# Patient Record
Sex: Male | Born: 1985 | Race: Black or African American | Hispanic: No | Marital: Married | Smoking: Current every day smoker
Health system: Southern US, Community
[De-identification: ages and names within clinical notes are randomized; demographics above are authoritative.]

## PROBLEM LIST (undated history)

## (undated) DIAGNOSIS — C8190 Hodgkin lymphoma, unspecified, unspecified site: Secondary | ICD-10-CM

## (undated) DIAGNOSIS — Z720 Tobacco use: Secondary | ICD-10-CM

## (undated) DIAGNOSIS — G8929 Other chronic pain: Secondary | ICD-10-CM

## (undated) DIAGNOSIS — Z86711 Personal history of pulmonary embolism: Secondary | ICD-10-CM

## (undated) DIAGNOSIS — F1010 Alcohol abuse, uncomplicated: Secondary | ICD-10-CM

## (undated) DIAGNOSIS — M4802 Spinal stenosis, cervical region: Secondary | ICD-10-CM

## (undated) DIAGNOSIS — M545 Low back pain, unspecified: Secondary | ICD-10-CM

## (undated) HISTORY — DX: Hodgkin lymphoma, unspecified, unspecified site: C81.90

## (undated) HISTORY — DX: Tobacco use: Z72.0

## (undated) MED FILL — Iron Sucrose Inj 20 MG/ML (Fe Equiv): INTRAVENOUS | Qty: 15 | Status: AC

## (undated) NOTE — *Deleted (*Deleted)
Doxorubicin injection °What is this medicine? °DOXORUBICIN (dox oh ROO bi sin) is a chemotherapy drug. It is used to treat many kinds of cancer like leukemia, lymphoma, neuroblastoma, sarcoma, and Wilms' tumor. It is also used to treat bladder cancer, breast cancer, lung cancer, ovarian cancer, stomach cancer, and thyroid cancer. °This medicine may be used for other purposes; ask your health care provider or pharmacist if you have questions. °COMMON BRAND NAME(S): Adriamycin, Adriamycin PFS, Adriamycin RDF, Rubex °What should I tell my health care provider before I take this medicine? °They need to know if you have any of these conditions: °· heart disease °· history of low blood counts caused by a medicine °· liver disease °· recent or ongoing radiation therapy °· an unusual or allergic reaction to doxorubicin, other chemotherapy agents, other medicines, foods, dyes, or preservatives °· pregnant or trying to get pregnant °· breast-feeding °How should I use this medicine? °This drug is given as an infusion into a vein. It is administered in a hospital or clinic by a specially trained health care professional. If you have pain, swelling, burning or any unusual feeling around the site of your injection, tell your health care professional right away. °Talk to your pediatrician regarding the use of this medicine in children. Special care may be needed. °Overdosage: If you think you have taken too much of this medicine contact a poison control center or emergency room at once. °NOTE: This medicine is only for you. Do not share this medicine with others. °What if I miss a dose? °It is important not to miss your dose. Call your doctor or health care professional if you are unable to keep an appointment. °What may interact with this medicine? °This medicine may interact with the following medications: °· 6-mercaptopurine °· paclitaxel °· phenytoin °· St. John's Wort °· trastuzumab °· verapamil °This list may not describe  all possible interactions. Give your health care provider a list of all the medicines, herbs, non-prescription drugs, or dietary supplements you use. Also tell them if you smoke, drink alcohol, or use illegal drugs. Some items may interact with your medicine. °What should I watch for while using this medicine? °This drug may make you feel generally unwell. This is not uncommon, as chemotherapy can affect healthy cells as well as cancer cells. Report any side effects. Continue your course of treatment even though you feel ill unless your doctor tells you to stop. °There is a maximum amount of this medicine you should receive throughout your life. The amount depends on the medical condition being treated and your overall health. Your doctor will watch how much of this medicine you receive in your lifetime. Tell your doctor if you have taken this medicine before. °You may need blood work done while you are taking this medicine. °Your urine may turn red for a few days after your dose. This is not blood. If your urine is dark or brown, call your doctor. °In some cases, you may be given additional medicines to help with side effects. Follow all directions for their use. °Call your doctor or health care professional for advice if you get a fever, chills or sore throat, or other symptoms of a cold or flu. Do not treat yourself. This drug decreases your body's ability to fight infections. Try to avoid being around people who are sick. °This medicine may increase your risk to bruise or bleed. Call your doctor or health care professional if you notice any unusual bleeding. °Talk to your doctor   about your risk of cancer. You may be more at risk for certain types of cancers if you take this medicine. °Do not become pregnant while taking this medicine or for 6 months after stopping it. Women should inform their doctor if they wish to become pregnant or think they might be pregnant. Men should not father a child while taking this  medicine and for 6 months after stopping it. There is a potential for serious side effects to an unborn child. Talk to your health care professional or pharmacist for more information. Do not breast-feed an infant while taking this medicine. °This medicine has caused ovarian failure in some women and reduced sperm counts in some men This medicine may interfere with the ability to have a child. Talk with your doctor or health care professional if you are concerned about your fertility. °This medicine may cause a decrease in Co-Enzyme Q-10. You should make sure that you get enough Co-Enzyme Q-10 while you are taking this medicine. Discuss the foods you eat and the vitamins you take with your health care professional. °What side effects may I notice from receiving this medicine? °Side effects that you should report to your doctor or health care professional as soon as possible: °· allergic reactions like skin rash, itching or hives, swelling of the face, lips, or tongue °· breathing problems °· chest pain °· fast or irregular heartbeat °· low blood counts - this medicine may decrease the number of white blood cells, red blood cells and platelets. You may be at increased risk for infections and bleeding. °· pain, redness, or irritation at site where injected °· signs of infection - fever or chills, cough, sore throat, pain or difficulty passing urine °· signs of decreased platelets or bleeding - bruising, pinpoint red spots on the skin, black, tarry stools, blood in the urine °· swelling of the ankles, feet, hands °· tiredness °· weakness °Side effects that usually do not require medical attention (report to your doctor or health care professional if they continue or are bothersome): °· diarrhea °· hair loss °· mouth sores °· nail discoloration or damage °· nausea °· red colored urine °· vomiting °This list may not describe all possible side effects. Call your doctor for medical advice about side effects. You may report  side effects to FDA at 1-800-FDA-1088. °Where should I keep my medicine? °This drug is given in a hospital or clinic and will not be stored at home. °NOTE: This sheet is a summary. It may not cover all possible information. If you have questions about this medicine, talk to your doctor, pharmacist, or health care provider. °© 2020 [employer] (2017-03-12 11:01:26) °Bleomycin injection °What is this medicine? °BLEOMYCIN (blee oh MYE sin) is a chemotherapy drug. It is used to treat many kinds of cancer like lymphoma, cervical cancer, head and neck cancer, and testicular cancer. It is also used to prevent and to treat fluid build-up around the lungs caused by some cancers. °This medicine may be used for other purposes; ask your health care provider or pharmacist if you have questions. °COMMON BRAND NAME(S): Blenoxane °What should I tell my health care provider before I take this medicine? °They need to know if you have any of these conditions: °· cigarette smoker °· kidney disease °· lung disease °· recent or ongoing radiation therapy °· an unusual or allergic reaction to bleomycin, other chemotherapy agents, other medicines, foods, dyes, or preservatives °· pregnant or trying to get pregnant °· breast-feeding °How should I   use this medicine? °This drug is given as an infusion into a vein or a body cavity. It can also be given as an injection into a muscle or under the skin. It is administered in a hospital or clinic by a specially trained health care professional. °Talk to your pediatrician regarding the use of this medicine in children. Special care may be needed. °Overdosage: If you think you have taken too much of this medicine contact a poison control center or emergency room at once. °NOTE: This medicine is only for you. Do not share this medicine with others. °What if I miss a dose? °It is important not to miss your dose. Call your doctor or health care professional if you are unable to keep an  appointment. °What may interact with this medicine? °· certain antibiotics given by injection °· cisplatin °· cyclosporine °· diuretics °· foscarnet °· medicines to increase blood counts like filgrastim, pegfilgrastim, sargramostim °· vaccines °This list may not describe all possible interactions. Give your health care provider a list of all the medicines, herbs, non-prescription drugs, or dietary supplements you use. Also tell them if you smoke, drink alcohol, or use illegal drugs. Some items may interact with your medicine. °What should I watch for while using this medicine? °Visit your doctor for checks on your progress. This drug may make you feel generally unwell. This is not uncommon, as chemotherapy can affect healthy cells as well as cancer cells. Report any side effects. Continue your course of treatment even though you feel ill unless your doctor tells you to stop. °Call your doctor or health care professional for advice if you get a fever, chills or sore throat, or other symptoms of a cold or flu. Do not treat yourself. This drug decreases your body's ability to fight infections. Try to avoid being around people who are sick. °Avoid taking products that contain aspirin, acetaminophen, ibuprofen, naproxen, or ketoprofen unless instructed by your doctor. These medicines may hide a fever. °Do not become pregnant while taking this medicine. Women should inform their doctor if they wish to become pregnant or think they might be pregnant. There is a potential for serious side effects to an unborn child. Talk to your health care professional or pharmacist for more information. Do not breast-feed an infant while taking this medicine. °There is a maximum amount of this medicine you should receive throughout your life. The amount depends on the medical condition being treated and your overall health. Your doctor will watch how much of this medicine you receive in your lifetime. Tell your doctor if you have taken  this medicine before. °What side effects may I notice from receiving this medicine? °Side effects that you should report to your doctor or health care professional as soon as possible: °· allergic reactions like skin rash, itching or hives, swelling of the face, lips, or tongue °· breathing problems °· chest pain °· confusion °· cough °· fast, irregular heartbeat °· feeling faint or lightheaded, falls °· fever or chills °· mouth sores °· pain, tingling, numbness in the hands or feet °· trouble passing urine or change in the amount of urine °· yellowing of the eyes or skin °Side effects that usually do not require medical attention (report to your doctor or health care professional if they continue or are bothersome): °· darker skin color °· hair loss °· irritation at site where injected °· loss of appetite °· nail changes °· nausea and vomiting °· weight loss °This list may not describe   all possible side effects. Call your doctor for medical advice about side effects. You may report side effects to FDA at 1-800-FDA-1088. °Where should I keep my medicine? °This drug is given in a hospital or clinic and will not be stored at home. °NOTE: This sheet is a summary. It may not cover all possible information. If you have questions about this medicine, talk to your doctor, pharmacist, or health care provider. °© 2020 [employer] (2012-11-24 09:36:48) °Vinblastine injection °What is this medicine? °VINBLASTINE (vin BLAS teen) is a chemotherapy drug. It slows the growth of cancer cells. This medicine is used to treat many types of cancer like breast cancer, testicular cancer, Hodgkin's disease, non-Hodgkin's lymphoma, and sarcoma. °This medicine may be used for other purposes; ask your health care provider or pharmacist if you have questions. °COMMON BRAND NAME(S): Velban °What should I tell my health care provider before I take this medicine? °They need to know if you have any of these conditions: °· blood  disorders °· dental disease °· gout °· infection (especially a virus infection such as chickenpox, cold sores, or herpes) °· liver disease °· lung disease °· nervous system disease °· recent or ongoing radiation therapy °· an unusual or allergic reaction to vinblastine, other chemotherapy agents, other medicines, foods, dyes, or preservatives °· pregnant or trying to get pregnant °· breast-feeding °How should I use this medicine? °This drug is given as an infusion into a vein. It is administered in a hospital or clinic by a specially trained health care professional. If you have pain, swelling, burning or any unusual feeling around the site of your injection, tell your health care professional right away. °Talk to your pediatrician regarding the use of this medicine in children. While this drug may be prescribed for selected conditions, precautions do apply. °Overdosage: If you think you have taken too much of this medicine contact a poison control center or emergency room at once. °NOTE: This medicine is only for you. Do not share this medicine with others. °What if I miss a dose? °It is important not to miss your dose. Call your doctor or health care professional if you are unable to keep an appointment. °What may interact with this medicine? °Do not take this medicine with any of the following medications: °· erythromycin °· itraconazole °· mibefradil °· voriconazole °This medicine may also interact with the following medications: °· cyclosporine °· fluconazole °· ketoconazole °· medicines for seizures like phenytoin °· medicines to increase blood counts like filgrastim, pegfilgrastim, sargramostim °· vaccines °· verapamil °Talk to your doctor or health care professional before taking any of these medicines: °· acetaminophen °· aspirin °· ibuprofen °· ketoprofen °· naproxen °This list may not describe all possible interactions. Give your health care provider a list of all the medicines, herbs, non-prescription  drugs, or dietary supplements you use. Also tell them if you smoke, drink alcohol, or use illegal drugs. Some items may interact with your medicine. °What should I watch for while using this medicine? °Your condition will be monitored carefully while you are receiving this medicine. You will need important blood work done while you are taking this medicine. °This drug may make you feel generally unwell. This is not uncommon, as chemotherapy can affect healthy cells as well as cancer cells. Report any side effects. Continue your course of treatment even though you feel ill unless your doctor tells you to stop. °In some cases, you may be given additional medicines to help with side effects. Follow all   directions for their use. °Call your doctor or health care professional for advice if you get a fever, chills or sore throat, or other symptoms of a cold or flu. Do not treat yourself. This drug decreases your body's ability to fight infections. Try to avoid being around people who are sick. °This medicine may increase your risk to bruise or bleed. Call your doctor or health care professional if you notice any unusual bleeding. °Be careful brushing and flossing your teeth or using a toothpick because you may get an infection or bleed more easily. If you have any dental work done, tell your dentist you are receiving this medicine. °Avoid taking products that contain aspirin, acetaminophen, ibuprofen, naproxen, or ketoprofen unless instructed by your doctor. These medicines may hide a fever. °Do not become pregnant while taking this medicine. Women should inform their doctor if they wish to become pregnant or think they might be pregnant. There is a potential for serious side effects to an unborn child. Talk to your health care professional or pharmacist for more information. Do not breast-feed an infant while taking this medicine. °Men may have a lower sperm count while taking this medicine. Talk to your doctor if you  plan to father a child. °What side effects may I notice from receiving this medicine? °Side effects that you should report to your doctor or health care professional as soon as possible: °· allergic reactions like skin rash, itching or hives, swelling of the face, lips, or tongue °· low blood counts - This drug may decrease the number of white blood cells, red blood cells and platelets. You may be at increased risk for infections and bleeding. °· signs of infection - fever or chills, cough, sore throat, pain or difficulty passing urine °· signs of decreased platelets or bleeding - bruising, pinpoint red spots on the skin, black, tarry stools, nosebleeds °· signs of decreased red blood cells - unusually weak or tired, fainting spells, lightheadedness °· breathing problems °· changes in hearing °· change in the amount of urine °· chest pain °· high blood pressure °· mouth sores °· nausea and vomiting °· pain, swelling, redness or irritation at the injection site °· pain, tingling, numbness in the hands or feet °· problems with balance, dizziness °· seizures °Side effects that usually do not require medical attention (report to your doctor or health care professional if they continue or are bothersome): °· constipation °· hair loss °· jaw pain °· loss of appetite °· sensitivity to light °· stomach pain °· tumor pain °This list may not describe all possible side effects. Call your doctor for medical advice about side effects. You may report side effects to FDA at 1-800-FDA-1088. °Where should I keep my medicine? °This drug is given in a hospital or clinic and will not be stored at home. °NOTE: This sheet is a summary. It may not cover all possible information. If you have questions about this medicine, talk to your doctor, pharmacist, or health care provider. °© 2020 [employer] (2008-04-25 17:15:59) °Dacarbazine, DTIC injection °What is this medicine? °DACARBAZINE (da KAR ba zeen) is a chemotherapy drug.  This medicine is used to treat skin cancer. It is also used with other medicines to treat Hodgkin's disease. °This medicine may be used for other purposes; ask your health care provider or pharmacist if you have questions. °COMMON BRAND NAME(S): DTIC-Dome °What should I tell my health care provider before I take this medicine? °They need to know if you have any of   these conditions: °· infection (especially virus infection such as chickenpox, cold sores, or herpes) °· kidney disease °· liver disease °· low blood counts like low platelets, red blood cells, white blood cells °· recent radiation therapy °· an unusual or allergic reaction to dacarbazine, other chemotherapy agents, other medicines, foods, dyes, or preservatives °· pregnant or trying to get pregnant °· breast-feeding °How should I use this medicine? °This drug is given as an injection or infusion into a vein. It is administered in a hospital or clinic by a specially trained health care professional. °Talk to your pediatrician regarding the use of this medicine in children. While this drug may be prescribed for selected conditions, precautions do apply. °Overdosage: If you think you have taken too much of this medicine contact a poison control center or emergency room at once. °NOTE: This medicine is only for you. Do not share this medicine with others. °What if I miss a dose? °It is important not to miss your dose. Call your doctor or health care professional if you are unable to keep an appointment. °What may interact with this medicine? °· medicines to increase blood counts like filgrastim, pegfilgrastim, sargramostim °· vaccines °This list may not describe all possible interactions. Give your health care provider a list of all the medicines, herbs, non-prescription drugs, or dietary supplements you use. Also tell them if you smoke, drink alcohol, or use illegal drugs. Some items may interact with your medicine. °What should I watch for while using this  medicine? °Your condition will be monitored carefully while you are receiving this medicine. You will need important blood work done while you are taking this medicine. °This drug may make you feel generally unwell. This is not uncommon, as chemotherapy can affect healthy cells as well as cancer cells. Report any side effects. Continue your course of treatment even though you feel ill unless your doctor tells you to stop. °Call your doctor or health care professional for advice if you get a fever, chills or sore throat, or other symptoms of a cold or flu. Do not treat yourself. This drug decreases your body's ability to fight infections. Try to avoid being around people who are sick. °This medicine may increase your risk to bruise or bleed. Call your doctor or health care professional if you notice any unusual bleeding. °Talk to your doctor about your risk of cancer. You may be more at risk for certain types of cancers if you take this medicine. °Do not become pregnant while taking this medicine. Women should inform their doctor if they wish to become pregnant or think they might be pregnant. There is a potential for serious side effects to an unborn child. Talk to your health care professional or pharmacist for more information. Do not breast-feed an infant while taking this medicine. °What side effects may I notice from receiving this medicine? °Side effects that you should report to your doctor or health care professional as soon as possible: °· allergic reactions like skin rash, itching or hives, swelling of the face, lips, or tongue °· low blood counts - this medicine may decrease the number of white blood cells, red blood cells and platelets. You may be at increased risk for infections and bleeding. °· signs of infection - fever or chills, cough, sore throat, pain or difficulty passing urine °· signs of decreased platelets or bleeding - bruising, pinpoint red spots on the skin, black, tarry stools, blood in  the urine °· signs of decreased red blood cells -   unusually weak or tired, fainting spells, lightheadedness °· breathing problems °· muscle pains °· pain at site where injected °· trouble passing urine or change in the amount of urine °· vomiting °· yellowing of the eyes or skin °Side effects that usually do not require medical attention (report to your doctor or health care professional if they continue or are bothersome): °· diarrhea °· hair loss °· loss of appetite °· nausea °· skin more sensitive to sun or ultraviolet light °· stomach upset °This list may not describe all possible side effects. Call your doctor for medical advice about side effects. You may report side effects to FDA at 1-800-FDA-1088. °Where should I keep my medicine? °This drug is given in a hospital or clinic and will not be stored at home. °NOTE: This sheet is a summary. It may not cover all possible information. If you have questions about this medicine, talk to your doctor, pharmacist, or health care provider. °© 2020 [employer] (2015-09-29 15:17:39) ° °

---

## 2019-12-30 DIAGNOSIS — C8190 Hodgkin lymphoma, unspecified, unspecified site: Secondary | ICD-10-CM | POA: Insufficient documentation

## 2020-05-17 ENCOUNTER — Other Ambulatory Visit: Payer: Self-pay

## 2020-05-17 ENCOUNTER — Ambulatory Visit
Admission: RE | Admit: 2020-05-17 | Discharge: 2020-05-17 | Disposition: A | Discharge status: Home / Self Care | Payer: Self-pay | Source: Ambulatory Visit | Attending: Oncology | Admitting: Oncology

## 2020-05-17 ENCOUNTER — Inpatient Hospital Stay

## 2020-05-17 ENCOUNTER — Inpatient Hospital Stay: Payer: Self-pay | Attending: Oncology | Admitting: Oncology

## 2020-05-17 ENCOUNTER — Encounter: Payer: Self-pay | Admitting: Oncology

## 2020-05-17 VITALS — BP 129/85 | HR 92 | Temp 98.3°F | Resp 18 | Wt 166.5 lb

## 2020-05-17 DIAGNOSIS — C8172 Other classical Hodgkin lymphoma, intrathoracic lymph nodes: Secondary | ICD-10-CM

## 2020-05-17 DIAGNOSIS — C8192 Hodgkin lymphoma, unspecified, intrathoracic lymph nodes: Secondary | ICD-10-CM

## 2020-05-17 DIAGNOSIS — Z7189 Other specified counseling: Secondary | ICD-10-CM

## 2020-05-17 DIAGNOSIS — Z720 Tobacco use: Secondary | ICD-10-CM | POA: Insufficient documentation

## 2020-05-17 LAB — COMPREHENSIVE METABOLIC PANEL
ALT: 23 U/L (ref 0–44)
AST: 30 U/L (ref 15–41)
Albumin: 4.2 g/dL (ref 3.5–5.0)
Alkaline Phosphatase: 47 U/L (ref 38–126)
Anion gap: 12 (ref 5–15)
BUN: 15 mg/dL (ref 6–20)
CO2: 26 mmol/L (ref 22–32)
Calcium: 9.1 mg/dL (ref 8.9–10.3)
Chloride: 102 mmol/L (ref 98–111)
Creatinine, Ser: 0.85 mg/dL (ref 0.61–1.24)
GFR calc non Af Amer: >60 mL/min (ref >60)
Glucose, Bld: 91 mg/dL (ref 70–99)
Potassium: 3.9 mmol/L (ref 3.5–5.1)
Sodium: 140 mmol/L (ref 135–145)
Total Bilirubin: 0.8 mg/dL (ref 0.3–1.2)
Total Protein: 7.8 g/dL (ref 6.5–8.1)

## 2020-05-17 LAB — CBC WITH DIFFERENTIAL/PLATELET
Abs Immature Granulocytes: 0.04 10*3/uL (ref 0.00–0.07)
Basophils Absolute: 0 10*3/uL (ref 0.0–0.1)
Basophils Relative: 0 %
Eosinophils Absolute: 0 10*3/uL (ref 0.0–0.5)
Eosinophils Relative: 0 %
HCT: 34.9 % — ABNORMAL LOW (ref 39.0–52.0)
Hemoglobin: 13.1 g/dL (ref 13.0–17.0)
Immature Granulocytes: 1 %
Lymphocytes Relative: 17 %
Lymphs Abs: 1.4 10*3/uL (ref 0.7–4.0)
MCH: 31.7 pg (ref 26.0–34.0)
MCHC: 37.5 g/dL — ABNORMAL HIGH (ref 30.0–36.0)
MCV: 84.5 fL (ref 80.0–100.0)
Monocytes Absolute: 1.1 10*3/uL — ABNORMAL HIGH (ref 0.1–1.0)
Monocytes Relative: 13 %
Neutro Abs: 5.9 10*3/uL (ref 1.7–7.7)
Neutrophils Relative %: 69 %
Platelets: 298 10*3/uL (ref 150–400)
RBC: 4.13 MIL/uL — ABNORMAL LOW (ref 4.22–5.81)
RDW: 15.2 % (ref 11.5–15.5)
WBC: 8.5 10*3/uL (ref 4.0–10.5)
nRBC: 0 % (ref 0.0–0.2)

## 2020-05-17 LAB — HEPATITIS PANEL, ACUTE
HCV Ab: NONREACTIVE
Hep A IgM: NONREACTIVE
Hep B C IgM: NONREACTIVE
Hepatitis B Surface Ag: NONREACTIVE

## 2020-05-17 LAB — HIV ANTIBODY (ROUTINE TESTING W REFLEX): HIV Screen 4th Generation wRfx: NONREACTIVE

## 2020-05-17 NOTE — Progress Notes (Signed)
Patient here to establish care for Hodgkin Lymphoma. Previously had treatment at [facility]. Pt reports having last treatment around 9/2. Was suppose to have final tx around 10/15, but did not receive it because he had to go out of town for family emergency.

## 2020-05-17 NOTE — Progress Notes (Signed)
START ON PATHWAY REGIMEN - Lymphoma and CLL     A cycle is every 28 days:     Bleomycin      Dacarbazine      Doxorubicin      Vinblastine   **Always confirm dose/schedule in your pharmacy ordering system**  Patient Characteristics: Classical Hodgkin Lymphoma, First Line, Stage III / IV, Age < 60 Disease Type: Not Applicable Disease Type: Not Applicable Disease Type: Classical Hodgkin Lymphoma Line of therapy: First Line Age: < 60 Intent of Therapy: Curative Intent, Discussed with Patient 

## 2020-05-17 NOTE — Progress Notes (Signed)
Hematology/Oncology Consult note [facility] [phone] [phone]   Patient Care Team: Patient, No Pcp Per as PCP - General (General Practice)  REFERRING PROVIDER: No ref. provider found  CHIEF COMPLAINTS/REASON FOR VISIT:  Evaluation of hodgkin's lymphoma.   HISTORY OF PRESENTING ILLNESS:   Preston [name] is a  34 y.o.  male with PMH listed below was seen in consultation at the request of  No ref. provider found  for evaluation of Hodgkin's lymphoma.  Patient has a known diagnosis of Hodgkin's lymphoma and wants to transfer his oncology care to our cancer center.  Extensive medical records reviewed was performed  Previous oncology care was [facility] Per note  10/28/2019 biopsy of right chest wall mass, fibromuscular soft tissue with atypical infiltrate consistent with Hodgkin lymphoma, classical type. There were scattered single atypical cells with morphology suggestive of Jaclynn Guarneri variant cells. These cells were reactive with CD 45, CD 15, CD 30 and nonreactive for pan keratin, cam 5.7, CD3, CD5, CD10, CD 20, melan-A, Sox 10. Ki-67 stained majority of Reed-Sternberg like cells.  Noticed right upper chest wall lump in January 2021. He was incarcerated for 27 months, released in May 2021.  Unintentional weight loss about 30 pounds, drenching night sweats.  01/17/2020 PET scan showed bulky anterior mediastinal mass 12.7cm. with extensive adenopathy in neck, right axilla, mediastinum, bilateral hilar regions, upper abdominal ligament adenopathy and retroperitoneal adenopathy. Abnormal hypermetabolic activity within a splenic focus. Mild patchy ground glass opacity in the right lower lobe may reflect postobstructive inflammatory change without hypermetabolic activity.   01/17/2020 Echo 1. Normal biventricular systolic function and size. LVEF 60%. Averageglobal longitudinal strain is normal, -20%  2. No significant  valvular dysfunction.  3. Normal right-and left-sided filling pressures. 4. No prior study for comparison.  12/30/2019 LDH 588 01/24/2020 CRP 67.2, ESR 60  Stage IIIB Hodgkin's lymphoma, he was recommended ABVD x 2 cycles, followed by PET with plan of AVD x 4 or escalate to BEACOPP if not favorable results.   01/26/2020 ABVD Day 1,15 x 2 cycles.   03/24/2020 PET scan showed significant interval response to therapy. All PET positive lesions previously  03/30/2020 AVD D1 04/13/2020 AVD D15  He was not able to have chest medi port placed due to chest mass. He has central line placed on left upper extremity.   Today he was accompanied by his girlfriend.  Denies weight loss, fever, chills, fatigue, night sweats.   Review of Systems  Constitutional: Negative for appetite change, chills, fatigue, fever and unexpected weight change.  HENT:   Negative for hearing loss and voice change.   Eyes: Negative for eye problems and icterus.  Respiratory: Negative for chest tightness, cough and shortness of breath.   Cardiovascular: Negative for chest pain and leg swelling.  Gastrointestinal: Negative for abdominal distention and abdominal pain.  Endocrine: Negative for hot flashes.  Genitourinary: Negative for difficulty urinating, dysuria and frequency.   Musculoskeletal: Negative for arthralgias.  Skin: Negative for itching and rash.  Neurological: Negative for light-headedness and numbness.  Hematological: Negative for adenopathy. Does not bruise/bleed easily.  Psychiatric/Behavioral: Negative for confusion.    MEDICAL HISTORY:  Past Medical History:  Diagnosis Date  . Hodgkin's lymphoma ([place])   . Tobacco use     SURGICAL HISTORY: History reviewed. No pertinent surgical history.  SOCIAL HISTORY: Social History   Socioeconomic History  . Marital status: Unknown    Spouse name: Not on file  . Number of  children: Not on file  . Years of education: Not on file  . Highest education level:  Not on file  Occupational History  . Not on file  Tobacco Use  . Smoking status: Current Every Day Smoker    Packs/day: 0.50    Years: 20.00    Pack years: 10.00  . Smokeless tobacco: Never Used  Vaping Use  . Vaping Use: Never used  Substance and Sexual Activity  . Alcohol use: Yes  . Drug use: Not Currently  . Sexual activity: Not on file  Other Topics Concern  . Not on file  Social History Narrative  . Not on file   Social Determinants of Health   Financial Resource Strain:   . Difficulty of Paying Living Expenses: Not on file  Food Insecurity:   . Worried About Charity fundraiser in the Last Year: Not on file  . Ran Out of Food in the Last Year: Not on file  Transportation Needs:   . Lack of Transportation (Medical): Not on file  . Lack of Transportation (Non-Medical): Not on file  Physical Activity:   . Days of Exercise per Week: Not on file  . Minutes of Exercise per Session: Not on file  Stress:   . Feeling of Stress : Not on file  Social Connections:   . Frequency of Communication with Friends and Family: Not on file  . Frequency of Social Gatherings with Friends and Family: Not on file  . Attends Religious Services: Not on file  . Active Member of Clubs or Organizations: Not on file  . Attends Archivist Meetings: Not on file  . Marital Status: Not on file  Intimate Partner Violence:   . Fear of Current or Ex-Partner: Not on file  . Emotionally Abused: Not on file  . Physically Abused: Not on file  . Sexually Abused: Not on file    FAMILY HISTORY: Family History  Problem Relation Age of Onset  . Heart failure Mother   . Pneumonia Father     ALLERGIES:  has no allergies on file.  MEDICATIONS:  Current Outpatient Medications  Medication Sig Dispense Refill  . dexamethasone (DECADRON) 4 MG tablet Take by mouth.    . lidocaine-prilocaine (EMLA) cream Apply topically.    . ondansetron (ZOFRAN) 8 MG tablet Take by mouth.    .  prochlorperazine (COMPAZINE) 10 MG tablet Take by mouth.    . traMADol (ULTRAM) 50 MG tablet Take 100 mg by mouth 2 (two) times daily as needed.     No current facility-administered medications for this visit.     PHYSICAL EXAMINATION: ECOG PERFORMANCE STATUS: 0 - Asymptomatic Vitals:   05/17/20 1521  BP: 129/85  Pulse: 92  Resp: 18  Temp: 98.3 F (36.8 C)   Filed Weights   05/17/20 1521  Weight: 166 lb 8 oz (75.5 kg)    Physical Exam Constitutional:      General: He is not in acute distress. HENT:     Head: Normocephalic and atraumatic.  Eyes:     General: No scleral icterus. Cardiovascular:     Rate and Rhythm: Normal rate and regular rhythm.     Heart sounds: Normal heart sounds.  Pulmonary:     Effort: Pulmonary effort is normal. No respiratory distress.     Breath sounds: No wheezing.  Abdominal:     General: Bowel sounds are normal. There is no distension.     Palpations: Abdomen is soft.  Musculoskeletal:  General: No deformity. Normal range of motion.     Cervical back: Normal range of motion and neck supple.  Skin:    General: Skin is warm and dry.     Findings: No erythema or rash.  Neurological:     Mental Status: He is alert and oriented to person, place, and time. Mental status is at baseline.     Cranial Nerves: No cranial nerve deficit.     Coordination: Coordination normal.  Psychiatric:        Mood and Affect: Mood normal.   left arm central line access     LABORATORY DATA:  I have reviewed the data as listed Lab Results  Component Value Date   WBC 8.5 05/17/2020   HGB 13.1 05/17/2020   HCT 34.9 (L) 05/17/2020   MCV 84.5 05/17/2020   PLT 298 05/17/2020   Recent Labs    05/17/20 1601  NA 140  K 3.9  CL 102  CO2 26  GLUCOSE 91  BUN 15  CREATININE 0.85  CALCIUM 9.1  GFRNONAA >60  PROT 7.8  ALBUMIN 4.2  AST 30  ALT 23  ALKPHOS 47  BILITOT 0.8   Iron/TIBC/Ferritin/ %Sat No results found for: IRON, TIBC, FERRITIN,  IRONPCTSAT    RADIOGRAPHIC STUDIES: I have personally reviewed the radiological images as listed and agreed with the findings in the report. No results found.    ASSESSMENT & PLAN:  1. 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 Tobacco use   3. Goals of care, counseling/discussion    # Hodgkin's lymphoma, classic type Per previous Oncologist note, S/p 2 cycles of ABVD with favorable PET outcome ([place] 3), and then 1 cycle of AVD.  Will obtain PET images to be uploaded to our system for future comparison.  Obtain pathology slides to be sent to our pathology department for review.  Echo was done prior to his chemotherapy at previous facility. No clinical signs of heart failure.  Plan additional AVD to finish total of 4 cycles of AVD.   He has central line access on his left upper arm. Will refer to Dr.Dew to evaluate if this line is suitable for chemotherapy use/ place medi port.   The goal of treatment which is with curative intent.  Chemotherapy education was provided.  We had discussed the composition of chemotherapy regimen, length of chemo cycle, duration of treatment and the time to assess response to treatment.  I explained to the patient the risks and benefits of chemotherapy including all but not limited to hair loss, alopecia, GI toxicity such as nausea, vomiting, diarrhea, constipation, fatigue, weakness, peripheral neuropathy and impairment of mobility and balance, risk for myelosuppression including risk of infection, need for blood and blood product transfusion. Risk of cardiac toxicity, neurotoxicity, pulmonary toxicity, risk of sterility, secondary malignancy. Risk of secondary leukemia and risk of death from effects of chemotherapy. Patient voices understanding and willing to proceed chemotherapy. Patient was advised to use condom. Patient affirms that he does not plan to have any more children.   # Chemotherapy education; Antiemetics-Zofran  and Compazine; EMLA cream will be sent to pharmacy Check cbc cmp HIV, hepatitis panel.  Supportive care measures are necessary for patient well-being and will be provided as necessary. We spent sufficient time to discuss many aspect of care, questions were answered to patient's satisfaction.   Orders Placed This Encounter  Procedures  . NM OUTSIDE FILMS BODY/ABD/PELVIS    Standing Status:   Future  Number of Occurrences:   1    Standing Expiration Date:   05/17/2021    Order Specific Question:   Where was this CD imported?    Answer:   [place] BODY/ABD/PELVIS    Standing Status:   Future    Number of Occurrences:   1    Standing Expiration Date:   05/17/2021    Order Specific Question:   Where was this CD imported?    Answer:    [facility]  . CBC with Differential/Platelet    Standing Status:   Future    Number of Occurrences:   1    Standing Expiration Date:   05/17/2021  . Comprehensive metabolic panel    Standing Status:   Future    Number of Occurrences:   1    Standing Expiration Date:   05/17/2021  . Hepatitis panel, acute    Standing Status:   Future    Number of Occurrences:   1    Standing Expiration Date:   05/17/2021  . HIV Antibody (routine testing w rflx)    Standing Status:   Future    Number of Occurrences:   1    Standing Expiration Date:   05/17/2021    All questions were answered. The patient knows to call the clinic with any problems questions or concerns.  Return of visit: to be determined.  Thank you for this kind referral and the opportunity to participate in the care of this patient. A copy of today's note is routed to referring provider    Earlie Server, MD, PhD Hematology Oncology [facility] at [facility] Pager- [phone] 05/17/2020

## 2020-05-18 ENCOUNTER — Other Ambulatory Visit: Payer: Self-pay

## 2020-05-18 ENCOUNTER — Telehealth: Payer: Self-pay

## 2020-05-18 DIAGNOSIS — C8172 Other classical Hodgkin lymphoma, intrathoracic lymph nodes: Secondary | ICD-10-CM

## 2020-05-18 NOTE — Telephone Encounter (Signed)
Per [place] messages from Dr. Tasia Catchings & Dr. Lucky Cowboy:  Dr. Tasia Catchings: Dr.Dew, this is a patient who has hodgkin's lymphoma who is transferring care to [facility] [place] [place] .  previously due to chest wall mass, a chest medi port was not able to be placed. he had a left upper arm central line. he calls it a port, but maybe a picc line? i took a picture and attached to my note. can I send him to you for evaluation of that central line? i want to make sure that it is appropriate to [place]. I have never seen this kind of access.  Dr. Lucky Cowboy:  That does actually appear to be a port.  Those are usually only placed in the extremity if there is a central venous occlusion so I am not sure where the termination of the catheter is.  Would be helpful to get a chest x-ray for evaluation of this.  I would be happy to see him in the office if you would like.  Referral for vascular has been placed. Order for Chest xray has been entered. Attempted to call pt to notify him,but he did not answer, unable to leave voicemail. Will try call again later.

## 2020-05-19 ENCOUNTER — Telehealth: Payer: Self-pay

## 2020-05-19 DIAGNOSIS — C8172 Other classical Hodgkin lymphoma, intrathoracic lymph nodes: Secondary | ICD-10-CM

## 2020-05-19 NOTE — Telephone Encounter (Signed)
Attempted to call pt this morning again with no luck. Contacted patient's significant other , Erica, and notified her of referral to vascular and the need for a chest xray. She voiced understanding.

## 2020-05-19 NOTE — Telephone Encounter (Signed)
Request for path block and slides on specimen [id number], was faxed to [facility] center. Received fax from their pathology lab that no slides were available, only Flow cytometry.   Spoke to [place] at [facility] path lab and she states that there were no slides. I told her that path result reports that Chest wall mass tissue was biopsied. She verified with tech, who told her that there was no tissue available and that only fluid was drawn from chest wall mass. MD notified of this.    [facility] path lab  [place]: [phone] Fax: [phone]

## 2020-05-23 ENCOUNTER — Inpatient Hospital Stay: Admitting: Nurse Practitioner

## 2020-05-23 ENCOUNTER — Inpatient Hospital Stay

## 2020-05-31 ENCOUNTER — Other Ambulatory Visit: Payer: Self-pay

## 2020-05-31 ENCOUNTER — Inpatient Hospital Stay (HOSPITAL_BASED_OUTPATIENT_CLINIC_OR_DEPARTMENT_OTHER): Payer: Self-pay | Admitting: Hospice and Palliative Medicine

## 2020-05-31 DIAGNOSIS — C8172 Other classical Hodgkin lymphoma, intrathoracic lymph nodes: Secondary | ICD-10-CM

## 2020-05-31 NOTE — Progress Notes (Signed)
Multidisciplinary Oncology Council Documentation  Preston [name] was presented by our [facility] on 05/31/2020, which included representatives from:  . Palliative Care . Dietitian . Physical/Occupational Therapist . Speech Therapist . Survivorship Nurse . Nurse Navigator . Research RN . Genetics    Preston [name] currently presents with history of Hodgkin's lymphoma  We reviewed previous medical and familial history, history of present illness, and recent lab results along with all available histopathologic and imaging studies. The Preston [name] considered available treatment options and made the following recommendations/referrals: Orders Placed This Encounter  Procedures  . Amb Referral to Nutrition and Diabetic Education    The MOC is a meeting of clinicians from various specialty areas who evaluate and discuss patients for whom a multidisciplinary approach is being considered. Final determinations in the plan of care are those of the provider(s).   Today's extended care, comprehensive team conference, Preston [name] was not present for the discussion and was not examined.

## 2020-05-31 NOTE — H&P (View-Only) (Signed)
Multidisciplinary Oncology Council Documentation  Preston [name] was presented by our [facility] on 05/31/2020, which included representatives from:   Palliative Care  Dietitian  Physical/Occupational Therapist  Speech Therapist  Survivorship Nurse  Nurse Navigator  Research RN  Genetics    Preston [name] currently presents with history of Hodgkin's lymphoma  We reviewed previous medical and familial history, history of present illness, and recent lab results along with all available histopathologic and imaging studies. The [place] considered available treatment options and made the following recommendations/referrals: Orders Placed This Encounter  Procedures   Amb Referral to Nutrition and Diabetic Education    The MOC is a meeting of clinicians from various specialty areas who evaluate and discuss patients for whom a multidisciplinary approach is being considered. Final determinations in the plan of care are those of the provider(s).   Todays extended care, comprehensive team conference, Preston [name] was not present for the discussion and was not examined.

## 2020-06-01 ENCOUNTER — Other Ambulatory Visit: Payer: Self-pay

## 2020-06-01 ENCOUNTER — Inpatient Hospital Stay: Payer: Self-pay | Admitting: Nurse Practitioner

## 2020-06-01 ENCOUNTER — Inpatient Hospital Stay: Payer: Self-pay

## 2020-06-02 NOTE — Telephone Encounter (Signed)
Called patient and SO, Danae Chen, answered and said patient was not with him. I reminded her about patient needing chest xray and she said that she will take him next week. I also provided her with Dr. Bunnie Domino office number so they can call and schedule appt for port evaluation. Referral was sent to Dr. Bunnie Domino office, but looks like office has not been able to reach patient.

## 2020-06-06 NOTE — Addendum Note (Signed)
Addended by: Evelina Dun on: 06/06/2020 04:41 PM   Modules accepted: Orders

## 2020-06-06 NOTE — Telephone Encounter (Addendum)
Dr. Tasia Catchings contacted [facility] pathologist via [facility] regarding this concern of there being no slides and per Betsy Pries, MD: if the biopsy shows fibromuscular soft tissue and the morphology looks like RS cells (and they obviously performed immunohistochemistry), there must be slides. Hodgkin lymphoma is not a diagnosis that can be made on only flow cytometry  secure chat from Bryan Lemma, MD:  [place] is the group that provides pathology services for [facility]. I will try calling them directly with the possible accession number and see if they will send the slides to [place].  [employer] is supposed to fax the report to me now and I will send it along if it shows up. To get the slides sent to you at the [facility], please FAX the slide request paperwork to [phone]. You can specify that [id number] is the case number for the slides you are requesting. They mentioned including a FedEx label.  The person I spoke with at [facility] is Levada Dy, [phone] if you need to get in touch directly

## 2020-06-06 NOTE — Telephone Encounter (Signed)
Slide request paperwork faxed to [facility] associates for specimen ID [id number].  Slide consult order has been entered in Epic.   contact person at [employer]: Levada Dy phone: [phone] fax: [phone]

## 2020-06-08 NOTE — Telephone Encounter (Signed)
14 Slides received from [employer] [facility].  FEDEX envelope with slides taken to histology lab and given to [facility]. Path report and letter with expected return date included in envelope.

## 2020-06-13 ENCOUNTER — Telehealth (INDEPENDENT_AMBULATORY_CARE_PROVIDER_SITE_OTHER): Payer: Self-pay

## 2020-06-13 NOTE — Telephone Encounter (Signed)
Spoke with the patient's emergency contact and the patient is scheduled for a port placement with Dr. Lucky Cowboy om 06/19/20 with a 8:45 am arrival time to the [facility]. Covid testing on 06/15/20 between 8-1 pm at the [place]. Pre-procedure instructions were discussed and will be mailed.

## 2020-06-15 ENCOUNTER — Other Ambulatory Visit
Admission: RE | Admit: 2020-06-15 | Discharge: 2020-06-15 | Disposition: A | Discharge status: Home / Self Care | Payer: HRSA Program | Source: Ambulatory Visit | Attending: Vascular Surgery | Admitting: Vascular Surgery

## 2020-06-15 ENCOUNTER — Other Ambulatory Visit: Payer: Self-pay

## 2020-06-15 DIAGNOSIS — Z01812 Encounter for preprocedural laboratory examination: Secondary | ICD-10-CM | POA: Diagnosis present

## 2020-06-15 DIAGNOSIS — Z20822 Contact with and (suspected) exposure to covid-19: Secondary | ICD-10-CM | POA: Diagnosis not present

## 2020-06-15 LAB — SLIDE CONSULT, PATHOLOGY ARMC

## 2020-06-16 ENCOUNTER — Telehealth: Payer: Self-pay

## 2020-06-16 LAB — SARS CORONAVIRUS 2 (TAT 6-24 HRS): SARS Coronavirus 2: NEGATIVE

## 2020-06-16 NOTE — Telephone Encounter (Signed)
Done.. Appt for lab/MD/ AVD *new  Has been scheduled as requested. Called and made pts ([place]) Erika aware of his scheduled appt date and time.

## 2020-06-16 NOTE — Telephone Encounter (Signed)
-----   Message from Earlie Server, MD sent at 06/15/2020  4:12 PM EDT ----- Path consult is done.  Please schedule lab md AVD after his medi port is placed.

## 2020-06-16 NOTE — Telephone Encounter (Signed)
Patient scheduled to get medi port on 11/8. Please schedule patient for lab/MD/ AVD *new here, previously received at other facility*. Please call patient with appointments.

## 2020-06-19 ENCOUNTER — Ambulatory Visit
Admission: RE | Admit: 2020-06-19 | Discharge: 2020-06-19 | Disposition: A | Discharge status: Home / Self Care | Payer: Self-pay | Attending: Vascular Surgery | Admitting: Vascular Surgery

## 2020-06-19 ENCOUNTER — Ambulatory Visit: Payer: Self-pay

## 2020-06-19 ENCOUNTER — Other Ambulatory Visit (INDEPENDENT_AMBULATORY_CARE_PROVIDER_SITE_OTHER): Payer: Self-pay | Admitting: Nurse Practitioner

## 2020-06-19 ENCOUNTER — Encounter
Admission: RE | Disposition: A | Discharge status: Home / Self Care | Payer: Self-pay | Source: Home / Self Care | Attending: Vascular Surgery

## 2020-06-19 DIAGNOSIS — Z95828 Presence of other vascular implants and grafts: Secondary | ICD-10-CM

## 2020-06-19 DIAGNOSIS — Z452 Encounter for adjustment and management of vascular access device: Secondary | ICD-10-CM

## 2020-06-19 DIAGNOSIS — C8190 Hodgkin lymphoma, unspecified, unspecified site: Secondary | ICD-10-CM | POA: Insufficient documentation

## 2020-06-19 IMAGING — DX DG CHEST 1V PORT
1 series · 1 of 1 positions shown · non-contrast
Comparison: None.

CLINICAL DATA: Hodgkin's lymphoma. Preop for Port-A-Cath placement.

EXAM:
PORTABLE CHEST 1 VIEW

[chest ap]
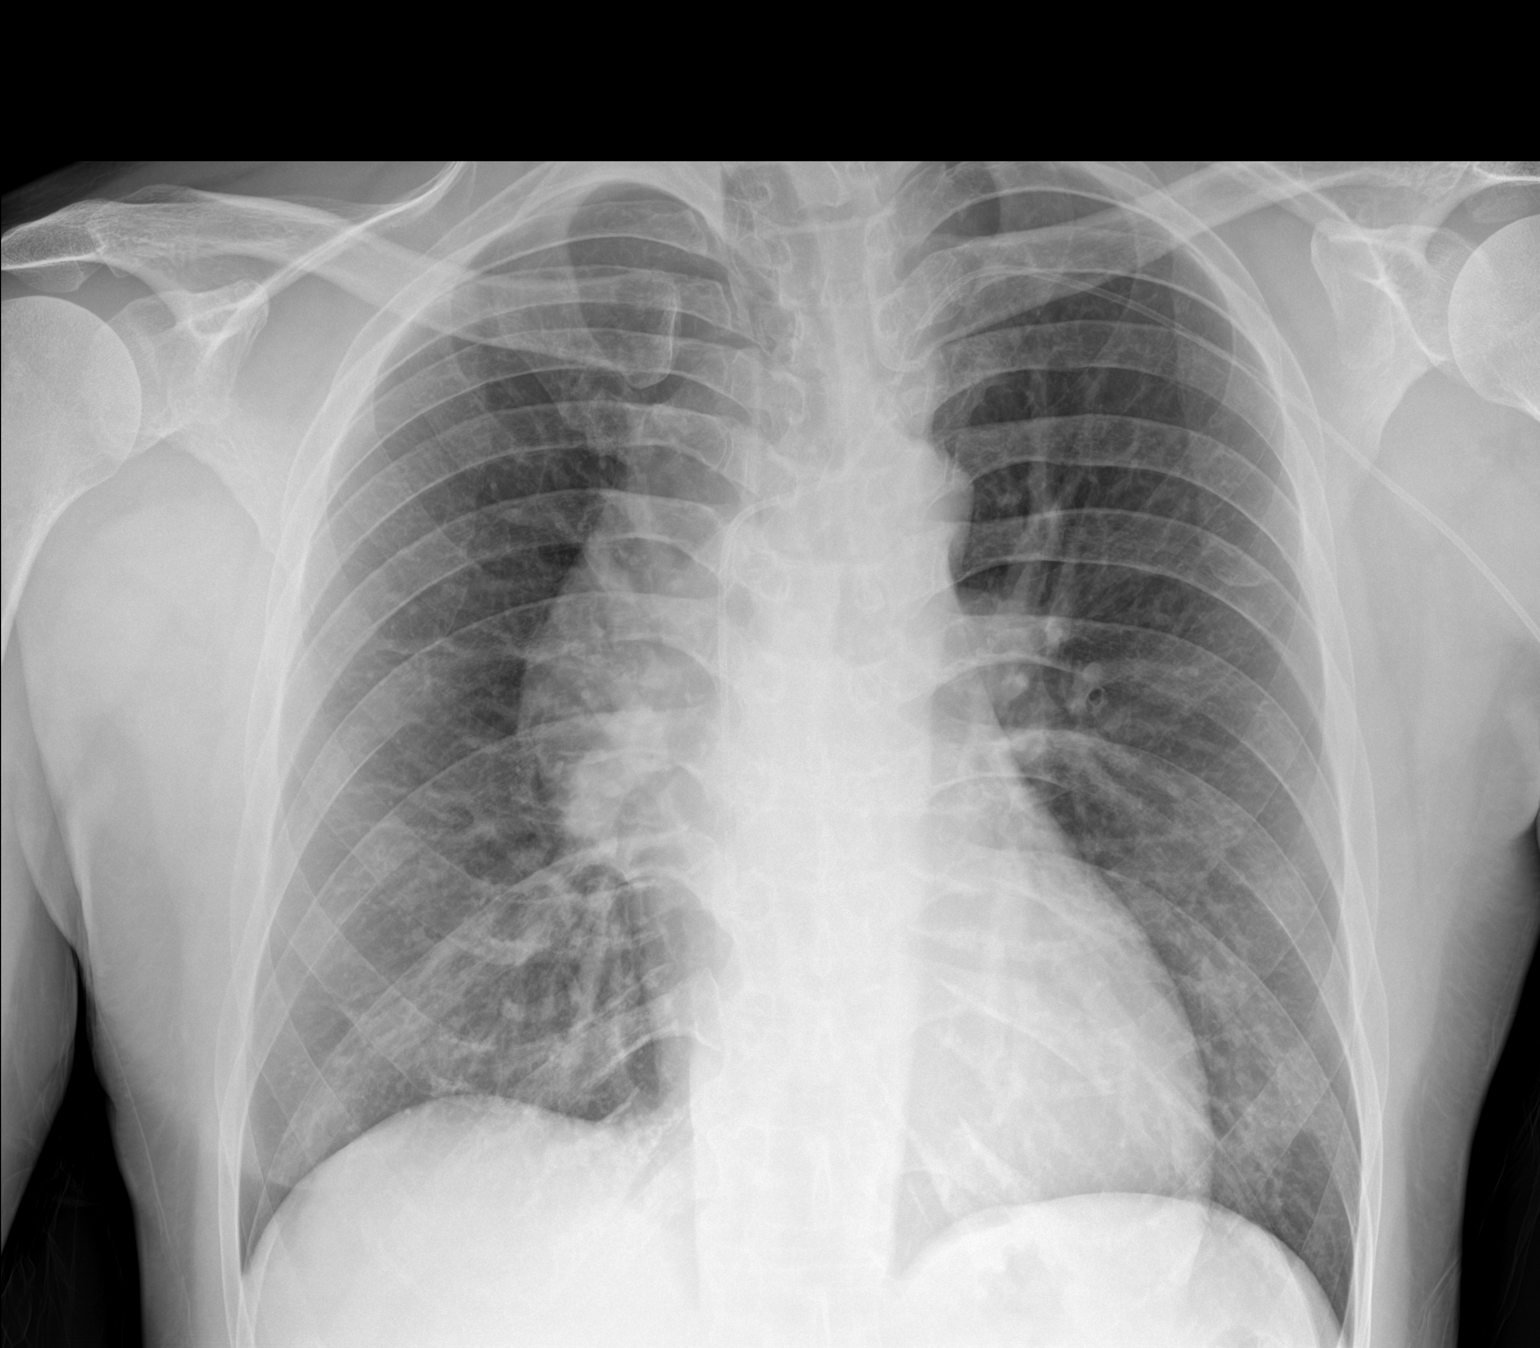

[1 of 1 positions shown; findings below may reference images not displayed]

FINDINGS: The heart size is normal. Right paratracheal mass noted, presumably
associated with diagnosis of lymphoma. Lungs are otherwise clear.
Left-sided PICC line is in place. Tip is at the cavoatrial junction.
Lungs are clear.
IMPRESSION: 1. Right paratracheal mass, presumably associated with lymphoma.
2. Left-sided PICC line in satisfactory position.
3. No acute cardiopulmonary disease.

## 2020-06-19 SURGERY — PORTA CATH INSERTION
Anesthesia: Moderate Sedation

## 2020-06-19 MED ORDER — FAMOTIDINE 20 MG PO TABS: 40.0000 mg | ORAL_TABLET | Freq: Once | ORAL | Status: DC | PRN

## 2020-06-19 MED ORDER — SODIUM CHLORIDE 0.9 % IV SOLN
Freq: Once | INTRAVENOUS | Status: DC
  Filled 2020-06-19: qty 2

## 2020-06-19 MED ORDER — CHLORHEXIDINE GLUCONATE CLOTH 2 % EX PADS: 6.0000 | MEDICATED_PAD | Freq: Every day | CUTANEOUS | Status: DC

## 2020-06-19 MED ORDER — SODIUM CHLORIDE 0.9 % IV SOLN: INTRAVENOUS | Status: DC

## 2020-06-19 MED ORDER — CEFAZOLIN SODIUM-DEXTROSE 2-4 GM/100ML-% IV SOLN
INTRAVENOUS | Status: AC
  Filled 2020-06-19: qty 100

## 2020-06-19 MED ORDER — ONDANSETRON HCL 4 MG/2ML IJ SOLN: 4.0000 mg | Freq: Four times a day (QID) | INTRAMUSCULAR | Status: DC | PRN

## 2020-06-19 MED ORDER — DIPHENHYDRAMINE HCL 50 MG/ML IJ SOLN: 50.0000 mg | Freq: Once | INTRAMUSCULAR | Status: DC | PRN

## 2020-06-19 MED ORDER — MIDAZOLAM HCL 2 MG/ML PO SYRP: 8.0000 mg | ORAL_SOLUTION | Freq: Once | ORAL | Status: DC | PRN

## 2020-06-19 MED ORDER — METHYLPREDNISOLONE SODIUM SUCC 125 MG IJ SOLR: 125.0000 mg | Freq: Once | INTRAMUSCULAR | Status: DC | PRN

## 2020-06-19 MED ORDER — CEFAZOLIN SODIUM-DEXTROSE 2-4 GM/100ML-% IV SOLN: 2.0000 g | Freq: Once | INTRAVENOUS | Status: DC

## 2020-06-19 MED ORDER — HYDROMORPHONE HCL 1 MG/ML IJ SOLN: 1.0000 mg | Freq: Once | INTRAMUSCULAR | Status: DC | PRN

## 2020-06-19 NOTE — Interval H&P Note (Signed)
History and Physical Interval Note:  06/19/2020 9:06 AM  Preston [name]  has presented today for surgery, with the diagnosis of Porta Cath Placement   Hodgkin Lymphoma Covid  Nov 4.  The various methods of treatment have been discussed with the patient and family. After consideration of risks, benefits and other options for treatment, the patient has consented to  Procedure(s): PORTA CATH INSERTION (N/A) as a surgical intervention.  The patient's history has been reviewed, patient examined, no change in status, stable for surgery.  I have reviewed the patient's chart and labs.  Questions were answered to the patient's satisfaction.     Leotis Pain

## 2020-06-19 NOTE — Progress Notes (Signed)
Patient having his port evaluated to see if this is usable for his chemotherapy access.  This was placed in the left antecubital fossa.  We obtained a chest x-ray today which demonstrates the port catheter to be in excellent location with the tip all the way down to the SVC so this is usable for his chemotherapy and I would continue to use this rather than revise it to place the reservoir in a different location as long as he is doing well with it.

## 2020-06-26 ENCOUNTER — Encounter: Payer: Self-pay | Admitting: Oncology

## 2020-06-26 ENCOUNTER — Inpatient Hospital Stay (HOSPITAL_BASED_OUTPATIENT_CLINIC_OR_DEPARTMENT_OTHER): Payer: Self-pay | Admitting: Oncology

## 2020-06-26 ENCOUNTER — Inpatient Hospital Stay: Payer: Self-pay | Attending: Oncology

## 2020-06-26 ENCOUNTER — Inpatient Hospital Stay: Payer: Self-pay

## 2020-06-26 VITALS — BP 121/82 | HR 76 | Temp 96.0°F | Resp 16 | Wt 169.3 lb

## 2020-06-26 DIAGNOSIS — C8172 Other classical Hodgkin lymphoma, intrathoracic lymph nodes: Secondary | ICD-10-CM

## 2020-06-26 DIAGNOSIS — M542 Cervicalgia: Secondary | ICD-10-CM

## 2020-06-26 DIAGNOSIS — Z5111 Encounter for antineoplastic chemotherapy: Secondary | ICD-10-CM | POA: Insufficient documentation

## 2020-06-26 DIAGNOSIS — Z720 Tobacco use: Secondary | ICD-10-CM

## 2020-06-26 DIAGNOSIS — Z79899 Other long term (current) drug therapy: Secondary | ICD-10-CM | POA: Insufficient documentation

## 2020-06-26 DIAGNOSIS — C8192 Hodgkin lymphoma, unspecified, intrathoracic lymph nodes: Secondary | ICD-10-CM | POA: Insufficient documentation

## 2020-06-26 DIAGNOSIS — Z7189 Other specified counseling: Secondary | ICD-10-CM

## 2020-06-26 LAB — CBC WITH DIFFERENTIAL/PLATELET
Abs Immature Granulocytes: 0.03 10*3/uL (ref 0.00–0.07)
Basophils Absolute: 0 10*3/uL (ref 0.0–0.1)
Basophils Relative: 1 %
Eosinophils Absolute: 0.1 10*3/uL (ref 0.0–0.5)
Eosinophils Relative: 1 %
HCT: 43.3 % (ref 39.0–52.0)
Hemoglobin: 15.9 g/dL (ref 13.0–17.0)
Immature Granulocytes: 0 %
Lymphocytes Relative: 29 %
Lymphs Abs: 2.5 10*3/uL (ref 0.7–4.0)
MCH: 31.2 pg (ref 26.0–34.0)
MCHC: 36.7 g/dL — ABNORMAL HIGH (ref 30.0–36.0)
MCV: 84.9 fL (ref 80.0–100.0)
Monocytes Absolute: 0.8 10*3/uL (ref 0.1–1.0)
Monocytes Relative: 10 %
Neutro Abs: 5.1 10*3/uL (ref 1.7–7.7)
Neutrophils Relative %: 59 %
Platelets: 295 10*3/uL (ref 150–400)
RBC: 5.1 MIL/uL (ref 4.22–5.81)
RDW: 12.9 % (ref 11.5–15.5)
WBC: 8.6 10*3/uL (ref 4.0–10.5)
nRBC: 0 % (ref 0.0–0.2)

## 2020-06-26 LAB — COMPREHENSIVE METABOLIC PANEL
ALT: 16 U/L (ref 0–44)
AST: 29 U/L (ref 15–41)
Albumin: 4.5 g/dL (ref 3.5–5.0)
Alkaline Phosphatase: 52 U/L (ref 38–126)
Anion gap: 11 (ref 5–15)
BUN: 6 mg/dL (ref 6–20)
CO2: 23 mmol/L (ref 22–32)
Calcium: 8.9 mg/dL (ref 8.9–10.3)
Chloride: 104 mmol/L (ref 98–111)
Creatinine, Ser: 0.81 mg/dL (ref 0.61–1.24)
GFR, Estimated: >60 mL/min (ref >60)
Glucose, Bld: 94 mg/dL (ref 70–99)
Potassium: 3.8 mmol/L (ref 3.5–5.1)
Sodium: 138 mmol/L (ref 135–145)
Total Bilirubin: 0.7 mg/dL (ref 0.3–1.2)
Total Protein: 8.1 g/dL (ref 6.5–8.1)

## 2020-06-26 LAB — C-REACTIVE PROTEIN: CRP: 0.6 mg/dL (ref <1.0)

## 2020-06-26 MED ORDER — DOXORUBICIN HCL CHEMO IV INJECTION 2 MG/ML
25.0000 mg/m2 | Freq: Once | INTRAVENOUS | Status: AC
  Administered 2020-06-26: 46 mg via INTRAVENOUS
  Filled 2020-06-26: qty 23

## 2020-06-26 MED ORDER — SODIUM CHLORIDE 0.9% FLUSH
10.0000 mL | Freq: Once | INTRAVENOUS | Status: AC
  Administered 2020-06-26: 10 mL via INTRAVENOUS
  Filled 2020-06-26: qty 10

## 2020-06-26 MED ORDER — PALONOSETRON HCL INJECTION 0.25 MG/5ML
0.2500 mg | Freq: Once | INTRAVENOUS | Status: AC
  Administered 2020-06-26: 0.25 mg via INTRAVENOUS
  Filled 2020-06-26: qty 5

## 2020-06-26 MED ORDER — SODIUM CHLORIDE 0.9 % IV SOLN
150.0000 mg | Freq: Once | INTRAVENOUS | Status: AC
  Administered 2020-06-26: 150 mg via INTRAVENOUS
  Filled 2020-06-26: qty 150

## 2020-06-26 MED ORDER — VINBLASTINE SULFATE CHEMO INJECTION 1 MG/ML
10.0000 mg | Freq: Once | INTRAVENOUS | Status: AC
  Administered 2020-06-26: 10 mg via INTRAVENOUS
  Filled 2020-06-26: qty 10

## 2020-06-26 MED ORDER — SODIUM CHLORIDE 0.9 % IV SOLN
375.0000 mg/m2 | Freq: Once | INTRAVENOUS | Status: AC
  Administered 2020-06-26: 700 mg via INTRAVENOUS
  Filled 2020-06-26: qty 70

## 2020-06-26 MED ORDER — PROCHLORPERAZINE MALEATE 10 MG PO TABS: 10.0000 mg | ORAL_TABLET | Freq: Four times a day (QID) | ORAL | 1 refills | Status: DC | PRN

## 2020-06-26 MED ORDER — HEPARIN SOD (PORK) LOCK FLUSH 100 UNIT/ML IV SOLN
500.0000 [IU] | Freq: Once | INTRAVENOUS | Status: AC
  Administered 2020-06-26: 500 [IU] via INTRAVENOUS
  Filled 2020-06-26: qty 5

## 2020-06-26 MED ORDER — LIDOCAINE-PRILOCAINE 2.5-2.5 % EX CREA: 1.0000 "application " | TOPICAL_CREAM | CUTANEOUS | 1 refills | Status: DC | PRN

## 2020-06-26 MED ORDER — SODIUM CHLORIDE 0.9 % IV SOLN
10.0000 mg | Freq: Once | INTRAVENOUS | Status: AC
  Administered 2020-06-26: 10 mg via INTRAVENOUS
  Filled 2020-06-26: qty 10

## 2020-06-26 MED ORDER — ONDANSETRON HCL 8 MG PO TABS
8.0000 mg | ORAL_TABLET | Freq: Two times a day (BID) | ORAL | 1 refills | Status: DC
Start: 2020-06-26 — End: 2022-12-29

## 2020-06-26 MED ORDER — HEPARIN SOD (PORK) LOCK FLUSH 100 UNIT/ML IV SOLN
INTRAVENOUS | Status: AC
  Filled 2020-06-26: qty 5

## 2020-06-26 MED ORDER — SODIUM CHLORIDE 0.9 % IV SOLN
Freq: Once | INTRAVENOUS | Status: AC
  Filled 2020-06-26: qty 250

## 2020-06-26 NOTE — Progress Notes (Signed)
Pt here for treatment of AVD, he previously has had this infusion at another facility.

## 2020-06-26 NOTE — Progress Notes (Signed)
Hematology/Oncology Consult note [facility] Telephone:([phone] [phone]   Patient Care Team: Patient, No Pcp Per as PCP - General (General Practice)  REFERRING PROVIDER: No ref. provider found  CHIEF COMPLAINTS/REASON FOR VISIT:  Evaluation of hodgkin's lymphoma.   HISTORY OF PRESENTING ILLNESS:   Preston [name] is a  34 y.o.  male with PMH listed below was seen in consultation at the request of  No ref. provider found  for evaluation of Hodgkin's lymphoma.  Patient has a known diagnosis of Hodgkin's lymphoma and wants to transfer his oncology care to our cancer center.  Extensive medical records reviewed was performed  Previous oncology care was [place] Per note  10/28/2019 biopsy of right chest wall mass, fibromuscular soft tissue with atypical infiltrate consistent with Hodgkin lymphoma, classical type. There were scattered single atypical cells with morphology suggestive of Jaclynn Guarneri variant cells. These cells were reactive with CD 45, CD 15, CD 30 and nonreactive for pan keratin, cam 5.7, CD3, CD5, CD10, CD 20, melan-A, Sox 10. Ki-67 stained majority of Reed-Sternberg like cells.  Noticed right upper chest wall lump in January 2021. He was incarcerated for 27 months, released in May 2021.  Unintentional weight loss about 30 pounds, drenching night sweats.  01/17/2020 PET scan showed bulky anterior mediastinal mass 12.7cm. with extensive adenopathy in neck, right axilla, mediastinum, bilateral hilar regions, upper abdominal ligament adenopathy and retroperitoneal adenopathy. Abnormal hypermetabolic activity within a splenic focus. Mild patchy ground glass opacity in the right lower lobe may reflect postobstructive inflammatory change without hypermetabolic activity.   01/17/2020 Echo 1. Normal biventricular systolic function and size. LVEF 60%. Averageglobal longitudinal strain is normal, -20%  2. No significant  valvular dysfunction.  3. Normal right-and left-sided filling pressures. 4. No prior study for comparison.  12/30/2019 LDH 588 01/24/2020 CRP 67.2, ESR 60  Stage IIIB Hodgkin's lymphoma, he was recommended ABVD x 2 cycles, followed by PET with plan of AVD x 4 or escalate to BEACOPP if not favorable results.   01/26/2020 ABVD Day 1,15 x 2 cycles.   03/24/2020 PET scan showed significant interval response to therapy. All PET positive lesions previously resolved 03/30/2020 AVD D1 04/13/2020 AVD D15  He was not able to have chest medi port placed due to chest mass. He has central line placed on left upper extremity.     INTERVAL HISTORY Preston [name] is a 34 y.o. male who has above history reviewed by me today presents for follow up visit for management of Hodgkin's  lymphoma Problems and complaints are listed below: Patient has seen Dr. Lucky Cowboy for evaluation of the left upper extremity central line.  I discussed with Dr. Lucky Cowboy and tip is in good position in the SVC.  Okay to use from vascular surgeon's aspect. Today patient was accompanied by his girlfriend.  He has no new complaints. He asks for renewal of pain medication which he has been off for a while.  He reports having chronic back and neck pain.  He does not have any primary care provider to follow. Denies any fever, weight loss, chills, fatigue, night sweats    Review of Systems  Constitutional: Negative for appetite change, chills, fatigue, fever and unexpected weight change.  HENT:   Negative for hearing loss and voice change.   Eyes: Negative for eye problems and icterus.  Respiratory: Negative for chest tightness, cough and shortness of breath.   Cardiovascular: Negative for chest pain and leg swelling.  Gastrointestinal: Negative  for abdominal distention and abdominal pain.  Endocrine: Negative for hot flashes.  Genitourinary: Negative for difficulty urinating, dysuria and frequency.   Musculoskeletal: Positive for neck  pain. Negative for arthralgias.  Skin: Negative for itching and rash.  Neurological: Negative for light-headedness and numbness.  Hematological: Negative for adenopathy. Does not bruise/bleed easily.  Psychiatric/Behavioral: Negative for confusion.    MEDICAL HISTORY:  Past Medical History:  Diagnosis Date  . Hodgkin's lymphoma ([place])   . Tobacco use     SURGICAL HISTORY: History reviewed. No pertinent surgical history.  SOCIAL HISTORY: Social History   Socioeconomic History  . Marital status: Single    Spouse name: Not on file  . Number of children: Not on file  . Years of education: Not on file  . Highest education level: Not on file  Occupational History  . Not on file  Tobacco Use  . Smoking status: Current Every Day Smoker    Packs/day: 0.50    Years: 20.00    Pack years: 10.00  . Smokeless tobacco: Never Used  Vaping Use  . Vaping Use: Never used  Substance and Sexual Activity  . Alcohol use: Yes  . Drug use: Not Currently  . Sexual activity: Not on file  Other Topics Concern  . Not on file  Social History Narrative  . Not on file   Social Determinants of Health   Financial Resource Strain:   . Difficulty of Paying Living Expenses: Not on file  Food Insecurity:   . Worried About Charity fundraiser in the Last Year: Not on file  . Ran Out of Food in the Last Year: Not on file  Transportation Needs:   . Lack of Transportation (Medical): Not on file  . Lack of Transportation (Non-Medical): Not on file  Physical Activity:   . Days of Exercise per Week: Not on file  . Minutes of Exercise per Session: Not on file  Stress:   . Feeling of Stress : Not on file  Social Connections:   . Frequency of Communication with Friends and Family: Not on file  . Frequency of Social Gatherings with Friends and Family: Not on file  . Attends Religious Services: Not on file  . Active Member of Clubs or Organizations: Not on file  . Attends Archivist Meetings:  Not on file  . Marital Status: Not on file  Intimate Partner Violence:   . Fear of Current or Ex-Partner: Not on file  . Emotionally Abused: Not on file  . Physically Abused: Not on file  . Sexually Abused: Not on file    FAMILY HISTORY: Family History  Problem Relation Age of Onset  . Heart failure Mother   . Pneumonia Father     ALLERGIES:  has no allergies on file.  MEDICATIONS:  Current Outpatient Medications  Medication Sig Dispense Refill  . dexamethasone (DECADRON) 4 MG tablet Take by mouth.    . lidocaine-prilocaine (EMLA) cream Apply topically.    . ondansetron (ZOFRAN) 8 MG tablet Take by mouth.    . prochlorperazine (COMPAZINE) 10 MG tablet Take by mouth.    . traMADol (ULTRAM) 50 MG tablet Take 100 mg by mouth 2 (two) times daily as needed.     No current facility-administered medications for this visit.   Facility-Administered Medications Ordered in Other Visits  Medication Dose Route Frequency Provider Last Rate Last Admin  . heparin lock flush 100 unit/mL  500 Units Intravenous Once Earlie Server, MD  PHYSICAL EXAMINATION: ECOG PERFORMANCE STATUS: 0 - Asymptomatic Vitals:   06/26/20 0918 06/26/20 0933  BP: (!) 179/84 121/82  Pulse: 73 76  Resp: 16   Temp: (!) 96 F (35.6 C)   SpO2: 97%    Filed Weights   06/26/20 0918  Weight: 169 lb 4.8 oz (76.8 kg)    Physical Exam Constitutional:      General: He is not in acute distress. HENT:     Head: Normocephalic and atraumatic.  Eyes:     General: No scleral icterus. Cardiovascular:     Rate and Rhythm: Normal rate and regular rhythm.     Heart sounds: Normal heart sounds.  Pulmonary:     Effort: Pulmonary effort is normal. No respiratory distress.     Breath sounds: No wheezing.  Abdominal:     General: Bowel sounds are normal. There is no distension.     Palpations: Abdomen is soft.  Musculoskeletal:        General: No deformity. Normal range of motion.     Cervical back: Normal range of  motion and neck supple.  Skin:    General: Skin is warm and dry.     Findings: No erythema or rash.  Neurological:     Mental Status: He is alert and oriented to person, place, and time. Mental status is at baseline.     Cranial Nerves: No cranial nerve deficit.     Coordination: Coordination normal.  Psychiatric:        Mood and Affect: Mood normal.   left upper arm central line access     LABORATORY DATA:  I have reviewed the data as listed Lab Results  Component Value Date   WBC 8.6 06/26/2020   HGB 15.9 06/26/2020   HCT 43.3 06/26/2020   MCV 84.9 06/26/2020   PLT 295 06/26/2020   Recent Labs    05/17/20 1601  NA 140  K 3.9  CL 102  CO2 26  GLUCOSE 91  BUN 15  CREATININE 0.85  CALCIUM 9.1  GFRNONAA >60  PROT 7.8  ALBUMIN 4.2  AST 30  ALT 23  ALKPHOS 47  BILITOT 0.8   Iron/TIBC/Ferritin/ %Sat No results found for: IRON, TIBC, FERRITIN, IRONPCTSAT    RADIOGRAPHIC STUDIES: I have personally reviewed the radiological images as listed and agreed with the findings in the report. DG Chest Port 1 View  Result Date: 06/19/2020 CLINICAL DATA:  Hodgkin's lymphoma. Preop for Port-A-Cath placement. EXAM: PORTABLE CHEST 1 VIEW COMPARISON:  None. FINDINGS: The heart size is normal. Right paratracheal mass noted, presumably associated with diagnosis of lymphoma. Lungs are otherwise clear. Left-sided PICC line is in place. Tip is at the cavoatrial junction. Lungs are clear. IMPRESSION: 1. Right paratracheal mass, presumably associated with lymphoma. 2. Left-sided PICC line in satisfactory position. 3. No acute cardiopulmonary disease. Electronically Signed   By: San Morelle M.D.   On: 06/19/2020 16:37      ASSESSMENT & PLAN:  1. 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 Tobacco use   3. Goals of care, counseling/discussion   4. Neck pain    # Hodgkin's lymphoma, classic type Per previous Oncologist note, S/p 2 cycles of ABVD  with favorable PET outcome ([place] 3), and then 1 cycle of AVD.  Pathology consultation on biopsy specimen from 10/28/2019 showed findings compatible with classic Hodgkin's lymphoma. Labs reviewed and discussed with the patient. Proceed with cycle 4 AVD- (cycle 1 at [facility]). Echo was  done prior to his chemotherapy at previous facility. No clinical signs of heart failure.   #Left upper arm central line access, okay to use her vascular surgeon.  #Regarding to his pain medication, I discussed with the patient that his last PET scan showed favorable response with all FDG avid area resolved.  I am not sure if his ongoing intermittent pain is truly related to Hodgkin's lymphoma.  I recommend patient establish care with primary care provider for further discussion. He may try Tylenol 650 mg every 6 hours as needed for pain.  Smoke cessation recommended.   All questions were answered. The patient knows to call the clinic with any problems questions or concerns.  Return of visit: 2 weeks  Earlie Server, MD, PhD Hematology Oncology [facility] at [facility] Pager- [phone] 06/26/2020

## 2020-06-27 ENCOUNTER — Telehealth: Payer: Self-pay

## 2020-06-27 NOTE — Telephone Encounter (Signed)
Telephone call to patient for follow up after receiving first infusion.   Patient signifoicant other states infusion went great.  States eating good and drinking plenty of fluids.   Denies any nausea or vomiting.  Encouraged patient to call for any concerns or questions.

## 2020-07-03 ENCOUNTER — Inpatient Hospital Stay: Payer: Self-pay

## 2020-07-03 NOTE — Progress Notes (Signed)
Nutrition  Patient was a no show at nutrition appointment for today.  Message sent to scheduling to offer another appointment and to inform provider.   Terrell Ostrand B. Zenia Resides, [place], [place] Registered Dietitian [phone] (mobile)

## 2020-07-10 ENCOUNTER — Inpatient Hospital Stay: Payer: Self-pay

## 2020-07-10 ENCOUNTER — Encounter: Payer: Self-pay | Admitting: Oncology

## 2020-07-10 ENCOUNTER — Inpatient Hospital Stay (HOSPITAL_BASED_OUTPATIENT_CLINIC_OR_DEPARTMENT_OTHER): Payer: Self-pay | Admitting: Oncology

## 2020-07-10 VITALS — BP 132/64 | HR 88 | Resp 16

## 2020-07-10 VITALS — BP 135/85 | HR 83 | Temp 97.8°F | Resp 20 | Wt 172.4 lb

## 2020-07-10 DIAGNOSIS — C8172 Other classical Hodgkin lymphoma, intrathoracic lymph nodes: Secondary | ICD-10-CM

## 2020-07-10 DIAGNOSIS — Z5111 Encounter for antineoplastic chemotherapy: Secondary | ICD-10-CM

## 2020-07-10 DIAGNOSIS — Z720 Tobacco use: Secondary | ICD-10-CM

## 2020-07-10 LAB — COMPREHENSIVE METABOLIC PANEL
ALT: 33 U/L (ref 0–44)
AST: 37 U/L (ref 15–41)
Albumin: 4.1 g/dL (ref 3.5–5.0)
Alkaline Phosphatase: 55 U/L (ref 38–126)
Anion gap: 11 (ref 5–15)
BUN: 9 mg/dL (ref 6–20)
CO2: 25 mmol/L (ref 22–32)
Calcium: 8.8 mg/dL — ABNORMAL LOW (ref 8.9–10.3)
Chloride: 101 mmol/L (ref 98–111)
Creatinine, Ser: 0.68 mg/dL (ref 0.61–1.24)
GFR, Estimated: >60 mL/min (ref >60)
Glucose, Bld: 98 mg/dL (ref 70–99)
Potassium: 3.7 mmol/L (ref 3.5–5.1)
Sodium: 137 mmol/L (ref 135–145)
Total Bilirubin: 0.7 mg/dL (ref 0.3–1.2)
Total Protein: 7.8 g/dL (ref 6.5–8.1)

## 2020-07-10 LAB — CBC WITH DIFFERENTIAL/PLATELET
Abs Immature Granulocytes: 0 10*3/uL (ref 0.00–0.07)
Basophils Absolute: 0 10*3/uL (ref 0.0–0.1)
Basophils Relative: 0 %
Eosinophils Absolute: 0 10*3/uL (ref 0.0–0.5)
Eosinophils Relative: 0 %
HCT: 40.5 % (ref 39.0–52.0)
Hemoglobin: 14.7 g/dL (ref 13.0–17.0)
Immature Granulocytes: 0 %
Lymphocytes Relative: 43 %
Lymphs Abs: 1.3 10*3/uL (ref 0.7–4.0)
MCH: 30.9 pg (ref 26.0–34.0)
MCHC: 36.3 g/dL — ABNORMAL HIGH (ref 30.0–36.0)
MCV: 85.1 fL (ref 80.0–100.0)
Monocytes Absolute: 0.5 10*3/uL (ref 0.1–1.0)
Monocytes Relative: 17 %
Neutro Abs: 1.2 10*3/uL — ABNORMAL LOW (ref 1.7–7.7)
Neutrophils Relative %: 40 %
Platelets: 205 10*3/uL (ref 150–400)
RBC: 4.76 MIL/uL (ref 4.22–5.81)
RDW: 12.6 % (ref 11.5–15.5)
WBC: 3 10*3/uL — ABNORMAL LOW (ref 4.0–10.5)
nRBC: 0 % (ref 0.0–0.2)

## 2020-07-10 MED ORDER — DOXORUBICIN HCL CHEMO IV INJECTION 2 MG/ML
25.0000 mg/m2 | Freq: Once | INTRAVENOUS | Status: AC
  Administered 2020-07-10: 46 mg via INTRAVENOUS
  Filled 2020-07-10: qty 23

## 2020-07-10 MED ORDER — HEPARIN SOD (PORK) LOCK FLUSH 100 UNIT/ML IV SOLN
INTRAVENOUS | Status: AC
  Filled 2020-07-10: qty 5

## 2020-07-10 MED ORDER — HEPARIN SOD (PORK) LOCK FLUSH 100 UNIT/ML IV SOLN
500.0000 [IU] | Freq: Once | INTRAVENOUS | Status: AC
  Administered 2020-07-10: 500 [IU] via INTRAVENOUS
  Filled 2020-07-10: qty 5

## 2020-07-10 MED ORDER — VINBLASTINE SULFATE CHEMO INJECTION 1 MG/ML
6.0000 mg/m2 | Freq: Once | INTRAVENOUS | Status: AC
  Administered 2020-07-10: 11.2 mg via INTRAVENOUS
  Filled 2020-07-10: qty 11.2

## 2020-07-10 MED ORDER — SODIUM CHLORIDE 0.9 % IV SOLN
Freq: Once | INTRAVENOUS | Status: AC
  Filled 2020-07-10: qty 250

## 2020-07-10 MED ORDER — SODIUM CHLORIDE 0.9 % IV SOLN
375.0000 mg/m2 | Freq: Once | INTRAVENOUS | Status: AC
  Administered 2020-07-10: 700 mg via INTRAVENOUS
  Filled 2020-07-10: qty 70

## 2020-07-10 MED ORDER — SODIUM CHLORIDE 0.9% FLUSH
10.0000 mL | Freq: Once | INTRAVENOUS | Status: AC
  Administered 2020-07-10: 10 mL via INTRAVENOUS
  Filled 2020-07-10: qty 10

## 2020-07-10 MED ORDER — PALONOSETRON HCL INJECTION 0.25 MG/5ML
0.2500 mg | Freq: Once | INTRAVENOUS | Status: AC
  Administered 2020-07-10: 0.25 mg via INTRAVENOUS
  Filled 2020-07-10: qty 5

## 2020-07-10 MED ORDER — SODIUM CHLORIDE 0.9 % IV SOLN
150.0000 mg | Freq: Once | INTRAVENOUS | Status: AC
  Administered 2020-07-10: 150 mg via INTRAVENOUS
  Filled 2020-07-10: qty 150

## 2020-07-10 MED ORDER — SODIUM CHLORIDE 0.9 % IV SOLN
10.0000 mg | Freq: Once | INTRAVENOUS | Status: AC
  Administered 2020-07-10: 10 mg via INTRAVENOUS
  Filled 2020-07-10: qty 10

## 2020-07-10 NOTE — Progress Notes (Signed)
Patient tolerated infusion well. Patient and VSS. Discharged home  

## 2020-07-10 NOTE — Progress Notes (Signed)
Hematology/Oncology Consult note [facility] Telephone:([phone] Fax:([phone]   Patient Care Team: Patient, No Pcp Per as PCP - General (General Practice)  REFERRING PROVIDER: No ref. provider found  CHIEF COMPLAINTS/REASON FOR VISIT:  Evaluation of hodgkin's lymphoma.   HISTORY OF PRESENTING ILLNESS:   Preston [name] is a  34 y.o.  male with PMH listed below was seen in consultation at the request of  No ref. provider found  for evaluation of Hodgkin's lymphoma.  Patient has a known diagnosis of Hodgkin's lymphoma and wants to transfer his oncology care to our cancer center.  Extensive medical records reviewed was performed  Previous oncology care was [place] Per note  10/28/2019 biopsy of right chest wall mass, fibromuscular soft tissue with atypical infiltrate consistent with Hodgkin lymphoma, classical type. There were scattered single atypical cells with morphology suggestive of Jaclynn Guarneri variant cells. These cells were reactive with CD 45, CD 15, CD 30 and nonreactive for pan keratin, cam 5.7, CD3, CD5, CD10, CD 20, melan-A, Sox 10. Ki-67 stained majority of Reed-Sternberg like cells.  Noticed right upper chest wall lump in January 2021. He was incarcerated for 27 months, released in May 2021.  Unintentional weight loss about 30 pounds, drenching night sweats.  01/17/2020 PET scan showed bulky anterior mediastinal mass 12.7cm. with extensive adenopathy in neck, right axilla, mediastinum, bilateral hilar regions, upper abdominal ligament adenopathy and retroperitoneal adenopathy. Abnormal hypermetabolic activity within a splenic focus. Mild patchy ground glass opacity in the right lower lobe may reflect postobstructive inflammatory change without hypermetabolic activity.   01/17/2020 Echo 1. Normal biventricular systolic function and size. LVEF 60%. Averageglobal longitudinal strain is normal, -20%  2. No significant  valvular dysfunction.  3. Normal right-and left-sided filling pressures. 4. No prior study for comparison.  12/30/2019 LDH 588 01/24/2020 CRP 67.2, ESR 60  Stage IIIB Hodgkin's lymphoma, he was recommended ABVD x 2 cycles, followed by PET with plan of AVD x 4 or escalate to BEACOPP if not favorable results.   01/26/2020 ABVD Day 1,15 x 2 cycles.   03/24/2020 PET scan showed significant interval response to therapy. All PET positive lesions previously resolved 03/30/2020 AVD D1 04/13/2020 AVD D15  He was not able to have chest medi port placed due to chest mass. He has central line placed on left upper extremity. #Central line access Patient has seen Dr. Lucky Cowboy for evaluation of the left upper extremity central line.  I discussed with Dr. Lucky Cowboy and tip is in good position in the SVC.  Okay to use from vascular surgeon's aspect.  [facility] pathology consultation on biopsy specimen from 10/28/2019 showed findings compatible with classic Hodgkin's lymphoma.  INTERVAL HISTORY Preston [name] is a 34 y.o. male who has above history reviewed by me today presents for follow up visit for management of Hodgkin's  Lymphoma Patient reports feeling well.  No new complaints.  Denies any fever, chills, nausea vomiting.  Appetite is fair. He has gained weight since last visit.  Review of Systems  Constitutional: Negative for appetite change, chills, fatigue, fever and unexpected weight change.  HENT:   Negative for hearing loss and voice change.   Eyes: Negative for eye problems and icterus.  Respiratory: Negative for chest tightness, cough and shortness of breath.   Cardiovascular: Negative for chest pain and leg swelling.  Gastrointestinal: Negative for abdominal distention and abdominal pain.  Endocrine: Negative for hot flashes.  Genitourinary: Negative for difficulty urinating, dysuria and frequency.  Musculoskeletal: Negative for arthralgias and neck pain.  Skin: Negative for itching and rash.   Neurological: Negative for light-headedness and numbness.  Hematological: Negative for adenopathy. Does not bruise/bleed easily.  Psychiatric/Behavioral: Negative for confusion.    MEDICAL HISTORY:  Past Medical History:  Diagnosis Date  . Hodgkin's lymphoma ([place])   . Tobacco use     SURGICAL HISTORY: History reviewed. No pertinent surgical history.  SOCIAL HISTORY: Social History   Socioeconomic History  . Marital status: Single    Spouse name: Not on file  . Number of children: Not on file  . Years of education: Not on file  . Highest education level: Not on file  Occupational History  . Not on file  Tobacco Use  . Smoking status: Current Every Day Smoker    Packs/day: 0.50    Years: 20.00    Pack years: 10.00  . Smokeless tobacco: Never Used  Vaping Use  . Vaping Use: Never used  Substance and Sexual Activity  . Alcohol use: Yes  . Drug use: Not Currently  . Sexual activity: Not on file  Other Topics Concern  . Not on file  Social History Narrative  . Not on file   Social Determinants of Health   Financial Resource Strain:   . Difficulty of Paying Living Expenses: Not on file  Food Insecurity:   . Worried About Charity fundraiser in the Last Year: Not on file  . Ran Out of Food in the Last Year: Not on file  Transportation Needs:   . Lack of Transportation (Medical): Not on file  . Lack of Transportation (Non-Medical): Not on file  Physical Activity:   . Days of Exercise per Week: Not on file  . Minutes of Exercise per Session: Not on file  Stress:   . Feeling of Stress : Not on file  Social Connections:   . Frequency of Communication with Friends and Family: Not on file  . Frequency of Social Gatherings with Friends and Family: Not on file  . Attends Religious Services: Not on file  . Active Member of Clubs or Organizations: Not on file  . Attends Archivist Meetings: Not on file  . Marital Status: Not on file  Intimate Partner  Violence:   . Fear of Current or Ex-Partner: Not on file  . Emotionally Abused: Not on file  . Physically Abused: Not on file  . Sexually Abused: Not on file    FAMILY HISTORY: Family History  Problem Relation Age of Onset  . Heart failure Mother   . Pneumonia Father     ALLERGIES:  has no allergies on file.  MEDICATIONS:  Current Outpatient Medications  Medication Sig Dispense Refill  . ondansetron (ZOFRAN) 8 MG tablet Take 1 tablet (8 mg total) by mouth 2 (two) times daily. 60 tablet 1  . prochlorperazine (COMPAZINE) 10 MG tablet Take 1 tablet (10 mg total) by mouth every 6 (six) hours as needed for nausea or vomiting. 60 tablet 1  . lidocaine-prilocaine (EMLA) cream Apply 1 application topically as needed. (Patient not taking: Reported on 07/10/2020) 30 g 1  . traMADol (ULTRAM) 50 MG tablet Take 100 mg by mouth 2 (two) times daily as needed.     No current facility-administered medications for this visit.     PHYSICAL EXAMINATION: ECOG PERFORMANCE STATUS: 0 - Asymptomatic Vitals:   07/10/20 0847  BP: 135/85  Pulse: 83  Resp: 20  Temp: 97.8 F (36.6 C)  SpO2: 100%  Filed Weights   07/10/20 0847  Weight: 172 lb 6.4 oz (78.2 kg)    Physical Exam Constitutional:      General: He is not in acute distress. HENT:     Head: Normocephalic and atraumatic.  Eyes:     General: No scleral icterus. Cardiovascular:     Rate and Rhythm: Normal rate and regular rhythm.     Heart sounds: Normal heart sounds.  Pulmonary:     Effort: Pulmonary effort is normal. No respiratory distress.     Breath sounds: No wheezing.  Abdominal:     General: Bowel sounds are normal. There is no distension.     Palpations: Abdomen is soft.  Musculoskeletal:        General: No deformity. Normal range of motion.     Cervical back: Normal range of motion and neck supple.  Skin:    General: Skin is warm and dry.     Findings: No erythema or rash.  Neurological:     Mental Status: He is  alert and oriented to person, place, and time. Mental status is at baseline.     Cranial Nerves: No cranial nerve deficit.     Coordination: Coordination normal.  Psychiatric:        Mood and Affect: Mood normal.   left upper arm central line access     LABORATORY DATA:  I have reviewed the data as listed Lab Results  Component Value Date   WBC 3.0 (L) 07/10/2020   HGB 14.7 07/10/2020   HCT 40.5 07/10/2020   MCV 85.1 07/10/2020   PLT 205 07/10/2020   Recent Labs    05/17/20 1601 06/26/20 0919 07/10/20 0838  NA 140 138 137  K 3.9 3.8 3.7  CL 102 104 101  CO2 _0 GLUCOSE 91 94 98  BUN _1 CREATININE 0.85 0.81 0.68  CALCIUM 9.1 8.9 8.8*  GFRNONAA >60 >60 >60  PROT 7.8 8.1 7.8  ALBUMIN 4.2 4.5 4.1  AST 30 29 37  ALT 23 16 33  ALKPHOS 47 52 55  BILITOT 0.8 0.7 0.7   Iron/TIBC/Ferritin/ %Sat No results found for: IRON, TIBC, FERRITIN, IRONPCTSAT    RADIOGRAPHIC STUDIES: I have personally reviewed the radiological images as listed and agreed with the findings in the report. DG Chest Port 1 View  Result Date: 06/19/2020 CLINICAL DATA:  Hodgkin's lymphoma. Preop for Port-A-Cath placement. EXAM: PORTABLE CHEST 1 VIEW COMPARISON:  None. FINDINGS: The heart size is normal. Right paratracheal mass noted, presumably associated with diagnosis of lymphoma. Lungs are otherwise clear. Left-sided PICC line is in place. Tip is at the cavoatrial junction. Lungs are clear. IMPRESSION: 1. Right paratracheal mass, presumably associated with lymphoma. 2. Left-sided PICC line in satisfactory position. 3. No acute cardiopulmonary disease. Electronically Signed   By: San Morelle M.D.   On: 06/19/2020 16:37      ASSESSMENT & PLAN:  1. 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 Tobacco use   3. Encounter for antineoplastic chemotherapy    # Hodgkin's lymphoma, classic type Per previous Oncologist note, S/p 2 cycles of ABVD with favorable  PET outcome ([place] 3), and then 1 cycle of AVD.  Labs are reviewed and discussed with patient. Counts acceptable to proceed with cycle 4 AVD-day 15. Echocardiogram was done in June 2021.  No clinical signs of heart failure.  I will repeat a 2D echocardiogram for follow-up.  #Regarding to his pain medication,  I discussed with the patient that his last PET scan showed favorable response with all FDG avid area resolved.  I am not sure if his ongoing intermittent pain is truly related to Hodgkin's lymphoma.  I recommend patient establish care with primary care provider for further discussion. He may try Tylenol 650 mg every 6 hours as needed for pain.  #Chemotherapy induced nausea, mild symptoms.  Patient has Zofran and Compazine at home. Smoke cessation recommended.   All questions were answered. The patient knows to call the clinic with any problems questions or concerns.  Return of visit: 2 weeks  Earlie Server, MD, PhD Hematology Oncology [facility] at [facility] Pager- [phone] 07/10/2020

## 2020-07-11 ENCOUNTER — Telehealth: Payer: Self-pay

## 2020-07-11 DIAGNOSIS — C8172 Other classical Hodgkin lymphoma, intrathoracic lymph nodes: Secondary | ICD-10-CM

## 2020-07-11 NOTE — Telephone Encounter (Signed)
Echo results done at [facility] on 01/27/20 available in [place].  Dr. Tasia Catchings would like to obtain another echo.  Please schedule echo and I will inform patient of appt details.

## 2020-07-12 NOTE — Telephone Encounter (Signed)
Done..  Pt Echo has been sched for 07/19/20 @ 10am.

## 2020-07-13 NOTE — Telephone Encounter (Signed)
Erica notified.  Text sent for MyChart activation.

## 2020-07-13 NOTE — Telephone Encounter (Signed)
attempted to call patient/significant other Preston [name]) to inform of appt but no answer and mailbox is full.  Will try again later today.

## 2020-07-19 ENCOUNTER — Ambulatory Visit: Payer: Self-pay

## 2020-07-24 ENCOUNTER — Inpatient Hospital Stay: Payer: Self-pay | Admitting: Oncology

## 2020-07-24 ENCOUNTER — Inpatient Hospital Stay: Payer: Self-pay

## 2020-07-24 ENCOUNTER — Inpatient Hospital Stay: Payer: Self-pay | Attending: Oncology

## 2020-07-24 DIAGNOSIS — M6281 Muscle weakness (generalized): Secondary | ICD-10-CM | POA: Insufficient documentation

## 2020-07-24 DIAGNOSIS — C8192 Hodgkin lymphoma, unspecified, intrathoracic lymph nodes: Secondary | ICD-10-CM | POA: Insufficient documentation

## 2020-07-24 DIAGNOSIS — M25512 Pain in left shoulder: Secondary | ICD-10-CM | POA: Insufficient documentation

## 2020-07-24 DIAGNOSIS — R0789 Other chest pain: Secondary | ICD-10-CM | POA: Insufficient documentation

## 2020-07-24 DIAGNOSIS — R200 Anesthesia of skin: Secondary | ICD-10-CM | POA: Insufficient documentation

## 2020-07-24 DIAGNOSIS — R079 Chest pain, unspecified: Secondary | ICD-10-CM | POA: Insufficient documentation

## 2020-07-24 DIAGNOSIS — F17210 Nicotine dependence, cigarettes, uncomplicated: Secondary | ICD-10-CM | POA: Insufficient documentation

## 2020-07-26 ENCOUNTER — Telehealth: Payer: Self-pay | Admitting: Oncology

## 2020-07-26 NOTE — Telephone Encounter (Signed)
Done.Marland Kitchen ECHO appt has been R/S to 08/01/20 @ [place] sched for 11am arrive by 10:45 Pts girlfriend "Danae Chen" was made aware

## 2020-07-26 NOTE — Telephone Encounter (Signed)
Patient already scheduled for tx on 12/27. Please reschedule echo and notify pt/ pt's girlfried of appts. Thanks

## 2020-08-01 ENCOUNTER — Ambulatory Visit: Admission: RE | Admit: 2020-08-01 | Payer: Self-pay | Source: Ambulatory Visit

## 2020-08-03 ENCOUNTER — Inpatient Hospital Stay: Payer: Self-pay | Attending: Oncology

## 2020-08-03 ENCOUNTER — Telehealth: Payer: Self-pay

## 2020-08-03 NOTE — Telephone Encounter (Signed)
Nutrition ° °Patient was a no show for nutrition appointment today.   ° °Oksana Deberry B. Ndrew Creason, RD, LDN °Registered Dietitian °[phone] (mobile) ° ° °

## 2020-08-07 ENCOUNTER — Inpatient Hospital Stay: Payer: Self-pay

## 2020-08-07 ENCOUNTER — Encounter: Payer: Self-pay | Admitting: Oncology

## 2020-08-07 ENCOUNTER — Other Ambulatory Visit: Payer: Self-pay

## 2020-08-07 ENCOUNTER — Emergency Department: Payer: Self-pay

## 2020-08-07 ENCOUNTER — Other Ambulatory Visit: Payer: Self-pay | Admitting: Oncology

## 2020-08-07 ENCOUNTER — Emergency Department
Admission: EM | Admit: 2020-08-07 | Discharge: 2020-08-07 | Disposition: A | Discharge status: Home / Self Care | Payer: Self-pay | Attending: Emergency Medicine | Admitting: Emergency Medicine

## 2020-08-07 ENCOUNTER — Inpatient Hospital Stay (HOSPITAL_BASED_OUTPATIENT_CLINIC_OR_DEPARTMENT_OTHER): Payer: Self-pay | Admitting: Oncology

## 2020-08-07 VITALS — BP 122/92 | HR 93 | Temp 98.7°F | Resp 18 | Wt 168.7 lb

## 2020-08-07 DIAGNOSIS — F17200 Nicotine dependence, unspecified, uncomplicated: Secondary | ICD-10-CM | POA: Insufficient documentation

## 2020-08-07 DIAGNOSIS — R0789 Other chest pain: Secondary | ICD-10-CM

## 2020-08-07 DIAGNOSIS — C8192 Hodgkin lymphoma, unspecified, intrathoracic lymph nodes: Secondary | ICD-10-CM | POA: Insufficient documentation

## 2020-08-07 DIAGNOSIS — C8172 Other classical Hodgkin lymphoma, intrathoracic lymph nodes: Secondary | ICD-10-CM

## 2020-08-07 DIAGNOSIS — R590 Localized enlarged lymph nodes: Secondary | ICD-10-CM

## 2020-08-07 DIAGNOSIS — Z5111 Encounter for antineoplastic chemotherapy: Secondary | ICD-10-CM

## 2020-08-07 DIAGNOSIS — R079 Chest pain, unspecified: Secondary | ICD-10-CM | POA: Insufficient documentation

## 2020-08-07 DIAGNOSIS — R202 Paresthesia of skin: Secondary | ICD-10-CM | POA: Insufficient documentation

## 2020-08-07 DIAGNOSIS — M25512 Pain in left shoulder: Secondary | ICD-10-CM | POA: Insufficient documentation

## 2020-08-07 LAB — COMPREHENSIVE METABOLIC PANEL
ALT: 22 U/L (ref 0–44)
AST: 26 U/L (ref 15–41)
Albumin: 3.9 g/dL (ref 3.5–5.0)
Alkaline Phosphatase: 50 U/L (ref 38–126)
Anion gap: 10 (ref 5–15)
BUN: 10 mg/dL (ref 6–20)
CO2: 26 mmol/L (ref 22–32)
Calcium: 9.5 mg/dL (ref 8.9–10.3)
Chloride: 100 mmol/L (ref 98–111)
Creatinine, Ser: 0.75 mg/dL (ref 0.61–1.24)
GFR, Estimated: >60 mL/min (ref >60)
Glucose, Bld: 112 mg/dL — ABNORMAL HIGH (ref 70–99)
Potassium: 4.3 mmol/L (ref 3.5–5.1)
Sodium: 136 mmol/L (ref 135–145)
Total Bilirubin: 1 mg/dL (ref 0.3–1.2)
Total Protein: 7.7 g/dL (ref 6.5–8.1)

## 2020-08-07 LAB — CBC WITH DIFFERENTIAL/PLATELET
Abs Immature Granulocytes: 0.07 10*3/uL (ref 0.00–0.07)
Basophils Absolute: 0 10*3/uL (ref 0.0–0.1)
Basophils Relative: 1 %
Eosinophils Absolute: 0 10*3/uL (ref 0.0–0.5)
Eosinophils Relative: 1 %
HCT: 35.3 % — ABNORMAL LOW (ref 39.0–52.0)
Hemoglobin: 12.9 g/dL — ABNORMAL LOW (ref 13.0–17.0)
Immature Granulocytes: 1 %
Lymphocytes Relative: 20 %
Lymphs Abs: 1.8 10*3/uL (ref 0.7–4.0)
MCH: 31.5 pg (ref 26.0–34.0)
MCHC: 36.5 g/dL — ABNORMAL HIGH (ref 30.0–36.0)
MCV: 86.1 fL (ref 80.0–100.0)
Monocytes Absolute: 1.1 10*3/uL — ABNORMAL HIGH (ref 0.1–1.0)
Monocytes Relative: 13 %
Neutro Abs: 5.7 10*3/uL (ref 1.7–7.7)
Neutrophils Relative %: 64 %
Platelets: 413 10*3/uL — ABNORMAL HIGH (ref 150–400)
RBC: 4.1 MIL/uL — ABNORMAL LOW (ref 4.22–5.81)
RDW: 14.9 % (ref 11.5–15.5)
WBC: 8.7 10*3/uL (ref 4.0–10.5)
nRBC: 0.2 % (ref 0.0–0.2)

## 2020-08-07 LAB — BRAIN NATRIURETIC PEPTIDE: B Natriuretic Peptide: 11.5 pg/mL (ref 0.0–100.0)

## 2020-08-07 LAB — TROPONIN I (HIGH SENSITIVITY): Troponin I (High Sensitivity): 5 ng/L (ref <18)

## 2020-08-07 LAB — C-REACTIVE PROTEIN: CRP: 0.6 mg/dL (ref <1.0)

## 2020-08-07 IMAGING — CR DG CHEST 2V
1 series · 2 of 2 positions shown · non-contrast
Comparison: Chest x-ray dated [DATE]. Chest CT angiogram
performed earlier same day.

CLINICAL DATA: Swollen area to RIGHT shoulder for the past week.
Known lymphoma.

EXAM:
CHEST - 2 VIEW

[Series 1: dg chest 2 view · 0.14mm/px · 2 of 2 slices shown]
[im 1/2]
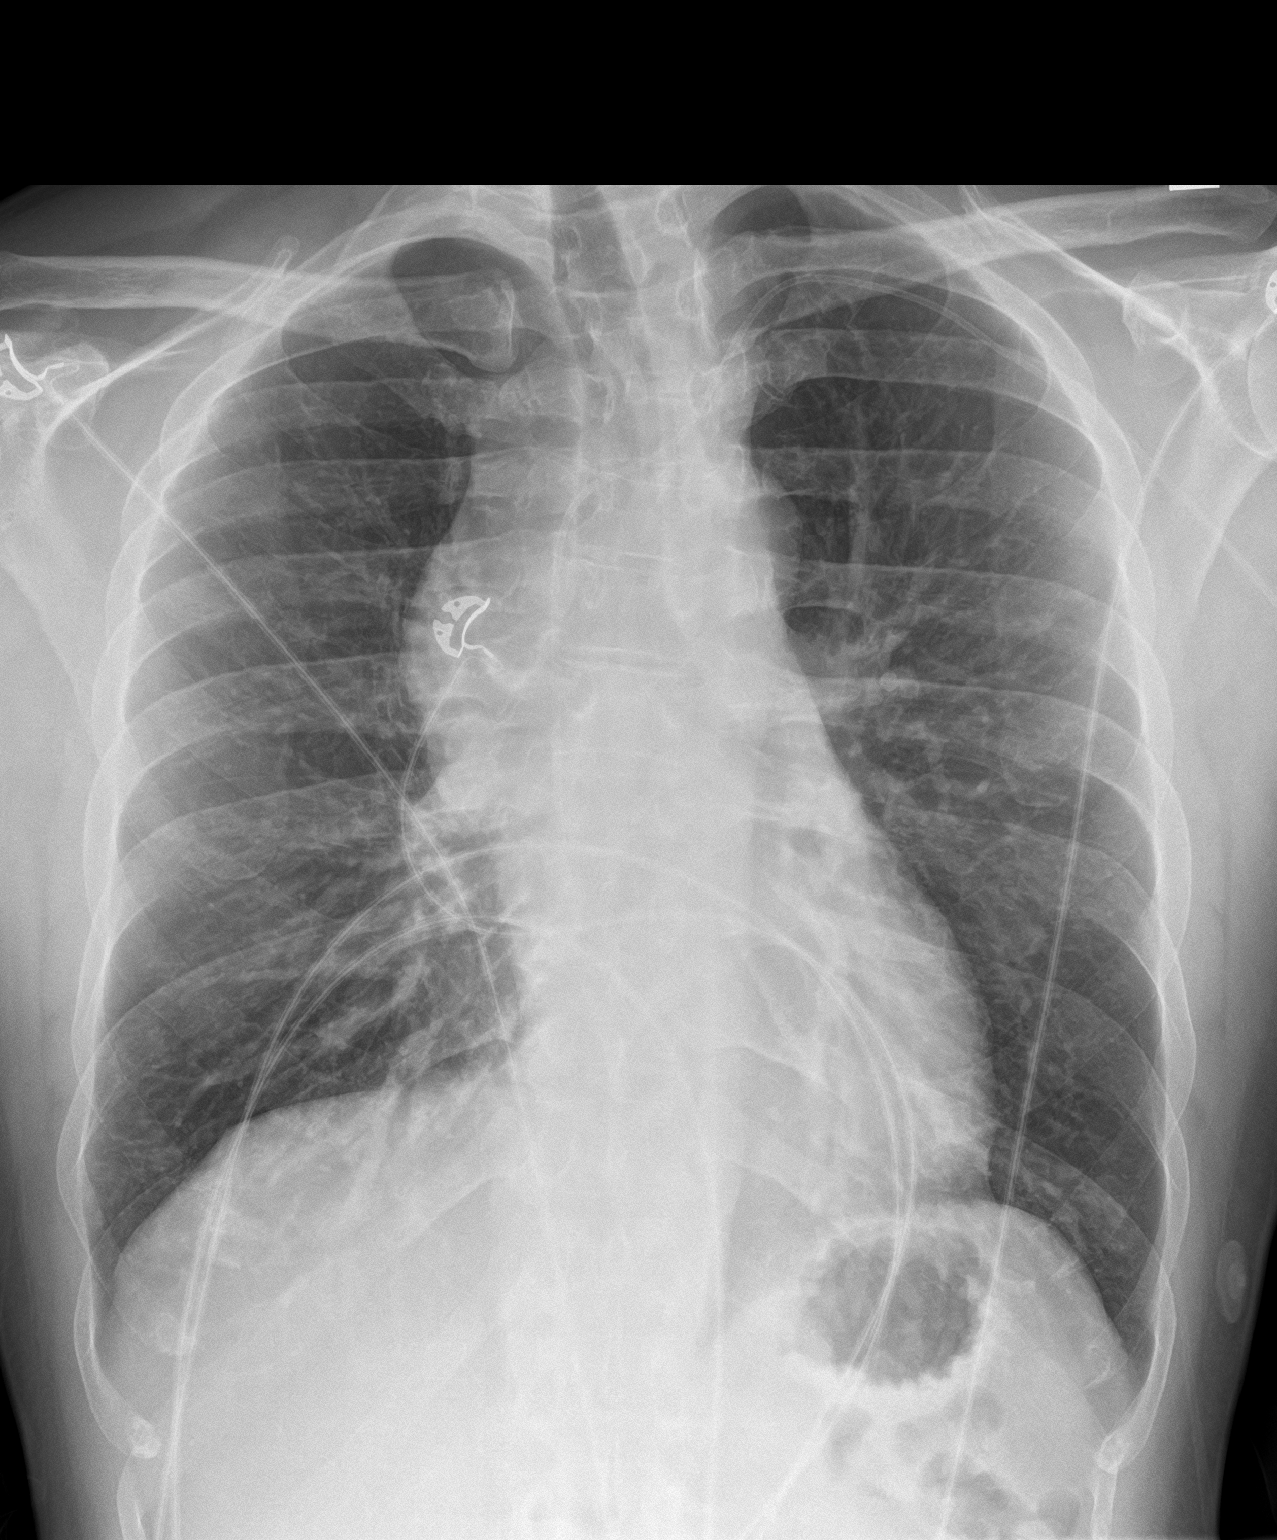
[im 2/2]
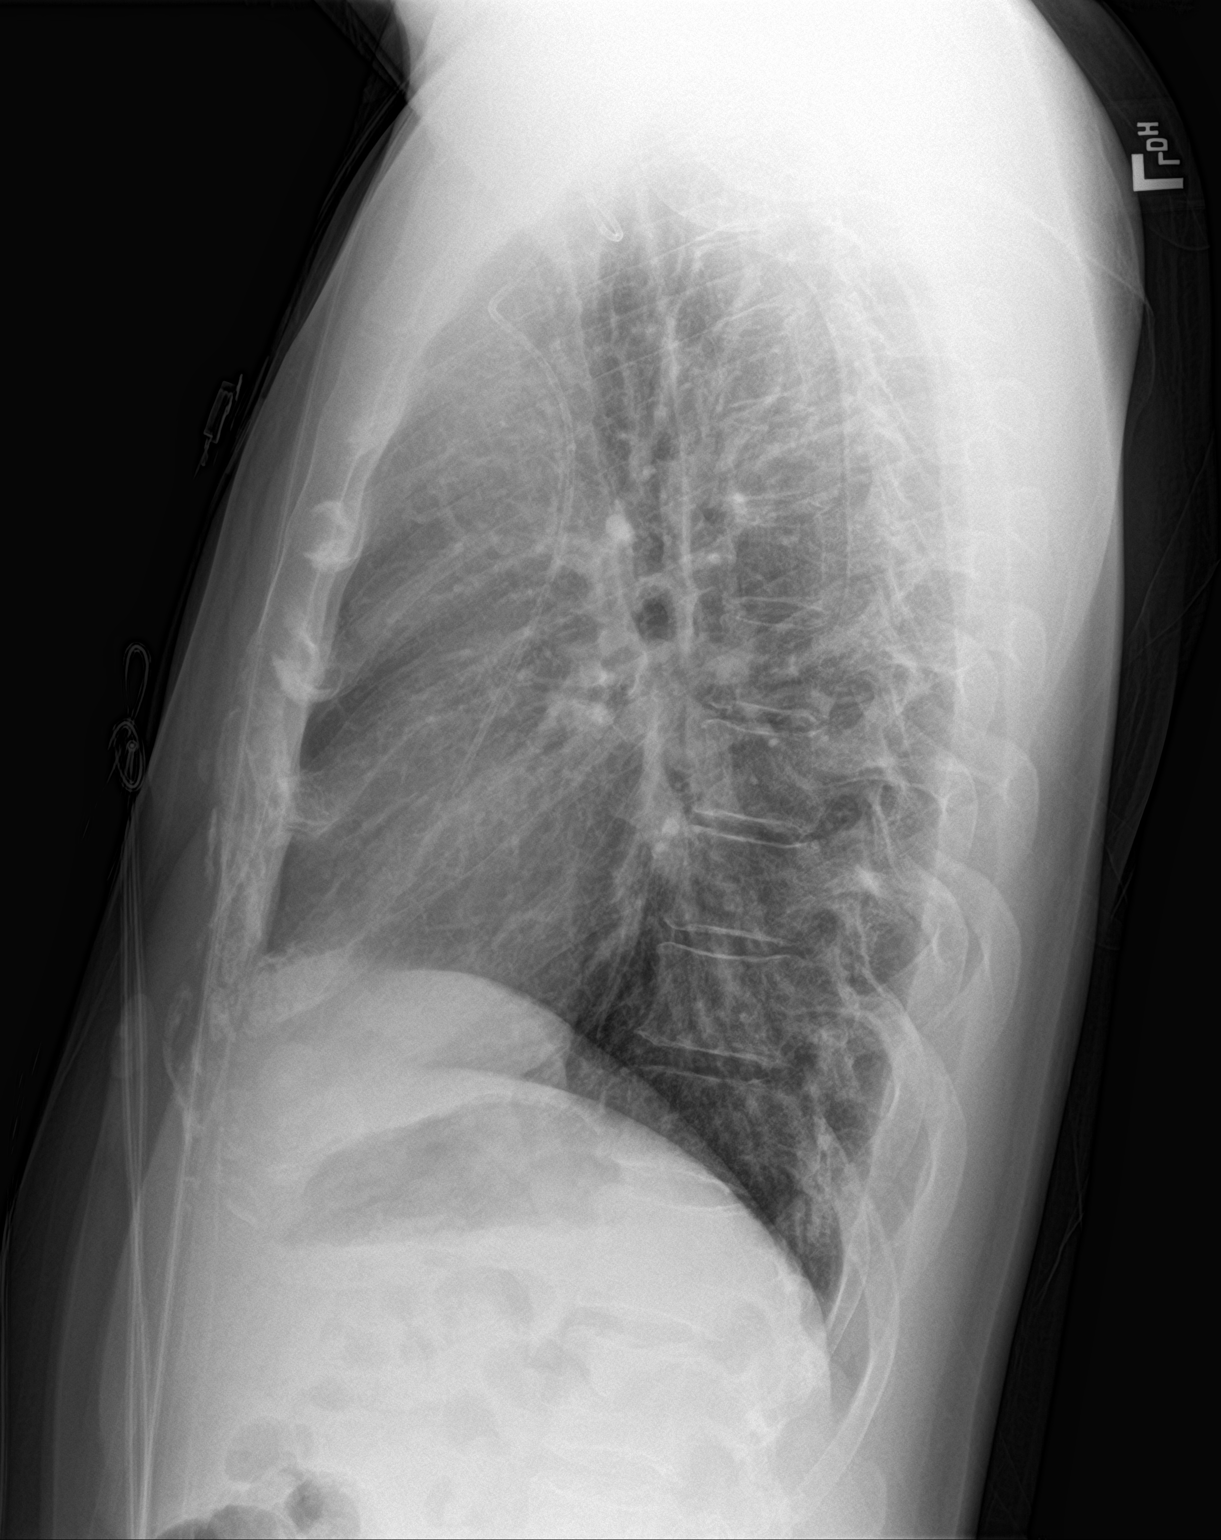

[2 of 2 positions shown; findings below may reference images not displayed]

FINDINGS: Heart size and mediastinal contours are stable. LEFT-sided PICC line
appears well positioned with tip at the level of the lower SVC.
Lungs are clear. No pleural effusion or pneumothorax is seen. No
acute appearing osseous abnormality.
IMPRESSION: 1. No active cardiopulmonary disease. No evidence of pneumonia or
pulmonary edema.
2. LEFT-sided PICC line appears well positioned with tip at the
level of the lower SVC.
3. RIGHT-sided mediastinal mass, better demonstrated on today's
earlier chest CT angiogram, compatible with mediastinal
lymphadenopathy related to known history of lymphoma.

## 2020-08-07 IMAGING — CT CT ANGIO CHEST
2 of 6 series · 18 of 46 positions shown · IV contrast (APPLIED)
Comparison: [DATE]

CLINICAL DATA: Chest and shoulder pain and swelling. Lymphoma.
Question pulmonary emboli.

EXAM:
CT ANGIOGRAPHY CHEST WITH CONTRAST
TECHNIQUE: Multidetector CT imaging of the chest was performed using the
standard protocol during bolus administration of intravenous
contrast. Multiplanar CT image reconstructions and MIPs were
obtained to evaluate the vascular anatomy.
CONTRAST:  <See Chart> OMNIPAQUE IOHEXOL 300 MG/ML SOLN, 75mL
OMNIPAQUE IOHEXOL 350 MG/ML SOLN

[Series 5: thins · axial · 0.70mm/px · z∈[-652,-362]mm · 15 of 318 slices shown]
[im 14/318  lung]
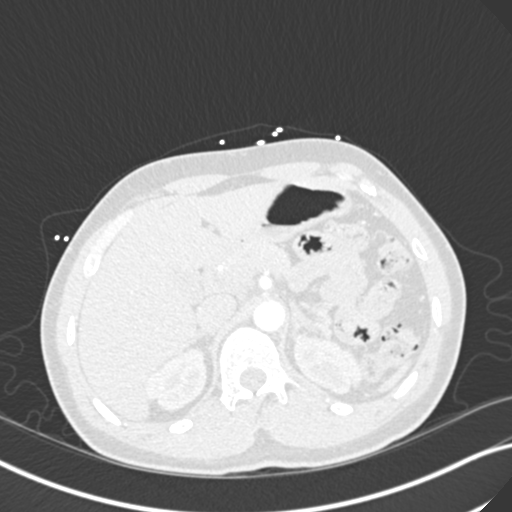
[im 42/318  soft-tissue]
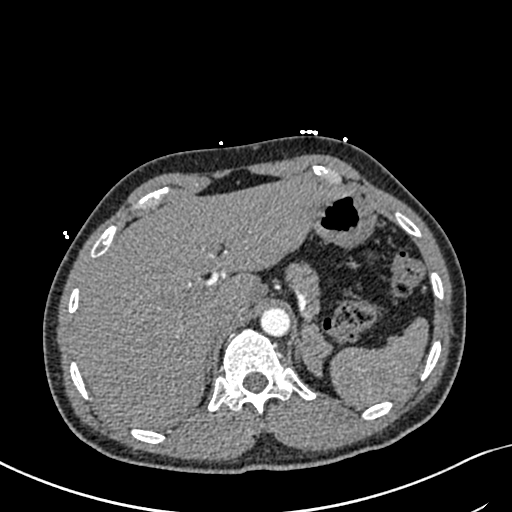
[im 56/318  lung]
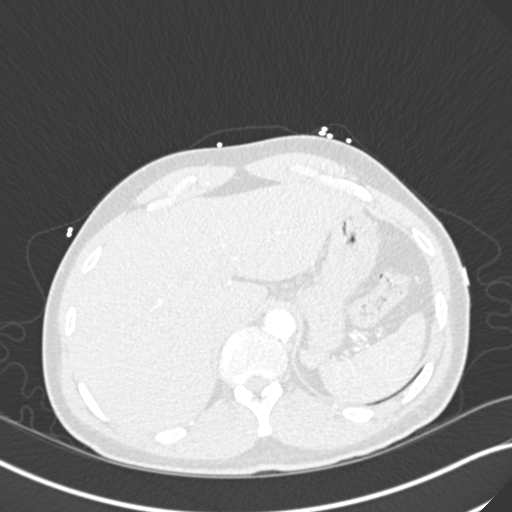
[im 83/318  soft-tissue]
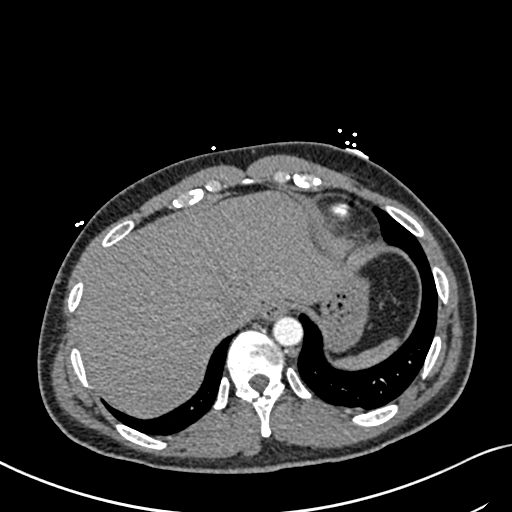
[im 97/318  lung]
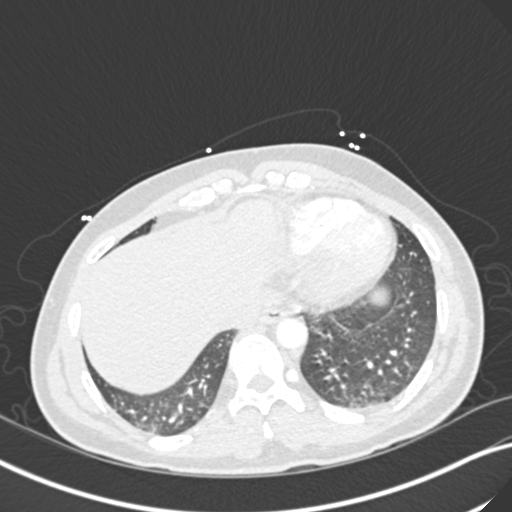
[im 125/318  soft-tissue]
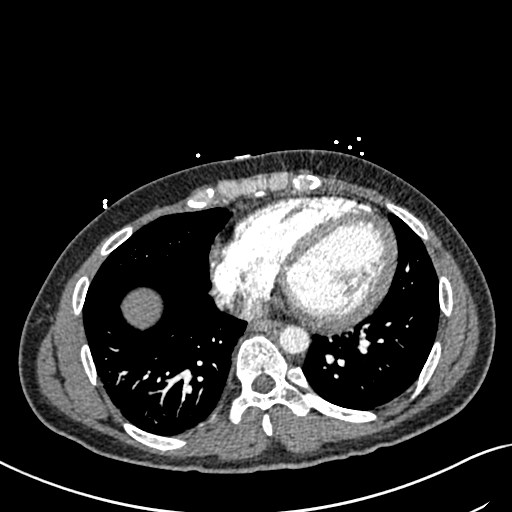
[im 138/318  lung]
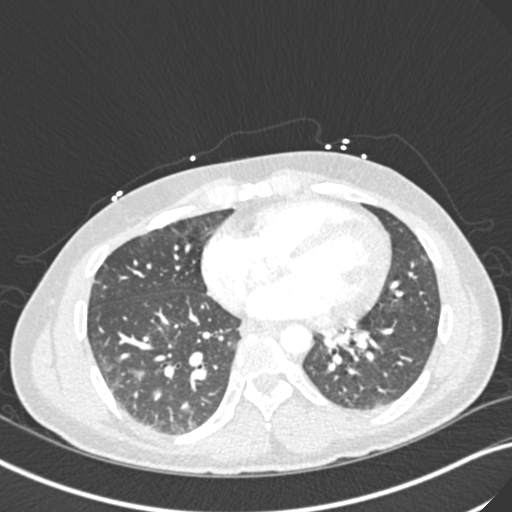
[im 166/318  soft-tissue]
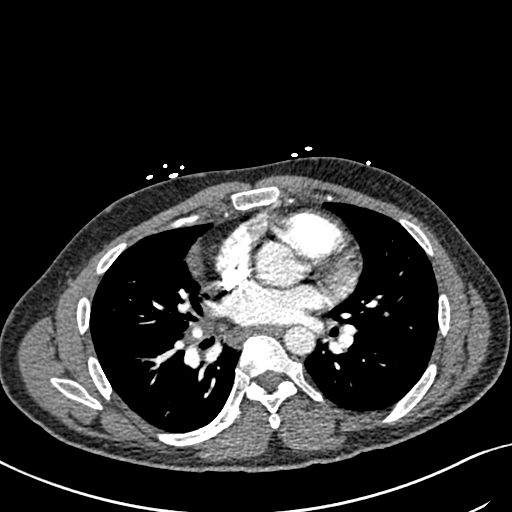
[im 180/318  lung]
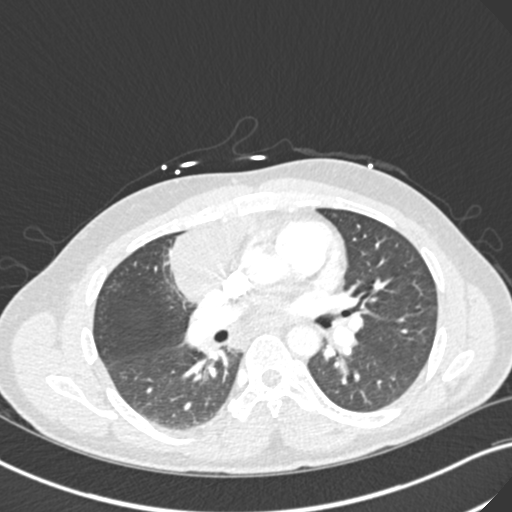
[im 193/318  soft-tissue]
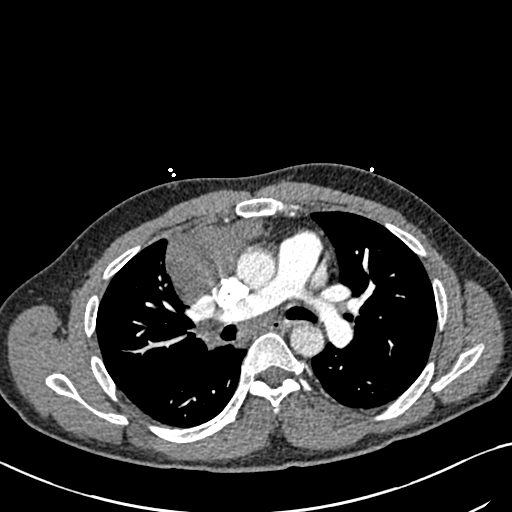
[im 221/318  lung]
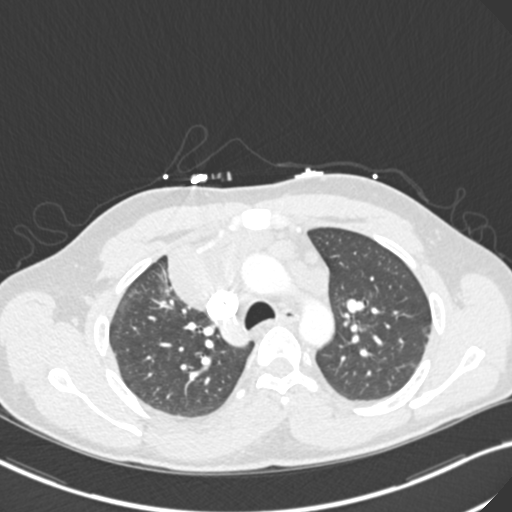
[im 235/318  soft-tissue]
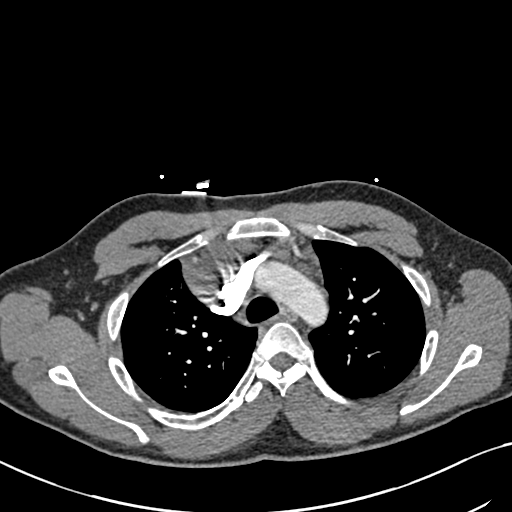
[im 262/318  lung]
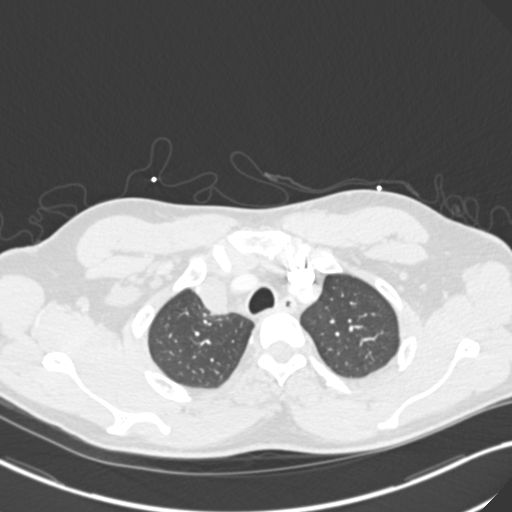
[im 276/318  soft-tissue]
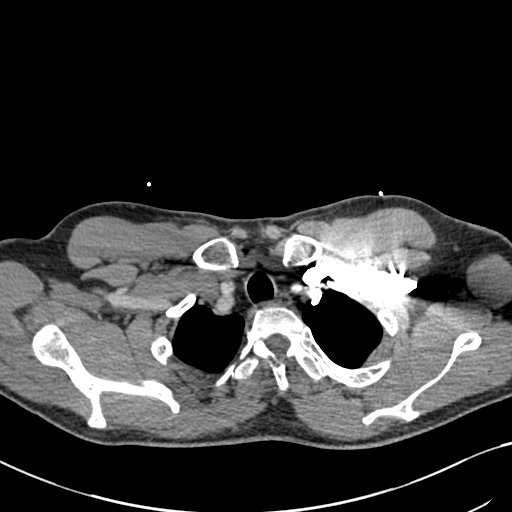
[im 304/318  lung]
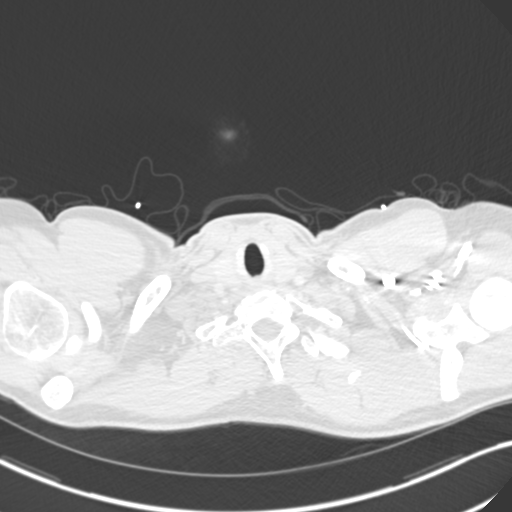

[Series 7: coronal mpr · coronal · 0.59mm/px · 3 of 80 slices shown]
[im 20/80  soft-tissue]
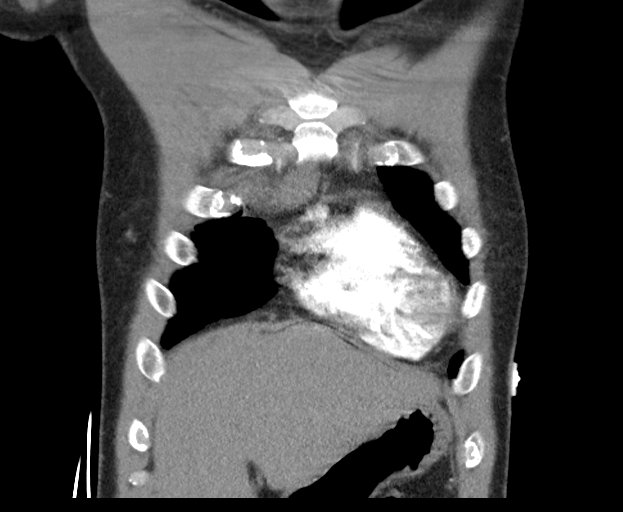
[im 40/80  soft-tissue]
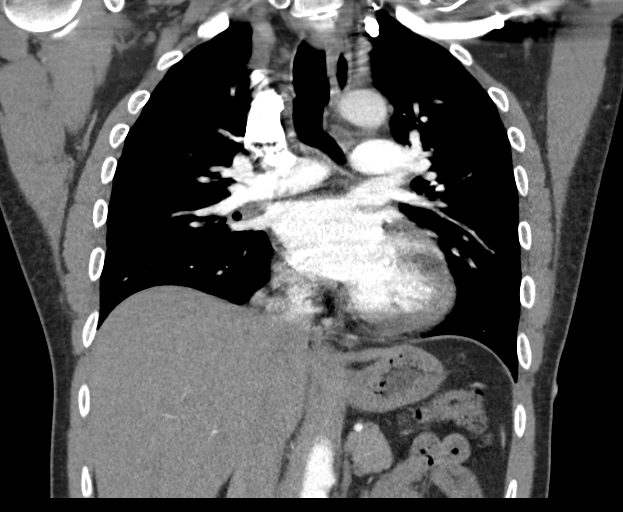
[im 60/80  soft-tissue]
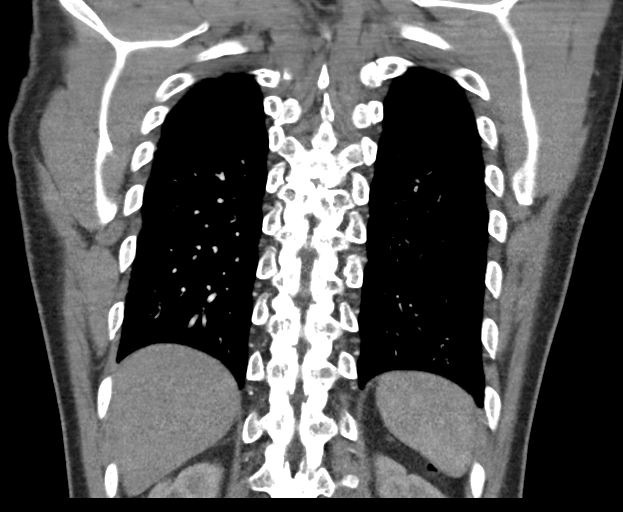

[18 of 46 positions shown; findings below may reference images not displayed]

FINDINGS: Cardiovascular: Heart size is normal. No pericardial fluid. No
definite coronary artery calcification. No aortic atherosclerotic
calcification. No dissection. Pulmonary arterial opacification is
good. There are no pulmonary emboli.

Mediastinum/Nodes: Superior mediastinal lymphadenopathy. Anterior
mediastinal mass projecting more to the right of center measures
cm cephalo caudal, 8.3 cm right to left and 3.8 cm front to back,
consistent with the clinical history of lymphoma. Several other
smaller but pathologic lymph nodes are noted in the anterior
mediastinum, second largest at the aortopulmonary window measuring
1.3 x 1.9 x 3.1 cm. No enlarged axillary nodes on either side. No
enlarged supraclavicular nodes on either side.

Lungs/Pleura: No pleural effusion. No pulmonary mass or nodule. No
infiltrate. Mild compressive atelectasis of the medial anterior
right lung because of the mediastinal mass per

Upper Abdomen: Negative. No adenopathy or upper abdominal organ
pathology.

Musculoskeletal: Mild thoracic scoliotic curvature. No focal bone
lesion.

Review of the MIP images confirms the above findings.
IMPRESSION: 1. No pulmonary emboli or other acute chest vascular pathology.
2. Superior mediastinal lymphadenopathy consistent with the clinical
history of lymphoma. Largest node measures 8.3 x 8.3 x 3.8 cm.
Several other smaller but pathologic lymph nodes in the anterior
mediastinum, second largest at the aortopulmonary window measuring
1.3 x 1.9 x 3.1 cm.
3. No pulmonary mass or nodule. Mild compressive atelectasis of the
medial anterior right lung because of the mediastinal mass.

## 2020-08-07 IMAGING — CR DG SHOULDER 2+V*L*
1 series · 3 of 3 positions shown · non-contrast
Comparison: PET-CT from [DATE]

CLINICAL DATA: Tenderness and swelling of the left shoulder.

EXAM:
LEFT SHOULDER - 2+ VIEW

[Series 1: dg shoulder left · 0.14mm/px · 3 of 3 slices shown]
[im 1/3]
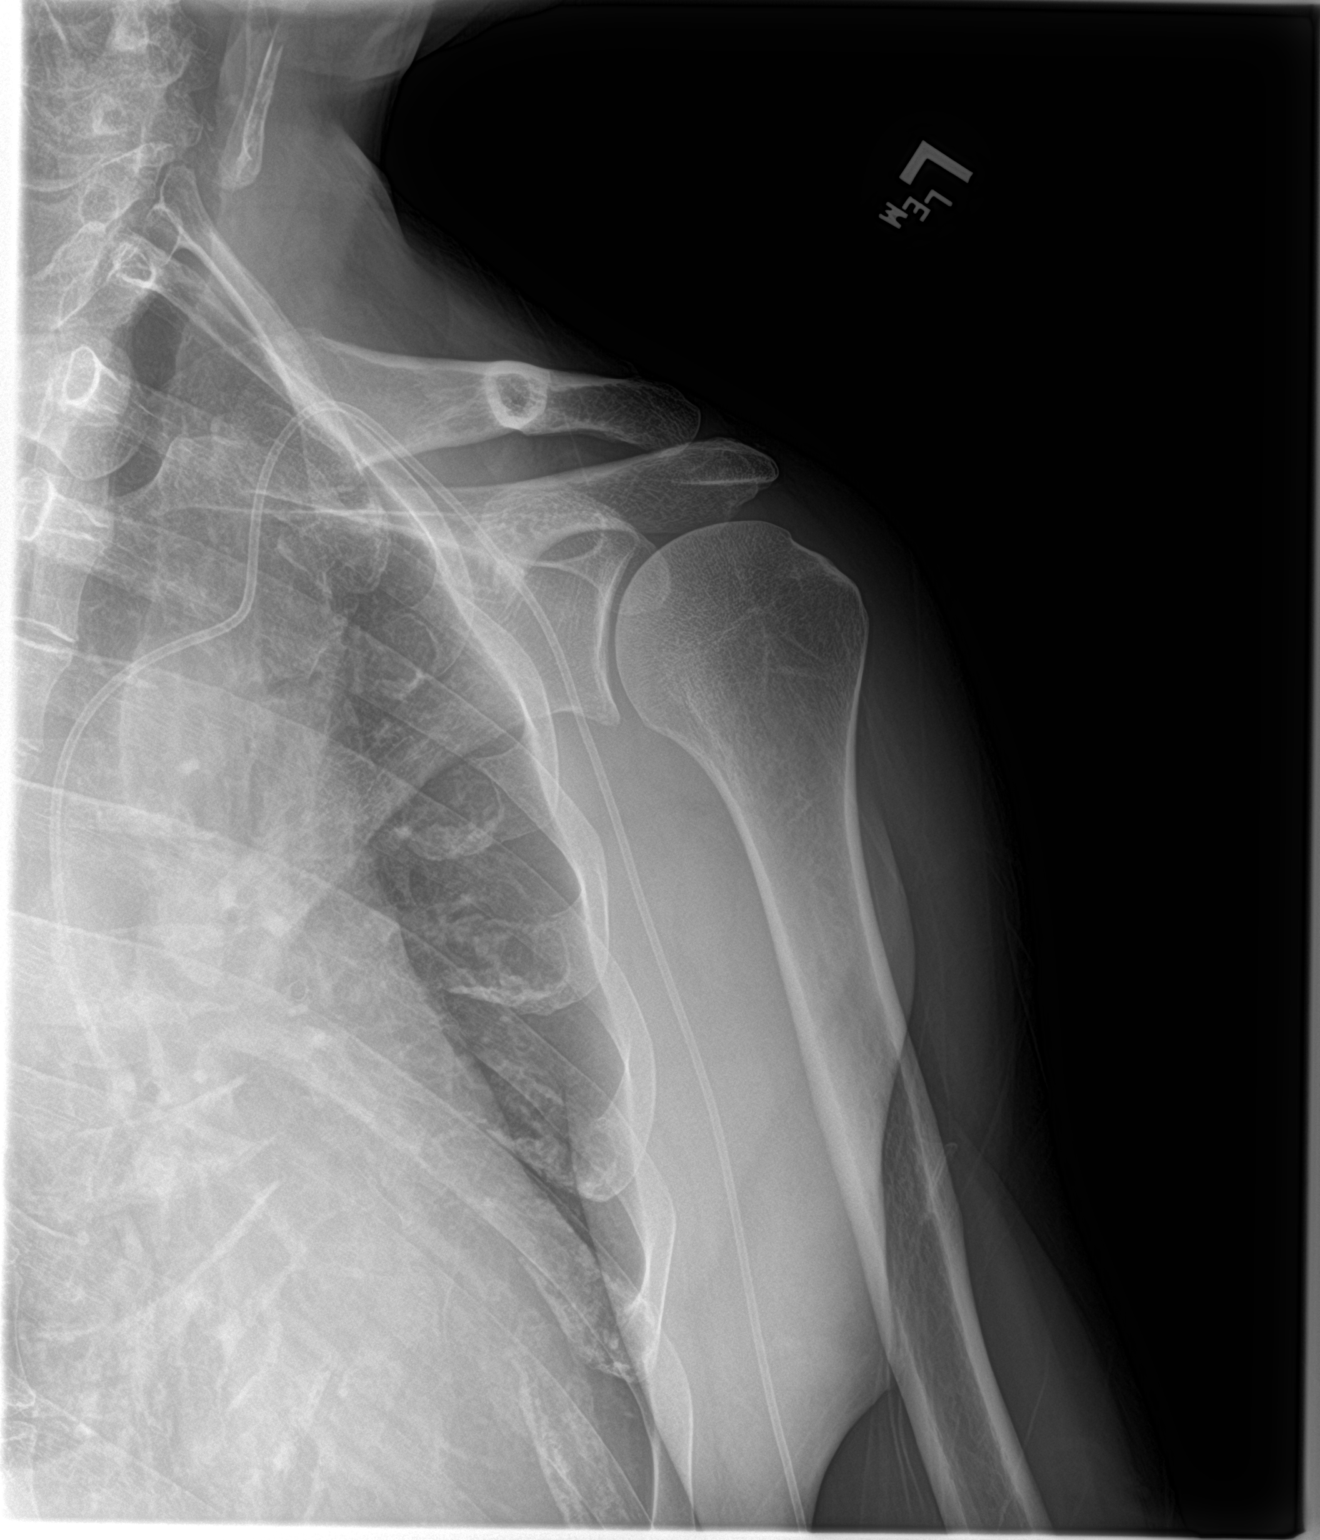
[im 2/3]
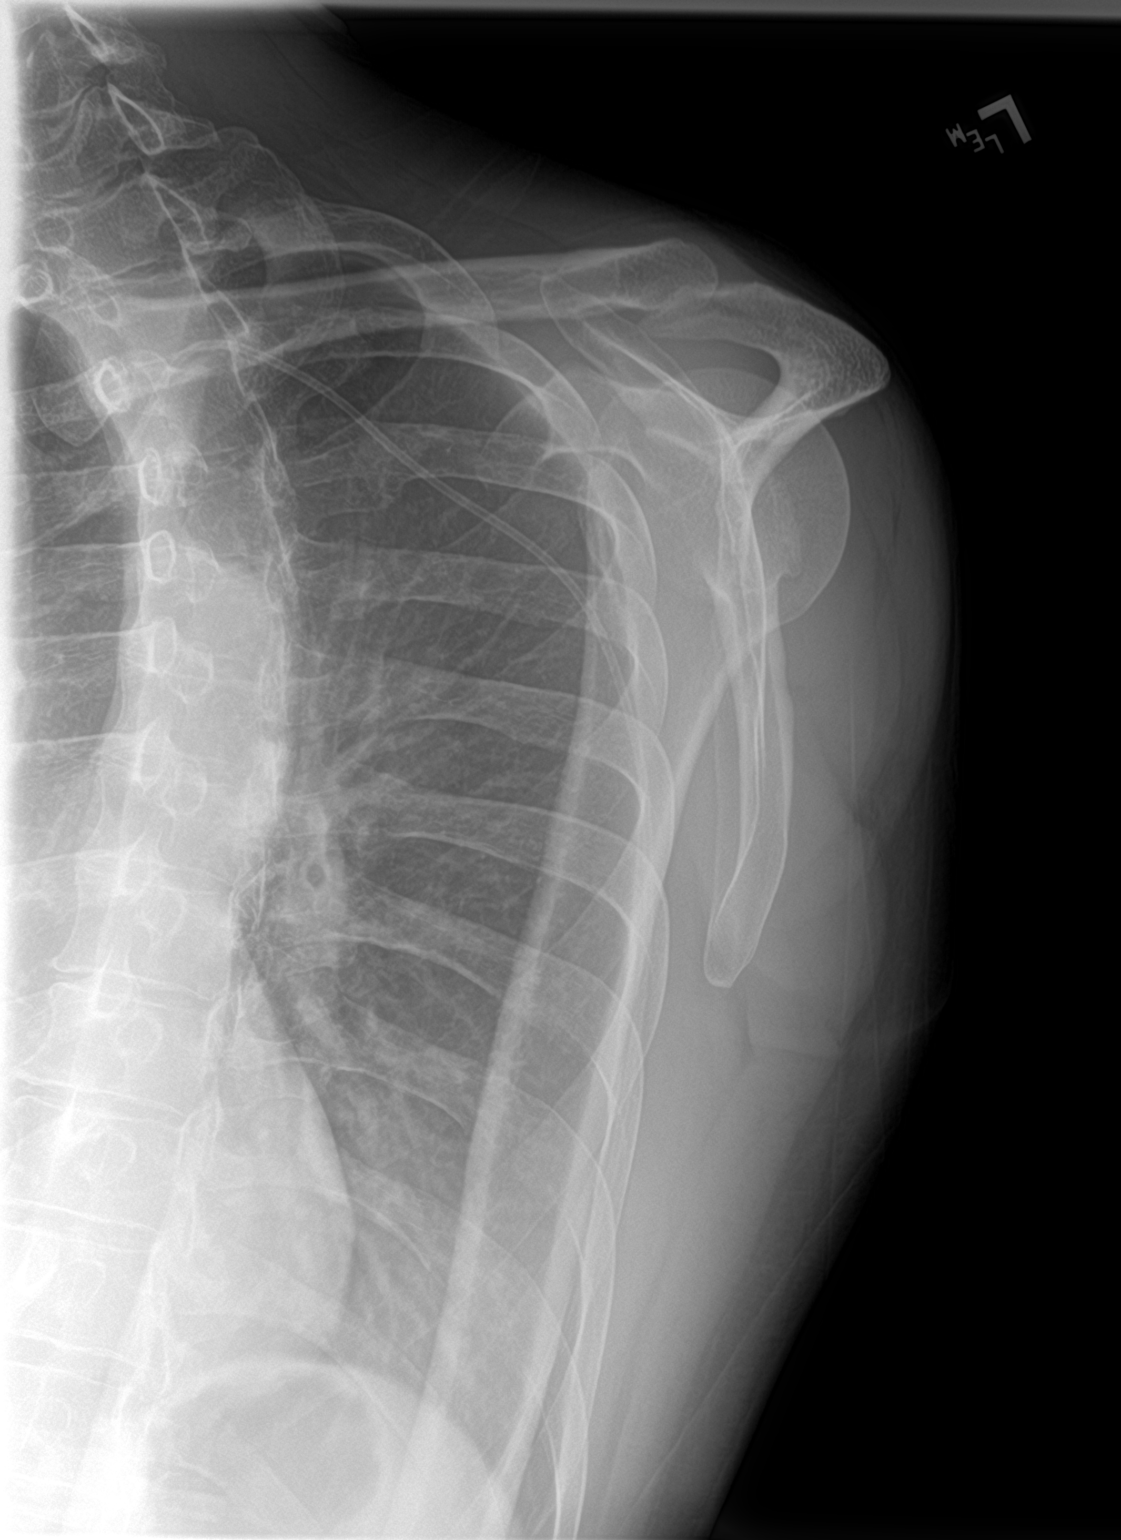
[im 3/3]
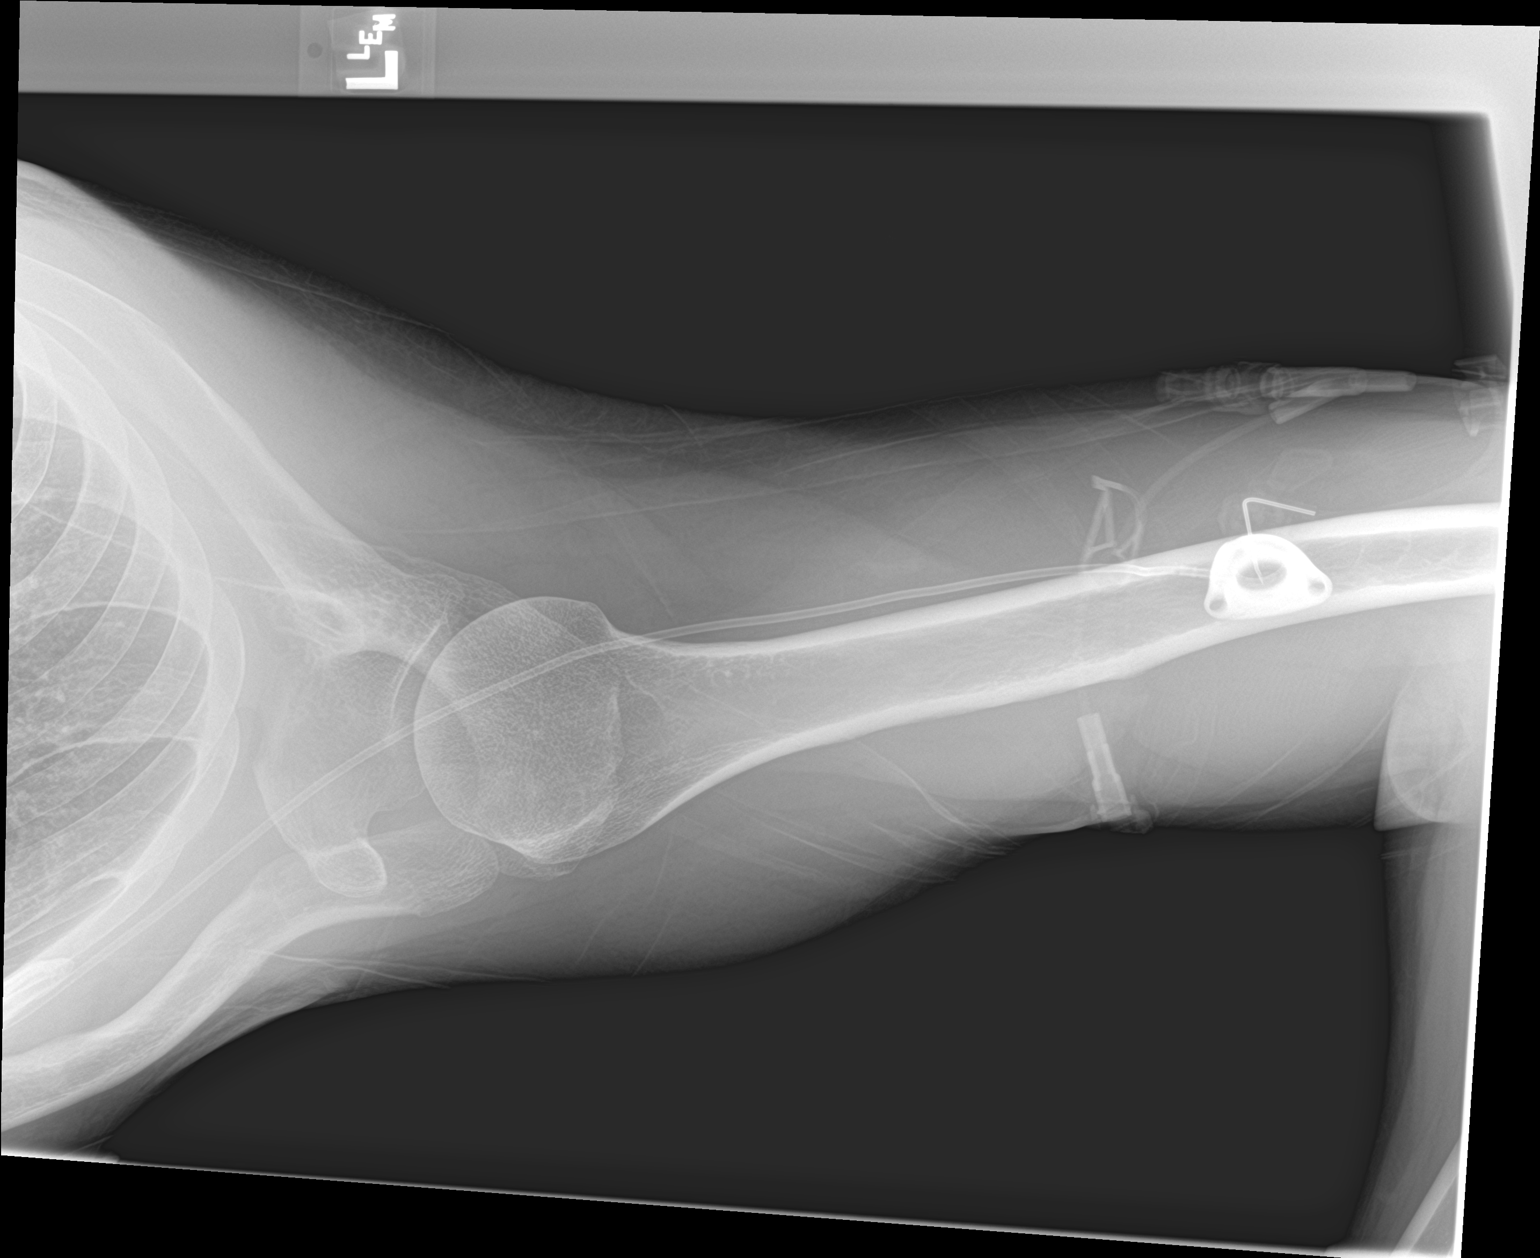

[3 of 3 positions shown; findings below may reference images not displayed]

FINDINGS: Left upper extremity port noted. No obvious discontinuity in the
tubing.

No fracture or acute bony findings are observed. No significant
arthropathy.
IMPRESSION: 1. No significant abnormality identified. A left upper extremity
port is noted with tubing extending from the distal humeral level of
the port along the left upper extremity to the SVC. If there is
clinical concern for possible thrombus, consider left upper
extremity ultrasound.

## 2020-08-07 IMAGING — US US EXTREM  UP VENOUS*L*
1 series · 13 of 24 positions shown · non-contrast
Comparison: None.

CLINICAL DATA: Port in place.  Left-sided pain.



[Series 1: us venous img upper uni left (dvt) · portal-venous · 13 of 50 slices shown]
[im 1/50]
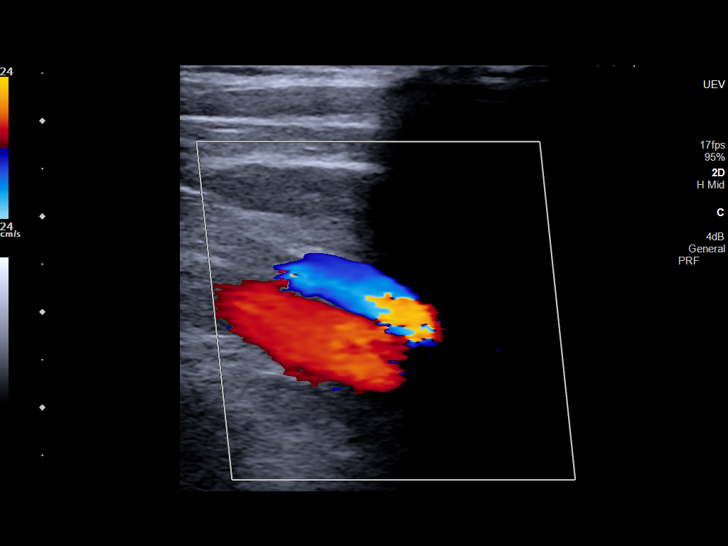
[im 5/50]
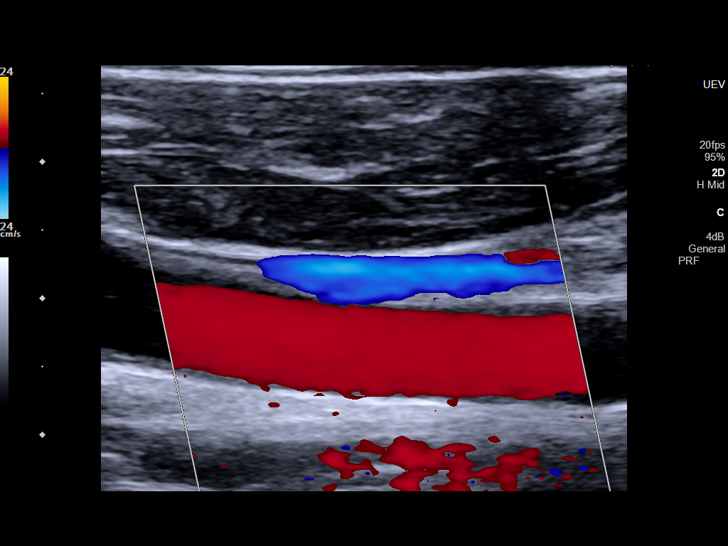
[im 9/50]
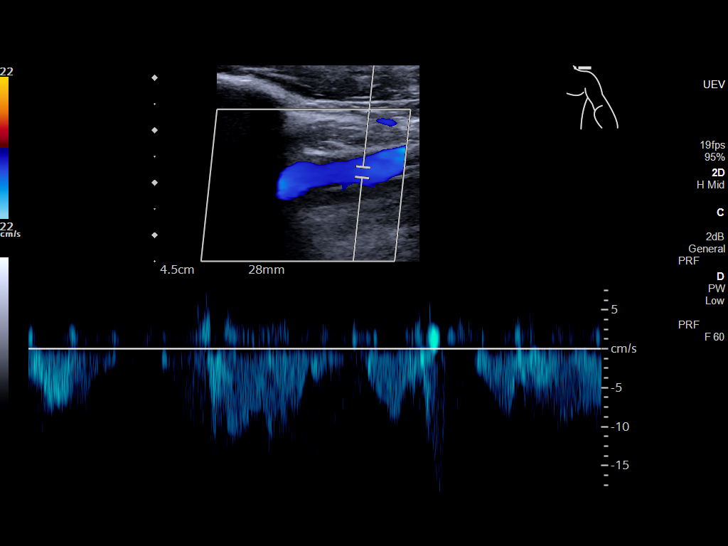
[im 13/50]
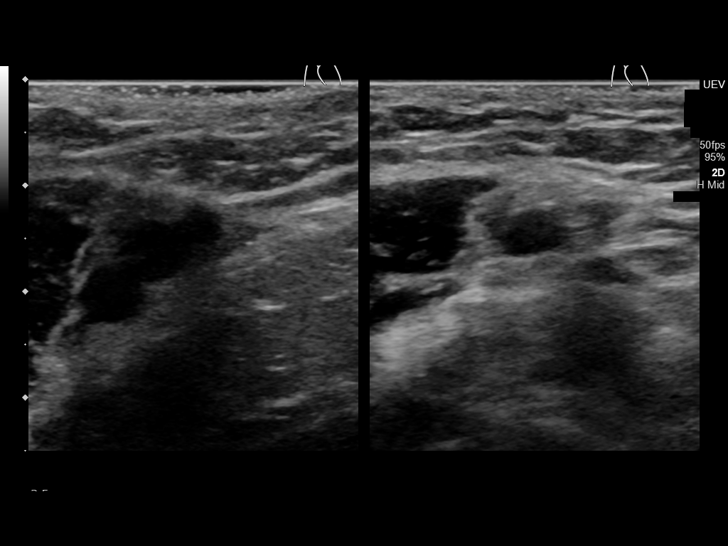
[im 18/50]
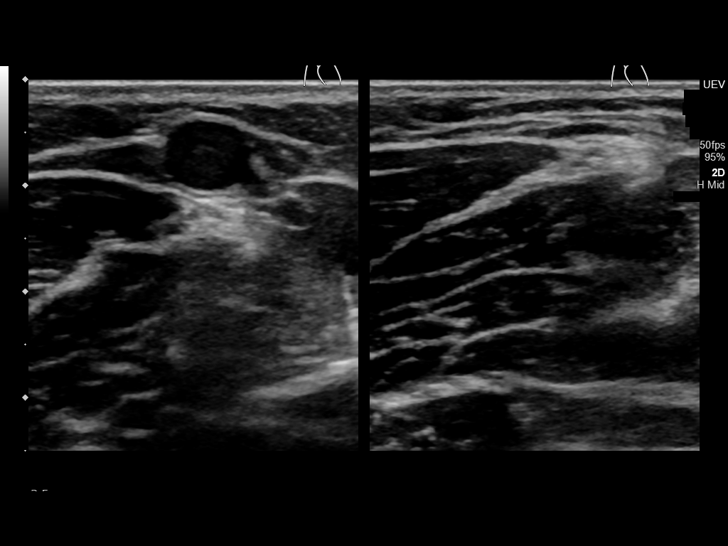
[im 22/50]
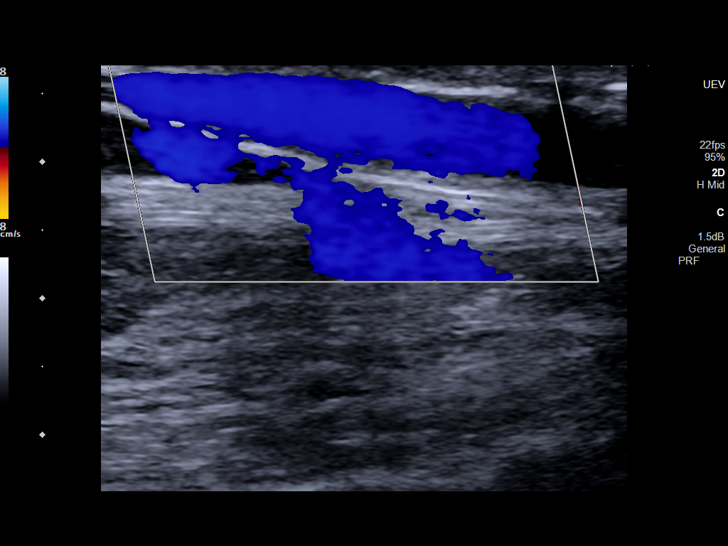
[im 26/50]
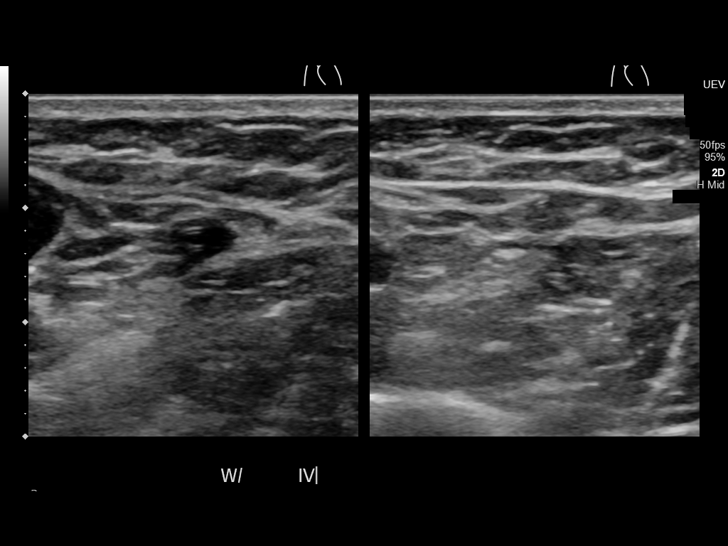
[im 28/50]
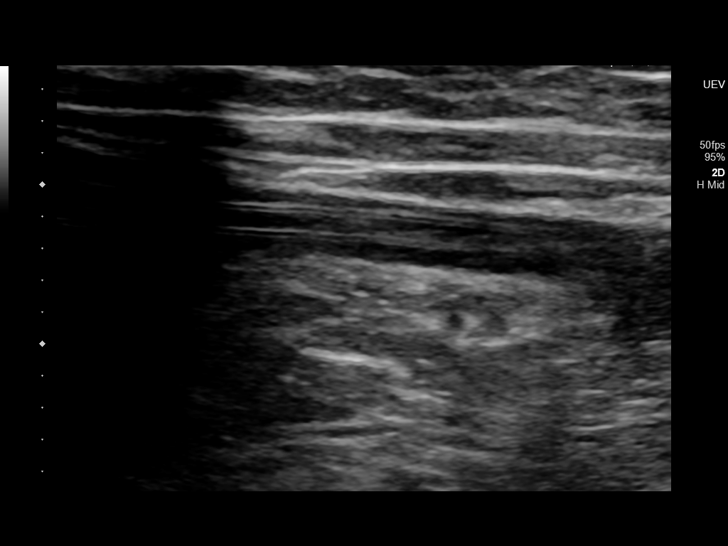
[im 32/50]
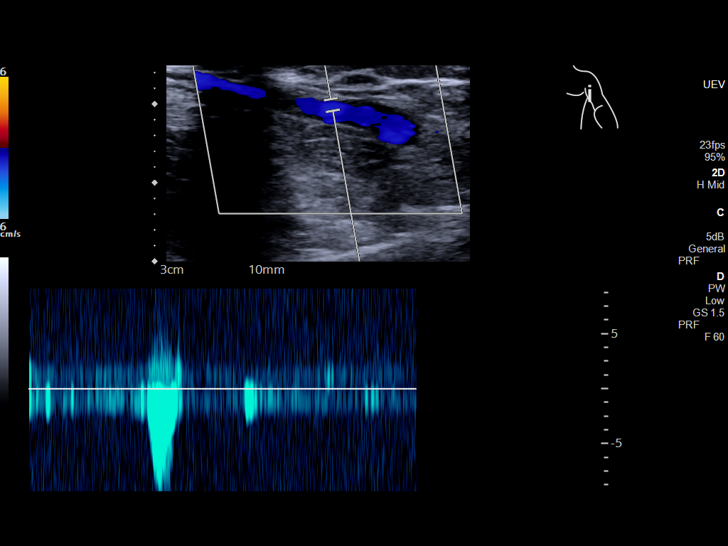
[im 37/50]
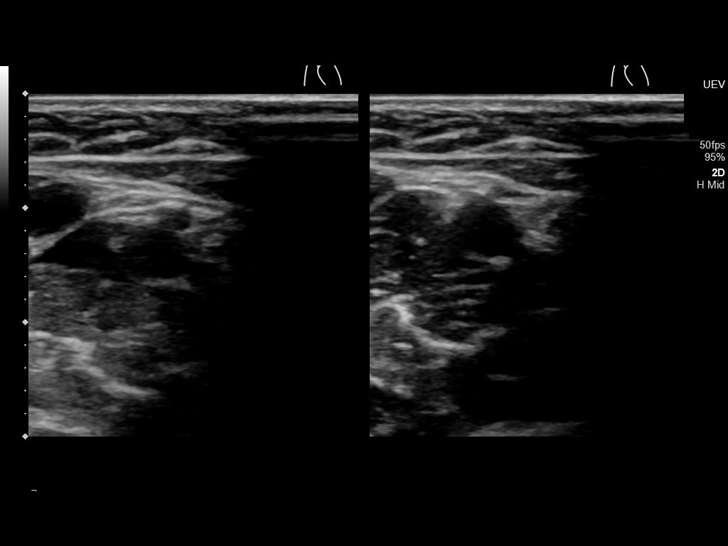
[im 41/50]
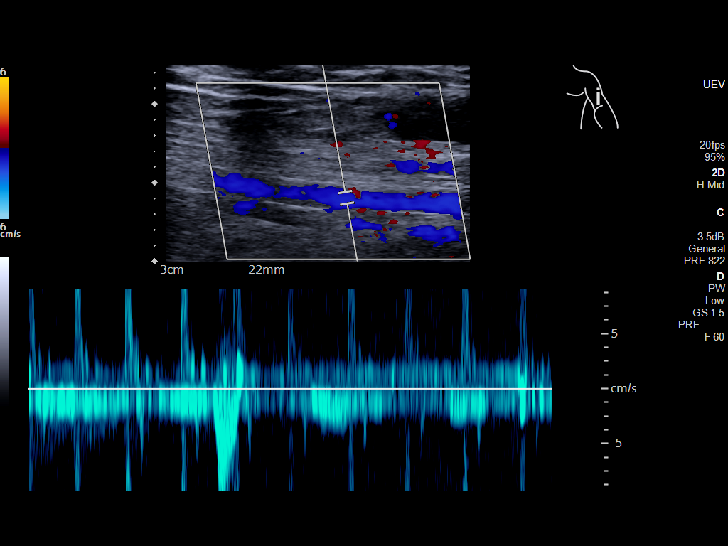
[im 45/50]
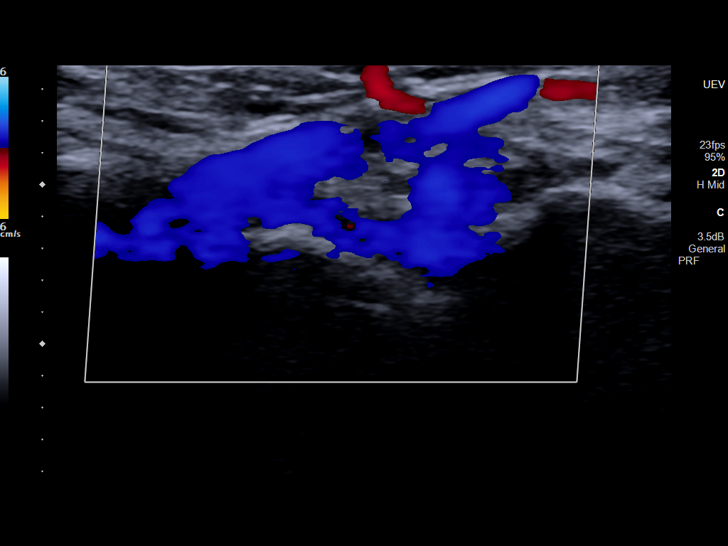
[im 50/50]
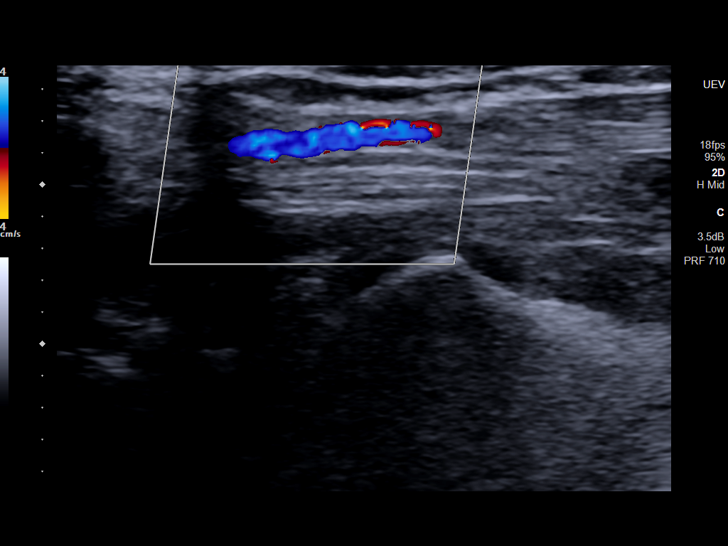

[13 of 24 positions shown; findings below may reference images not displayed]

FINDINGS: Contralateral Subclavian Vein: Respiratory phasicity is normal and
symmetric with the symptomatic side. No evidence of thrombus. Normal
compressibility.

Internal Jugular Vein: No evidence of thrombus. Normal
compressibility, respiratory phasicity and response to augmentation.

Subclavian Vein: No evidence of thrombus. Normal compressibility,
respiratory phasicity and response to augmentation.

Axillary Vein: No evidence of thrombus. Normal compressibility,
respiratory phasicity and response to augmentation.

Cephalic Vein: No evidence of thrombus. Normal compressibility,
respiratory phasicity and response to augmentation.

Basilic Vein: No evidence of thrombus. Normal compressibility,
respiratory phasicity and response to augmentation. Venous catheter
noted in place.

Brachial Veins: No evidence of thrombus. Normal compressibility,
respiratory phasicity and response to augmentation.

Radial Veins: No evidence of thrombus. Normal compressibility,
respiratory phasicity and response to augmentation.

Ulnar Veins: No evidence of thrombus. Normal compressibility,
respiratory phasicity and response to augmentation.

Venous Reflux:  None visualized.

Other Findings:  None visualized.
IMPRESSION: No evidence of DVT within the left upper extremity.

## 2020-08-07 MED ORDER — MORPHINE SULFATE (PF) 4 MG/ML IV SOLN
4.0000 mg | Freq: Once | INTRAVENOUS | Status: AC
  Administered 2020-08-07: 13:00:00 4 mg via INTRAVENOUS
  Filled 2020-08-07: qty 1

## 2020-08-07 MED ORDER — IOHEXOL 300 MG/ML  SOLN
75.0000 mL | Freq: Once | INTRAMUSCULAR | Status: DC | PRN
  Filled 2020-08-07: qty 75

## 2020-08-07 MED ORDER — SODIUM CHLORIDE 0.9% FLUSH
10.0000 mL | INTRAVENOUS | Status: AC | PRN
  Administered 2020-08-07: 08:00:00 10 mL via INTRAVENOUS
  Filled 2020-08-07: qty 10

## 2020-08-07 MED ORDER — HEPARIN SOD (PORK) LOCK FLUSH 100 UNIT/ML IV SOLN
500.0000 [IU] | Freq: Once | INTRAVENOUS | Status: AC
  Filled 2020-08-07: qty 5

## 2020-08-07 MED ORDER — IOHEXOL 350 MG/ML SOLN
75.0000 mL | Freq: Once | INTRAVENOUS | Status: AC | PRN
  Administered 2020-08-07: 75 mL via INTRAVENOUS
  Filled 2020-08-07: qty 75

## 2020-08-07 MED ORDER — OXYCODONE-ACETAMINOPHEN 5-325 MG PO TABS: 1.0000 | ORAL_TABLET | ORAL | 0 refills | Status: DC | PRN

## 2020-08-07 NOTE — ED Triage Notes (Addendum)
Pt c/o tender swollen area to the right shoulder for the past week., no redness noted. Pt was sent from the cancer center, states he was suppose to get his chemo treatment for lymphoma today but they sent him here for eval

## 2020-08-07 NOTE — ED Notes (Signed)
Port deaccessed after heparin flush. Guaze applied. Area clean and dry, no bleeding at this time.

## 2020-08-07 NOTE — Progress Notes (Signed)
Pt here for follow up. Pt complains of pain to left arm, shoulder, back and chest. Pain is 8/10. Pt also reports occasional numbness to left arm

## 2020-08-07 NOTE — ED Provider Notes (Signed)
[facility] Emergency Department Provider Note   ____________________________________________   Event Date/Time   First MD Initiated Contact with Patient 08/07/20 1147     (approximate)  I have reviewed the triage vital signs and the nursing notes.   HISTORY  Chief Complaint Shoulder Pain    HPI Preston [name] is a 34 y.o. male with past medical history of Hodgkin's lymphoma who presents to the ED complaining of chest and shoulder pain.  Patient reports that for the past week he has developed increasing pain around his left shoulder blade, left chest, and left shoulder.  He describes it as a soreness that is worse when he coughs, takes a deep breath, or moves his left arm.  He has had occasional numbness and tingling in his left arm, but denies any weakness.  He has not had any recent trauma to the area.  He describes it as similar to what he experienced 2 years ago, when he was incarcerated, but pain resolved on its own at that time.  He has not had any fevers but does endorse a nonproductive cough, denies shortness of breath.  He has not noticed any pain or swelling in his legs.  He does have a Mediport placed in his left upper arm that is currently accessed, was scheduled to receive a chemotherapy treatment earlier today but was sent to the ED for further evaluation.        Past Medical History:  Diagnosis Date  . Hodgkin's lymphoma ([place])   . Tobacco use     Patient Active Problem List   Diagnosis Date Noted  . Encounter for antineoplastic chemotherapy 07/10/2020  . Tobacco use 05/17/2020  . Goals of care, counseling/discussion 05/17/2020  . Malignant lymphoma, Hodgkin's type ([place]) 12/30/2019    History reviewed. No pertinent surgical history.  Prior to Admission medications   Medication Sig Start Date End Date Taking? Authorizing Provider  oxyCODONE-acetaminophen (PERCOCET) 5-325 MG tablet Take 1 tablet by mouth every 4 (four) hours as  needed for severe pain. 08/07/20 08/07/21 Yes Blake Divine, MD  lidocaine-prilocaine (EMLA) cream Apply 1 application topically as needed. 06/26/20   Earlie Server, MD  ondansetron (ZOFRAN) 8 MG tablet Take 1 tablet (8 mg total) by mouth 2 (two) times daily. 06/26/20   Earlie Server, MD  prochlorperazine (COMPAZINE) 10 MG tablet Take 1 tablet (10 mg total) by mouth every 6 (six) hours as needed for nausea or vomiting. 06/26/20   Earlie Server, MD    Allergies Patient has no known allergies.  Family History  Problem Relation Age of Onset  . Heart failure Mother   . Pneumonia Father     Social History Social History   Tobacco Use  . Smoking status: Current Every Day Smoker    Packs/day: 0.50    Years: 20.00    Pack years: 10.00  . Smokeless tobacco: Never Used  Vaping Use  . Vaping Use: Never used  Substance Use Topics  . Alcohol use: Yes  . Drug use: Not Currently    Review of Systems  Constitutional: No fever/chills Eyes: No visual changes. ENT: No sore throat. Cardiovascular: Positive for chest pain. Respiratory: Denies shortness of breath.  Positive for cough. Gastrointestinal: No abdominal pain.  No nausea, no vomiting.  No diarrhea.  No constipation. Genitourinary: Negative for dysuria. Musculoskeletal: Positive for back and left shoulder pain.  Negative for neck pain. Skin: Negative for rash. Neurological: Negative for headaches, focal weakness or numbness.  ____________________________________________  PHYSICAL EXAM:  VITAL SIGNS: ED Triage Vitals  Enc Vitals Group     BP 08/07/20 1001 (!) 138/93     Pulse Rate 08/07/20 1001 (!) 105     Resp 08/07/20 1001 18     Temp 08/07/20 1001 98.3 F (36.8 C)     Temp Source 08/07/20 1001 Oral     SpO2 08/07/20 1001 98 %     Weight 08/07/20 1005 168 lb 11.2 oz (76.5 kg)     Height 08/07/20 1005 5\' 10"  (1.778 m)     Head Circumference --      Peak Flow --      Pain Score 08/07/20 1005 8     Pain Loc --      Pain Edu? --       Excl. in [place]? --     Constitutional: Alert and oriented. Eyes: Conjunctivae are normal. Head: Atraumatic. Nose: No congestion/rhinnorhea. Mouth/Throat: Mucous membranes are moist. Neck: Normal ROM, no midline cervical spine tenderness. Cardiovascular: Tachycardic, regular rhythm. Grossly normal heart sounds.  2+ radial pulses bilaterally. Respiratory: Normal respiratory effort.  No retractions. Lungs CTAB.  Left anterior chest wall tender to palpation. Gastrointestinal: Soft and nontender. No distention. Genitourinary: deferred Musculoskeletal: No lower extremity tenderness nor edema.  No midline thoracic or lumbar spinal tenderness.  Tenderness to palpation just medial to left scapula as well as diffusely to left shoulder.  Range of motion to left shoulder intact. Neurologic:  Normal speech and language. No gross focal neurologic deficits are appreciated. Skin:  Skin is warm, dry and intact. No rash noted. Psychiatric: Mood and affect are normal. Speech and behavior are normal.  ____________________________________________   LABS (all labs ordered are listed, but only abnormal results are displayed)  Labs Reviewed  BRAIN NATRIURETIC PEPTIDE  TROPONIN I (HIGH SENSITIVITY)   ____________________________________________  EKG  ED ECG REPORT I, Blake Divine, the attending physician, personally viewed and interpreted this ECG.   Date: 08/07/2020  EKG Time: 12:37  Rate: 89  Rhythm: normal sinus rhythm  Axis: Normal  Intervals:none  ST&T Change: None   PROCEDURES  Procedure(s) performed (including Critical Care):  Procedures   ____________________________________________   INITIAL IMPRESSION / ASSESSMENT AND PLAN / ED COURSE       35 year old male with past medical history of Hodgkin lymphoma currently undergoing chemotherapy who presents to the ED complaining of 1 week of gradually worsening left-sided back pain, left chest pain, and left shoulder pain.  Pain  is worse with a deep breath and given he is currently undergoing chemotherapy we will further assess for PE with CTA chest.  Cervical spine stenosis or radiculopathy seems less likely given pain is reproducible with palpation of his back and chest.  He is neurovascularly intact to his left upper extremity.  Vital signs are reassuring and not consistent with sepsis, no signs of infectious process.  We will also check x-ray of left shoulder.  Labs from oncology this morning reviewed and are unremarkable, we will add on troponin and BNP as part of PE work-up.  We will treat patient's pain with IV morphine.  Patient reports pain improved following dose of morphine.  Left shoulder x-ray reviewed by me and shows no fracture, negative for acute process per radiology.  CTA is negative for PE but demonstrates recurrence of patient's lymphoma, likely contributing to his pain.  Troponin and BNP are negative, I doubt ACS.  Case discussed with Dr. Tasia Catchings of oncology, who will arrange for outpatient PET scan  and follow-up with patient in the clinic.  Will prescribe short course of pain medication and patient was counseled to return to the ED for new worsening symptoms, patient agrees with plan.      ____________________________________________   FINAL CLINICAL IMPRESSION(S) / ED DIAGNOSES  Final diagnoses:  Hodgkin lymphoma of intrathoracic lymph nodes, unspecified Hodgkin lymphoma type ([place])  Paresthesia  Chest pain, unspecified type     ED Discharge Orders         Ordered    oxyCODONE-acetaminophen (PERCOCET) 5-325 MG tablet  Every 4 hours PRN        08/07/20 1557           Note:  This document was prepared using Dragon voice recognition software and may include unintentional dictation errors.   Blake Divine, MD 08/07/20 [phone]

## 2020-08-07 NOTE — Progress Notes (Signed)
Hematology/Oncology Consult note [facility] [phone] [phone]   Patient Care Team: Patient, No Pcp Per as PCP - General (General Practice)  REFERRING PROVIDER: No ref. provider found  CHIEF COMPLAINTS/REASON FOR VISIT:  Evaluation of hodgkin's lymphoma.   HISTORY OF PRESENTING ILLNESS:   Preston [name] is a  34 y.o.  male with PMH listed below was seen in consultation at the request of  No ref. provider found  for evaluation of Hodgkin's lymphoma.  Patient has a known diagnosis of Hodgkin's lymphoma and wants to transfer his oncology care to our cancer center.  Extensive medical records reviewed was performed  Previous oncology care was [place] Per note  10/28/2019 biopsy of right chest wall mass, fibromuscular soft tissue with atypical infiltrate consistent with Hodgkin lymphoma, classical type. There were scattered single atypical cells with morphology suggestive of Jaclynn Guarneri variant cells. These cells were reactive with CD 45, CD 15, CD 30 and nonreactive for pan keratin, cam 5.7, CD3, CD5, CD10, CD 20, melan-A, Sox 10. Ki-67 stained majority of Reed-Sternberg like cells.  Noticed right upper chest wall lump in January 2021. He was incarcerated for 27 months, released in May 2021.  Unintentional weight loss about 30 pounds, drenching night sweats.  01/17/2020 PET scan showed bulky anterior mediastinal mass 12.7cm. with extensive adenopathy in neck, right axilla, mediastinum, bilateral hilar regions, upper abdominal ligament adenopathy and retroperitoneal adenopathy. Abnormal hypermetabolic activity within a splenic focus. Mild patchy ground glass opacity in the right lower lobe may reflect postobstructive inflammatory change without hypermetabolic activity.   01/17/2020 Echo 1. Normal biventricular systolic function and size. LVEF 60%. Averageglobal longitudinal strain is normal, -20%  2. No significant  valvular dysfunction.  3. Normal right-and left-sided filling pressures. 4. No prior study for comparison.  12/30/2019 LDH 588 01/24/2020 CRP 67.2, ESR 60  Stage IIIB Hodgkin's lymphoma, he was recommended ABVD x 2 cycles, followed by PET with plan of AVD x 4 or escalate to BEACOPP if not favorable results.   01/26/2020 ABVD Day 1,15 x 2 cycles.   03/24/2020 PET scan showed significant interval response to therapy. All PET positive lesions previously resolved 03/30/2020 AVD D1 04/13/2020 AVD D15  He was not able to have chest medi port placed due to chest mass. He has central line placed on left upper extremity. #Central line access Patient has seen Dr. Lucky Cowboy for evaluation of the left upper extremity central line.  I discussed with Dr. Lucky Cowboy and tip is in good position in the SVC.  Okay to use from vascular surgeon's aspect.  [facility] pathology consultation on biopsy specimen from 10/28/2019 showed findings compatible with classic Hodgkin's lymphoma.  INTERVAL HISTORY Preston [name] is a 34 y.o. male who has above history reviewed by me today presents for follow up visit for management of Hodgkin's  Lymphoma Patient reports not feeling well today.  He no-show to his last chemotherapy appointment.  Patient was accompanied by his girlfriend. He reports having upper respiratory infection symptoms, including cough, muscle aches and pains.  He did not seek medical advice.  Today cough has improved.  However he continues to have upper back and chest pain radiated to the left shoulder, as well as some left upper extremity numbness and weakness.  No fever or chills.  He did not get 2D echocardiogram done due to " not feeling well".   Review of Systems  Constitutional: Negative for appetite change, chills, fatigue, fever and unexpected weight change.  HENT:   Negative for hearing loss and voice change.   Eyes: Negative for eye problems and icterus.  Respiratory: Negative for chest tightness, cough and  shortness of breath.   Cardiovascular: Negative for chest pain and leg swelling.  Gastrointestinal: Negative for abdominal distention and abdominal pain.  Endocrine: Negative for hot flashes.  Genitourinary: Negative for difficulty urinating, dysuria and frequency.   Musculoskeletal: Positive for back pain. Negative for arthralgias and neck pain.       Left shoulder pain  Skin: Negative for itching and rash.  Neurological: Negative for light-headedness and numbness.  Hematological: Negative for adenopathy. Does not bruise/bleed easily.  Psychiatric/Behavioral: Negative for confusion.    MEDICAL HISTORY:  Past Medical History:  Diagnosis Date  . Hodgkin's lymphoma ([place])   . Tobacco use     SURGICAL HISTORY: History reviewed. No pertinent surgical history.  SOCIAL HISTORY: Social History   Socioeconomic History  . Marital status: Single    Spouse name: Not on file  . Number of children: Not on file  . Years of education: Not on file  . Highest education level: Not on file  Occupational History  . Not on file  Tobacco Use  . Smoking status: Current Every Day Smoker    Packs/day: 0.50    Years: 20.00    Pack years: 10.00  . Smokeless tobacco: Never Used  Vaping Use  . Vaping Use: Never used  Substance and Sexual Activity  . Alcohol use: Yes  . Drug use: Not Currently  . Sexual activity: Not on file  Other Topics Concern  . Not on file  Social History Narrative  . Not on file   Social Determinants of Health   Financial Resource Strain: Not on file  Food Insecurity: Not on file  Transportation Needs: Not on file  Physical Activity: Not on file  Stress: Not on file  Social Connections: Not on file  Intimate Partner Violence: Not on file    FAMILY HISTORY: Family History  Problem Relation Age of Onset  . Heart failure Mother   . Pneumonia Father     ALLERGIES:  has No Known Allergies.  MEDICATIONS:  No current facility-administered medications for this  visit.   Current Outpatient Medications  Medication Sig Dispense Refill  . lidocaine-prilocaine (EMLA) cream Apply 1 application topically as needed. 30 g 1  . ondansetron (ZOFRAN) 8 MG tablet Take 1 tablet (8 mg total) by mouth 2 (two) times daily. 60 tablet 1  . prochlorperazine (COMPAZINE) 10 MG tablet Take 1 tablet (10 mg total) by mouth every 6 (six) hours as needed for nausea or vomiting. 60 tablet 1  . traMADol (ULTRAM) 50 MG tablet Take 100 mg by mouth 2 (two) times daily as needed. (Patient not taking: Reported on 08/07/2020)     Facility-Administered Medications Ordered in Other Visits  Medication Dose Route Frequency Provider Last Rate Last Admin  . heparin lock flush 100 unit/mL  500 Units Intravenous Once Earlie Server, MD      . iohexol (OMNIPAQUE) 300 MG/ML solution 75 mL  75 mL Intravenous Once PRN Blake Divine, MD      . sodium chloride flush (NS) 0.9 % injection 10 mL  10 mL Intravenous PRN Earlie Server, MD   10 mL at 08/07/20 0826     PHYSICAL EXAMINATION: ECOG PERFORMANCE STATUS: 1 - Symptomatic but completely ambulatory Vitals:   08/07/20 0843  BP: (!) 122/92  Pulse: 93  Resp: 18  Temp: 98.7 F (37.1  C)   Filed Weights   08/07/20 0843  Weight: 168 lb 11.2 oz (76.5 kg)    Physical Exam Constitutional:      General: He is not in acute distress. HENT:     Head: Normocephalic and atraumatic.  Eyes:     General: No scleral icterus. Cardiovascular:     Rate and Rhythm: Normal rate and regular rhythm.     Heart sounds: Normal heart sounds.  Pulmonary:     Effort: Pulmonary effort is normal. No respiratory distress.     Breath sounds: No wheezing.  Abdominal:     General: Bowel sounds are normal. There is no distension.     Palpations: Abdomen is soft.  Musculoskeletal:        General: No deformity. Normal range of motion.     Cervical back: Normal range of motion and neck supple.  Skin:    General: Skin is warm and dry.     Findings: No erythema or rash.   Neurological:     Mental Status: He is alert and oriented to person, place, and time. Mental status is at baseline.     Cranial Nerves: No cranial nerve deficit.     Coordination: Coordination normal.  Psychiatric:        Mood and Affect: Mood normal.   left upper arm central line access     LABORATORY DATA:  I have reviewed the data as listed Lab Results  Component Value Date   WBC 8.7 08/07/2020   HGB 12.9 (L) 08/07/2020   HCT 35.3 (L) 08/07/2020   MCV 86.1 08/07/2020   PLT 413 (H) 08/07/2020   Recent Labs    06/26/20 0919 07/10/20 0838 08/07/20 0820  NA 138 137 136  K 3.8 3.7 4.3  CL 104 101 100  CO2 '23 25 26  ' GLUCOSE 94 98 112*  BUN '6 9 10  ' CREATININE 0.81 0.68 0.75  CALCIUM 8.9 8.8* 9.5  GFRNONAA >60 >60 >60  PROT 8.1 7.8 7.7  ALBUMIN 4.5 4.1 3.9  AST 29 37 26  ALT 16 33 22  ALKPHOS 52 55 50  BILITOT 0.7 0.7 1.0   Iron/TIBC/Ferritin/ %Sat No results found for: IRON, TIBC, FERRITIN, IRONPCTSAT    RADIOGRAPHIC STUDIES: I have personally reviewed the radiological images as listed and agreed with the findings in the report. CT Angio Chest PE W/Cm &/Or Wo Cm  Result Date: 08/07/2020 CLINICAL DATA:  Chest and shoulder pain and swelling. Lymphoma. Question pulmonary emboli. EXAM: CT ANGIOGRAPHY CHEST WITH CONTRAST TECHNIQUE: Multidetector CT imaging of the chest was performed using the standard protocol during bolus administration of intravenous contrast. Multiplanar CT image reconstructions and MIPs were obtained to evaluate the vascular anatomy. CONTRAST:  <See Chart> OMNIPAQUE IOHEXOL 300 MG/ML SOLN, 56m OMNIPAQUE IOHEXOL 350 MG/ML SOLN COMPARISON:  06/19/2020 FINDINGS: Cardiovascular: Heart size is normal. No pericardial fluid. No definite coronary artery calcification. No aortic atherosclerotic calcification. No dissection. Pulmonary arterial opacification is good. There are no pulmonary emboli. Mediastinum/Nodes: Superior mediastinal lymphadenopathy.  Anterior mediastinal mass projecting more to the right of center measures 8.3 cm cephalo caudal, 8.3 cm right to left and 3.8 cm front to back, consistent with the clinical history of lymphoma. Several other smaller but pathologic lymph nodes are noted in the anterior mediastinum, second largest at the aortopulmonary window measuring 1.3 x 1.9 x 3.1 cm. No enlarged axillary nodes on either side. No enlarged supraclavicular nodes on either side. Lungs/Pleura: No pleural effusion. No pulmonary mass or  nodule. No infiltrate. Mild compressive atelectasis of the medial anterior right lung because of the mediastinal mass per Upper Abdomen: Negative. No adenopathy or upper abdominal organ pathology. Musculoskeletal: Mild thoracic scoliotic curvature. No focal bone lesion. Review of the MIP images confirms the above findings. IMPRESSION: 1. No pulmonary emboli or other acute chest vascular pathology. 2. Superior mediastinal lymphadenopathy consistent with the clinical history of lymphoma. Largest node measures 8.3 x 8.3 x 3.8 cm. Several other smaller but pathologic lymph nodes in the anterior mediastinum, second largest at the aortopulmonary window measuring 1.3 x 1.9 x 3.1 cm. 3. No pulmonary mass or nodule. Mild compressive atelectasis of the medial anterior right lung because of the mediastinal mass. Electronically Signed   By: Nelson Chimes M.D.   On: 08/07/2020 14:01   DG Shoulder Left  Result Date: 08/07/2020 CLINICAL DATA:  Tenderness and swelling of the left shoulder. EXAM: LEFT SHOULDER - 2+ VIEW COMPARISON:  PET-CT from 03/24/2020 FINDINGS: Left upper extremity port noted. No obvious discontinuity in the tubing. No fracture or acute bony findings are observed. No significant arthropathy. IMPRESSION: 1. No significant abnormality identified. A left upper extremity port is noted with tubing extending from the distal humeral level of the port along the left upper extremity to the SVC. If there is clinical  concern for possible thrombus, consider left upper extremity ultrasound. Electronically Signed   By: Van Clines M.D.   On: 08/07/2020 12:47      ASSESSMENT & PLAN:  1. 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 Encounter for antineoplastic chemotherapy   3. Other chest pain    # Hodgkin's lymphoma, classic type Per previous Oncologist note, S/p 2 cycles of ABVD with favorable PET outcome ([place] 3), and then 1 cycle of AVD.  Patient switched care after relocating to [facility]. Received cycle 4-day 1 AVD on 06/26/2020, day 15 AVD on 07/10/2020. Cycle 5 was delayed due to patient no-show to his appointments.  Today he has developed new upper back/chest pain/left shoulder pain, as well as left upper extremity weakness and numbness. I discussed with patient that I will hold off treatment.  I will send patient to emergency room for further evaluation.   He needs image studies to rule out pneumonia, PE, recurrence of disease.  Echocardiogram was done in June 2021.  Outpatient 2D echocardiogram was scheduled and the patient did not have it done  All questions were answered. The patient knows to call the clinic with any problems questions or concerns.  Return of visit: To be determined  Earlie Server, MD, PhD Hematology Oncology [facility] at [facility] Pager- [phone] 08/07/2020

## 2020-08-07 NOTE — ED Notes (Signed)
Labs are in results from today at the cancer center

## 2020-08-08 ENCOUNTER — Telehealth: Payer: Self-pay | Admitting: *Deleted

## 2020-08-08 NOTE — Telephone Encounter (Signed)
Called pt and made him aware of his sched PET scan that's sched for 08/23/20 @ 10:30 He was made aware of the location, date, must arrive 30 minutes prior to the scheduled appointment time and not eat or drink 6 hours prior to exam--except for water.

## 2020-08-22 ENCOUNTER — Ambulatory Visit: Admission: RE | Admit: 2020-08-22 | Payer: Self-pay | Source: Ambulatory Visit

## 2020-08-23 ENCOUNTER — Other Ambulatory Visit: Payer: Self-pay

## 2020-08-23 ENCOUNTER — Ambulatory Visit
Admission: RE | Admit: 2020-08-23 | Discharge: 2020-08-23 | Disposition: A | Discharge status: Home / Self Care | Payer: Self-pay | Source: Ambulatory Visit | Attending: Oncology | Admitting: Oncology

## 2020-08-23 DIAGNOSIS — R590 Localized enlarged lymph nodes: Secondary | ICD-10-CM | POA: Insufficient documentation

## 2020-08-23 DIAGNOSIS — J9859 Other diseases of mediastinum, not elsewhere classified: Secondary | ICD-10-CM | POA: Insufficient documentation

## 2020-08-23 LAB — GLUCOSE, CAPILLARY: Glucose-Capillary: 108 mg/dL — ABNORMAL HIGH (ref 70–99)

## 2020-08-23 IMAGING — PT NM PET TUM IMG INITIAL (PI) SKULL BASE T - THIGH
1 of 10 series · 1 of 25 positions shown · non-contrast
Comparison: Outside PET-CT from [REDACTED] dated
[DATE].

CLINICAL DATA: Initial treatment strategy for lymphadenopathy.
History of Hodgkin's lymphoma. Mediastinal lymphadenopathy on CT
chest [DATE].

EXAM:
NUCLEAR MEDICINE PET SKULL BASE TO THIGH
TECHNIQUE: 9.1 mCi F-18 FDG was injected intravenously. Full-ring PET imaging
was performed from the skull base to thigh after the radiotracer. CT
data was obtained and used for attenuation correction and anatomic
localization.
Fasting blood glucose: 108 mg/dl

[Series 3: ct wb 5.0 b30f · axial · 5.0mm · 0.98mm/px · 1 of 329 slices shown]
[im 329/329  brain]
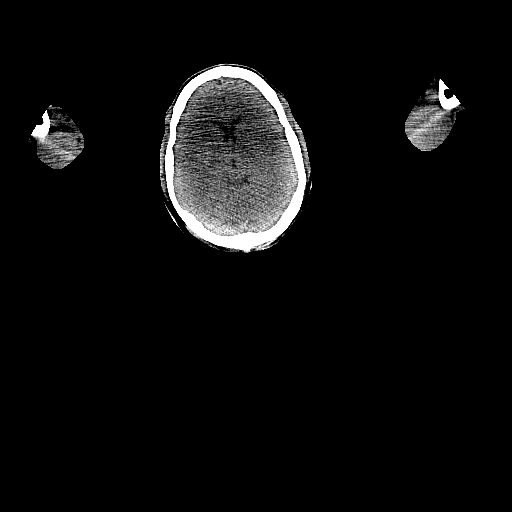

[1 of 25 positions shown; findings below may reference images not displayed]

This is of limited benefit as images cannot be loaded
into interpretation software.
FINDINGS: Mediastinal blood pool activity: SUV max

Liver activity: SUV max

NECK: Focal hypermetabolism identified in a tiny right-sided level
III lymph node with SUV max = 2.0 (image 40/series 3). No
lymphadenopathy by CT imaging.

Incidental CT findings: none

CHEST: Bulky anterior mediastinal nodal conglomeration measuring
x 4.6 cm on image 93/3 was 6.5 x 4.6 cm previously (remeasured). SUV
max = 2.4 today. SUV measurements unavailable on prior study but
qualitatively similar and reported at 2.0 in records on [REDACTED] [REDACTED]. No marked hilar or axillary hypermetabolism. No gross
hilar lymphadenopathy on noncontrast CT imaging. No hilar
lymphadenopathy.

Incidental CT findings: Left PICC line tip projects in the lower
right atrium.

ABDOMEN/PELVIS: No abnormal hypermetabolic activity within the
liver, pancreas, adrenal glands, or spleen. 1.4 cm short axis
hepatoduodenal ligament lymph node on 147/3 is mildly enlarged, but
stable since prior outside study at 1.6 cm (remeasured). SUV max =
2.4 on today's PET imaging.

No other hypermetabolic lymph nodes in the abdomen or pelvis.

Incidental CT findings: No splenomegaly. There is trace abdominal
aortic atherosclerosis without aneurysm.

SKELETON: Low level uptake identified lateral right sixth rib
associated with apparent nonacute nondisplaced fracture (CT image
104/3

Incidental CT findings: none
IMPRESSION: 1. Dominant right paramidline anterior mediastinal mass lesion is
stable in size comparing to outside study of [DATE]. This lesion
shows low level FDG uptake ([HOSPITAL] 3), also not appreciably
changed in the interval.
2. Mildly enlarged hepatoduodenal ligament lymph node measures
minimally smaller on CT imaging today with [HOSPITAL] 2 uptake.
3. No additional areas of unexpected or suspicious hypermetabolism.
No other findings of lymphadenopathy on noncontrast CT imaging
today.

## 2020-08-23 MED ORDER — FLUDEOXYGLUCOSE F - 18 (FDG) INJECTION
7.1000 | Freq: Once | INTRAVENOUS | Status: AC | PRN
  Administered 2020-08-23: 9.045 via INTRAVENOUS

## 2020-08-25 ENCOUNTER — Telehealth: Payer: Self-pay

## 2020-08-25 NOTE — Telephone Encounter (Signed)
Please schedule patient as requested and notify patient of appt details. Thanks

## 2020-08-25 NOTE — Telephone Encounter (Signed)
-----   Message from Earlie Server, MD sent at 08/25/2020  9:04 AM EST ----- Please schedule lab md AVD.thanks.

## 2020-08-25 NOTE — Telephone Encounter (Signed)
Done   Pts friend "Danae Chen" was made aware of pts 09/01/20 appts for  Lab/MD/VD

## 2020-09-01 ENCOUNTER — Inpatient Hospital Stay: Payer: Self-pay | Attending: Oncology

## 2020-09-01 ENCOUNTER — Inpatient Hospital Stay (HOSPITAL_BASED_OUTPATIENT_CLINIC_OR_DEPARTMENT_OTHER): Payer: Self-pay | Admitting: Oncology

## 2020-09-01 ENCOUNTER — Inpatient Hospital Stay: Payer: Self-pay

## 2020-09-01 ENCOUNTER — Encounter: Payer: Self-pay | Admitting: Oncology

## 2020-09-01 ENCOUNTER — Other Ambulatory Visit: Payer: Self-pay

## 2020-09-01 VITALS — BP 136/97 | HR 85 | Temp 96.6°F | Resp 16 | Wt 175.5 lb

## 2020-09-01 DIAGNOSIS — R0600 Dyspnea, unspecified: Secondary | ICD-10-CM

## 2020-09-01 DIAGNOSIS — C8192 Hodgkin lymphoma, unspecified, intrathoracic lymph nodes: Secondary | ICD-10-CM | POA: Insufficient documentation

## 2020-09-01 DIAGNOSIS — M25512 Pain in left shoulder: Secondary | ICD-10-CM | POA: Insufficient documentation

## 2020-09-01 DIAGNOSIS — C8172 Other classical Hodgkin lymphoma, intrathoracic lymph nodes: Secondary | ICD-10-CM

## 2020-09-01 DIAGNOSIS — R0789 Other chest pain: Secondary | ICD-10-CM

## 2020-09-01 DIAGNOSIS — Z5111 Encounter for antineoplastic chemotherapy: Secondary | ICD-10-CM | POA: Insufficient documentation

## 2020-09-01 LAB — COMPREHENSIVE METABOLIC PANEL
ALT: 20 U/L (ref 0–44)
AST: 23 U/L (ref 15–41)
Albumin: 4.2 g/dL (ref 3.5–5.0)
Alkaline Phosphatase: 44 U/L (ref 38–126)
Anion gap: 6 (ref 5–15)
BUN: 9 mg/dL (ref 6–20)
CO2: 27 mmol/L (ref 22–32)
Calcium: 8.9 mg/dL (ref 8.9–10.3)
Chloride: 103 mmol/L (ref 98–111)
Creatinine, Ser: 0.69 mg/dL (ref 0.61–1.24)
GFR, Estimated: >60 mL/min (ref >60)
Glucose, Bld: 99 mg/dL (ref 70–99)
Potassium: 4 mmol/L (ref 3.5–5.1)
Sodium: 136 mmol/L (ref 135–145)
Total Bilirubin: 0.6 mg/dL (ref 0.3–1.2)
Total Protein: 7.2 g/dL (ref 6.5–8.1)

## 2020-09-01 LAB — CBC WITH DIFFERENTIAL/PLATELET
Abs Immature Granulocytes: 0.02 10*3/uL (ref 0.00–0.07)
Basophils Absolute: 0 10*3/uL (ref 0.0–0.1)
Basophils Relative: 1 %
Eosinophils Absolute: 0.1 10*3/uL (ref 0.0–0.5)
Eosinophils Relative: 3 %
HCT: 34.9 % — ABNORMAL LOW (ref 39.0–52.0)
Hemoglobin: 13.1 g/dL (ref 13.0–17.0)
Immature Granulocytes: 0 %
Lymphocytes Relative: 27 %
Lymphs Abs: 1.4 10*3/uL (ref 0.7–4.0)
MCH: 32.8 pg (ref 26.0–34.0)
MCHC: 37.5 g/dL — ABNORMAL HIGH (ref 30.0–36.0)
MCV: 87.5 fL (ref 80.0–100.0)
Monocytes Absolute: 0.5 10*3/uL (ref 0.1–1.0)
Monocytes Relative: 11 %
Neutro Abs: 3 10*3/uL (ref 1.7–7.7)
Neutrophils Relative %: 58 %
Platelets: 226 10*3/uL (ref 150–400)
RBC: 3.99 MIL/uL — ABNORMAL LOW (ref 4.22–5.81)
RDW: 14.1 % (ref 11.5–15.5)
WBC: 5.1 10*3/uL (ref 4.0–10.5)
nRBC: 0 % (ref 0.0–0.2)

## 2020-09-01 LAB — C-REACTIVE PROTEIN: CRP: 0.6 mg/dL (ref <1.0)

## 2020-09-01 MED ORDER — SODIUM CHLORIDE 0.9 % IV SOLN
10.0000 mg | Freq: Once | INTRAVENOUS | Status: DC
  Filled 2020-09-01: qty 1

## 2020-09-01 MED ORDER — KETOROLAC TROMETHAMINE 15 MG/ML IJ SOLN
30.0000 mg | Freq: Once | INTRAMUSCULAR | Status: AC
  Administered 2020-09-01: 30 mg via INTRAVENOUS

## 2020-09-01 MED ORDER — VINBLASTINE SULFATE CHEMO INJECTION 1 MG/ML: 6.0000 mg/m2 | Freq: Once | INTRAVENOUS | Status: DC

## 2020-09-01 MED ORDER — SODIUM CHLORIDE 0.9 % IV SOLN
150.0000 mg | Freq: Once | INTRAVENOUS | Status: DC
  Filled 2020-09-01: qty 5

## 2020-09-01 MED ORDER — KETOROLAC TROMETHAMINE 15 MG/ML IJ SOLN
INTRAMUSCULAR | Status: AC
  Filled 2020-09-01: qty 2

## 2020-09-01 MED ORDER — SODIUM CHLORIDE 0.9 % IV SOLN
Freq: Once | INTRAVENOUS | Status: AC
  Filled 2020-09-01: qty 250

## 2020-09-01 MED ORDER — KETOROLAC TROMETHAMINE 30 MG/ML IJ SOLN: 30.0000 mg | Freq: Once | INTRAMUSCULAR | Status: DC

## 2020-09-01 MED ORDER — DOXORUBICIN HCL CHEMO IV INJECTION 2 MG/ML: 25.0000 mg/m2 | Freq: Once | INTRAVENOUS | Status: DC

## 2020-09-01 MED ORDER — SODIUM CHLORIDE 0.9 % IV SOLN: 375.0000 mg/m2 | Freq: Once | INTRAVENOUS | Status: DC

## 2020-09-01 MED ORDER — SODIUM CHLORIDE 0.9% FLUSH
10.0000 mL | INTRAVENOUS | Status: DC | PRN
  Filled 2020-09-01: qty 10

## 2020-09-01 MED ORDER — HEPARIN SOD (PORK) LOCK FLUSH 100 UNIT/ML IV SOLN
500.0000 [IU] | Freq: Once | INTRAVENOUS | Status: AC | PRN
  Administered 2020-09-01: 500 [IU]
  Filled 2020-09-01: qty 5

## 2020-09-01 MED ORDER — HEPARIN SOD (PORK) LOCK FLUSH 100 UNIT/ML IV SOLN
250.0000 [IU] | Freq: Once | INTRAVENOUS | Status: DC | PRN
  Filled 2020-09-01: qty 5

## 2020-09-01 MED ORDER — PALONOSETRON HCL INJECTION 0.25 MG/5ML: 0.2500 mg | Freq: Once | INTRAVENOUS | Status: DC

## 2020-09-01 NOTE — Progress Notes (Signed)
Patient was seen in clinic by Dr. Tasia Catchings and sent over to infusion suite for his treatment. Patient was noted to have a dry cough. When asked about the cough, he stated that he has had a dry, non-productive cough and shortness of breath with exertion for about 2 weeks. Patient denied fever, sore throat, headache, body aches, fatigue, and lost of smell/taste. Patient states that "it hurts like hell when I cough". Patient also complained of back and chest wall pain. Message was sent to Dr. Tasia Catchings via secure chat.  Per pharmacy, patient has not had an Echo since July 2021. Per RN, patient has either no showed or canceled his last 3 appointments for Echo.  Per Dr. Tasia Catchings, no treatment today. Patient did not have his Echo as ordered prior to chemotherapy. Echo scheduled for next week. Chemotherapy rescheduled after Echo. Patient was given 30 mg Toradol IV for pain while in clinic. Appointments made and printed, and given to the patient. Patient was also instucted to go get a chest x ray prior to going home today.  Patient verbalized understanding of the instructions given to him. Port was de accessed. Patient discharged in stable condition.

## 2020-09-01 NOTE — Progress Notes (Signed)
Hematology/Oncology follow up note Preston [name] Telephone:([phone] [phone]   Patient Care Team: Patient, No Pcp Per as PCP - General (General Practice)  REFERRING PROVIDER: Earlie Server, MD  CHIEF COMPLAINTS/REASON FOR VISIT:  Follow up for hodgkin's lymphoma.   HISTORY OF PRESENTING ILLNESS:   Preston [name] is a  35 y.o.  male with PMH listed below was seen in consultation at the request of  Preston Server, MD  for evaluation of Hodgkin's lymphoma.  Patient has a known diagnosis of Hodgkin's lymphoma and wants to transfer his oncology care to our cancer [name].  Extensive medical records reviewed was performed  Previous oncology care was [facility] Per note  10/28/2019 biopsy of right chest wall mass, fibromuscular soft tissue with atypical infiltrate consistent with Hodgkin lymphoma, classical type. There were scattered single atypical cells with morphology suggestive of Preston [name] variant cells. These cells were reactive with CD 45, CD 15, CD 30 and nonreactive for pan keratin, cam 5.7, CD3, CD5, CD10, CD 20, melan-A, Sox 10. Ki-67 stained majority of Preston [name] like cells.  Noticed right upper chest wall lump in January 2021. He was incarcerated for 27 months, released in May 2021.  Unintentional weight loss about 30 pounds, drenching night sweats.  01/17/2020 PET scan showed bulky anterior mediastinal mass 12.7cm. with extensive adenopathy in neck, right axilla, mediastinum, bilateral hilar regions, upper abdominal ligament adenopathy and retroperitoneal adenopathy. Abnormal hypermetabolic activity within a splenic focus. Mild patchy ground glass opacity in the right lower lobe may reflect postobstructive inflammatory change without hypermetabolic activity.   01/17/2020 Echo 1. Normal biventricular systolic function and size. LVEF 60%. Averageglobal longitudinal strain is normal, -20%  2. No significant valvular  dysfunction.  3. Normal right-and left-sided filling pressures. 4. No prior study for comparison.  12/30/2019 LDH 588 01/24/2020 CRP 67.2, ESR 60  Stage IIIB Hodgkin's lymphoma, he was recommended ABVD x 2 cycles, followed by PET with plan of AVD x 4 or escalate to BEACOPP if not favorable results.   01/26/2020 ABVD Day 1,15 x 2 cycles.   03/24/2020 PET scan showed significant interval response to therapy. All PET positive lesions previously resolved 03/30/2020 AVD D1 04/13/2020 AVD D15  He was not able to have chest medi port placed due to chest mass. He has central line placed on left upper extremity. #Central line access Patient has seen Dr. Lucky [name] for evaluation of the left upper extremity central line.  I discussed with Dr. Lucky [name] and tip is in good position in the SVC.  Okay to use from vascular surgeon's aspect.  [facility] pathology consultation on biopsy specimen from 10/28/2019 showed findings compatible with classic Hodgkin's lymphoma.  INTERVAL HISTORY Preston [name] is a 35 y.o. male who has above history reviewed by me today presents for follow up visit for management of Hodgkin's  Lymphoma Last AVD chemotherapy was given on 07/10/2020.  He no showed for his chemotherapy appointment in mid December 2021.  He was seen by me on 08/07/2020 at that time he reports acute onset of chest pain, left shoulder, back and left arm. He was sent to emergency room to have a CT scan done to rule out acute pulmonary embolism 08/07/2020, CT chest angiogram showed no pulmonary emboli or acute chest vascular pathology. Superior mediastinal lymphadenopathy consistent with clinical history of lymphoma.  Largest node measures 8.3 x 8.3 x 3.8 cm.  Several other smaller but pathologic lymph nodes in the anterior mediastinum, second largest at the aortopulmonary  window measuring 1.3 x 1.9 x 3.1 cm.  No pulmonary mass or nodule.  Patient also had negative troponin, Patient was discharged home for further follow-up  with cancer [name].  ER physician provided a prescription of Percocet 5/325 mg 1 tablet every 4 hours as needed for severe pain.  I obtained additional work-up for work-up to see if there is lymphoma progression.  08/23/2020 PET scan showed dominant right paramidline anterior mediastinal mass lesion is stable in size comparing to her outside PET scan on 03/24/2020.  Lesion showed low-level FDG uptake Douville 3, not appreciably changed in the interval.  Mildly enlarged hepatoduodenal ligament lymph node measures minimally smaller on CT imaging today with Douville 2 uptake.  No additional area of unexpected or suspicious hypermetabolic zone.  No other findings of lymphadenopathy on noncontrast CT image today.  Patient has been previously scheduled to have 2D echocardiogram done and he has had 2D echo done yet  Today he was accompanied by his girlfriend to the clinic for discussion of PET scan, and evaluation prior to proceeding with AVD chemotherapy today.  He reports that he continues to have severe left chest pain, back pain, arm pain, shoulder pain for which he takes ibuprofen and Tylenol with no relief.  He also has ran out the Percocet that was given by emergency room. He has good appetite.  He has gained 7 pounds since his visit in 08/07/2020. He has had a cough, reports shortness of breath with exertion.  Review of Systems  Constitutional: Negative for appetite change, chills, fatigue, fever and unexpected weight change.  HENT:   Negative for hearing loss and voice change.   Eyes: Negative for eye problems and icterus.  Respiratory: Negative for chest tightness, cough and shortness of breath.   Cardiovascular: Negative for leg swelling.       Chest wall pain  Gastrointestinal: Negative for abdominal distention and abdominal pain.  Endocrine: Negative for hot flashes.  Genitourinary: Negative for difficulty urinating, dysuria and frequency.   Musculoskeletal: Positive for back pain.  Negative for arthralgias and neck pain.       Left shoulder pain  Skin: Negative for itching and rash.  Neurological: Negative for light-headedness and numbness.  Hematological: Negative for adenopathy. Does not bruise/bleed easily.  Psychiatric/Behavioral: Negative for confusion.    MEDICAL HISTORY:  Past Medical History:  Diagnosis Date  . Hodgkin's lymphoma ([place])   . Tobacco use     SURGICAL HISTORY: History reviewed. No pertinent surgical history.  SOCIAL HISTORY: Social History   Socioeconomic History  . Marital status: Single    Spouse name: Not on file  . Number of children: Not on file  . Years of education: Not on file  . Highest education level: Not on file  Occupational History  . Not on file  Tobacco Use  . Smoking status: Current Every Day Smoker    Packs/day: 0.50    Years: 20.00    Pack years: 10.00  . Smokeless tobacco: Never Used  Vaping Use  . Vaping Use: Never used  Substance and Sexual Activity  . Alcohol use: Yes  . Drug use: Not Currently  . Sexual activity: Not on file  Other Topics Concern  . Not on file  Social History Narrative  . Not on file   Social Determinants of Health   Financial Resource Strain: Not on file  Food Insecurity: Not on file  Transportation Needs: Not on file  Physical Activity: Not on file  Stress: Not on  file  Social Connections: Not on file  Intimate Partner Violence: Not on file    FAMILY HISTORY: Family History  Problem Relation Age of Onset  . Heart failure Mother   . Pneumonia Father     ALLERGIES:  has No Known Allergies.  MEDICATIONS:  Current Outpatient Medications  Medication Sig Dispense Refill  . lidocaine-prilocaine (EMLA) cream Apply 1 application topically as needed. 30 g 1  . ondansetron (ZOFRAN) 8 MG tablet Take 1 tablet (8 mg total) by mouth 2 (two) times daily. 60 tablet 1  . prochlorperazine (COMPAZINE) 10 MG tablet Take 1 tablet (10 mg total) by mouth every 6 (six) hours as needed  for nausea or vomiting. 60 tablet 1  . oxyCODONE-acetaminophen (PERCOCET) 5-325 MG tablet Take 1 tablet by mouth every 4 (four) hours as needed for severe pain. (Patient not taking: Reported on 09/01/2020) 20 tablet 0   No current facility-administered medications for this visit.   Facility-Administered Medications Ordered in Other Visits  Medication Dose Route Frequency Provider Last Rate Last Admin  . heparin lock flush 100 unit/mL  500 Units Intravenous Once Preston Server, MD      . heparin lock flush 100 unit/mL  250 Units Intracatheter Once PRN Preston Server, MD      . sodium chloride flush (NS) 0.9 % injection 10 mL  10 mL Intravenous PRN Preston Server, MD   10 mL at 08/07/20 0826  . sodium chloride flush (NS) 0.9 % injection 10 mL  10 mL Intracatheter PRN Preston Server, MD         PHYSICAL EXAMINATION: ECOG PERFORMANCE STATUS: 1 - Symptomatic but completely ambulatory Vitals:   09/01/20 0859  BP: (!) 136/97  Pulse: 85  Resp: 16  Temp: (!) 96.6 F (35.9 C)  SpO2: 100%   Filed Weights   09/01/20 0859  Weight: 175 lb 8 oz (79.6 kg)    Physical Exam Constitutional:      General: He is not in acute distress. HENT:     Head: Normocephalic and atraumatic.  Eyes:     General: No scleral icterus. Cardiovascular:     Rate and Rhythm: Normal rate and regular rhythm.     Heart sounds: Normal heart sounds.  Pulmonary:     Effort: Pulmonary effort is normal. No respiratory distress.     Breath sounds: No wheezing.  Abdominal:     General: Bowel sounds are normal. There is no distension.     Palpations: Abdomen is soft.  Musculoskeletal:        General: No deformity. Normal range of motion.     Cervical back: Normal range of motion and neck supple.  Skin:    General: Skin is warm and dry.     Findings: No erythema or rash.  Neurological:     Mental Status: He is alert and oriented to person, place, and time. Mental status is at baseline.     Cranial Nerves: No cranial nerve deficit.      Coordination: Coordination normal.  Psychiatric:        Mood and Affect: Mood normal.   left upper arm central line access     LABORATORY DATA:  I have reviewed the data as listed Lab Results  Component Value Date   WBC 5.1 09/01/2020   HGB 13.1 09/01/2020   HCT 34.9 (L) 09/01/2020   MCV 87.5 09/01/2020   PLT 226 09/01/2020   Recent Labs    07/10/20 0838 08/07/20 0820 09/01/20 4765  NA 137 136 136  K 3.7 4.3 4.0  CL 101 100 103  CO2 '25 26 27  ' GLUCOSE 98 112* 99  BUN '9 10 9  ' CREATININE 0.68 0.75 0.69  CALCIUM 8.8* 9.5 8.9  GFRNONAA >60 >60 >60  PROT 7.8 7.7 7.2  ALBUMIN 4.1 3.9 4.2  AST 37 26 23  ALT 33 22 20  ALKPHOS 55 50 44  BILITOT 0.7 1.0 0.6   Iron/TIBC/Ferritin/ %Sat No results found for: IRON, TIBC, FERRITIN, IRONPCTSAT    RADIOGRAPHIC STUDIES: I have personally reviewed the radiological images as listed and agreed with the findings in the report. DG Chest 2 View  Result Date: 08/07/2020 CLINICAL DATA:  Swollen area to RIGHT shoulder for the past week. Known lymphoma. EXAM: CHEST - 2 VIEW COMPARISON:  Chest x-ray dated 06/19/2020. Chest CT angiogram performed earlier same day. FINDINGS: Heart size and mediastinal contours are stable. LEFT-sided PICC line appears well positioned with tip at the level of the lower SVC. Lungs are clear. No pleural effusion or pneumothorax is seen. No acute appearing osseous abnormality. IMPRESSION: 1. No active cardiopulmonary disease. No evidence of pneumonia or pulmonary edema. 2. LEFT-sided PICC line appears well positioned with tip at the level of the lower SVC. 3. RIGHT-sided mediastinal mass, better demonstrated on today's earlier chest CT angiogram, compatible with mediastinal lymphadenopathy related to known history of lymphoma. Electronically Signed   By: Franki Cabot M.D.   On: 08/07/2020 15:20   CT Angio Chest PE W/Cm &/Or Wo Cm  Result Date: 08/07/2020 CLINICAL DATA:  Chest and shoulder pain and swelling.  Lymphoma. Question pulmonary emboli. EXAM: CT ANGIOGRAPHY CHEST WITH CONTRAST TECHNIQUE: Multidetector CT imaging of the chest was performed using the standard protocol during bolus administration of intravenous contrast. Multiplanar CT image reconstructions and MIPs were obtained to evaluate the vascular anatomy. CONTRAST:  <See Chart> OMNIPAQUE IOHEXOL 300 MG/ML SOLN, 39m OMNIPAQUE IOHEXOL 350 MG/ML SOLN COMPARISON:  06/19/2020 FINDINGS: Cardiovascular: Heart size is normal. No pericardial fluid. No definite coronary artery calcification. No aortic atherosclerotic calcification. No dissection. Pulmonary arterial opacification is good. There are no pulmonary emboli. Mediastinum/Nodes: Superior mediastinal lymphadenopathy. Anterior mediastinal mass projecting more to the right of [name] measures 8.3 cm cephalo caudal, 8.3 cm right to left and 3.8 cm front to back, consistent with the clinical history of lymphoma. Several other smaller but pathologic lymph nodes are noted in the anterior mediastinum, second largest at the aortopulmonary window measuring 1.3 x 1.9 x 3.1 cm. No enlarged axillary nodes on either side. No enlarged supraclavicular nodes on either side. Lungs/Pleura: No pleural effusion. No pulmonary mass or nodule. No infiltrate. Mild compressive atelectasis of the medial anterior right lung because of the mediastinal mass per Upper Abdomen: Negative. No adenopathy or upper abdominal organ pathology. Musculoskeletal: Mild thoracic scoliotic curvature. No focal bone lesion. Review of the MIP images confirms the above findings. IMPRESSION: 1. No pulmonary emboli or other acute chest vascular pathology. 2. Superior mediastinal lymphadenopathy consistent with the clinical history of lymphoma. Largest node measures 8.3 x 8.3 x 3.8 cm. Several other smaller but pathologic lymph nodes in the anterior mediastinum, second largest at the aortopulmonary window measuring 1.3 x 1.9 x 3.1 cm. 3. No pulmonary mass or  nodule. Mild compressive atelectasis of the medial anterior right lung because of the mediastinal mass. Electronically Signed   By: MNelson ChimesM.D.   On: 08/07/2020 14:01   NM PET Image Initial (PI) Skull Base To Thigh  Result Date:  08/23/2020 CLINICAL DATA:  Initial treatment strategy for lymphadenopathy. History of Hodgkin's lymphoma. Mediastinal lymphadenopathy on CT chest 08/07/2020. EXAM: NUCLEAR MEDICINE PET SKULL BASE TO THIGH TECHNIQUE: 9.1 mCi F-18 FDG was injected intravenously. Full-ring PET imaging was performed from the skull base to thigh after the radiotracer. CT data was obtained and used for attenuation correction and anatomic localization. Fasting blood glucose: 108 mg/dl COMPARISON:  Outside PET-CT from new [place] [name] dated 03/24/2020. This is of limited benefit as images cannot be loaded into interpretation software. FINDINGS: Mediastinal blood pool activity: SUV max 1.8 Liver activity: SUV max 3.0 NECK: Focal hypermetabolism identified in a tiny right-sided level III lymph node with SUV max = 2.0 (image 40/series 3). No lymphadenopathy by CT imaging. Incidental CT findings: none CHEST: Bulky anterior mediastinal nodal conglomeration measuring 6.7 x 4.6 cm on image 93/3 was 6.5 x 4.6 cm previously (remeasured). SUV max = 2.4 today. SUV measurements unavailable on prior study but qualitatively similar and reported at 2.0 in records on [place]. No marked hilar or axillary hypermetabolism. No gross hilar lymphadenopathy on noncontrast CT imaging. No hilar lymphadenopathy. Incidental CT findings: Left PICC line tip projects in the lower right atrium. ABDOMEN/PELVIS: No abnormal hypermetabolic activity within the liver, pancreas, adrenal glands, or spleen. 1.4 cm short axis hepatoduodenal ligament lymph node on 147/3 is mildly enlarged, but stable since prior outside study at 1.6 cm (remeasured). SUV max = 2.4 on today's PET imaging. No other hypermetabolic lymph nodes  in the abdomen or pelvis. Incidental CT findings: No splenomegaly. There is trace abdominal aortic atherosclerosis without aneurysm. SKELETON: Low level uptake identified lateral right sixth rib associated with apparent nonacute nondisplaced fracture (CT image 104/3 Incidental CT findings: none IMPRESSION: 1. Dominant right paramidline anterior mediastinal mass lesion is stable in size comparing to outside study of 03/24/2020. This lesion shows low level FDG uptake (Deauville 3), also not appreciably changed in the interval. 2. Mildly enlarged hepatoduodenal ligament lymph node measures minimally smaller on CT imaging today with Deauville 2 uptake. 3. No additional areas of unexpected or suspicious hypermetabolism. No other findings of lymphadenopathy on noncontrast CT imaging today. Electronically Signed   By: Misty Stanley M.D.   On: 08/23/2020 14:30   US Venous Img Upper Uni Left  Result Date: 08/07/2020 CLINICAL DATA:  Port in place.  Left-sided pain. EXAM: Left UPPER EXTREMITY VENOUS DOPPLER ULTRASOUND TECHNIQUE: Gray-scale sonography with graded compression, as well as color Doppler and duplex ultrasound were performed to evaluate the upper extremity deep venous system from the level of the subclavian vein and including the jugular, axillary, basilic, radial, ulnar and upper cephalic vein. Spectral Doppler was utilized to evaluate flow at rest and with distal augmentation maneuvers. COMPARISON:  None. FINDINGS: Contralateral Subclavian Vein: Respiratory phasicity is normal and symmetric with the symptomatic side. No evidence of thrombus. Normal compressibility. Internal Jugular Vein: No evidence of thrombus. Normal compressibility, respiratory phasicity and response to augmentation. Subclavian Vein: No evidence of thrombus. Normal compressibility, respiratory phasicity and response to augmentation. Axillary Vein: No evidence of thrombus. Normal compressibility, respiratory phasicity and response to  augmentation. Cephalic Vein: No evidence of thrombus. Normal compressibility, respiratory phasicity and response to augmentation. Basilic Vein: No evidence of thrombus. Normal compressibility, respiratory phasicity and response to augmentation. Venous catheter noted in place. Brachial Veins: No evidence of thrombus. Normal compressibility, respiratory phasicity and response to augmentation. Radial Veins: No evidence of thrombus. Normal compressibility, respiratory phasicity and response to augmentation. Ulnar Veins: No evidence  of thrombus. Normal compressibility, respiratory phasicity and response to augmentation. Venous Reflux:  None visualized. Other Findings:  None visualized. IMPRESSION: No evidence of DVT within the left upper extremity. Electronically Signed   By: Nelson Chimes M.D.   On: 08/07/2020 15:31   DG Shoulder Left  Result Date: 08/07/2020 CLINICAL DATA:  Tenderness and swelling of the left shoulder. EXAM: LEFT SHOULDER - 2+ VIEW COMPARISON:  PET-CT from 03/24/2020 FINDINGS: Left upper extremity port noted. No obvious discontinuity in the tubing. No fracture or acute bony findings are observed. No significant arthropathy. IMPRESSION: 1. No significant abnormality identified. A left upper extremity port is noted with tubing extending from the distal humeral level of the port along the left upper extremity to the SVC. If there is clinical concern for possible thrombus, consider left upper extremity ultrasound. Electronically Signed   By: Van Clines M.D.   On: 08/07/2020 12:47      ASSESSMENT & PLAN:  1. Encounter for antineoplastic chemotherapy   2. 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 Pain in joint of left shoulder   4. Dyspnea, unspecified type    # Hodgkin's lymphoma, classic type Per previous Oncologist note, S/p 2 cycles of ABVD with favorable PET outcome (deuville 3), and then 1 cycle of AVD.  Patient switched care after relocating to  [facility]. Received cycle 4-day 1 AVD on 06/26/2020, day 15 AVD on 07/10/2020. Cycle 5 was delayed due to patient no-show to his appointments. Labs reviewed and discussed with patient. PET scan images were independently reviewed by me and discussed with patient. Comparing to his PET scan done in outside facility in August 2021, there is no significant interval changes of the size and hypermetabolic activity,[Deuville 3].  I will not switch chemotherapy regimen at this point.  Recommend him to complete the suggested course of AVD.  #Cough and shortness of breath with exertion. I will obtain a chest x-ray.  Shortness of breath, unknown etiology. He has not had his 2D echo done yet so I will hold off today's chemotherapy, resume chemo after 2D echo is done.  #Left chest/back/shoulder/arm pain Unknown etiology. This pain is acute onset since end of December 2021.  He did not have pain when he establish care with me in October 2021, and the PET scan appears to be similar comparing to PET scan in August 2021.  No significant disease progression.  Less likely due to the residual tumor.  Patient demands narcotics. I recommend patient to continue ibuprofen 800 mg 3 times daily.  Alternating with Tylenol.  I offered him to try ketorolac 30 mg IV x1 today. Refer to palliative care service.  Refer to pain clinic.   All questions were answered. The patient knows to call the clinic with any problems questions or concerns.  Return of visit: We will resume chemotherapy after he finishes 2D echo. Preston Server, MD, PhD Hematology [name] Pager- [phone] 09/01/2020

## 2020-09-01 NOTE — Progress Notes (Signed)
Patient here for follow up. Pt reports pain to left side: chest, shoulder, back and arm. Pt was seen in ER at the end of Dec due to pain to same sites and was given Percocet, but has not ran out of medication. Per patient, pain has ben constant and is requesting something for pain.

## 2020-09-04 ENCOUNTER — Inpatient Hospital Stay: Payer: Self-pay | Admitting: Hospice and Palliative Medicine

## 2020-09-08 ENCOUNTER — Other Ambulatory Visit: Payer: Self-pay

## 2020-09-08 ENCOUNTER — Ambulatory Visit
Admission: RE | Admit: 2020-09-08 | Discharge: 2020-09-08 | Disposition: A | Discharge status: Home / Self Care | Payer: Self-pay | Source: Ambulatory Visit | Attending: Oncology | Admitting: Oncology

## 2020-09-08 DIAGNOSIS — F17210 Nicotine dependence, cigarettes, uncomplicated: Secondary | ICD-10-CM | POA: Insufficient documentation

## 2020-09-08 DIAGNOSIS — Z0189 Encounter for other specified special examinations: Secondary | ICD-10-CM

## 2020-09-08 DIAGNOSIS — Z01818 Encounter for other preprocedural examination: Secondary | ICD-10-CM | POA: Insufficient documentation

## 2020-09-08 DIAGNOSIS — Z79899 Other long term (current) drug therapy: Secondary | ICD-10-CM | POA: Insufficient documentation

## 2020-09-08 DIAGNOSIS — C8172 Other classical Hodgkin lymphoma, intrathoracic lymph nodes: Secondary | ICD-10-CM

## 2020-09-08 LAB — ECHOCARDIOGRAM LIMITED
AR max vel: 2.2 cm2
AV Area VTI: 2.13 cm2
AV Area mean vel: 2.19 cm2
AV Mean grad: 5 mmHg
AV Peak grad: 9.1 mmHg
Ao pk vel: 1.51 m/s
Area-P 1/2: 5.79 cm2
MV VTI: 2.6 cm2
S' Lateral: 3.7 cm

## 2020-09-08 NOTE — Progress Notes (Signed)
*  PRELIMINARY RESULTS* Echocardiogram 2D Echocardiogram has been performed.  Preston [name] 09/08/2020, 10:36 AM

## 2020-09-11 ENCOUNTER — Telehealth: Payer: Self-pay

## 2020-09-11 NOTE — Telephone Encounter (Signed)
Received fax from [place] that referral from Dr. Tasia Catchings has been denied.

## 2020-09-15 ENCOUNTER — Other Ambulatory Visit: Payer: Self-pay

## 2020-09-15 ENCOUNTER — Encounter: Payer: Self-pay | Admitting: Hospice and Palliative Medicine

## 2020-09-15 ENCOUNTER — Inpatient Hospital Stay: Payer: Self-pay | Attending: Oncology

## 2020-09-15 ENCOUNTER — Encounter: Payer: Self-pay | Admitting: Oncology

## 2020-09-15 ENCOUNTER — Inpatient Hospital Stay (HOSPITAL_BASED_OUTPATIENT_CLINIC_OR_DEPARTMENT_OTHER): Payer: Self-pay | Admitting: Hospice and Palliative Medicine

## 2020-09-15 ENCOUNTER — Inpatient Hospital Stay (HOSPITAL_BASED_OUTPATIENT_CLINIC_OR_DEPARTMENT_OTHER): Payer: Self-pay | Admitting: Oncology

## 2020-09-15 ENCOUNTER — Inpatient Hospital Stay: Payer: Self-pay

## 2020-09-15 VITALS — BP 123/95 | HR 84 | Temp 98.0°F | Resp 18 | Wt 175.1 lb

## 2020-09-15 DIAGNOSIS — R0600 Dyspnea, unspecified: Secondary | ICD-10-CM

## 2020-09-15 DIAGNOSIS — Z79899 Other long term (current) drug therapy: Secondary | ICD-10-CM | POA: Insufficient documentation

## 2020-09-15 DIAGNOSIS — M25512 Pain in left shoulder: Secondary | ICD-10-CM

## 2020-09-15 DIAGNOSIS — C8192 Hodgkin lymphoma, unspecified, intrathoracic lymph nodes: Secondary | ICD-10-CM | POA: Insufficient documentation

## 2020-09-15 DIAGNOSIS — R0789 Other chest pain: Secondary | ICD-10-CM | POA: Insufficient documentation

## 2020-09-15 DIAGNOSIS — Z515 Encounter for palliative care: Secondary | ICD-10-CM

## 2020-09-15 DIAGNOSIS — M549 Dorsalgia, unspecified: Secondary | ICD-10-CM | POA: Insufficient documentation

## 2020-09-15 DIAGNOSIS — C8172 Other classical Hodgkin lymphoma, intrathoracic lymph nodes: Secondary | ICD-10-CM

## 2020-09-15 DIAGNOSIS — Z5111 Encounter for antineoplastic chemotherapy: Secondary | ICD-10-CM | POA: Insufficient documentation

## 2020-09-15 DIAGNOSIS — M79603 Pain in arm, unspecified: Secondary | ICD-10-CM | POA: Insufficient documentation

## 2020-09-15 DIAGNOSIS — G893 Neoplasm related pain (acute) (chronic): Secondary | ICD-10-CM

## 2020-09-15 DIAGNOSIS — R0602 Shortness of breath: Secondary | ICD-10-CM | POA: Insufficient documentation

## 2020-09-15 LAB — COMPREHENSIVE METABOLIC PANEL
ALT: 16 U/L (ref 0–44)
AST: 22 U/L (ref 15–41)
Albumin: 4.3 g/dL (ref 3.5–5.0)
Alkaline Phosphatase: 44 U/L (ref 38–126)
Anion gap: 13 (ref 5–15)
BUN: 14 mg/dL (ref 6–20)
CO2: 23 mmol/L (ref 22–32)
Calcium: 9.3 mg/dL (ref 8.9–10.3)
Chloride: 102 mmol/L (ref 98–111)
Creatinine, Ser: 0.81 mg/dL (ref 0.61–1.24)
GFR, Estimated: >60 mL/min (ref >60)
Glucose, Bld: 118 mg/dL — ABNORMAL HIGH (ref 70–99)
Potassium: 3.7 mmol/L (ref 3.5–5.1)
Sodium: 138 mmol/L (ref 135–145)
Total Bilirubin: 1 mg/dL (ref 0.3–1.2)
Total Protein: 8 g/dL (ref 6.5–8.1)

## 2020-09-15 LAB — CBC WITH DIFFERENTIAL/PLATELET
Abs Immature Granulocytes: 0.02 10*3/uL (ref 0.00–0.07)
Basophils Absolute: 0 10*3/uL (ref 0.0–0.1)
Basophils Relative: 0 %
Eosinophils Absolute: 0.1 10*3/uL (ref 0.0–0.5)
Eosinophils Relative: 1 %
HCT: 37.6 % — ABNORMAL LOW (ref 39.0–52.0)
Hemoglobin: 14 g/dL (ref 13.0–17.0)
Immature Granulocytes: 0 %
Lymphocytes Relative: 26 %
Lymphs Abs: 1.6 10*3/uL (ref 0.7–4.0)
MCH: 32.6 pg (ref 26.0–34.0)
MCHC: 37.2 g/dL — ABNORMAL HIGH (ref 30.0–36.0)
MCV: 87.6 fL (ref 80.0–100.0)
Monocytes Absolute: 0.7 10*3/uL (ref 0.1–1.0)
Monocytes Relative: 10 %
Neutro Abs: 4 10*3/uL (ref 1.7–7.7)
Neutrophils Relative %: 63 %
Platelets: 287 10*3/uL (ref 150–400)
RBC: 4.29 MIL/uL (ref 4.22–5.81)
RDW: 12.9 % (ref 11.5–15.5)
WBC: 6.3 10*3/uL (ref 4.0–10.5)
nRBC: 0 % (ref 0.0–0.2)

## 2020-09-15 LAB — C-REACTIVE PROTEIN: CRP: 0.5 mg/dL (ref <1.0)

## 2020-09-15 MED ORDER — SODIUM CHLORIDE 0.9% FLUSH
10.0000 mL | INTRAVENOUS | Status: DC | PRN
  Filled 2020-09-15: qty 10

## 2020-09-15 MED ORDER — DOXORUBICIN HCL CHEMO IV INJECTION 2 MG/ML
25.0000 mg/m2 | Freq: Once | INTRAVENOUS | Status: AC
  Administered 2020-09-15: 46 mg via INTRAVENOUS
  Filled 2020-09-15: qty 23

## 2020-09-15 MED ORDER — HEPARIN SOD (PORK) LOCK FLUSH 100 UNIT/ML IV SOLN
INTRAVENOUS | Status: AC
  Filled 2020-09-15: qty 5

## 2020-09-15 MED ORDER — HEPARIN SOD (PORK) LOCK FLUSH 100 UNIT/ML IV SOLN
500.0000 [IU] | Freq: Once | INTRAVENOUS | Status: AC | PRN
  Administered 2020-09-15: 500 [IU]
  Filled 2020-09-15: qty 5

## 2020-09-15 MED ORDER — CELECOXIB 100 MG PO CAPS: 100.0000 mg | ORAL_CAPSULE | Freq: Two times a day (BID) | ORAL | 0 refills | Status: DC

## 2020-09-15 MED ORDER — VINBLASTINE SULFATE CHEMO INJECTION 1 MG/ML
6.0000 mg/m2 | Freq: Once | INTRAVENOUS | Status: AC
  Administered 2020-09-15: 11.2 mg via INTRAVENOUS
  Filled 2020-09-15: qty 11.2

## 2020-09-15 MED ORDER — KETOROLAC TROMETHAMINE 15 MG/ML IJ SOLN
30.0000 mg | Freq: Once | INTRAMUSCULAR | Status: AC
  Administered 2020-09-15: 30 mg via INTRAVENOUS
  Filled 2020-09-15: qty 2

## 2020-09-15 MED ORDER — SODIUM CHLORIDE 0.9 % IV SOLN
Freq: Once | INTRAVENOUS | Status: AC
  Filled 2020-09-15: qty 250

## 2020-09-15 MED ORDER — PALONOSETRON HCL INJECTION 0.25 MG/5ML
0.2500 mg | Freq: Once | INTRAVENOUS | Status: AC
  Administered 2020-09-15: 0.25 mg via INTRAVENOUS
  Filled 2020-09-15: qty 5

## 2020-09-15 MED ORDER — SODIUM CHLORIDE 0.9 % IV SOLN
150.0000 mg | Freq: Once | INTRAVENOUS | Status: AC
  Administered 2020-09-15: 150 mg via INTRAVENOUS
  Filled 2020-09-15: qty 150

## 2020-09-15 MED ORDER — SODIUM CHLORIDE 0.9 % IV SOLN
375.0000 mg/m2 | Freq: Once | INTRAVENOUS | Status: AC
  Administered 2020-09-15: 700 mg via INTRAVENOUS
  Filled 2020-09-15: qty 70

## 2020-09-15 MED ORDER — OMEPRAZOLE 20 MG PO CPDR: 20.0000 mg | DELAYED_RELEASE_CAPSULE | Freq: Every day | ORAL | 0 refills | Status: DC

## 2020-09-15 MED ORDER — SODIUM CHLORIDE 0.9 % IV SOLN
10.0000 mg | Freq: Once | INTRAVENOUS | Status: AC
  Administered 2020-09-15: 10 mg via INTRAVENOUS
  Filled 2020-09-15: qty 10

## 2020-09-15 NOTE — Progress Notes (Signed)
[place]  [phone] [phone]   Name: Preston [name] Date: 09/15/2020 MRN: [id number]  DOB: March 02, 1986  Patient Care Team: Patient, No Pcp Per as PCP - General (General Practice)    REASON FOR CONSULTATION: Preston [name] is a 35 y.o. male with multiple medical problems including stage IIIb Hodgkin's lymphoma on systemic treatment with AVD.  Patient has had severe pain leading to ER evaluation in December 2021.  Patient was started on Percocet.  Etiology of pain was unclear so patient underwent repeat PET scan to evaluate for disease progression.  PET scan on 08/23/2020 revealed stable size and mediastinal mass with improvement and hepatoduodenal lymphadenopathy.  Patient is referred to palliative care to help address goals and manage ongoing symptoms.  SOCIAL HISTORY:     reports that he has been smoking. He has a 10.00 pack-year smoking history. He has never used smokeless tobacco. He reports current alcohol use. He reports previous drug use.  Patient is originally from [place] and moved to [place] to be with his child's mother.  He was originally living in [facility] near [place], [place].  However, he moved to [place] to be with his girlfriend.  He has 4 children ranging in ages from 75-17.  Both of his parents are deceased.  His siblings are [place].  He has a nephew who lives locally.  Patient has history of incarceration.  He is starting a job as a Dentist.  ADVANCE DIRECTIVES:  Does not have  CODE STATUS: Full code  PAST MEDICAL HISTORY: Past Medical History:  Diagnosis Date  . Hodgkin's lymphoma ([place])   . Tobacco use     PAST SURGICAL HISTORY: No past surgical history on file.  HEMATOLOGY/ONCOLOGY HISTORY:  Oncology History  Malignant lymphoma, Hodgkin's type ([place])  12/30/2019 Initial Diagnosis   Hodgkin lymphoma of intrathoracic lymph nodes ([place])    05/17/2020 Cancer Staging   Staging form: Hodgkin and Non-Hodgkin Lymphoma, AJCC 8th Edition - Clinical: Stage III (Hodgkin lymphoma, B - Symptoms) - Signed by Earlie Server, MD on 05/17/2020   06/26/2020 -  Chemotherapy    Patient is on Treatment Plan: HODGKINS LYMPHOMA AVD Q28D X 4 CYCLES        ALLERGIES:  has No Known Allergies.  MEDICATIONS:  Current Outpatient Medications  Medication Sig Dispense Refill  . lidocaine-prilocaine (EMLA) cream Apply 1 application topically as needed. 30 g 1  . ondansetron (ZOFRAN) 8 MG tablet Take 1 tablet (8 mg total) by mouth 2 (two) times daily. 60 tablet 1  . oxyCODONE-acetaminophen (PERCOCET) 5-325 MG tablet Take 1 tablet by mouth every 4 (four) hours as needed for severe pain. (Patient not taking: No sig reported) 20 tablet 0  . prochlorperazine (COMPAZINE) 10 MG tablet Take 1 tablet (10 mg total) by mouth every 6 (six) hours as needed for nausea or vomiting. 60 tablet 1   No current facility-administered medications for this visit.   Facility-Administered Medications Ordered in Other Visits  Medication Dose Route Frequency Provider Last Rate Last Admin  . dacarbazine (DTIC) 700 mg in sodium chloride 0.9 % 250 mL chemo infusion  375 mg/m2 (Order-Specific) Intravenous Once Earlie Server, MD      . dexamethasone (DECADRON) 10 mg in sodium chloride 0.9 % 50 mL IVPB  10 mg Intravenous Once Earlie Server, MD      . DOXOrubicin (ADRIAMYCIN) chemo injection 46 mg  25 mg/m2 (Order-Specific) Intravenous Once Earlie Server, MD      .  fosaprepitant (EMEND) 150 mg in sodium chloride 0.9 % 145 mL IVPB  150 mg Intravenous Once Earlie Server, MD      . heparin lock flush 100 unit/mL  500 Units Intravenous Once Earlie Server, MD      . heparin lock flush 100 unit/mL  500 Units Intracatheter Once PRN Earlie Server, MD      . ketorolac (TORADOL) 15 MG/ML injection 30 mg  30 mg Intravenous Once Earlie Server, MD      . palonosetron (ALOXI) injection 0.25 mg  0.25 mg Intravenous Once Earlie Server, MD       . sodium chloride flush (NS) 0.9 % injection 10 mL  10 mL Intravenous PRN Earlie Server, MD   10 mL at 08/07/20 0826  . sodium chloride flush (NS) 0.9 % injection 10 mL  10 mL Intracatheter PRN Earlie Server, MD      . vinBLAStine (VELBAN) 11.2 mg in sodium chloride 0.9 % 50 mL chemo infusion  6 mg/m2 (Order-Specific) Intravenous Once Earlie Server, MD        VITAL SIGNS: There were no vitals taken for this visit. There were no vitals filed for this visit.  Estimated body mass index is 25.12 kg/m as calculated from the following:   Height as of 08/07/20: 5\' 10"  (1.778 m).   Weight as of an earlier encounter on 09/15/20: 175 lb 1.6 oz (79.4 kg).  LABS: CBC:    Component Value Date/Time   WBC 6.3 09/15/2020 0810   HGB 14.0 09/15/2020 0810   HCT 37.6 (L) 09/15/2020 0810   PLT 287 09/15/2020 0810   MCV 87.6 09/15/2020 0810   NEUTROABS 4.0 09/15/2020 0810   LYMPHSABS 1.6 09/15/2020 0810   MONOABS 0.7 09/15/2020 0810   EOSABS 0.1 09/15/2020 0810   BASOSABS 0.0 09/15/2020 0810   Comprehensive Metabolic Panel:    Component Value Date/Time   NA 138 09/15/2020 0810   K 3.7 09/15/2020 0810   CL 102 09/15/2020 0810   CO2 23 09/15/2020 0810   BUN 14 09/15/2020 0810   CREATININE 0.81 09/15/2020 0810   GLUCOSE 118 (H) 09/15/2020 0810   CALCIUM 9.3 09/15/2020 0810   AST 22 09/15/2020 0810   ALT 16 09/15/2020 0810   ALKPHOS 44 09/15/2020 0810   BILITOT 1.0 09/15/2020 0810   PROT 8.0 09/15/2020 0810   ALBUMIN 4.3 09/15/2020 0810    RADIOGRAPHIC STUDIES: NM PET Image Initial (PI) Skull Base To Thigh  Result Date: 08/23/2020 CLINICAL DATA:  Initial treatment strategy for lymphadenopathy. History of Hodgkin's lymphoma. Mediastinal lymphadenopathy on CT chest 08/07/2020. EXAM: NUCLEAR MEDICINE PET SKULL BASE TO THIGH TECHNIQUE: 9.1 mCi F-18 FDG was injected intravenously. Full-ring PET imaging was performed from the skull base to thigh after the radiotracer. CT data was obtained and used for attenuation  correction and anatomic localization. Fasting blood glucose: 108 mg/dl COMPARISON:  Outside PET-CT from new [place] [facility] dated 03/24/2020. This is of limited benefit as images cannot be loaded into interpretation software. FINDINGS: Mediastinal blood pool activity: SUV max 1.8 Liver activity: SUV max 3.0 NECK: Focal hypermetabolism identified in a tiny right-sided level III lymph node with SUV max = 2.0 (image 40/series 3). No lymphadenopathy by CT imaging. Incidental CT findings: none CHEST: Bulky anterior mediastinal nodal conglomeration measuring 6.7 x 4.6 cm on image 93/3 was 6.5 x 4.6 cm previously (remeasured). SUV max = 2.4 today. SUV measurements unavailable on prior study but qualitatively similar and reported at 2.0 in records on [facility] Everywhere. No marked hilar or axillary hypermetabolism. No gross hilar lymphadenopathy on noncontrast CT imaging. No hilar lymphadenopathy. Incidental CT findings: Left PICC line tip projects in the lower right atrium. ABDOMEN/PELVIS: No abnormal hypermetabolic activity within the liver, pancreas, adrenal glands, or spleen. 1.4 cm short axis hepatoduodenal ligament lymph node on 147/3 is mildly enlarged, but stable since prior outside study at 1.6 cm (remeasured). SUV max = 2.4 on today's PET imaging. No other hypermetabolic lymph nodes in the abdomen or pelvis. Incidental CT findings: No splenomegaly. There is trace abdominal aortic atherosclerosis without aneurysm. SKELETON: Low level uptake identified lateral right sixth rib associated with apparent nonacute nondisplaced fracture (CT image 104/3 Incidental CT findings: none IMPRESSION: 1. Dominant right paramidline anterior mediastinal mass lesion is stable in size comparing to outside study of 03/24/2020. This lesion shows low level FDG uptake (Deauville 3), also not appreciably changed in the interval. 2. Mildly enlarged hepatoduodenal ligament lymph node measures minimally smaller on CT imaging today  with Deauville 2 uptake. 3. No additional areas of unexpected or suspicious hypermetabolism. No other findings of lymphadenopathy on noncontrast CT imaging today. Electronically Signed   By: Misty Stanley M.D.   On: 08/23/2020 14:30   ECHOCARDIOGRAM LIMITED  Result Date: 09/08/2020    ECHOCARDIOGRAM LIMITED REPORT   Patient Name:   Preston [name] Date of Exam: 09/08/2020 Medical Rec #:  [id number]           Height:       70.0 in Accession #:    [id number]          Weight:       175.5 lb Date of Birth:  [birth year]            BSA:          1.975 m Patient Age:    39 years            BP:           136/97 mmHg Patient Gender: M                   HR:           82 bpm. Exam Location:  [facility] Procedure: 2D Echo, Color Doppler, Cardiac Doppler and Strain Analysis Indications:     v58.11 Chemotherapy evaluation  History:         Patient has no prior history of Echocardiogram examinations.                  Risk Factors:Current Smoker.  Sonographer:     Charmayne Sheer RDCS (AE) Referring Phys:  [id number] ZHOU YU Diagnosing Phys: Ida Rogue MD  Sonographer Comments: Global longitudinal strain was attempted. IMPRESSIONS  1. Left ventricular ejection fraction, by estimation, is 50 to 55%. Left ventricular ejection fraction by PLAX is 51 %. The left ventricle has low normal function. The left ventricle has no regional wall motion abnormalities. Left ventricular diastolic parameters were normal. The average left ventricular global longitudinal strain is -16.1 %.  2. Right ventricular systolic function is normal. The right ventricular size is normal. Tricuspid regurgitation signal is inadequate for assessing PA pressure.  3. FINDINGS  Left Ventricle: Left ventricular ejection fraction, by estimation, is 50 to 55%. Left ventricular ejection fraction by PLAX is 51 %. The left ventricle has low normal function. The left ventricle has no regional wall motion abnormalities. The average left ventricular global longitudinal strain is  -16.1 %. The left ventricular internal cavity size was  normal in size. Left ventricular diastolic parameters were normal. Right Ventricle: The right ventricular size is normal. No increase in right ventricular wall thickness. Right ventricular systolic function is normal. Tricuspid regurgitation signal is inadequate for assessing PA pressure. Left Atrium: Left atrial size was normal in size. Right Atrium: Right atrial size was normal in size. Pericardium: There is no evidence of pericardial effusion. Mitral Valve: The mitral valve is normal in structure. Trivial mitral valve regurgitation. MV peak gradient, 4.8 mmHg. The mean mitral valve gradient is 2.0 mmHg. Tricuspid Valve: The tricuspid valve is normal in structure. Tricuspid valve regurgitation is mild. Aortic Valve: The aortic valve is normal in structure. Aortic valve regurgitation is not visualized. No aortic stenosis is present. Aortic valve mean gradient measures 5.0 mmHg. Aortic valve peak gradient measures 9.1 mmHg. Aortic valve area, by VTI measures 2.13 cm. Pulmonic Valve: The pulmonic valve was normal in structure. Pulmonic valve regurgitation is trivial. Aorta: The aortic root and ascending aorta are structurally normal, with no evidence of dilitation. Venous: The pulmonary veins were not well visualized. IAS/Shunts: No atrial level shunt detected by color flow Doppler. LEFT VENTRICLE PLAX 2D LV EF:         Left            Diastology                ventricular     LV e' medial:    8.16 cm/s                ejection        LV E/e' medial:  11.2                fraction by     LV e' lateral:   9.68 cm/s                PLAX is 51      LV E/e' lateral: 9.5                %. LVIDd:         5.00 cm         2D LVIDs:         3.70 cm         Longitudinal LV PW:         1.00 cm         Strain LV IVS:        0.70 cm         2D Strain GLS  -18.3 % LVOT diam:     1.90 cm         (A2C): LV SV:         62              2D Strain GLS  -14.4 % LV SV Index:   32               (A3C): LVOT Area:     2.84 cm        2D Strain GLS  -15.6 %                                (A4C):                                2D Strain GLS  -16.1 %  Avg: RIGHT VENTRICLE RV Basal diam:  2.70 cm RV S prime:     13.30 cm/s TAPSE (M-mode): 2.4 cm LEFT ATRIUM           Index       RIGHT ATRIUM           Index LA diam:      2.80 cm 1.42 cm/m  RA Area:     10.60 cm LA Vol (A2C): 39.6 ml 20.06 ml/m RA Volume:   22.50 ml  11.39 ml/m LA Vol (A4C): 40.1 ml 20.31 ml/m  AORTIC VALVE                    PULMONIC VALVE AV Area (Vmax):    2.20 cm     PV Vmax:       0.78 m/s AV Area (Vmean):   2.19 cm     PV Vmean:      59.000 cm/s AV Area (VTI):     2.13 cm     PV VTI:        0.159 m AV Vmax:           151.00 cm/s  PV Peak grad:  2.5 mmHg AV Vmean:          112.000 cm/s PV Mean grad:  1.0 mmHg AV VTI:            0.293 m AV Peak Grad:      9.1 mmHg AV Mean Grad:      5.0 mmHg LVOT Vmax:         117.00 cm/s LVOT Vmean:        86.600 cm/s LVOT VTI:          0.220 m LVOT/AV VTI ratio: 0.75  AORTA Ao Root diam: 2.80 cm MITRAL VALVE MV Area (PHT): 5.79 cm    SHUNTS MV Area VTI:   2.60 cm    Systemic VTI:  0.22 m MV Peak grad:  4.8 mmHg    Systemic Diam: 1.90 cm MV Mean grad:  2.0 mmHg MV Vmax:       1.09 m/s MV Vmean:      71.4 cm/s MV Decel Time: 131 msec MV E velocity: 91.70 cm/s MV A velocity: 67.30 cm/s MV E/A ratio:  1.36 Ida Rogue MD Electronically signed by Ida Rogue MD Signature Date/Time: 09/08/2020/11:00:45 AM    Final     PERFORMANCE STATUS (ECOG) : 1 - Symptomatic but completely ambulatory  Review of Systems Unless otherwise noted, a complete review of systems is negative.  Physical Exam General: NAD Cardiovascular: regular rate and rhythm Pulmonary: clear ant fields Abdomen: soft, nontender, + bowel sounds GU: no suprapubic tenderness Extremities: no edema, no joint deformities Skin: no rashes Neurological: Nonfocal  IMPRESSION: Patient seen in  the infusion area while he was receiving treatment.  Patient endorses progressive chest, left shoulder, upper back, and neck pain over the past month.  Pain is characterized as severe and often feels like electricity shooting down his arm.  He received CTA on 08/07/2020, which did not reveal PE.  PET scan on 08/23/2020 showed stable mediastinal mass.  Patient's also had cardiac work-up including echocardiogram and was referred to cardiology.  Likely pain is secondary, at least in part, to his mediastinal mass.  However, PET scan showed relatively stable disease and did not explain why pain would have increased in intensity over the past 1 to 2 months.  It would also seem atypical for pain to radiate  from mediastinum up to neck and then down to the back/arm.  Will obtain MRI cervical spine.  Patient is currently taking acetaminophen daily for pain but reports that it does not help.  He says that the Percocet prescribed in the ED in December was most effective at controlling the pain.  However, he is not an ideal candidate for opioids given his age, history of incarceration, occupation (starting job as a Dentist), and unclear etiology of the pain.  Patient has also received ketorolac in the clinic and that has helped significantly with the pain.    We will plan to start him on Celebrex 100 mg twice daily as needed.  Can increase to 200 mg twice daily if needed.  Would recommend starting prophylactic PPI.  Monitor renal function.  Patient was referred to [facility] pain clinic but was unable to establish care.  Patient does drive and would be in agreement with referral to [facility] pain clinic. WIll refer to [facility] in [place].   PLAN: -Continue current scope of treatment -MRI cervical spine with/without contrast -Start Celebrex 100 mg twice daily as needed. May trial increase to 200mg  BID if needed.  -Prophylactic PPI with omeprazole 20 mg daily -Referral to [facility]  in [place] as needed  Case and plan discussed with Dr. Tasia Catchings   Patient expressed understanding and was in agreement with this plan. He also understands that He can call the clinic at any time with any questions, concerns, or complaints.     Time Total: 30 minutes  Visit consisted of counseling and education dealing with the complex and emotionally intense issues of symptom management and palliative care in the setting of serious and potentially life-threatening illness.Greater than 50%  of this time was spent counseling and coordinating care related to the above assessment and plan.  Signed by: Altha Harm, PhD, NP-C

## 2020-09-15 NOTE — Progress Notes (Signed)
Patient to infusion  Clinic complaining of left shoulder pain , team messaged and aware , NP Broader at side to see patient , Toradol ordered and given.

## 2020-09-15 NOTE — Progress Notes (Signed)
Patient here for follow up. No new concerns voiced. Patient reports pain 8/10 to left chest/shoulder area.

## 2020-09-15 NOTE — Progress Notes (Signed)
Hematology/Oncology follow up note [facility] Telephone:([phone] [phone]   Patient Care Team: Patient, No Pcp Per as PCP - General (General Practice)  REFERRING PROVIDER: No ref. provider found  CHIEF COMPLAINTS/REASON FOR VISIT:  Follow up for hodgkin's lymphoma.   HISTORY OF PRESENTING ILLNESS:   Preston [name] is a  35 y.o.  male with PMH listed below was seen in consultation at the request of  No ref. provider found  for evaluation of Hodgkin's lymphoma.  Patient has a known diagnosis of Hodgkin's lymphoma and wants to transfer his oncology care to our cancer center.  Extensive medical records reviewed was performed  [facility] care was [facility]  10/28/2019 biopsy of right chest wall mass, fibromuscular soft tissue with atypical infiltrate consistent with Hodgkin lymphoma, classical type. There were scattered single atypical cells with morphology suggestive of Jaclynn Guarneri variant cells. These cells were reactive with CD 45, CD 15, CD 30 and nonreactive for pan keratin, cam 5.7, CD3, CD5, CD10, CD 20, melan-A, Sox 10. Ki-67 stained majority of Reed-Sternberg like cells.  Noticed right upper chest wall lump in January 2021. He was incarcerated for 27 months, released in May 2021.  Unintentional weight loss about 30 pounds, drenching night sweats.  01/17/2020 PET scan showed bulky anterior mediastinal mass 12.7cm. with extensive adenopathy in neck, right axilla, mediastinum, bilateral hilar regions, upper abdominal ligament adenopathy and retroperitoneal adenopathy. Abnormal hypermetabolic activity within a splenic focus. Mild patchy ground glass opacity in the right lower lobe may reflect postobstructive inflammatory change without hypermetabolic activity.   01/17/2020 Echo 1. Normal biventricular systolic function and size. LVEF 60%. Averageglobal longitudinal strain is normal, -20%  2. No significant  valvular dysfunction.  3. Normal right-and left-sided filling pressures. 4. No prior study for comparison.  12/30/2019 LDH 588 01/24/2020 CRP 67.2, ESR 60  Stage IIIB Hodgkin's lymphoma, he was recommended ABVD x 2 cycles, followed by PET with plan of AVD x 4 or escalate to BEACOPP if not favorable results.   01/26/2020 ABVD Day 1,15 x 2 cycles.   03/24/2020 PET scan showed significant interval response to therapy. All PET positive lesions previously resolved 03/30/2020 AVD D1 04/13/2020 AVD D15  He was not able to have chest medi port placed due to chest mass. He has central line placed on left upper extremity. #Central line access Patient has seen Dr. Lucky Cowboy for evaluation of the left upper extremity central line.  I discussed with Dr. Lucky Cowboy and tip is in good position in the SVC.  Okay to use from vascular surgeon's aspect.  [facility] pathology consultation on biopsy specimen from 10/28/2019 showed findings compatible with classic Hodgkin's lymphoma.  # Last AVD chemotherapy was given on 07/10/2020.  He no showed for his chemotherapy appointment in mid December 2021.  He was seen by me on 08/07/2020 at that time he reports acute onset of chest pain, left shoulder, back and left arm. He was sent to emergency room to have a CT scan done to rule out acute pulmonary embolism 08/07/2020, CT chest angiogram showed no pulmonary emboli or acute chest vascular pathology. Superior mediastinal lymphadenopathy consistent with clinical history of lymphoma.  Largest node measures 8.3 x 8.3 x 3.8 cm.  Several other smaller but pathologic lymph nodes in the anterior mediastinum, second largest at the aortopulmonary window measuring 1.3 x 1.9 x 3.1 cm.  No pulmonary mass or nodule.  Patient also had negative troponin, Patient was discharged home for  further follow-up with cancer center.  ER physician provided a prescription of Percocet 5/325 mg 1 tablet every 4 hours as needed for severe pain.  I obtained additional  work-up for work-up to see if there is lymphoma progression.  08/23/2020 PET scan showed dominant right paramidline anterior mediastinal mass lesion is stable in size comparing to her outside PET scan on 03/24/2020.  Lesion showed low-level FDG uptake Douville 3, not appreciably changed in the interval.  Mildly enlarged hepatoduodenal ligament lymph node measures minimally smaller on CT imaging today with Douville 2 uptake.  No additional area of unexpected or suspicious hypermetabolic zone.  No other findings of lymphadenopathy on noncontrast CT image today.  Patient was recommended to proceed with 2D echocardiogram and he had Echo done on 09/08/2020. The delay was due to him not showing up to appointment.    INTERVAL HISTORY Preston [name] is a 35 y.o. male who has above history reviewed by me today presents for follow up visit for management of Hodgkin's  Lymphoma  He reports that he continues to have severe left chest pain, back pain, arm pain, shoulder pain for which he takes ibuprofen and Tylenol with no relief.  He also has ran out the Percocet that was given by emergency room. He has good appetite.  Patient's weight is stable.  Review of Systems  Constitutional: Negative for appetite change, chills, fatigue, fever and unexpected weight change.  HENT:   Negative for hearing loss and voice change.   Eyes: Negative for eye problems and icterus.  Respiratory: Negative for chest tightness, cough and shortness of breath.   Cardiovascular: Negative for leg swelling.       Chest wall pain  Gastrointestinal: Negative for abdominal distention and abdominal pain.  Endocrine: Negative for hot flashes.  Genitourinary: Negative for difficulty urinating, dysuria and frequency.   Musculoskeletal: Positive for back pain. Negative for arthralgias and neck pain.       Left shoulder pain  Skin: Negative for itching and rash.  Neurological: Negative for light-headedness and numbness.  Hematological:  Negative for adenopathy. Does not bruise/bleed easily.  Psychiatric/Behavioral: Negative for confusion.    MEDICAL HISTORY:  Past Medical History:  Diagnosis Date  . Hodgkin's lymphoma ([place])   . Tobacco use     SURGICAL HISTORY: History reviewed. No pertinent surgical history.  SOCIAL HISTORY: Social History   Socioeconomic History  . Marital status: Single    Spouse name: Not on file  . Number of children: Not on file  . Years of education: Not on file  . Highest education level: Not on file  Occupational History  . Not on file  Tobacco Use  . Smoking status: Current Every Day Smoker    Packs/day: 0.50    Years: 20.00    Pack years: 10.00  . Smokeless tobacco: Never Used  Vaping Use  . Vaping Use: Never used  Substance and Sexual Activity  . Alcohol use: Yes  . Drug use: Not Currently  . Sexual activity: Not on file  Other Topics Concern  . Not on file  Social History Narrative  . Not on file   Social Determinants of Health   Financial Resource Strain: Not on file  Food Insecurity: Not on file  Transportation Needs: Not on file  Physical Activity: Not on file  Stress: Not on file  Social Connections: Not on file  Intimate Partner Violence: Not on file    FAMILY HISTORY: Family History  Problem Relation Age of Onset  .  Heart failure Mother   . Pneumonia Father     ALLERGIES:  has No Known Allergies.  MEDICATIONS:  Current Outpatient Medications  Medication Sig Dispense Refill  . lidocaine-prilocaine (EMLA) cream Apply 1 application topically as needed. 30 g 1  . ondansetron (ZOFRAN) 8 MG tablet Take 1 tablet (8 mg total) by mouth 2 (two) times daily. 60 tablet 1  . prochlorperazine (COMPAZINE) 10 MG tablet Take 1 tablet (10 mg total) by mouth every 6 (six) hours as needed for nausea or vomiting. 60 tablet 1  . oxyCODONE-acetaminophen (PERCOCET) 5-325 MG tablet Take 1 tablet by mouth every 4 (four) hours as needed for severe pain. (Patient not  taking: No sig reported) 20 tablet 0   No current facility-administered medications for this visit.   Facility-Administered Medications Ordered in Other Visits  Medication Dose Route Frequency Provider Last Rate Last Admin  . heparin lock flush 100 unit/mL  500 Units Intravenous Once Earlie Server, MD      . sodium chloride flush (NS) 0.9 % injection 10 mL  10 mL Intravenous PRN Earlie Server, MD   10 mL at 08/07/20 0826     PHYSICAL EXAMINATION: ECOG PERFORMANCE STATUS: 1 - Symptomatic but completely ambulatory Vitals:   09/15/20 0846  BP: (!) 123/95  Pulse: 84  Resp: 18  Temp: 98 F (36.7 C)   Filed Weights   09/15/20 0846  Weight: 175 lb 1.6 oz (79.4 kg)    Physical Exam Constitutional:      General: He is not in acute distress. HENT:     Head: Normocephalic and atraumatic.  Eyes:     General: No scleral icterus. Cardiovascular:     Rate and Rhythm: Normal rate and regular rhythm.     Heart sounds: Normal heart sounds.  Pulmonary:     Effort: Pulmonary effort is normal. No respiratory distress.     Breath sounds: No wheezing.  Abdominal:     General: Bowel sounds are normal. There is no distension.     Palpations: Abdomen is soft.  Musculoskeletal:        General: No deformity. Normal range of motion.     Cervical back: Normal range of motion and neck supple.  Skin:    General: Skin is warm and dry.     Findings: No erythema or rash.  Neurological:     Mental Status: He is alert and oriented to person, place, and time. Mental status is at baseline.     Cranial Nerves: No cranial nerve deficit.     Coordination: Coordination normal.  Psychiatric:        Mood and Affect: Mood normal.   left upper arm central line access     LABORATORY DATA:  I have reviewed the data as listed Lab Results  Component Value Date   WBC 5.1 09/01/2020   HGB 13.1 09/01/2020   HCT 34.9 (L) 09/01/2020   MCV 87.5 09/01/2020   PLT 226 09/01/2020   Recent Labs    07/10/20 0838  08/07/20 0820 09/01/20 0843  NA 137 136 136  K 3.7 4.3 4.0  CL 101 100 103  CO2 _0 GLUCOSE 98 112* 99  BUN _1 CREATININE 0.68 0.75 0.69  CALCIUM 8.8* 9.5 8.9  GFRNONAA >60 >60 >60  PROT 7.8 7.7 7.2  ALBUMIN 4.1 3.9 4.2  AST 37 26 23  ALT 33 22 20  ALKPHOS 55 50 44  BILITOT 0.7 1.0 0.6  Iron/TIBC/Ferritin/ %Sat No results found for: IRON, TIBC, FERRITIN, IRONPCTSAT    RADIOGRAPHIC STUDIES: I have personally reviewed the radiological images as listed and agreed with the findings in the report. NM PET Image Initial (PI) Skull Base To Thigh  Result Date: 08/23/2020 CLINICAL DATA:  Initial treatment strategy for lymphadenopathy. History of Hodgkin's lymphoma. Mediastinal lymphadenopathy on CT chest 08/07/2020. EXAM: NUCLEAR MEDICINE PET SKULL BASE TO THIGH TECHNIQUE: 9.1 mCi F-18 FDG was injected intravenously. Full-ring PET imaging was performed from the skull base to thigh after the radiotracer. CT data was obtained and used for attenuation correction and anatomic localization. Fasting blood glucose: 108 mg/dl COMPARISON:  Outside PET-CT from [facility] dated 03/24/2020. This is of limited benefit as images cannot be loaded into interpretation software. FINDINGS: Mediastinal blood pool activity: SUV max 1.8 Liver activity: SUV max 3.0 NECK: Focal hypermetabolism identified in a tiny right-sided level III lymph node with SUV max = 2.0 (image 40/series 3). No lymphadenopathy by CT imaging. Incidental CT findings: none CHEST: Bulky anterior mediastinal nodal conglomeration measuring 6.7 x 4.6 cm on image 93/3 was 6.5 x 4.6 cm previously (remeasured). SUV max = 2.4 today. SUV measurements unavailable on prior study but qualitatively similar and reported at 2.0 in records on [place]. No marked hilar or axillary hypermetabolism. No gross hilar lymphadenopathy on noncontrast CT imaging. No hilar lymphadenopathy. Incidental CT findings: Left PICC line tip  projects in the lower right atrium. ABDOMEN/PELVIS: No abnormal hypermetabolic activity within the liver, pancreas, adrenal glands, or spleen. 1.4 cm short axis hepatoduodenal ligament lymph node on 147/3 is mildly enlarged, but stable since prior outside study at 1.6 cm (remeasured). SUV max = 2.4 on today's PET imaging. No other hypermetabolic lymph nodes in the abdomen or pelvis. Incidental CT findings: No splenomegaly. There is trace abdominal aortic atherosclerosis without aneurysm. SKELETON: Low level uptake identified lateral right sixth rib associated with apparent nonacute nondisplaced fracture (CT image 104/3 Incidental CT findings: none IMPRESSION: 1. Dominant right paramidline anterior mediastinal mass lesion is stable in size comparing to outside study of 03/24/2020. This lesion shows low level FDG uptake (Deauville 3), also not appreciably changed in the interval. 2. Mildly enlarged hepatoduodenal ligament lymph node measures minimally smaller on CT imaging today with Deauville 2 uptake. 3. No additional areas of unexpected or suspicious hypermetabolism. No other findings of lymphadenopathy on noncontrast CT imaging today. Electronically Signed   By: Misty Stanley M.D.   On: 08/23/2020 14:30   ECHOCARDIOGRAM LIMITED  Result Date: 09/08/2020    ECHOCARDIOGRAM LIMITED REPORT   Patient Name:   Preston [name] Date of Exam: 09/08/2020 Medical Rec #:  [id number]           Height:       70.0 in Accession #:    [id number]          Weight:       175.5 lb Date of Birth:  [birth year]            BSA:          1.975 m Patient Age:    34 years            BP:           136/97 mmHg Patient Gender: M                   HR:           82 bpm. Exam Location:  [facility] Procedure: 2D  Echo, Color Doppler, Cardiac Doppler and Strain Analysis Indications:     v58.11 Chemotherapy evaluation  History:         Patient has no prior history of Echocardiogram examinations.                  Risk Factors:Current Smoker.  Sonographer:      Charmayne Sheer RDCS (AE) Referring Phys:  [id number] Deavon Podgorski Diagnosing Phys: Ida Rogue MD  Sonographer Comments: Global longitudinal strain was attempted. IMPRESSIONS  1. Left ventricular ejection fraction, by estimation, is 50 to 55%. Left ventricular ejection fraction by PLAX is 51 %. The left ventricle has low normal function. The left ventricle has no regional wall motion abnormalities. Left ventricular diastolic parameters were normal. The average left ventricular global longitudinal strain is -16.1 %.  2. Right ventricular systolic function is normal. The right ventricular size is normal. Tricuspid regurgitation signal is inadequate for assessing PA pressure.  3. FINDINGS  Left Ventricle: Left ventricular ejection fraction, by estimation, is 50 to 55%. Left ventricular ejection fraction by PLAX is 51 %. The left ventricle has low normal function. The left ventricle has no regional wall motion abnormalities. The average left ventricular global longitudinal strain is -16.1 %. The left ventricular internal cavity size was normal in size. Left ventricular diastolic parameters were normal. Right Ventricle: The right ventricular size is normal. No increase in right ventricular wall thickness. Right ventricular systolic function is normal. Tricuspid regurgitation signal is inadequate for assessing PA pressure. Left Atrium: Left atrial size was normal in size. Right Atrium: Right atrial size was normal in size. Pericardium: There is no evidence of pericardial effusion. Mitral Valve: The mitral valve is normal in structure. Trivial mitral valve regurgitation. MV peak gradient, 4.8 mmHg. The mean mitral valve gradient is 2.0 mmHg. Tricuspid Valve: The tricuspid valve is normal in structure. Tricuspid valve regurgitation is mild. Aortic Valve: The aortic valve is normal in structure. Aortic valve regurgitation is not visualized. No aortic stenosis is present. Aortic valve mean gradient measures 5.0 mmHg. Aortic valve  peak gradient measures 9.1 mmHg. Aortic valve area, by VTI measures 2.13 cm. Pulmonic Valve: The pulmonic valve was normal in structure. Pulmonic valve regurgitation is trivial. Aorta: The aortic root and ascending aorta are structurally normal, with no evidence of dilitation. Venous: The pulmonary veins were not well visualized. IAS/Shunts: No atrial level shunt detected by color flow Doppler. LEFT VENTRICLE PLAX 2D LV EF:         Left            Diastology                ventricular     LV e' medial:    8.16 cm/s                ejection        LV E/e' medial:  11.2                fraction by     LV e' lateral:   9.68 cm/s                PLAX is 51      LV E/e' lateral: 9.5                %. LVIDd:         5.00 cm         2D LVIDs:         3.70 cm  Longitudinal LV PW:         1.00 cm         Strain LV IVS:        0.70 cm         2D Strain GLS  -18.3 % LVOT diam:     1.90 cm         (A2C): LV SV:         62              2D Strain GLS  -14.4 % LV SV Index:   32              (A3C): LVOT Area:     2.84 cm        2D Strain GLS  -15.6 %                                (A4C):                                2D Strain GLS  -16.1 %                                Avg: RIGHT VENTRICLE RV Basal diam:  2.70 cm RV S prime:     13.30 cm/s TAPSE (M-mode): 2.4 cm LEFT ATRIUM           Index       RIGHT ATRIUM           Index LA diam:      2.80 cm 1.42 cm/m  RA Area:     10.60 cm LA Vol (A2C): 39.6 ml 20.06 ml/m RA Volume:   22.50 ml  11.39 ml/m LA Vol (A4C): 40.1 ml 20.31 ml/m  AORTIC VALVE                    PULMONIC VALVE AV Area (Vmax):    2.20 cm     PV Vmax:       0.78 m/s AV Area (Vmean):   2.19 cm     PV Vmean:      59.000 cm/s AV Area (VTI):     2.13 cm     PV VTI:        0.159 m AV Vmax:           151.00 cm/s  PV Peak grad:  2.5 mmHg AV Vmean:          112.000 cm/s PV Mean grad:  1.0 mmHg AV VTI:            0.293 m AV Peak Grad:      9.1 mmHg AV Mean Grad:      5.0 mmHg LVOT Vmax:         117.00 cm/s LVOT  Vmean:        86.600 cm/s LVOT VTI:          0.220 m LVOT/AV VTI ratio: 0.75  AORTA Ao Root diam: 2.80 cm MITRAL VALVE MV Area (PHT): 5.79 cm    SHUNTS MV Area VTI:   2.60 cm    Systemic VTI:  0.22 m MV Peak grad:  4.8 mmHg    Systemic Diam: 1.90 cm MV Mean grad:  2.0 mmHg MV Vmax:       1.09 m/s  MV Vmean:      71.4 cm/s MV Decel Time: 131 msec MV E velocity: 91.70 cm/s MV A velocity: 67.30 cm/s MV E/A ratio:  1.36 Ida Rogue MD Electronically signed by Ida Rogue MD Signature Date/Time: 09/08/2020/11:00:45 AM    Final       ASSESSMENT & PLAN:  1. Encounter for antineoplastic chemotherapy   2. 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 Other chest pain   4. Pain in joint of left shoulder   5. Dyspnea, unspecified type    # Hodgkin's lymphoma, classic type Per previous Oncologist note, S/p 2 cycles of ABVD with favorable PET outcome (deuville 3), and then 1 cycle of AVD.  Patient switched care after relocating to [facility]. Received cycle 4-day 1 AVD on 06/26/2020, day 15 AVD on 07/10/2020. Cycle 5 day 1 was delayed due to patient no-show to his appointments. PET scan images were independently reviewed by me and discussed with patient. Comparing to his PET scan done in outside facility in August 2021, there is no significant interval changes of the size and hypermetabolic activity,[Deuville 3].  I will not switch chemotherapy regimen at this point.  Recommend him to complete the suggested course of AVD.  Labs are reviewed and discussed with patient.  Counts are stable. 09/08/2018  2D echocardiogram showed low normal LVEF 50% to 55%.  This is slightly decreased from his outside echocardiogram in June 2021 with LVEF of 60%. Proceed with cycle 5-day 15 AVD today. Recommend patient to establish care with cardiology for further evaluation and monitoring.  #Cough and shortness of breath with exertion.  Symptom since December 2021. CT chest angiogram  showed no pulmonary embolism. 2D echo-low normal LVEF. We will establish care with cardiology.   #Left chest/back/shoulder/arm pain Unknown etiology. This pain is acute onset since end of December 2021.  He did not have pain when he establish care with me in October 2021, and the PET scan appears to be similar comparing to PET scan in August 2021.  No significant disease progression.  Less likely due to the residual tumor.  Patient demands narcotics. I recommend patient to continue ibuprofen 800 mg 3 times daily.  Alternating with Tylenol.  Patient has been referred to establish care with palliative care service.  # All questions were answered. The patient knows to call the clinic with any problems questions or concerns.  Return of visit: 2 weeks for evaluation prior to next cycle of chemotherapy. Earlie Server, MD, PhD Hematology Oncology [facility] Pager- [phone] 09/15/2020

## 2020-09-18 ENCOUNTER — Encounter: Payer: Self-pay | Admitting: Hospice and Palliative Medicine

## 2020-09-19 ENCOUNTER — Ambulatory Visit: Admission: RE | Admit: 2020-09-19 | Payer: Self-pay | Source: Ambulatory Visit

## 2020-09-29 ENCOUNTER — Inpatient Hospital Stay: Payer: Self-pay

## 2020-09-29 ENCOUNTER — Inpatient Hospital Stay: Payer: Self-pay | Admitting: Oncology

## 2020-10-03 ENCOUNTER — Other Ambulatory Visit: Payer: Self-pay

## 2020-10-03 ENCOUNTER — Inpatient Hospital Stay (HOSPITAL_BASED_OUTPATIENT_CLINIC_OR_DEPARTMENT_OTHER): Payer: Self-pay | Admitting: Oncology

## 2020-10-03 ENCOUNTER — Inpatient Hospital Stay: Payer: Self-pay

## 2020-10-03 ENCOUNTER — Encounter: Payer: Self-pay | Admitting: Oncology

## 2020-10-03 VITALS — BP 130/89 | HR 99 | Temp 98.3°F | Resp 18 | Wt 170.0 lb

## 2020-10-03 VITALS — BP 142/68 | HR 89 | Resp 16

## 2020-10-03 DIAGNOSIS — C8172 Other classical Hodgkin lymphoma, intrathoracic lymph nodes: Secondary | ICD-10-CM

## 2020-10-03 DIAGNOSIS — Z5111 Encounter for antineoplastic chemotherapy: Secondary | ICD-10-CM

## 2020-10-03 DIAGNOSIS — C8170 Other classical Hodgkin lymphoma, unspecified site: Secondary | ICD-10-CM

## 2020-10-03 DIAGNOSIS — M25512 Pain in left shoulder: Secondary | ICD-10-CM

## 2020-10-03 DIAGNOSIS — R0600 Dyspnea, unspecified: Secondary | ICD-10-CM

## 2020-10-03 DIAGNOSIS — R0789 Other chest pain: Secondary | ICD-10-CM

## 2020-10-03 LAB — COMPREHENSIVE METABOLIC PANEL
ALT: 25 U/L (ref 0–44)
AST: 33 U/L (ref 15–41)
Albumin: 4.7 g/dL (ref 3.5–5.0)
Alkaline Phosphatase: 55 U/L (ref 38–126)
Anion gap: 16 — ABNORMAL HIGH (ref 5–15)
BUN: 10 mg/dL (ref 6–20)
CO2: 21 mmol/L — ABNORMAL LOW (ref 22–32)
Calcium: 9.4 mg/dL (ref 8.9–10.3)
Chloride: 99 mmol/L (ref 98–111)
Creatinine, Ser: 0.94 mg/dL (ref 0.61–1.24)
GFR, Estimated: >60 mL/min (ref >60)
Glucose, Bld: 104 mg/dL — ABNORMAL HIGH (ref 70–99)
Potassium: 3.9 mmol/L (ref 3.5–5.1)
Sodium: 136 mmol/L (ref 135–145)
Total Bilirubin: 1 mg/dL (ref 0.3–1.2)
Total Protein: 8.4 g/dL — ABNORMAL HIGH (ref 6.5–8.1)

## 2020-10-03 LAB — CBC WITH DIFFERENTIAL/PLATELET
Abs Immature Granulocytes: 0 10*3/uL (ref 0.00–0.07)
Basophils Absolute: 0 10*3/uL (ref 0.0–0.1)
Basophils Relative: 0 %
Eosinophils Absolute: 0 10*3/uL (ref 0.0–0.5)
Eosinophils Relative: 1 %
HCT: 38.4 % — ABNORMAL LOW (ref 39.0–52.0)
Hemoglobin: 14.1 g/dL (ref 13.0–17.0)
Immature Granulocytes: 0 %
Lymphocytes Relative: 36 %
Lymphs Abs: 1.2 10*3/uL (ref 0.7–4.0)
MCH: 32.7 pg (ref 26.0–34.0)
MCHC: 36.7 g/dL — ABNORMAL HIGH (ref 30.0–36.0)
MCV: 89.1 fL (ref 80.0–100.0)
Monocytes Absolute: 0.6 10*3/uL (ref 0.1–1.0)
Monocytes Relative: 18 %
Neutro Abs: 1.6 10*3/uL — ABNORMAL LOW (ref 1.7–7.7)
Neutrophils Relative %: 45 %
Platelets: 269 10*3/uL (ref 150–400)
RBC: 4.31 MIL/uL (ref 4.22–5.81)
RDW: 12.4 % (ref 11.5–15.5)
WBC: 3.5 10*3/uL — ABNORMAL LOW (ref 4.0–10.5)
nRBC: 0 % (ref 0.0–0.2)

## 2020-10-03 MED ORDER — SODIUM CHLORIDE 0.9 % IV SOLN
375.0000 mg/m2 | Freq: Once | INTRAVENOUS | Status: AC
  Administered 2020-10-03: 700 mg via INTRAVENOUS
  Filled 2020-10-03: qty 70

## 2020-10-03 MED ORDER — VINBLASTINE SULFATE CHEMO INJECTION 1 MG/ML
6.0000 mg/m2 | Freq: Once | INTRAVENOUS | Status: AC
  Administered 2020-10-03: 11.2 mg via INTRAVENOUS
  Filled 2020-10-03: qty 11.2

## 2020-10-03 MED ORDER — SODIUM CHLORIDE 0.9 % IV SOLN
150.0000 mg | Freq: Once | INTRAVENOUS | Status: AC
  Administered 2020-10-03: 150 mg via INTRAVENOUS
  Filled 2020-10-03: qty 150

## 2020-10-03 MED ORDER — DOXORUBICIN HCL CHEMO IV INJECTION 2 MG/ML
25.0000 mg/m2 | Freq: Once | INTRAVENOUS | Status: AC
Start: 2020-10-03 — End: 2020-10-03
  Administered 2020-10-03: 46 mg via INTRAVENOUS
  Filled 2020-10-03: qty 23

## 2020-10-03 MED ORDER — SODIUM CHLORIDE 0.9 % IV SOLN
10.0000 mg | Freq: Once | INTRAVENOUS | Status: AC
  Administered 2020-10-03: 10 mg via INTRAVENOUS
  Filled 2020-10-03: qty 10

## 2020-10-03 MED ORDER — HEPARIN SOD (PORK) LOCK FLUSH 100 UNIT/ML IV SOLN
INTRAVENOUS | Status: AC
  Filled 2020-10-03: qty 5

## 2020-10-03 MED ORDER — HEPARIN SOD (PORK) LOCK FLUSH 100 UNIT/ML IV SOLN
500.0000 [IU] | Freq: Once | INTRAVENOUS | Status: AC | PRN
  Administered 2020-10-03: 500 [IU]
  Filled 2020-10-03: qty 5

## 2020-10-03 MED ORDER — SODIUM CHLORIDE 0.9 % IV SOLN
Freq: Once | INTRAVENOUS | Status: AC
  Filled 2020-10-03: qty 250

## 2020-10-03 MED ORDER — PALONOSETRON HCL INJECTION 0.25 MG/5ML
0.2500 mg | Freq: Once | INTRAVENOUS | Status: AC
  Administered 2020-10-03: 0.25 mg via INTRAVENOUS
  Filled 2020-10-03: qty 5

## 2020-10-03 NOTE — Progress Notes (Signed)
Hematology/Oncology follow up note [facility] Telephone:([phone] [phone]   Patient Care Team: Patient, No Pcp Per as PCP - General (General Practice)  REFERRING PROVIDER: No ref. provider found  CHIEF COMPLAINTS/REASON FOR VISIT:  Follow up for hodgkin's lymphoma.   HISTORY OF PRESENTING ILLNESS:   Preston [name] is a  35 y.o.  male with PMH listed below was seen in consultation at the request of  No ref. provider found  for evaluation of Hodgkin's lymphoma.  Patient has a known diagnosis of Hodgkin's lymphoma and wants to transfer his oncology care to our cancer center.  Extensive medical records reviewed was performed  [facility]  10/28/2019 biopsy of right chest wall mass, fibromuscular soft tissue with atypical infiltrate consistent with Hodgkin lymphoma, classical type. There were scattered single atypical cells with morphology suggestive of Jaclynn Guarneri variant cells. These cells were reactive with CD 45, CD 15, CD 30 and nonreactive for pan keratin, cam 5.7, CD3, CD5, CD10, CD 20, melan-A, Sox 10. Ki-67 stained majority of Reed-Sternberg like cells.  Noticed right upper chest wall lump in January 2021. He was incarcerated for 27 months, released in May 2021.  Unintentional weight loss about 30 pounds, drenching night sweats.  01/17/2020 PET scan showed bulky anterior mediastinal mass 12.7cm. with extensive adenopathy in neck, right axilla, mediastinum, bilateral hilar regions, upper abdominal ligament adenopathy and retroperitoneal adenopathy. Abnormal hypermetabolic activity within a splenic focus. Mild patchy ground glass opacity in the right lower lobe may reflect postobstructive inflammatory change without hypermetabolic activity.   01/17/2020 Echo 1. Normal biventricular systolic function and size. LVEF 60%. Averageglobal longitudinal strain is normal, -20%  2. No significant  valvular dysfunction.  3. Normal right-and left-sided filling pressures. 4. No prior study for comparison.  12/30/2019 LDH 588 01/24/2020 CRP 67.2, ESR 60  Stage IIIB Hodgkin's lymphoma, he was recommended ABVD x 2 cycles, followed by PET with plan of AVD x 4 or escalate to BEACOPP if not favorable results.   01/26/2020 ABVD Day 1,15 x 2 cycles.   03/24/2020 PET scan showed significant interval response to therapy. All PET positive lesions previously resolved 03/30/2020 AVD D1 04/13/2020 AVD D15  He was not able to have chest medi port placed due to chest mass. He has central line placed on left upper extremity. #Central line access Patient has seen Dr. Lucky Cowboy for evaluation of the left upper extremity central line.  I discussed with Dr. Lucky Cowboy and tip is in good position in the SVC.  Okay to use from vascular surgeon's aspect.  [facility] pathology consultation on biopsy specimen from 10/28/2019 showed findings compatible with classic Hodgkin's lymphoma.  # Last AVD chemotherapy was given on 07/10/2020.  He no showed for his chemotherapy appointment in mid December 2021.  He was seen by me on 08/07/2020 at that time he reports acute onset of chest pain, left shoulder, back and left arm. He was sent to emergency room to have a CT scan done to rule out acute pulmonary embolism 08/07/2020, CT chest angiogram showed no pulmonary emboli or acute chest vascular pathology. Superior mediastinal lymphadenopathy consistent with clinical history of lymphoma.  Largest node measures 8.3 x 8.3 x 3.8 cm.  Several other smaller but pathologic lymph nodes in the anterior mediastinum, second largest at the aortopulmonary window measuring 1.3 x 1.9 x 3.1 cm.  No pulmonary mass or nodule.  Patient also had negative troponin, Patient was discharged home for  further follow-up with cancer center.  ER physician provided a prescription of Percocet 5/325 mg 1 tablet every 4 hours as needed for severe pain.  I obtained additional  work-up for work-up to see if there is lymphoma progression.  08/23/2020 PET scan showed dominant right paramidline anterior mediastinal mass lesion is stable in size comparing to her outside PET scan on 03/24/2020.  Lesion showed low-level FDG uptake [place] 3, not appreciably changed in the interval.  Mildly enlarged hepatoduodenal ligament lymph node measures minimally smaller on CT imaging today with [place] 2 uptake.  No additional area of unexpected or suspicious hypermetabolic zone.  No other findings of lymphadenopathy on noncontrast CT image today.  Patient was recommended to proceed with 2D echocardiogram and he had Echo done on 09/08/2020. The delay was due to him not showing up to appointment.    INTERVAL HISTORY Preston [name] is a 35 y.o. male who has above history reviewed by me today presents for follow up visit for management of Hodgkin's  Lymphoma Patient is not compliant with his chemotherapy appointments.  Also no showed to his MRI cervical spine appointment. He was seen by palliative care service [facility].  He is taking Celebrex with he did not experience significant improvement. He reports the pain today is 6 out of 10. Appetite is fair.  He has lost 5 pounds since last visit.   Review of Systems  Constitutional: Negative for appetite change, chills, fatigue, fever and unexpected weight change.  HENT:   Negative for hearing loss and voice change.   Eyes: Negative for eye problems and icterus.  Respiratory: Negative for chest tightness, cough and shortness of breath.   Cardiovascular: Negative for leg swelling.       Chest wall pain  Gastrointestinal: Negative for abdominal distention and abdominal pain.  Endocrine: Negative for hot flashes.  Genitourinary: Negative for difficulty urinating, dysuria and frequency.   Musculoskeletal: Positive for back pain. Negative for arthralgias and neck pain.       Left shoulder pain  Skin: Negative for itching and rash.   Neurological: Negative for light-headedness and numbness.  Hematological: Negative for adenopathy. Does not bruise/bleed easily.  Psychiatric/Behavioral: Negative for confusion.    MEDICAL HISTORY:  Past Medical History:  Diagnosis Date  . Hodgkin's lymphoma ([place])   . Tobacco use     SURGICAL HISTORY: History reviewed. No pertinent surgical history.  SOCIAL HISTORY: Social History   Socioeconomic History  . Marital status: Single    Spouse name: Not on file  . Number of children: Not on file  . Years of education: Not on file  . Highest education level: Not on file  Occupational History  . Not on file  Tobacco Use  . Smoking status: Current Every Day Smoker    Packs/day: 0.50    Years: 20.00    Pack years: 10.00  . Smokeless tobacco: Never Used  Vaping Use  . Vaping Use: Never used  Substance and Sexual Activity  . Alcohol use: Yes  . Drug use: Not Currently  . Sexual activity: Not on file  Other Topics Concern  . Not on file  Social History Narrative  . Not on file   Social Determinants of Health   Financial Resource Strain: Not on file  Food Insecurity: Not on file  Transportation Needs: Not on file  Physical Activity: Not on file  Stress: Not on file  Social Connections: Not on file  Intimate Partner Violence: Not on file  FAMILY HISTORY: Family History  Problem Relation Age of Onset  . Heart failure Mother   . Pneumonia Father     ALLERGIES:  has No Known Allergies.  MEDICATIONS:  Current Outpatient Medications  Medication Sig Dispense Refill  . celecoxib (CELEBREX) 100 MG capsule Take 1 capsule (100 mg total) by mouth 2 (two) times daily. 60 capsule 0  . lidocaine-prilocaine (EMLA) cream Apply 1 application topically as needed. 30 g 1  . omeprazole (PRILOSEC) 20 MG capsule Take 1 capsule (20 mg total) by mouth daily. 30 capsule 0  . ondansetron (ZOFRAN) 8 MG tablet Take 1 tablet (8 mg total) by mouth 2 (two) times daily. 60 tablet 1  .  prochlorperazine (COMPAZINE) 10 MG tablet Take 1 tablet (10 mg total) by mouth every 6 (six) hours as needed for nausea or vomiting. 60 tablet 1  . oxyCODONE-acetaminophen (PERCOCET) 5-325 MG tablet Take 1 tablet by mouth every 4 (four) hours as needed for severe pain. (Patient not taking: No sig reported) 20 tablet 0   No current facility-administered medications for this visit.   Facility-Administered Medications Ordered in Other Visits  Medication Dose Route Frequency Provider Last Rate Last Admin  . heparin lock flush 100 unit/mL  500 Units Intravenous Once Earlie Server, MD      . sodium chloride flush (NS) 0.9 % injection 10 mL  10 mL Intravenous PRN Earlie Server, MD   10 mL at 08/07/20 0826     PHYSICAL EXAMINATION: ECOG PERFORMANCE STATUS: 1 - Symptomatic but completely ambulatory Vitals:   10/03/20 0913  BP: 130/89  Pulse: 99  Resp: 18  Temp: 98.3 F (36.8 C)   Filed Weights   10/03/20 0913  Weight: 170 lb (77.1 kg)    Physical Exam Constitutional:      General: He is not in acute distress. HENT:     Head: Normocephalic and atraumatic.  Eyes:     General: No scleral icterus. Cardiovascular:     Rate and Rhythm: Normal rate and regular rhythm.     Heart sounds: Normal heart sounds.  Pulmonary:     Effort: Pulmonary effort is normal. No respiratory distress.     Breath sounds: No wheezing.  Abdominal:     General: Bowel sounds are normal. There is no distension.     Palpations: Abdomen is soft.  Musculoskeletal:        General: No deformity. Normal range of motion.     Cervical back: Normal range of motion and neck supple.  Skin:    General: Skin is warm and dry.     Findings: No erythema or rash.  Neurological:     Mental Status: He is alert and oriented to person, place, and time. Mental status is at baseline.     Cranial Nerves: No cranial nerve deficit.     Coordination: Coordination normal.  Psychiatric:        Mood and Affect: Mood normal.   left upper arm  central line access     LABORATORY DATA:  I have reviewed the data as listed Lab Results  Component Value Date   WBC 3.5 (L) 10/03/2020   HGB 14.1 10/03/2020   HCT 38.4 (L) 10/03/2020   MCV 89.1 10/03/2020   PLT 269 10/03/2020   Recent Labs    09/01/20 0843 09/15/20 0810 10/03/20 0857  NA 136 138 136  K 4.0 3.7 3.9  CL 103 102 99  CO2 27 23 21*  GLUCOSE 99 118* 104*  BUN '9 14 10  '$ CREATININE 0.69 0.81 0.94  CALCIUM 8.9 9.3 9.4  GFRNONAA >60 >60 >60  PROT 7.2 8.0 8.4*  ALBUMIN 4.2 4.3 4.7  AST 23 22 33  ALT $Re'20 16 25  'DRm$ ALKPHOS 44 44 55  BILITOT 0.6 1.0 1.0   Iron/TIBC/Ferritin/ %Sat No results found for: IRON, TIBC, FERRITIN, IRONPCTSAT    RADIOGRAPHIC STUDIES: I have personally reviewed the radiological images as listed and agreed with the findings in the report. ECHOCARDIOGRAM LIMITED  Result Date: 09/08/2020    ECHOCARDIOGRAM LIMITED REPORT   Patient Name:   Preston [name] Date of Exam: 09/08/2020 Medical Rec #:  [id number]           Height:       70.0 in Accession #:    [id number]          Weight:       175.5 lb Date of Birth:  Nov 26, 1985            BSA:          1.975 m Patient Age:    37 years            BP:           136/97 mmHg Patient Gender: M                   HR:           82 bpm. Exam Location:  [facility] Procedure: 2D Echo, Color Doppler, Cardiac Doppler and Strain Analysis Indications:     v58.11 Chemotherapy evaluation  History:         Patient has no prior history of Echocardiogram examinations.                  Risk Factors:Current Smoker.  Sonographer:     Charmayne Sheer RDCS (AE) Referring Phys:  [id number] Argelio Granier Diagnosing Phys: Ida Rogue MD  Sonographer Comments: Global longitudinal strain was attempted. IMPRESSIONS  1. Left ventricular ejection fraction, by estimation, is 50 to 55%. Left ventricular ejection fraction by PLAX is 51 %. The left ventricle has low normal function. The left ventricle has no regional wall motion abnormalities. Left ventricular  diastolic parameters were normal. The average left ventricular global longitudinal strain is -16.1 %.  2. Right ventricular systolic function is normal. The right ventricular size is normal. Tricuspid regurgitation signal is inadequate for assessing PA pressure.  3. FINDINGS  Left Ventricle: Left ventricular ejection fraction, by estimation, is 50 to 55%. Left ventricular ejection fraction by PLAX is 51 %. The left ventricle has low normal function. The left ventricle has no regional wall motion abnormalities. The average left ventricular global longitudinal strain is -16.1 %. The left ventricular internal cavity size was normal in size. Left ventricular diastolic parameters were normal. Right Ventricle: The right ventricular size is normal. No increase in right ventricular wall thickness. Right ventricular systolic function is normal. Tricuspid regurgitation signal is inadequate for assessing PA pressure. Left Atrium: Left atrial size was normal in size. Right Atrium: Right atrial size was normal in size. Pericardium: There is no evidence of pericardial effusion. Mitral Valve: The mitral valve is normal in structure. Trivial mitral valve regurgitation. MV peak gradient, 4.8 mmHg. The mean mitral valve gradient is 2.0 mmHg. Tricuspid Valve: The tricuspid valve is normal in structure. Tricuspid valve regurgitation is mild. Aortic Valve: The aortic valve is normal in structure. Aortic valve regurgitation is not visualized. No aortic stenosis is present. Aortic valve  mean gradient measures 5.0 mmHg. Aortic valve peak gradient measures 9.1 mmHg. Aortic valve area, by VTI measures 2.13 cm. Pulmonic Valve: The pulmonic valve was normal in structure. Pulmonic valve regurgitation is trivial. Aorta: The aortic root and ascending aorta are structurally normal, with no evidence of dilitation. Venous: The pulmonary veins were not well visualized. IAS/Shunts: No atrial level shunt detected by color flow Doppler. LEFT VENTRICLE  PLAX 2D LV EF:         Left            Diastology                ventricular     LV e' medial:    8.16 cm/s                ejection        LV E/e' medial:  11.2                fraction by     LV e' lateral:   9.68 cm/s                PLAX is 51      LV E/e' lateral: 9.5                %. LVIDd:         5.00 cm         2D LVIDs:         3.70 cm         Longitudinal LV PW:         1.00 cm         Strain LV IVS:        0.70 cm         2D Strain GLS  -18.3 % LVOT diam:     1.90 cm         (A2C): LV SV:         62              2D Strain GLS  -14.4 % LV SV Index:   32              (A3C): LVOT Area:     2.84 cm        2D Strain GLS  -15.6 %                                (A4C):                                2D Strain GLS  -16.1 %                                Avg: RIGHT VENTRICLE RV Basal diam:  2.70 cm RV S prime:     13.30 cm/s TAPSE (M-mode): 2.4 cm LEFT ATRIUM           Index       RIGHT ATRIUM           Index LA diam:      2.80 cm 1.42 cm/m  RA Area:     10.60 cm LA Vol (A2C): 39.6 ml 20.06 ml/m RA Volume:   22.50 ml  11.39 ml/m LA Vol (A4C): 40.1 ml 20.31 ml/m  AORTIC VALVE  PULMONIC VALVE AV Area (Vmax):    2.20 cm     PV Vmax:       0.78 m/s AV Area (Vmean):   2.19 cm     PV Vmean:      59.000 cm/s AV Area (VTI):     2.13 cm     PV VTI:        0.159 m AV Vmax:           151.00 cm/s  PV Peak grad:  2.5 mmHg AV Vmean:          112.000 cm/s PV Mean grad:  1.0 mmHg AV VTI:            0.293 m AV Peak Grad:      9.1 mmHg AV Mean Grad:      5.0 mmHg LVOT Vmax:         117.00 cm/s LVOT Vmean:        86.600 cm/s LVOT VTI:          0.220 m LVOT/AV VTI ratio: 0.75  AORTA Ao Root diam: 2.80 cm MITRAL VALVE MV Area (PHT): 5.79 cm    SHUNTS MV Area VTI:   2.60 cm    Systemic VTI:  0.22 m MV Peak grad:  4.8 mmHg    Systemic Diam: 1.90 cm MV Mean grad:  2.0 mmHg MV Vmax:       1.09 m/s MV Vmean:      71.4 cm/s MV Decel Time: 131 msec MV E velocity: 91.70 cm/s MV A velocity: 67.30 cm/s MV E/A ratio:   1.36 Ida Rogue MD Electronically signed by Ida Rogue MD Signature Date/Time: 09/08/2020/11:00:45 AM    Final       ASSESSMENT & PLAN:  1. Encounter for antineoplastic chemotherapy   2. 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 Other chest pain   4. Pain in joint of left shoulder   5. Dyspnea, unspecified type    # Hodgkin's lymphoma, classic type Per previous Oncologist note, S/p 2 cycles of ABVD with favorable PET outcome ([place] 3), and then 1 cycle of AVD.  Patient switched care after relocating to [facility]. Received cycle 4-day 1 AVD on 06/26/2020, day 15 AVD on 07/10/2020. Cycle 5 day 1 was delayed due to patient no-show to his appointments. PET scan images were independently reviewed by me and discussed with patient. Comparing to his PET scan done in outside facility in August 2021, there is no significant interval changes of the size and hypermetabolic activity,[[place] 3].  Labs are reviewed and discussed with patient.  Proceed with cycle 6-day 1 AVD today.  #09/08/2018  2D echocardiogram showed low normal LVEF 50% to 55%.  This is slightly decreased from his outside echocardiogram in June 2021 with LVEF of 60%. Proceed with cycle 6 AVD Recommend patient to establish care with cardiology for further evaluation and monitoring.  #Cough and shortness of breath with exertion.  Symptom appears to have improved.  #Left chest/back/shoulder/arm pain Unknown etiology. This pain is acute onset since end of December 2021.  He did not have pain when he establish care with me in October 2021, and the PET scan appears to be similar comparing to PET scan in August 2021.  No significant disease progression.  Less likely due to the residual tumor.  Continue Celebrex.   All questions were answered. The patient knows to call the clinic with any problems questions or concerns.  Return of visit:  2 weeks for evaluation prior to cycle 6-day 15  chemotherapy. Earlie Server, MD, PhD Hematology Oncology [facility] Pager- [phone] 10/03/2020

## 2020-10-03 NOTE — Progress Notes (Signed)
Patient here for follow up and tx. Pt reports that pain to left shoulder/ chest area is getting better and pain is 5/10 today.

## 2020-10-15 ENCOUNTER — Ambulatory Visit: Admission: RE | Admit: 2020-10-15 | Payer: Self-pay | Source: Ambulatory Visit

## 2020-10-17 ENCOUNTER — Inpatient Hospital Stay
Admission: EM | Admit: 2020-10-17 | Discharge: 2020-10-21 | DRG: 472 | Disposition: A | Discharge status: Home / Self Care | Payer: Self-pay | Attending: Internal Medicine | Admitting: Internal Medicine

## 2020-10-17 ENCOUNTER — Inpatient Hospital Stay: Payer: Self-pay

## 2020-10-17 ENCOUNTER — Emergency Department: Payer: Self-pay

## 2020-10-17 ENCOUNTER — Other Ambulatory Visit: Payer: Self-pay

## 2020-10-17 ENCOUNTER — Inpatient Hospital Stay: Payer: Self-pay | Admitting: Oncology

## 2020-10-17 DIAGNOSIS — C8192 Hodgkin lymphoma, unspecified, intrathoracic lymph nodes: Secondary | ICD-10-CM | POA: Diagnosis present

## 2020-10-17 DIAGNOSIS — R202 Paresthesia of skin: Secondary | ICD-10-CM | POA: Diagnosis present

## 2020-10-17 DIAGNOSIS — S40811A Abrasion of right upper arm, initial encounter: Secondary | ICD-10-CM | POA: Diagnosis present

## 2020-10-17 DIAGNOSIS — Z20822 Contact with and (suspected) exposure to covid-19: Secondary | ICD-10-CM | POA: Diagnosis present

## 2020-10-17 DIAGNOSIS — Z23 Encounter for immunization: Secondary | ICD-10-CM

## 2020-10-17 DIAGNOSIS — R29898 Other symptoms and signs involving the musculoskeletal system: Secondary | ICD-10-CM

## 2020-10-17 DIAGNOSIS — Z5111 Encounter for antineoplastic chemotherapy: Secondary | ICD-10-CM

## 2020-10-17 DIAGNOSIS — Y907 Blood alcohol level of 200-239 mg/100 ml: Secondary | ICD-10-CM | POA: Diagnosis present

## 2020-10-17 DIAGNOSIS — R780 Finding of alcohol in blood: Secondary | ICD-10-CM

## 2020-10-17 DIAGNOSIS — Z9221 Personal history of antineoplastic chemotherapy: Secondary | ICD-10-CM

## 2020-10-17 DIAGNOSIS — R053 Chronic cough: Secondary | ICD-10-CM | POA: Diagnosis present

## 2020-10-17 DIAGNOSIS — M4802 Spinal stenosis, cervical region: Secondary | ICD-10-CM | POA: Diagnosis present

## 2020-10-17 DIAGNOSIS — M50022 Cervical disc disorder at C5-C6 level with myelopathy: Principal | ICD-10-CM | POA: Diagnosis present

## 2020-10-17 DIAGNOSIS — F10129 Alcohol abuse with intoxication, unspecified: Secondary | ICD-10-CM | POA: Diagnosis present

## 2020-10-17 DIAGNOSIS — C8170 Other classical Hodgkin lymphoma, unspecified site: Secondary | ICD-10-CM

## 2020-10-17 DIAGNOSIS — R200 Anesthesia of skin: Secondary | ICD-10-CM | POA: Diagnosis present

## 2020-10-17 DIAGNOSIS — Y908 Blood alcohol level of 240 mg/100 ml or more: Secondary | ICD-10-CM

## 2020-10-17 DIAGNOSIS — G9520 Unspecified cord compression: Secondary | ICD-10-CM

## 2020-10-17 DIAGNOSIS — Z8249 Family history of ischemic heart disease and other diseases of the circulatory system: Secondary | ICD-10-CM

## 2020-10-17 DIAGNOSIS — E860 Dehydration: Secondary | ICD-10-CM | POA: Diagnosis present

## 2020-10-17 DIAGNOSIS — D72819 Decreased white blood cell count, unspecified: Secondary | ICD-10-CM | POA: Diagnosis present

## 2020-10-17 DIAGNOSIS — C8190 Hodgkin lymphoma, unspecified, unspecified site: Secondary | ICD-10-CM | POA: Diagnosis present

## 2020-10-17 DIAGNOSIS — S00411A Abrasion of right ear, initial encounter: Secondary | ICD-10-CM | POA: Diagnosis present

## 2020-10-17 DIAGNOSIS — S40812A Abrasion of left upper arm, initial encounter: Secondary | ICD-10-CM | POA: Diagnosis present

## 2020-10-17 DIAGNOSIS — M5020 Other cervical disc displacement, unspecified cervical region: Secondary | ICD-10-CM

## 2020-10-17 DIAGNOSIS — F17210 Nicotine dependence, cigarettes, uncomplicated: Secondary | ICD-10-CM | POA: Diagnosis present

## 2020-10-17 DIAGNOSIS — W548XXA Other contact with dog, initial encounter: Secondary | ICD-10-CM

## 2020-10-17 DIAGNOSIS — T07XXXA Unspecified multiple injuries, initial encounter: Secondary | ICD-10-CM

## 2020-10-17 DIAGNOSIS — Z419 Encounter for procedure for purposes other than remedying health state, unspecified: Secondary | ICD-10-CM

## 2020-10-17 DIAGNOSIS — K644 Residual hemorrhoidal skin tags: Secondary | ICD-10-CM | POA: Diagnosis present

## 2020-10-17 DIAGNOSIS — M4322 Fusion of spine, cervical region: Secondary | ICD-10-CM

## 2020-10-17 DIAGNOSIS — R531 Weakness: Secondary | ICD-10-CM

## 2020-10-17 LAB — CBC WITH DIFFERENTIAL/PLATELET
Abs Immature Granulocytes: 0.02 10*3/uL (ref 0.00–0.07)
Basophils Absolute: 0 10*3/uL (ref 0.0–0.1)
Basophils Relative: 1 %
Eosinophils Absolute: 0 10*3/uL (ref 0.0–0.5)
Eosinophils Relative: 0 %
HCT: 40 % (ref 39.0–52.0)
Hemoglobin: 14.6 g/dL (ref 13.0–17.0)
Immature Granulocytes: 1 %
Lymphocytes Relative: 39 %
Lymphs Abs: 1.4 10*3/uL (ref 0.7–4.0)
MCH: 32 pg (ref 26.0–34.0)
MCHC: 36.5 g/dL — ABNORMAL HIGH (ref 30.0–36.0)
MCV: 87.7 fL (ref 80.0–100.0)
Monocytes Absolute: 0.7 10*3/uL (ref 0.1–1.0)
Monocytes Relative: 19 %
Neutro Abs: 1.4 10*3/uL — ABNORMAL LOW (ref 1.7–7.7)
Neutrophils Relative %: 40 %
Platelets: 237 10*3/uL (ref 150–400)
RBC: 4.56 MIL/uL (ref 4.22–5.81)
RDW: 12.6 % (ref 11.5–15.5)
WBC: 3.6 10*3/uL — ABNORMAL LOW (ref 4.0–10.5)
nRBC: 0 % (ref 0.0–0.2)

## 2020-10-17 LAB — PROTIME-INR
INR: 1 (ref 0.8–1.2)
Prothrombin Time: 12.3 seconds (ref 11.4–15.2)

## 2020-10-17 IMAGING — CT CT HEAD W/O CM
4 series · 17 of 47 positions shown, 19 images · non-contrast
Comparison: [DATE]

CLINICAL DATA: Neurologic deficit, lymphoma currently undergoing
chemotherapy, bilateral hand and foot weakness

EXAM:
CT HEAD WITHOUT CONTRAST
TECHNIQUE: Contiguous axial images were obtained from the base of the skull
through the vertex without intravenous contrast.

[Series 2: head bone · axial · 0.43mm/px · z∈[-117,-61]mm · 4 of 80 slices shown]
[im 8/80  bone]
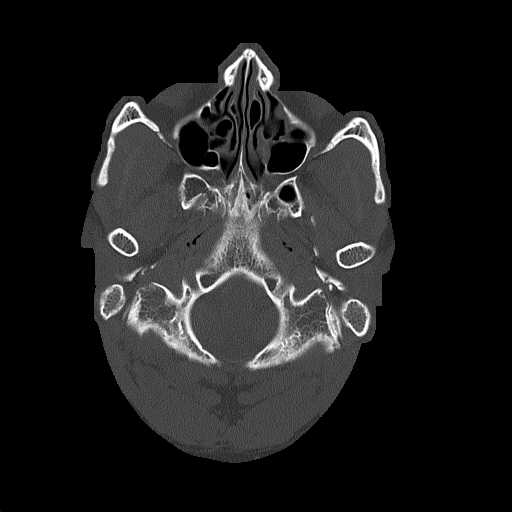
[im 16/80  bone]
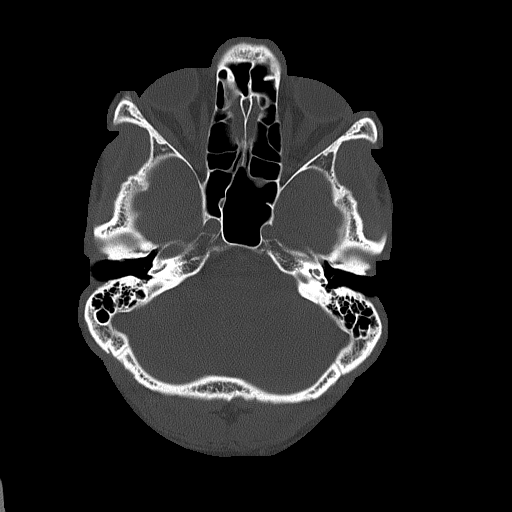
[im 24/80  bone]
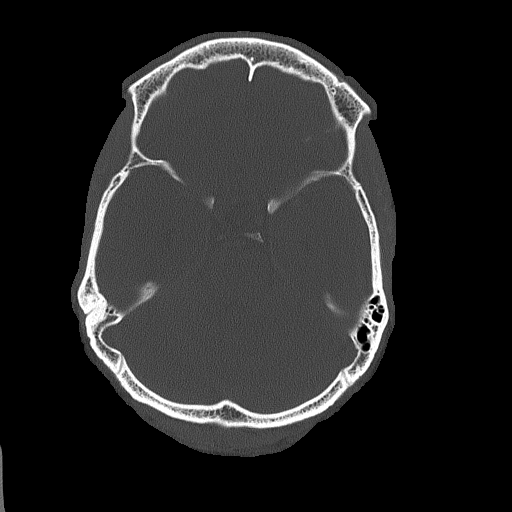
[im 36/80  bone]
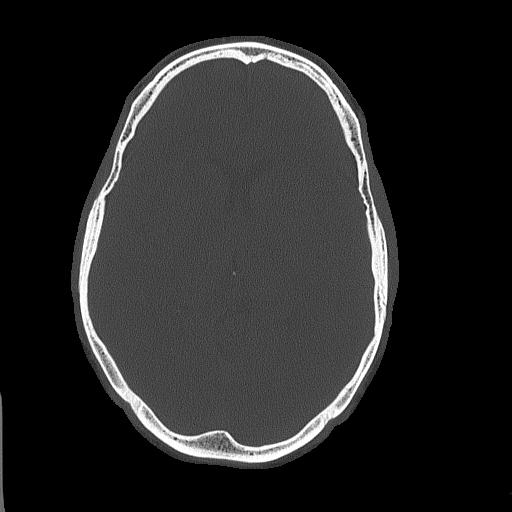

[Series 3: head wo · axial · 0.43mm/px · z∈[-116,+4]mm · 7 of 32 slices shown, 9 images]
[im 4/32  brain]
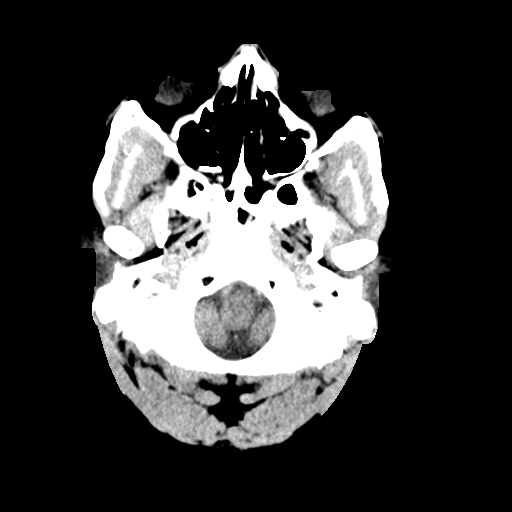
[im 4/32  bone]
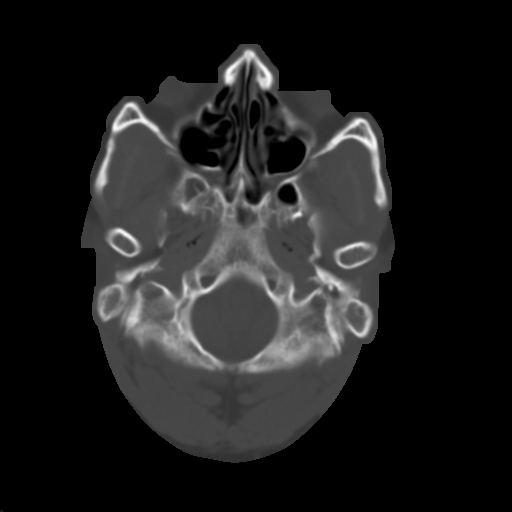
[im 8/32  brain]
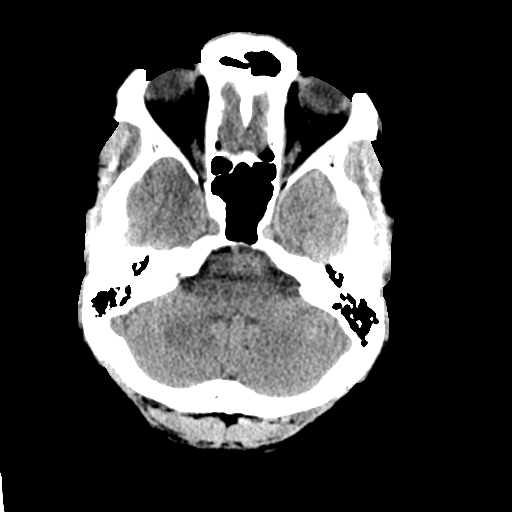
[im 12/32  brain]
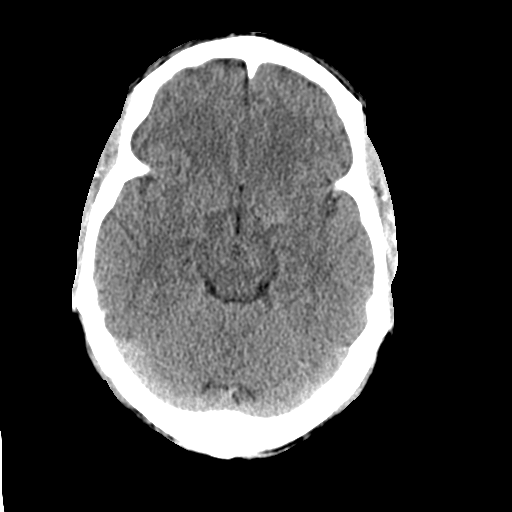
[im 16/32  brain]
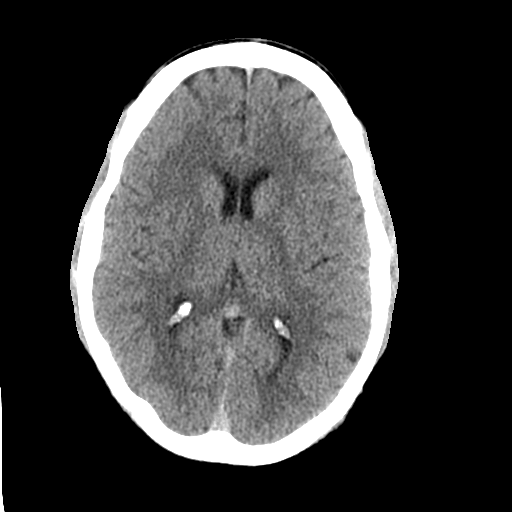
[im 20/32  brain]
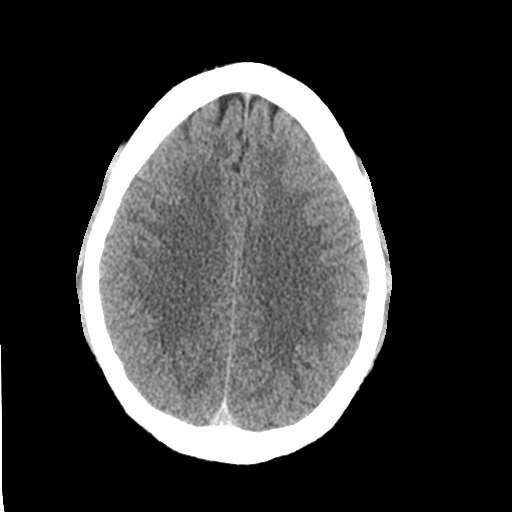
[im 20/32  bone]
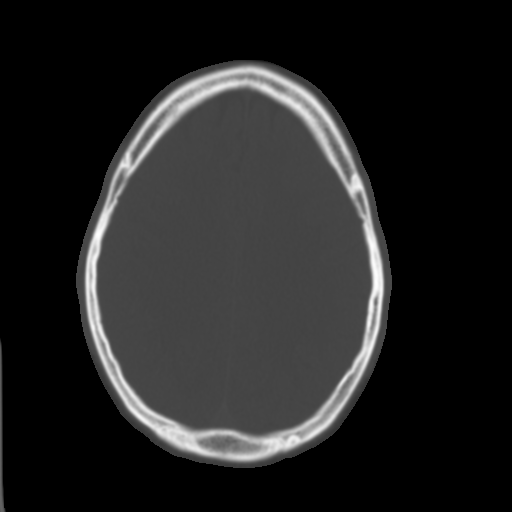
[im 24/32  brain]
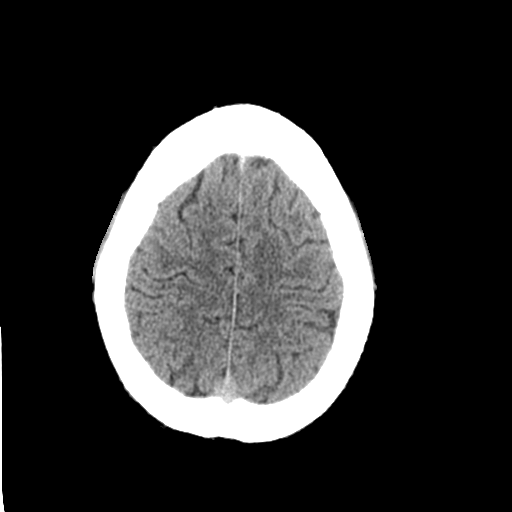
[im 28/32  brain]
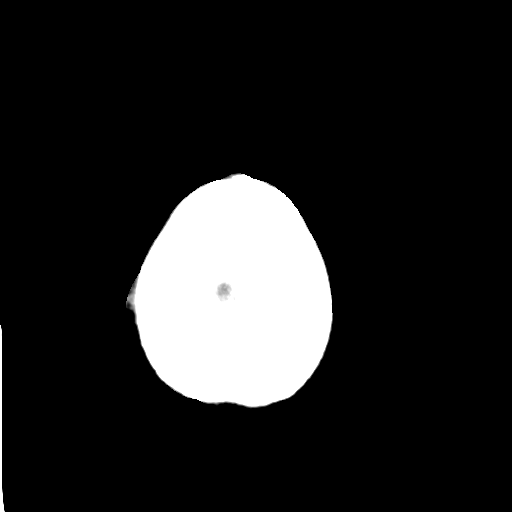

[Series 4: coronal soft tissue · coronal · 0.32mm/px · 3 of 68 slices shown]
[im 23/68  brain]
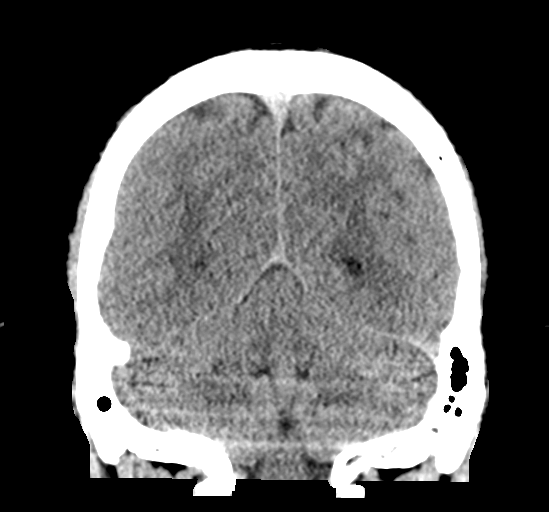
[im 30/68  brain]
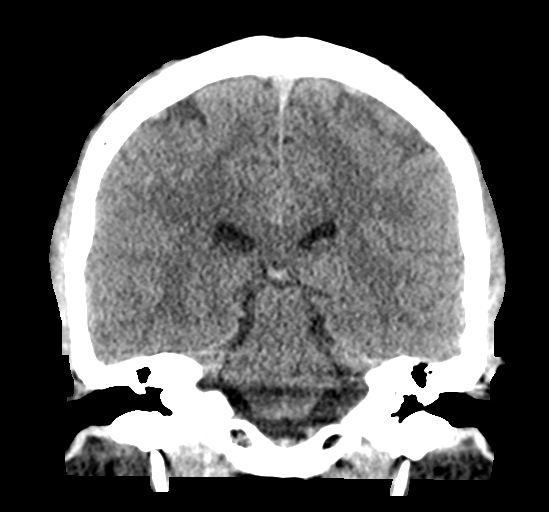
[im 38/68  brain]
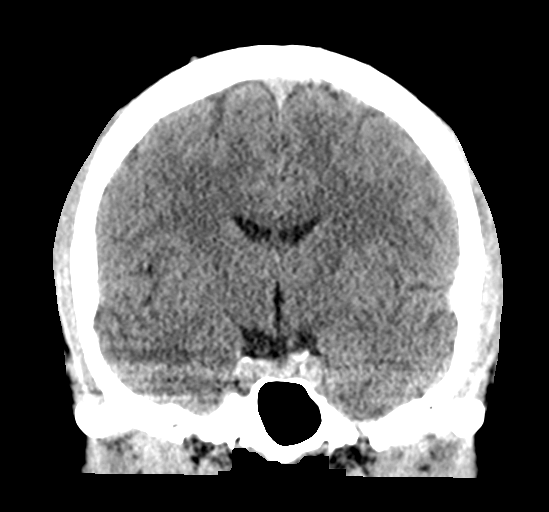

[Series 5: sagittal soft tissue · sagittal · 0.32mm/px · 3 of 59 slices shown]
[im 20/59  brain]
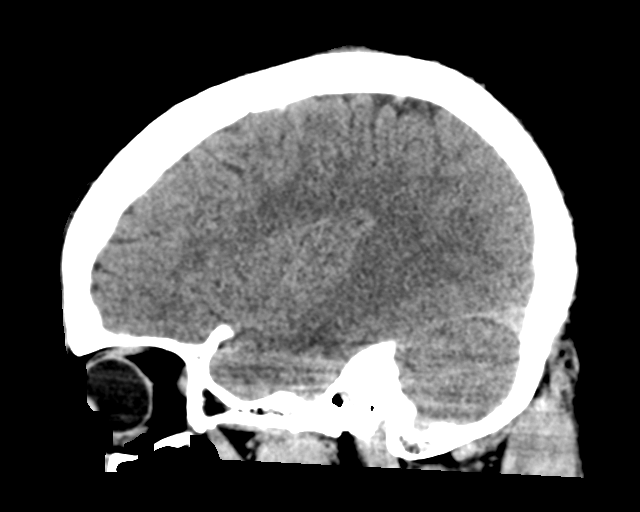
[im 30/59  brain]
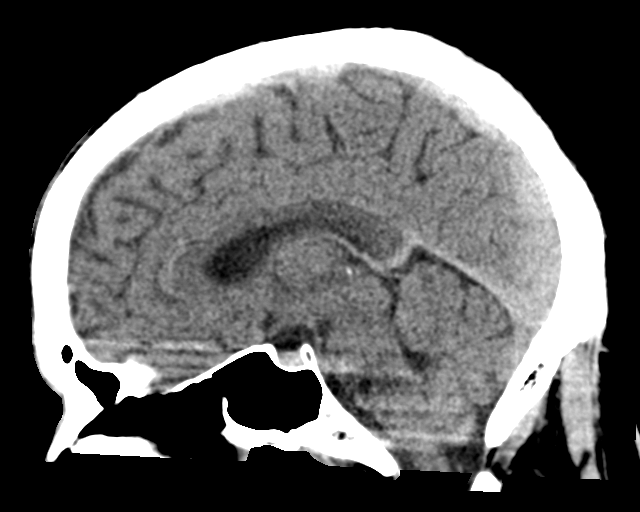
[im 39/59  brain]
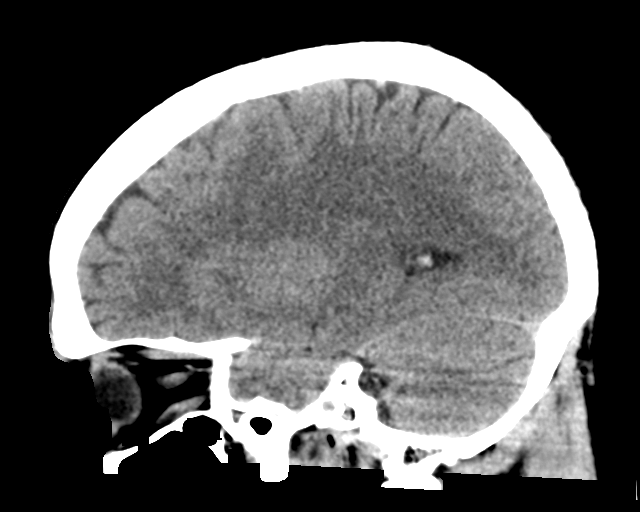

[17 of 47 positions shown; findings below may reference images not displayed]

FINDINGS: Brain: No acute infarct or hemorrhage. Lateral ventricles and
midline structures are unremarkable. No acute extra-axial fluid
collections. No mass effect.

Vascular: No hyperdense vessel or unexpected calcification.

Skull: Normal. Negative for fracture or focal lesion.

Sinuses/Orbits: No acute finding.

Other: None.
IMPRESSION: 1. No acute intracranial process.

## 2020-10-17 NOTE — ED Provider Notes (Addendum)
[facility] Emergency Department Provider Note  ____________________________________________   Event Date/Time   First MD Initiated Contact with Patient 10/17/20 2322     (approximate)  I have reviewed the triage vital signs and the nursing notes.   HISTORY  Chief Complaint Weakness   HPI Preston [name] is a 35 y.o. male with a past medical history of Hodgkin's lymphoma currently undergoing outpatient chemotherapy.  Tobacco abuse who presents via EMS from home for assessment of acute weakness in his right arm and right leg in the setting of some subacute weakness in his left arm and left leg that has been ongoing for at least a month although patient is not exactly sure when he first noticed weakness or numbness in his left arm and left leg.  First patient stated his weakness began an hour and half prior to arrival although it seems this is actually been slowly progressing over couple days.  He does note that he was able to ambulate prior to the onset of the weakness on the right side of his body although he has been having worsening weakness in the left side of the least about a month.  He endorses intermittent nonbloody nonbilious nausea and vomiting over last couple of weeks which he attributes to his chemotherapy.  States electric chemotherapy 2 weeks ago was supposed to get a session today but did not go because he was feeling bad.  Also Dors is some chronic bright red blood in his stools and notes he was previously told he had hemorrhoids and is not sure if this is from this.  He states this has been going on for at least several months and has not changed all recently.  Denies being on any blood thinners.  He endorses a chronic cough without any acute change he denies any acute chest pain, Donnell pain, back pain, urinary symptoms rash or recent falls or injuries.  States he has some chronic pain in his neck and left shoulder and left chest and has been going  on also for at least a month.  Denies any other acute concerns at this time.  Does endorse drinking unspecified mental alcohol earlier tonight although denies illegal drug use.         Past Medical History:  Diagnosis Date  . Hodgkin's lymphoma ([place])   . Tobacco use     Patient Active Problem List   Diagnosis Date Noted  . Other chest pain 09/15/2020  . Pain in joint of left shoulder 09/15/2020  . Dyspnea 09/15/2020  . Encounter for antineoplastic chemotherapy 07/10/2020  . Tobacco use 05/17/2020  . Goals of care, counseling/discussion 05/17/2020  . Malignant lymphoma, Hodgkin's type ([place]) 12/30/2019    History reviewed. No pertinent surgical history.  Prior to Admission medications   Medication Sig Start Date End Date Taking? Authorizing Provider  celecoxib (CELEBREX) 100 MG capsule Take 1 capsule (100 mg total) by mouth 2 (two) times daily. 09/15/20   Borders, Kirt Boys, NP  lidocaine-prilocaine (EMLA) cream Apply 1 application topically as needed. 06/26/20   Earlie Server, MD  omeprazole (PRILOSEC) 20 MG capsule Take 1 capsule (20 mg total) by mouth daily. 09/15/20   Borders, Kirt Boys, NP  ondansetron (ZOFRAN) 8 MG tablet Take 1 tablet (8 mg total) by mouth 2 (two) times daily. 06/26/20   Earlie Server, MD  oxyCODONE-acetaminophen (PERCOCET) 5-325 MG tablet Take 1 tablet by mouth every 4 (four) hours as needed for severe pain. Patient not taking: No sig  reported 08/07/20 08/07/21  Blake Divine, MD  prochlorperazine (COMPAZINE) 10 MG tablet Take 1 tablet (10 mg total) by mouth every 6 (six) hours as needed for nausea or vomiting. 06/26/20   Earlie Server, MD    Allergies Patient has no known allergies.  Family History  Problem Relation Age of Onset  . Heart failure Mother   . Pneumonia Father     Social History Social History   Tobacco Use  . Smoking status: Current Every Day Smoker    Packs/day: 0.50    Years: 20.00    Pack years: 10.00  . Smokeless tobacco: Never Used  Vaping  Use  . Vaping Use: Never used  Substance Use Topics  . Alcohol use: Yes    Comment: occasional   . Drug use: Not Currently    Review of Systems  Review of Systems  Constitutional: Positive for malaise/fatigue. Negative for chills and fever.  HENT: Negative for sore throat.   Eyes: Negative for pain.  Respiratory: Positive for cough ( chronic). Negative for stridor.   Cardiovascular: Negative for chest pain.  Gastrointestinal: Positive for blood in stool, nausea and vomiting.  Musculoskeletal: Positive for joint pain ( L shoulder) and neck pain ( at least over the past month ).  Skin: Negative for rash.  Neurological: Positive for sensory change, focal weakness and weakness. Negative for seizures, loss of consciousness and headaches.  Psychiatric/Behavioral: Negative for suicidal ideas.  All other systems reviewed and are negative.     ____________________________________________   PHYSICAL EXAM:  VITAL SIGNS: ED Triage Vitals [10/17/20 2321]  Enc Vitals Group     BP (!) 127/95     Pulse Rate 89     Resp 18     Temp 98.2 F (36.8 C)     Temp Source Oral     SpO2 97 %     Weight      Height      Head Circumference      Peak Flow      Pain Score      Pain Loc      Pain Edu?      Excl. in [place]?    Vitals:   10/18/20 0115 10/18/20 0423  BP:  131/81  Pulse: 88 84  Resp: 20 19  Temp:    SpO2: 97% 99%   Physical Exam Vitals and nursing note reviewed. Exam conducted with a chaperone present.  Constitutional:      Appearance: He is well-developed and well-nourished.  HENT:     Head: Normocephalic and atraumatic.     Right Ear: External ear normal.     Left Ear: External ear normal.     Nose: Nose normal.  Eyes:     Conjunctiva/sclera: Conjunctivae normal.  Cardiovascular:     Rate and Rhythm: Normal rate and regular rhythm.     Heart sounds: No murmur heard.   Pulmonary:     Effort: Pulmonary effort is normal. No respiratory distress.     Breath sounds:  Normal breath sounds.  Abdominal:     Palpations: Abdomen is soft.     Tenderness: There is no abdominal tenderness.  Genitourinary:    Rectum: External hemorrhoid present.  Musculoskeletal:        General: No edema.     Cervical back: Neck supple.  Skin:    General: Skin is warm and dry.     Capillary Refill: Capillary refill takes less than 2 seconds.  Neurological:  Mental Status: He is alert and oriented to person, place, and time.  Psychiatric:        Mood and Affect: Mood and affect and mood normal.        Speech: Speech is slurred.        Thought Content: Thought content does not include homicidal or suicidal ideation.     Cranial nerves II through X grossly intact.  2+ bilateral radial pulses.  No tenderness step-offs or deformities over the C/T/L-spine.  There is no effusion overlying skin changes or other deformity over the bilateral shoulders elbows wrists or knees.  Patient has significantly decreased grip strength in his left hand compared to his right but is weak in right hand grip strength as well as on extension and flexion of his digits and extension and flexion of his right elbow.  He also has some weakness in his right hip and right knee on flexion extension significantly more weakness on the left side.  While sensation is intact to light touch of all extremities with exception of the distal left upper extremity which patient states has going on for several weeks he does state it feels different than his left arm and throughout his right hemibody than it did yesterday.  He does have a less than 0.25 cm scratch over his superior right earlobe which he states is from his dog playing in his lap and actually scratching him earlier yesterday. ____________________________________________   LABS (all labs ordered are listed, but only abnormal results are displayed)  Labs Reviewed  CBC WITH DIFFERENTIAL/PLATELET - Abnormal; Notable for the following components:      Result  Value   WBC 3.6 (*)    MCHC 36.5 (*)    Neutro Abs 1.4 (*)    All other components within normal limits  COMPREHENSIVE METABOLIC PANEL - Abnormal; Notable for the following components:   CO2 21 (*)    Total Protein 8.6 (*)    Anion gap 16 (*)    All other components within normal limits  ETHANOL - Abnormal; Notable for the following components:   Alcohol, Ethyl (B) 255 (*)    All other components within normal limits  RESP PANEL BY RT-PCR (FLU A&B, COVID) ARPGX2  PROTIME-INR  MAGNESIUM  URINE DRUG SCREEN, QUALITATIVE ([facility] ONLY)   ____________________________________________  EKG  Sinus rhythm with a ventricular of 93, normal axis, unremarkable intervals and no clear evidence of acute ischemia or other significant arrhythmia. ____________________________________________  RADIOLOGY  ED MD interpretation: CT head is unremarkable.  Chest x-ray shows unchanged anterior mediastinal mass without new focal consolidation, effusion, edema or other acute intrathoracic process.  No abnormalities of MR brain or T or L spine but there is evidence of significant cervical disc herniation on MR C-spine with some cord edema.  Official radiology report(s): DG Chest 2 View  Result Date: 10/18/2020 CLINICAL DATA:  Left shoulder pain, generalized weakness, lymphoma EXAM: CHEST - 2 VIEW COMPARISON:  08/07/2020, CT 08/07/2020 FINDINGS: Lungs are clear. No pneumothorax or pleural effusion. Right paratracheal and suprahilar soft tissue thickening is unchanged, better appreciated on prior CT examination and related to the anterior mediastinal mass. Left upper extremity PICC line tip noted within the superior vena cava. Cardiac size within normal limits. Pulmonary vascularity is normal. No acute bone abnormality. IMPRESSION: Unchanged anterior mediastinal mass. No radiographic evidence of acute cardiopulmonary disease. Electronically Signed   By: Fidela Salisbury MD   On: 10/18/2020 00:42   CT Head Wo  Contrast  Result Date: 10/17/2020 CLINICAL DATA:  Neurologic deficit, lymphoma currently undergoing chemotherapy, bilateral hand and foot weakness EXAM: CT HEAD WITHOUT CONTRAST TECHNIQUE: Contiguous axial images were obtained from the base of the skull through the vertex without intravenous contrast. COMPARISON:  08/23/2020 FINDINGS: Brain: No acute infarct or hemorrhage. Lateral ventricles and midline structures are unremarkable. No acute extra-axial fluid collections. No mass effect. Vascular: No hyperdense vessel or unexpected calcification. Skull: Normal. Negative for fracture or focal lesion. Sinuses/Orbits: No acute finding. Other: None. IMPRESSION: 1. No acute intracranial process. Electronically Signed   By: Randa Ngo M.D.   On: 10/17/2020 23:56   MR Brain W and Wo Contrast  Result Date: 10/18/2020 CLINICAL DATA:  35 year old male with new onset weakness, loss of bilateral hand grip, weakness in both feet. History of stage III lymphoma with most recent chemotherapy 2 weeks ago. EXAM: MRI HEAD WITHOUT AND WITH CONTRAST TECHNIQUE: Multiplanar, multiecho pulse sequences of the brain and surrounding structures were obtained without and with intravenous contrast. CONTRAST:  35mL GADAVIST GADOBUTROL 1 MMOL/ML IV SOLN COMPARISON:  Cervical spine MRI today reported separately. Head CT yesterday. FINDINGS: Brain: Normal cerebral volume. No restricted diffusion to suggest acute infarction. No midline shift, mass effect, evidence of mass lesion, ventriculomegaly, extra-axial collection or acute intracranial hemorrhage. Cervicomedullary junction and pituitary are within normal limits. Axial FLAIR imaging degraded by motion despite repeated imaging attempts. Otherwise gray and white matter signal is within normal limits throughout the brain. No encephalomalacia or chronic cerebral blood products identified. No abnormal enhancement identified.  No dural thickening. Vascular: Major intracranial vascular flow voids  are preserved. The major dural venous sinuses are enhancing and appear to be patent. Skull and upper cervical spine: Cervical spine detailed separately today. Normal visible bone marrow signal. Sinuses/Orbits: Negative orbits. Trace paranasal sinus mucosal thickening. Other: Mastoids are well aerated. Grossly normal visible internal auditory structures. Right superior convexity scalp soft tissue swelling and edema (series 20, image 48 and series 26, image 12). Normal underlying calvarium marrow signal. Other scalp and face soft tissues appear normal. IMPRESSION: 1. Normal MRI appearance of the brain. No acute intracranial abnormality. 2. Nonspecific right superior convexity scalp soft tissue swelling and edema. Query recent trauma/fall. 3. See Cervical Spine MRI reported separately. Electronically Signed   By: Genevie Ann M.D.   On: 10/18/2020 04:26   MR Cervical Spine W or Wo Contrast  Addendum Date: 10/18/2020   ADDENDUM REPORT: 10/18/2020 04:51 ADDENDUM: Study discussed by telephone with Dr. Hulan Saas on 10/18/2020 at 0447 hours. He is already in discussion with Neurosurgery. Electronically Signed   By: Genevie Ann M.D.   On: 10/18/2020 04:51   Result Date: 10/18/2020 CLINICAL DATA:  35 year old male with new onset weakness, loss of bilateral hand grip, weakness in both feet. History of stage III lymphoma with most recent chemotherapy 2 weeks ago. EXAM: MRI CERVICAL SPINE WITHOUT AND WITH CONTRAST TECHNIQUE: Multiplanar and multiecho pulse sequences of the cervical spine, to include the craniocervical junction and cervicothoracic junction, were obtained without and with intravenous contrast. CONTRAST:  31mL GADAVIST GADOBUTROL 1 MMOL/ML IV SOLN in conjunction with contrast enhanced imaging of the brain reported separately. COMPARISON:  Brain MRI today reported separately. Thoracic and lumbar MRI 0302 hours today. FINDINGS: Alignment: Straightening and mild reversal of cervical lordosis. No significant  spondylolisthesis. Vertebrae: Minimal degenerative endplate marrow edema at C5-C6. Normal background bone marrow signal, with no suspicious osseous lesion. Cord: Abnormal. Bulky disc extrusion at C5-C6 results in mass effect on  the spinal cord and abnormal central cord signal (series 7, image 9). The abnormal increased T2 cord signal is eccentric to the left just above the disc (series 8, image 16). See additional details below. No definite cord enhancement. Above the C4-C5 disc and below C6 the visible spinal cord appears within normal limits. No dural thickening. Posterior Fossa, vertebral arteries, paraspinal tissues: Cervicomedullary junction is within normal limits. Brain detailed separately today. Preserved major vascular flow voids in the neck. Negative visible neck soft tissues, right lung apex. Disc levels: C2-C3: Mild facet hypertrophy. Mild bilateral C3 neural foraminal stenosis. C3-C4: Right paracentral disc protrusion (series 8, image 9). Mild spinal stenosis. Subtle cord mass effect. Additional right eccentric disc bulging and endplate spurring with mild facet hypertrophy results in mild left and mild to moderate right C4 foraminal stenosis. C4-C5: Mildly lobulated right eccentric circumferential disc bulge and endplate spurring. Mild spinal stenosis. No cord mass effect. Mild right C5 foraminal stenosis. C5-C6: Bulky left paracentral disc extrusion (series 5, image 9 and series 8, image 19). Moderate to severe spinal stenosis and cord mass effect with abnormal cord signal as stated above. Additional disc bulging and endplate spurring at this level. Mild left but moderate to severe right C6 foraminal stenosis. C6-C7: Left eccentric circumferential disc bulge and endplate spurring. No significant spinal stenosis. Mild-to-moderate left C7 neural foraminal stenosis. C7-T1: Foraminal, far lateral disc bulge and endplate spurring. Mild ligament flavum hypertrophy. No spinal stenosis. But there is moderate  to severe bilateral C8 neural foraminal stenosis. Stable visible upper thoracic levels. IMPRESSION: 1. Bulky degenerative disc extrusion at C5-C6 with severe spinal stenosis, spinal cord mass effect and abnormal cord signal (edema and/or myelomalacia). Moderate to severe multifactorial right C6 foraminal stenosis. 2. Smaller disc herniation at C3-C4, and lobulated disc bulging at C4-C5 with up to mild spinal stenosis at those levels. Up to moderate degenerative right C4, left C7, and moderate to severe bilateral C8 neural foraminal stenosis. 3. No metastatic disease identified in the cervical spine. Electronically Signed: By: Genevie Ann M.D. On: 10/18/2020 04:34   MR THORACIC SPINE W WO CONTRAST  Result Date: 10/18/2020 CLINICAL DATA:  Stage 3 lymphoma.  New onset weakness. EXAM: MRI THORACIC AND LUMBAR SPINE WITHOUT AND WITH CONTRAST TECHNIQUE: Multiplanar and multiecho pulse sequences of the thoracic and lumbar spine were obtained without and with intravenous contrast. CONTRAST:  80mL GADAVIST GADOBUTROL 1 MMOL/ML IV SOLN COMPARISON:  None. FINDINGS: MRI THORACIC SPINE FINDINGS Alignment:  Physiologic. Vertebrae: No fracture, evidence of discitis, or bone lesion. Cord: Normal signal and morphology. No abnormal contrast enhancement. Paraspinal and other soft tissues: Large amount of anterior mediastinal lymphadenopathy, incompletely visualized. Disc levels: No spinal canal stenosis. MRI LUMBAR SPINE FINDINGS Segmentation:  Standard. Alignment:  Physiologic. Vertebrae:  No fracture, evidence of discitis, or bone lesion. Conus medullaris: Extends to the L1 level and appears normal. No abnormal contrast enhancement. Paraspinal and other soft tissues: Negative. Disc levels: Small left L5-S1 extraforaminal disc protrusion. No spinal canal or neural foraminal stenosis. IMPRESSION: 1. No evidence of metastatic disease to the thoracic or lumbar spine. 2. Partially visualized anterior mediastinal lymphadenopathy, consistent  with lymphoma. 3. Small left L5-S1 extraforaminal disc protrusion could irritate the left L5 nerve root. Electronically Signed   By: Ulyses Jarred M.D.   On: 10/18/2020 04:00   MR Lumbar Spine W Wo Contrast  Result Date: 10/18/2020 CLINICAL DATA:  Stage 3 lymphoma.  New onset weakness. EXAM: MRI THORACIC AND LUMBAR SPINE WITHOUT AND WITH CONTRAST  TECHNIQUE: Multiplanar and multiecho pulse sequences of the thoracic and lumbar spine were obtained without and with intravenous contrast. CONTRAST:  35mL GADAVIST GADOBUTROL 1 MMOL/ML IV SOLN COMPARISON:  None. FINDINGS: MRI THORACIC SPINE FINDINGS Alignment:  Physiologic. Vertebrae: No fracture, evidence of discitis, or bone lesion. Cord: Normal signal and morphology. No abnormal contrast enhancement. Paraspinal and other soft tissues: Large amount of anterior mediastinal lymphadenopathy, incompletely visualized. Disc levels: No spinal canal stenosis. MRI LUMBAR SPINE FINDINGS Segmentation:  Standard. Alignment:  Physiologic. Vertebrae:  No fracture, evidence of discitis, or bone lesion. Conus medullaris: Extends to the L1 level and appears normal. No abnormal contrast enhancement. Paraspinal and other soft tissues: Negative. Disc levels: Small left L5-S1 extraforaminal disc protrusion. No spinal canal or neural foraminal stenosis. IMPRESSION: 1. No evidence of metastatic disease to the thoracic or lumbar spine. 2. Partially visualized anterior mediastinal lymphadenopathy, consistent with lymphoma. 3. Small left L5-S1 extraforaminal disc protrusion could irritate the left L5 nerve root. Electronically Signed   By: Ulyses Jarred M.D.   On: 10/18/2020 04:00    ____________________________________________   PROCEDURES  Procedure(s) performed (including Critical Care):  .1-3 Lead EKG Interpretation Performed by: Lucrezia Starch, MD Authorized by: Lucrezia Starch, MD     Interpretation: normal     ECG rate assessment: normal     Rhythm: sinus rhythm      Ectopy: none     Conduction: normal       ____________________________________________   INITIAL IMPRESSION / ASSESSMENT AND PLAN / ED COURSE      Patient presents with above to history exam for assessment of some weakness in his right hemibody but initially reported started about an hour and half prior to arrival with it seems to have been worsening little bit over the last couple days.  He has some weakness and paresthesias in his left hemibody that has been worsening over least over the last month.  This is in the setting of receiving vinblastine for his Hodgkin's lymphoma and appearing fairly intoxicated on arrival.  Differential includes CVA, neuropathy secondary to chemotherapy, metabolic derangements, possible metastatic lymphoma with involvement of brain and spinal cord.  CT head shows no evidence of acute intracranial abnormality.  Chest x-ray shows stable intrathoracic mass.  ECG shows no evidence of arrhythmia or ischemia.  CBC remarkable for WBC count of 3.6 and no acute anemia.  CMP remarkable for no significant electrolyte or metabolic derangements.  Serum ethanol is elevated at 255.  Covid is negative and magnesium is WNL.  Given concern for possible impingement of spinal cord or involvement of patient's brain from his known lymphoma MRI brain and spinal cord imaging ordered.  MR brain shows no evidence of stroke or acute intracranial abnormality.  MRI T and L-spine are unremarkable.  MR C-spine does show evidence of disc herniation with some cord edema.  While MRI brain does not possible hematoma versus infected from lymphoma patient adamantly denies any recent trauma although given intoxication is certainly possible he fell and does not recall this and has an element of possible central cord syndrome.   I discussed these findings and patient's presentation with on-call neurosurgeon Dr. Lacinda Axon who recommended medicine admission with plan to maintain patient's maps 75-80.   Patient will require PT OT evaluations and will be seen by neurosurgery later today.  Patient admitted in stable condition.  ____________________________________________   FINAL CLINICAL IMPRESSION(S) / ED DIAGNOSES  Final diagnoses:  Weakness  Dehydration  Blood alcohol level of 240  mg/100 ml or more  Hodgkin lymphoma, unspecified Hodgkin lymphoma type, unspecified body region [facility])  Cervical disc herniation  Spinal cord compression (HCC)    Medications  oxyCODONE-acetaminophen (PERCOCET/ROXICET) 5-325 MG per tablet 1 tablet (1 tablet Oral Given 10/18/20 0434)  Tdap (BOOSTRIX) injection 0.5 mL (has no administration in time range)  lactated ringers bolus 1,000 mL (0 mLs Intravenous Stopped 10/18/20 0255)  gadobutrol (GADAVIST) 1 MMOL/ML injection 7 mL (7 mLs Intravenous Contrast Given 10/18/20 0344)  HYDROmorphone (DILAUDID) injection 0.5 mg (0.5 mg Intravenous Given 10/18/20 0253)  ondansetron (ZOFRAN) injection 4 mg (4 mg Intravenous Given 10/18/20 0434)     ED Discharge Orders    None       Note:  This document was prepared using Dragon voice recognition software and may include unintentional dictation errors.   Lucrezia Starch, MD 10/18/20 0459    Lucrezia Starch, MD 10/18/20 [phone]

## 2020-10-17 NOTE — ED Triage Notes (Signed)
Pt presents to ER via ems with c/o new onset weakness.  Pt has hx of stage 3 lymphoma with last chemo tx 2 weeks ago.  Pt states for last 1.5 hrs he has not been able to use his hands or feet like normal.  Pt has weakness in left side at baseline but it has been worse today.  Pt unable to grip in BIL hands and weakness in BIL feet.  Pt A&O x4, with no resp distress noted.

## 2020-10-18 ENCOUNTER — Emergency Department: Payer: Self-pay

## 2020-10-18 DIAGNOSIS — C8190 Hodgkin lymphoma, unspecified, unspecified site: Secondary | ICD-10-CM

## 2020-10-18 DIAGNOSIS — T07XXXA Unspecified multiple injuries, initial encounter: Secondary | ICD-10-CM

## 2020-10-18 DIAGNOSIS — C8170 Other classical Hodgkin lymphoma, unspecified site: Secondary | ICD-10-CM

## 2020-10-18 DIAGNOSIS — R29898 Other symptoms and signs involving the musculoskeletal system: Secondary | ICD-10-CM

## 2020-10-18 DIAGNOSIS — M5020 Other cervical disc displacement, unspecified cervical region: Secondary | ICD-10-CM

## 2020-10-18 DIAGNOSIS — M4802 Spinal stenosis, cervical region: Secondary | ICD-10-CM | POA: Diagnosis present

## 2020-10-18 DIAGNOSIS — R780 Finding of alcohol in blood: Secondary | ICD-10-CM

## 2020-10-18 LAB — PHOSPHORUS: Phosphorus: 4.3 mg/dL (ref 2.5–4.6)

## 2020-10-18 LAB — COMPREHENSIVE METABOLIC PANEL
ALT: 39 U/L (ref 0–44)
AST: 40 U/L (ref 15–41)
Albumin: 4.9 g/dL (ref 3.5–5.0)
Alkaline Phosphatase: 54 U/L (ref 38–126)
Anion gap: 16 — ABNORMAL HIGH (ref 5–15)
BUN: 6 mg/dL (ref 6–20)
CO2: 21 mmol/L — ABNORMAL LOW (ref 22–32)
Calcium: 9.5 mg/dL (ref 8.9–10.3)
Chloride: 101 mmol/L (ref 98–111)
Creatinine, Ser: 0.67 mg/dL (ref 0.61–1.24)
GFR, Estimated: >60 mL/min (ref >60)
Glucose, Bld: 88 mg/dL (ref 70–99)
Potassium: 3.5 mmol/L (ref 3.5–5.1)
Sodium: 138 mmol/L (ref 135–145)
Total Bilirubin: 0.7 mg/dL (ref 0.3–1.2)
Total Protein: 8.6 g/dL — ABNORMAL HIGH (ref 6.5–8.1)

## 2020-10-18 LAB — URINE DRUG SCREEN, QUALITATIVE (ARMC ONLY)
Amphetamines, Ur Screen: NOT DETECTED
Barbiturates, Ur Screen: NOT DETECTED
Benzodiazepine, Ur Scrn: NOT DETECTED
Cannabinoid 50 Ng, Ur ~~LOC~~: POSITIVE — AB
Cocaine Metabolite,Ur ~~LOC~~: NOT DETECTED
MDMA (Ecstasy)Ur Screen: NOT DETECTED
Methadone Scn, Ur: NOT DETECTED
Opiate, Ur Screen: NOT DETECTED
Phencyclidine (PCP) Ur S: NOT DETECTED
Tricyclic, Ur Screen: NOT DETECTED

## 2020-10-18 LAB — ETHANOL: Alcohol, Ethyl (B): 255 mg/dL — ABNORMAL HIGH (ref <10)

## 2020-10-18 LAB — MAGNESIUM: Magnesium: 2.3 mg/dL (ref 1.7–2.4)

## 2020-10-18 LAB — RESP PANEL BY RT-PCR (FLU A&B, COVID) ARPGX2
Influenza A by PCR: NEGATIVE
Influenza B by PCR: NEGATIVE
SARS Coronavirus 2 by RT PCR: NEGATIVE

## 2020-10-18 IMAGING — MR MR LUMBAR SPINE WO/W CM
6 of 7 series · 34 of 48 positions shown · IV contrast (gadavist)
Comparison: None.

CLINICAL DATA: Stage 3 lymphoma.  New onset weakness.

EXAM:
MRI THORACIC AND LUMBAR SPINE WITHOUT AND WITH CONTRAST
TECHNIQUE: Multiplanar and multiecho pulse sequences of the thoracic and lumbar
spine were obtained without and with intravenous contrast.
CONTRAST:  7mL GADAVIST GADOBUTROL 1 MMOL/ML IV SOLN

[Series 1: T2 · sagittal · 4.0mm · 1.02mm/px · 6 of 16 slices shown (1 of 2)]
[im 1/16]
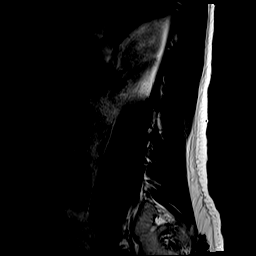
[im 4/16]
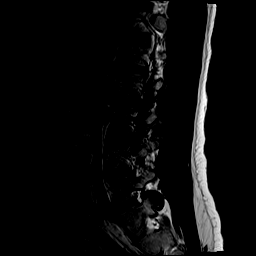
[im 7/16]
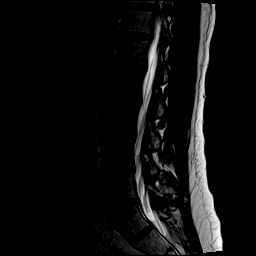
[im 10/16]
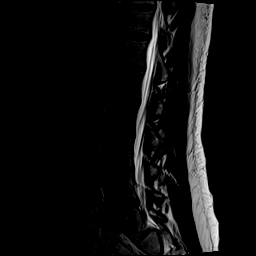
[im 13/16]
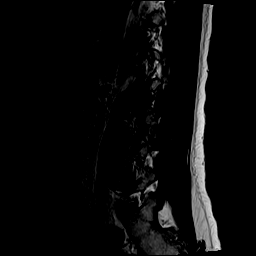
[im 16/16]
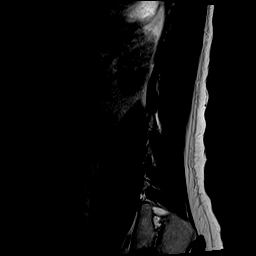

[Series 2: T1 · sagittal · 4.0mm · 1.02mm/px · 4 of 15 slices shown (1 of 2)]
[im 1/15]
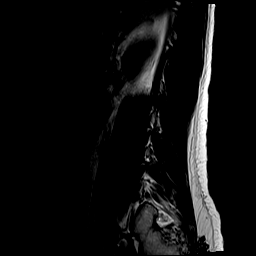
[im 5/15]
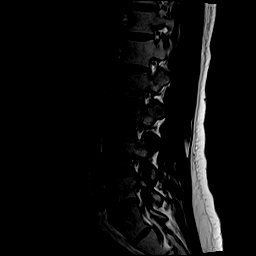
[im 10/15]
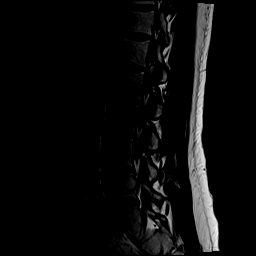
[im 15/15]
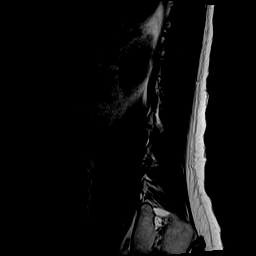

[Series 3: STIR · sagittal · 4.0mm · 0.51mm/px · 4 of 15 slices shown]
[im 1/15]
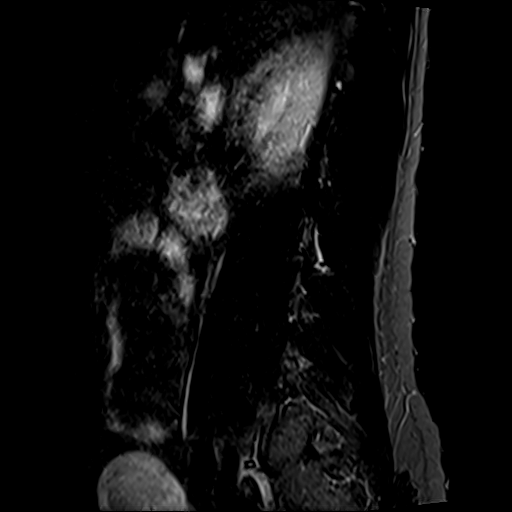
[im 5/15]
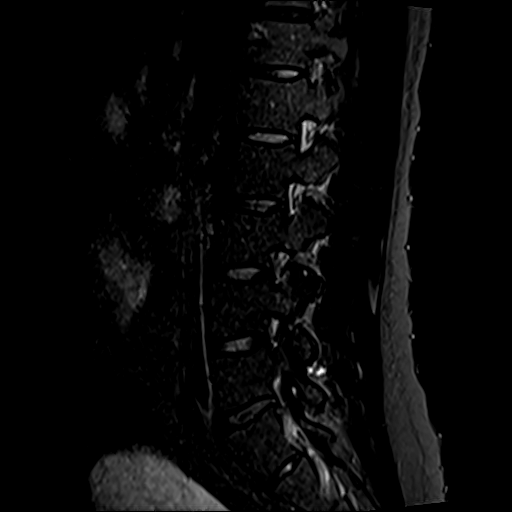
[im 10/15]
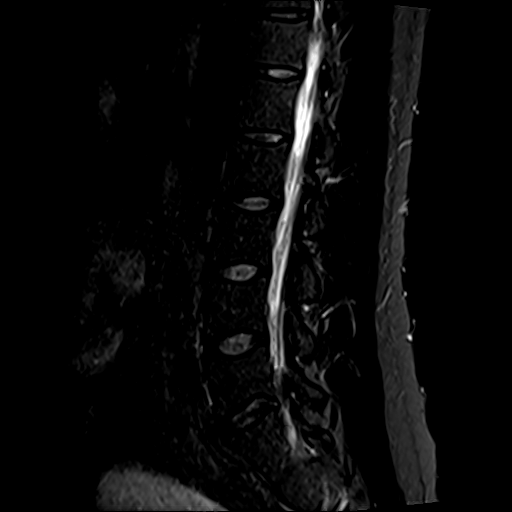
[im 15/15]
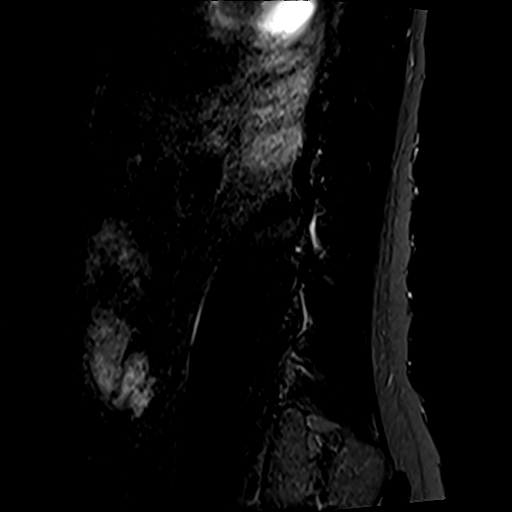

[Series 4: T2 · axial · 4.0mm · 0.78mm/px · z∈[-394,-175]mm · 8 of 36 slices shown (2 of 2)]
[im 1/36]
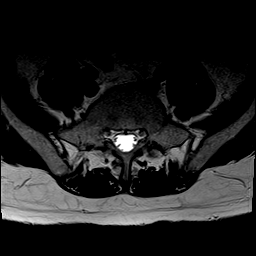
[im 4/36]
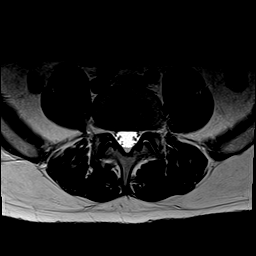
[im 12/36]
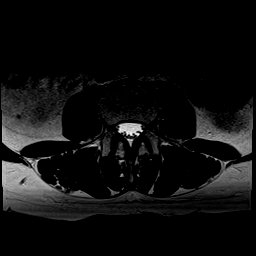
[im 16/36]
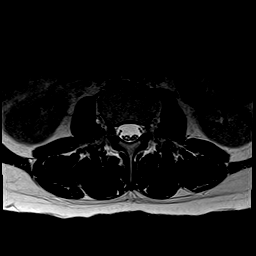
[im 20/36]
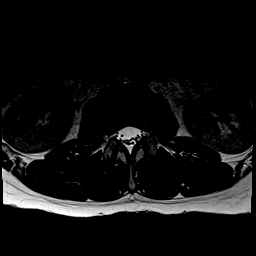
[im 24/36]
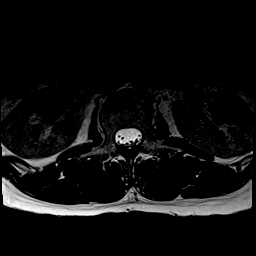
[im 32/36]
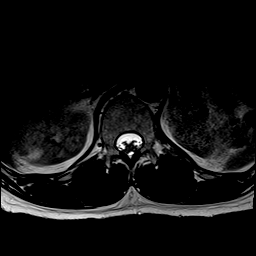
[im 36/36]
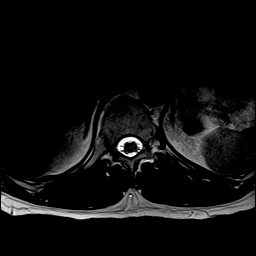

[Series 5: T1 · axial · 4.0mm · 0.39mm/px · z∈[-394,-175]mm · 8 of 36 slices shown (2 of 2)]
[im 1/36]
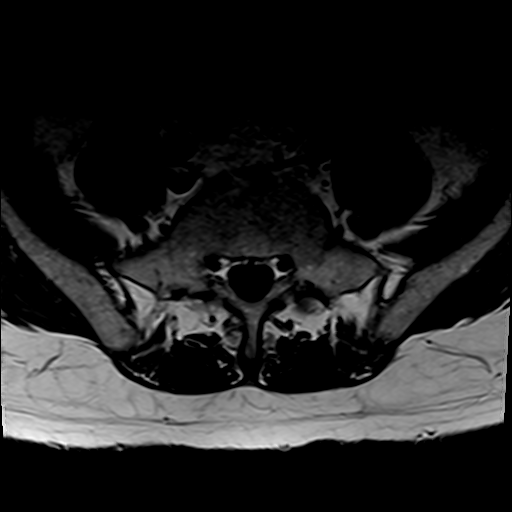
[im 4/36]
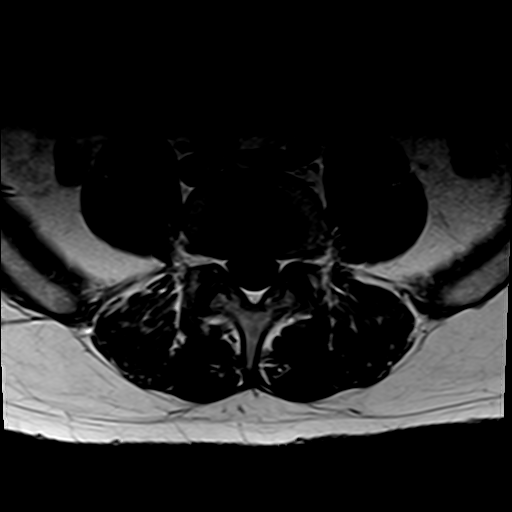
[im 12/36]
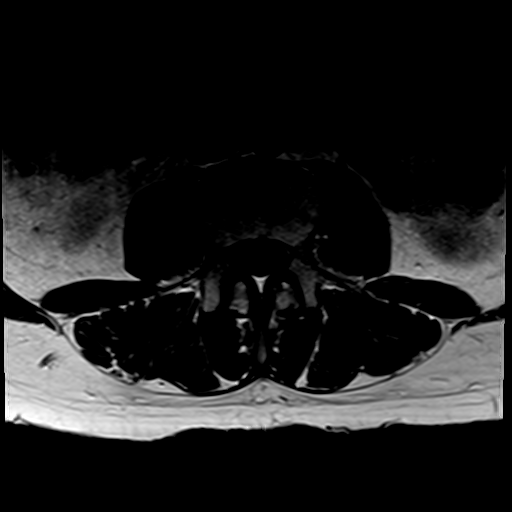
[im 16/36]
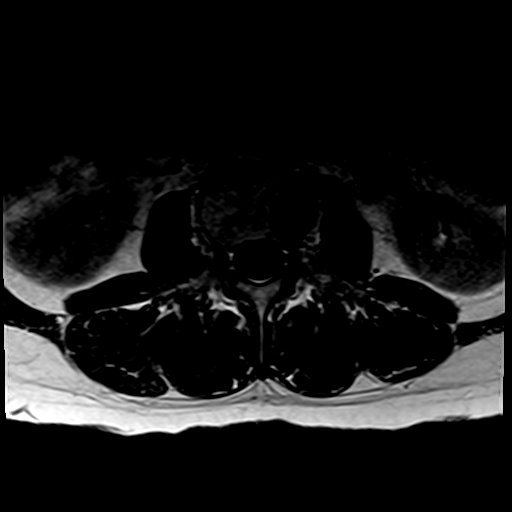
[im 20/36]
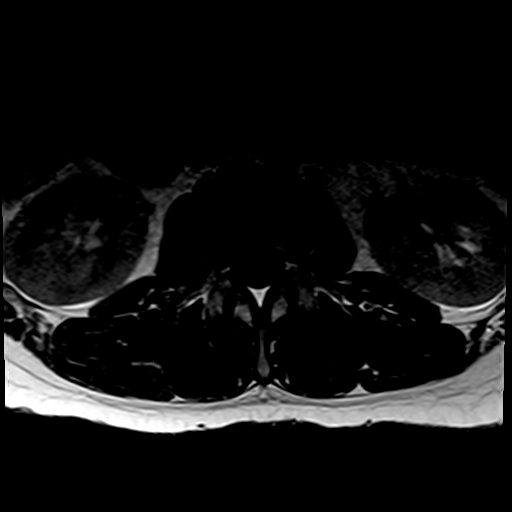
[im 24/36]
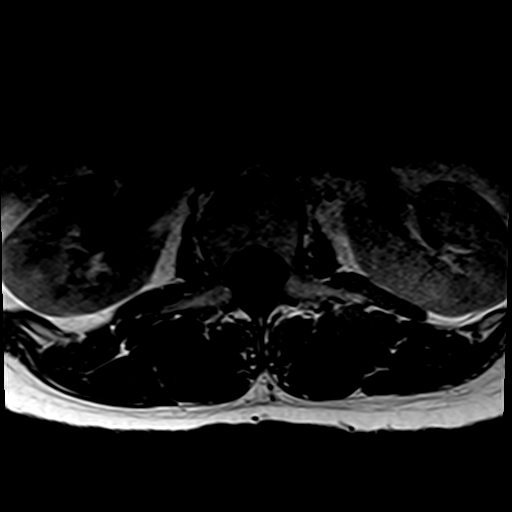
[im 32/36]
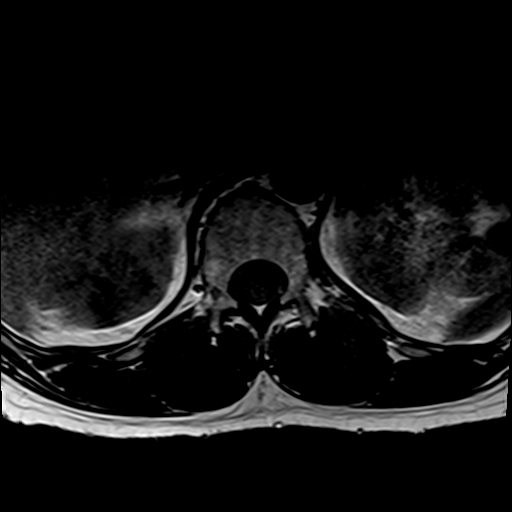
[im 36/36]
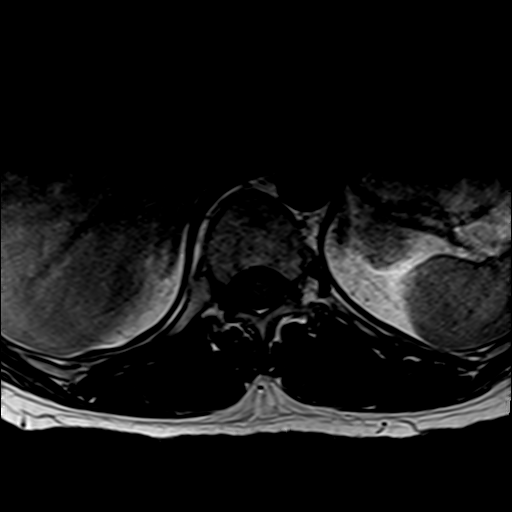

[Series 6: T1 fat-sat post-contrast · sagittal · 4.0mm · 1.02mm/px · 4 of 15 slices shown]
[im 1/15]
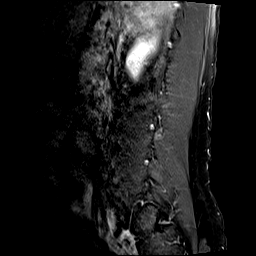
[im 5/15]
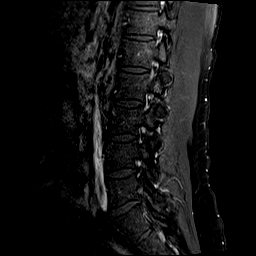
[im 10/15]
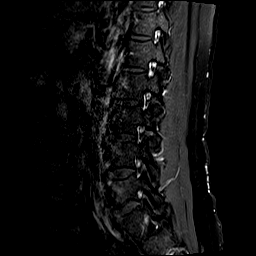
[im 15/15]
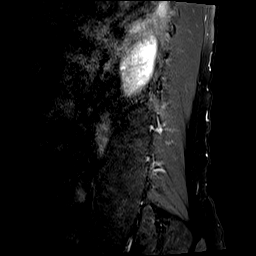

[34 of 48 positions shown; findings below may reference images not displayed]

FINDINGS: MRI THORACIC SPINE FINDINGS

Alignment:  Physiologic.

Vertebrae: No fracture, evidence of discitis, or bone lesion.

Cord: Normal signal and morphology. No abnormal contrast
enhancement.

Paraspinal and other soft tissues: Large amount of anterior
mediastinal lymphadenopathy, incompletely visualized.

Disc levels:

No spinal canal stenosis.

MRI LUMBAR SPINE FINDINGS

Segmentation:  Standard.

Alignment:  Physiologic.

Vertebrae:  No fracture, evidence of discitis, or bone lesion.

Conus medullaris: Extends to the L1 level and appears normal. No
abnormal contrast enhancement.

Paraspinal and other soft tissues: Negative.

Disc levels:

Small left L5-S1 extraforaminal disc protrusion. No spinal canal or
neural foraminal stenosis.
IMPRESSION: 1. No evidence of metastatic disease to the thoracic or lumbar
spine.
2. Partially visualized anterior mediastinal lymphadenopathy,
consistent with lymphoma.
3. Small left L5-S1 extraforaminal disc protrusion could irritate
the left L5 nerve root.

## 2020-10-18 IMAGING — CR DG CHEST 2V
3 series · 3 of 3 positions shown · non-contrast
Comparison: [DATE], CT [DATE]

CLINICAL DATA: Left shoulder pain, generalized weakness, lymphoma

EXAM:
CHEST - 2 VIEW

[chest lat (1 of 2)]
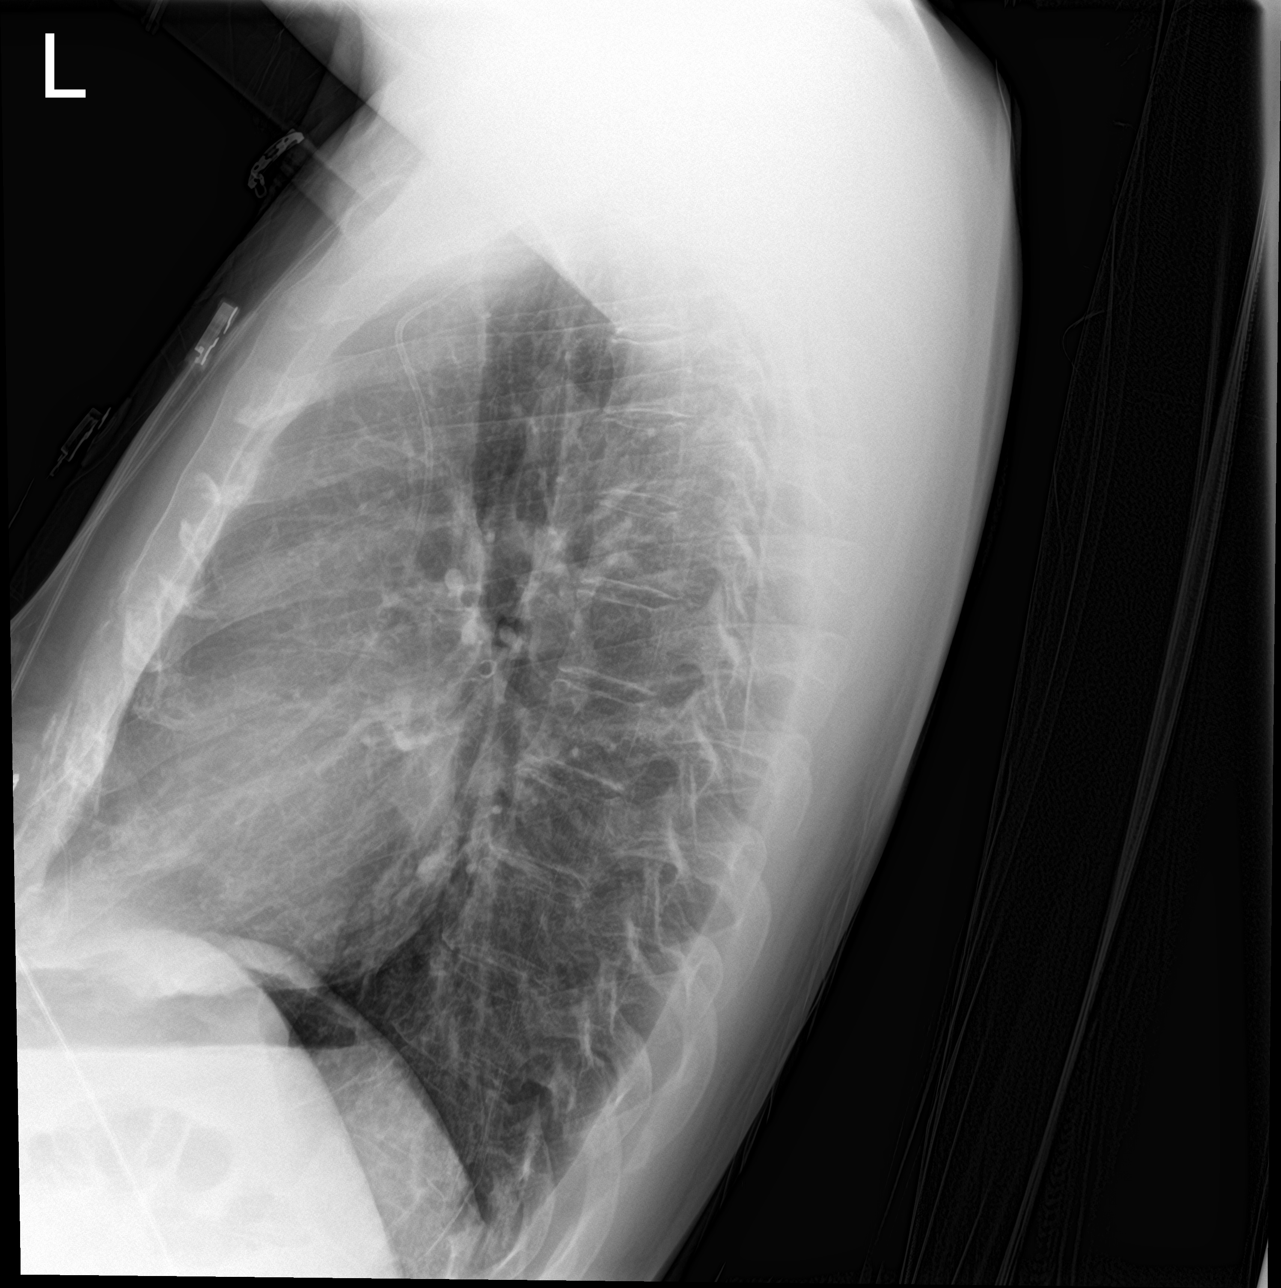

[chest ap]
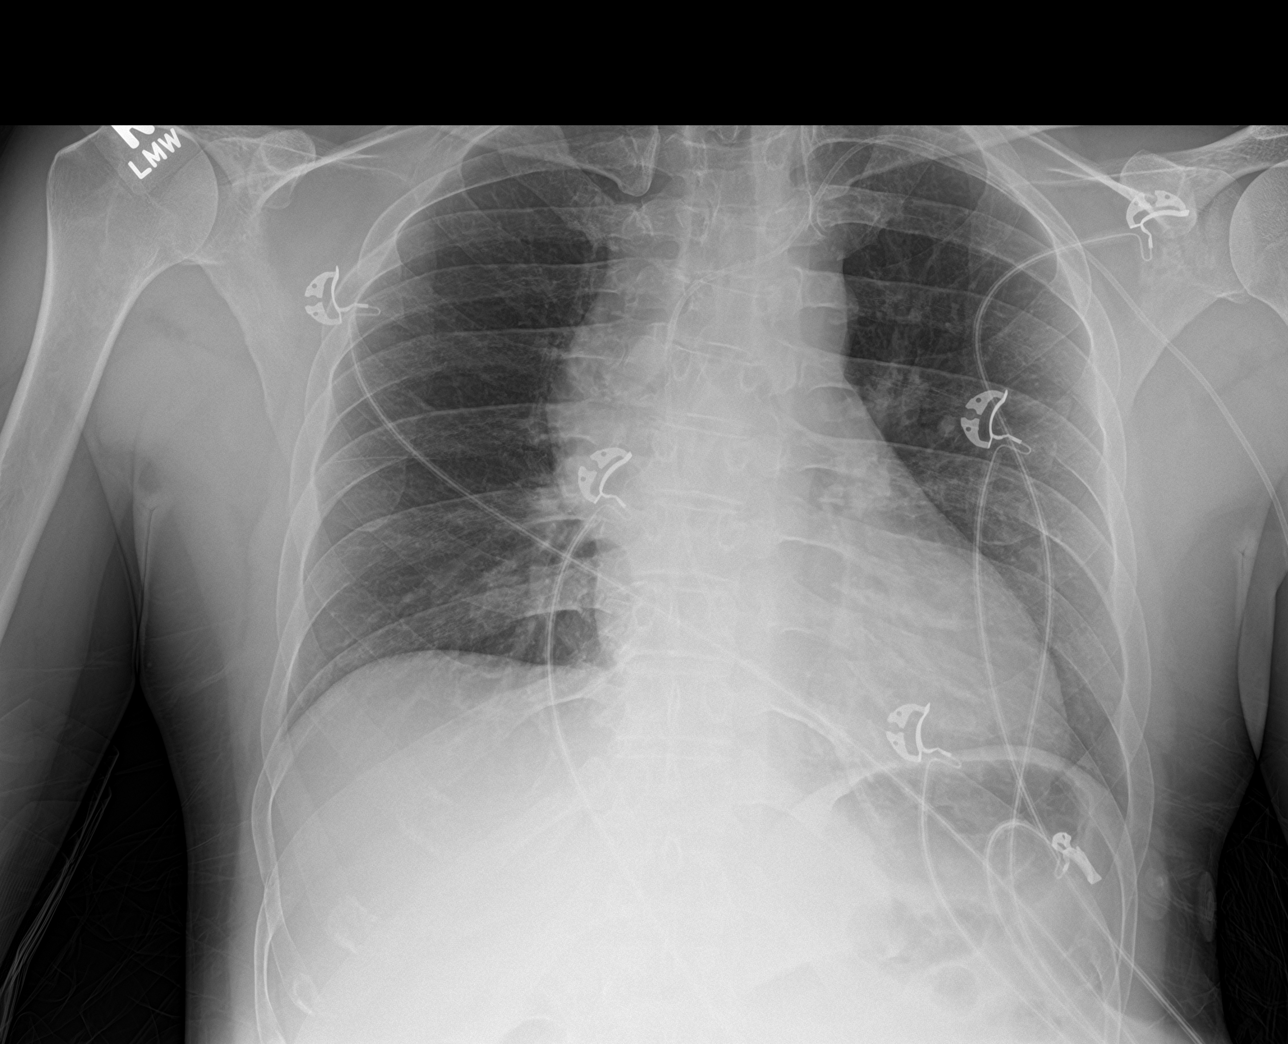

[chest lat (2 of 2)]
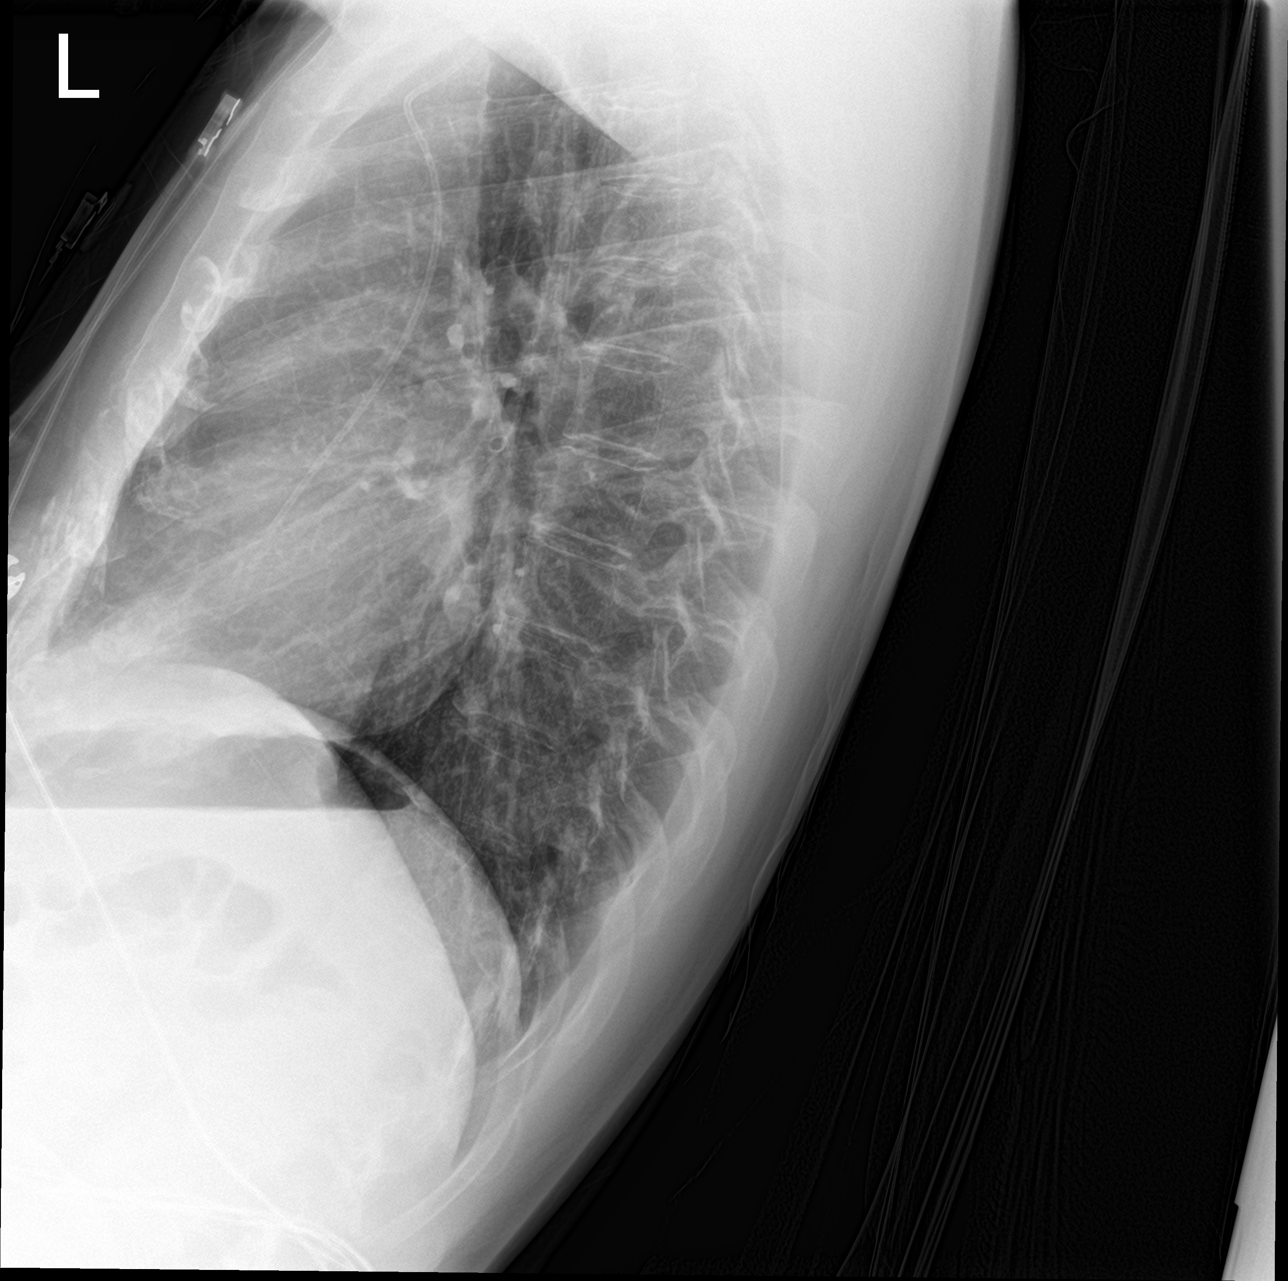

[3 of 3 positions shown; findings below may reference images not displayed]

FINDINGS: Lungs are clear. No pneumothorax or pleural effusion. Right
paratracheal and suprahilar soft tissue thickening is unchanged,
better appreciated on prior CT examination and related to the
anterior mediastinal mass. Left upper extremity PICC line tip noted
within the superior vena cava. Cardiac size within normal limits.
Pulmonary vascularity is normal. No acute bone abnormality.
IMPRESSION: Unchanged anterior mediastinal mass.

No radiographic evidence of acute cardiopulmonary disease.

## 2020-10-18 IMAGING — MR MR THORACIC SPINE WO/W CM
7 of 9 series · 32 of 48 positions shown · IV contrast (gadavist)
Comparison: None.

CLINICAL DATA: Stage 3 lymphoma.  New onset weakness.

EXAM:
MRI THORACIC AND LUMBAR SPINE WITHOUT AND WITH CONTRAST
TECHNIQUE: Multiplanar and multiecho pulse sequences of the thoracic and lumbar
spine were obtained without and with intravenous contrast.
CONTRAST:  7mL GADAVIST GADOBUTROL 1 MMOL/ML IV SOLN

[Series 16: T1 · sagittal · 5.0mm · 1.88mm/px · 1 of 11 slices shown (1 of 2)]
[im 1/11]
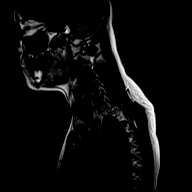

[Series 17: T2 · sagittal · 3.0mm · 1.33mm/px · 3 of 19 slices shown (1 of 2)]
[im 1/19]
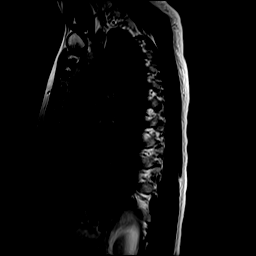
[im 10/19]
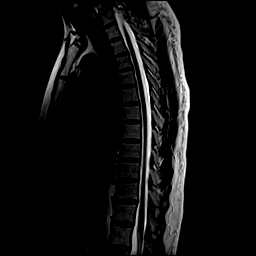
[im 19/19]
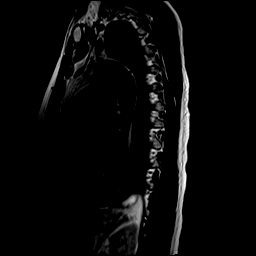

[Series 18: T1 · sagittal · 3.0mm · 1.33mm/px · 4 of 19 slices shown (2 of 2)]
[im 1/19]
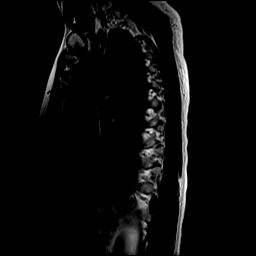
[im 7/19]
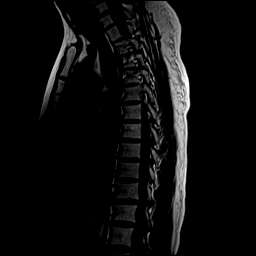
[im 13/19]
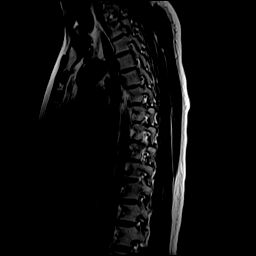
[im 19/19]
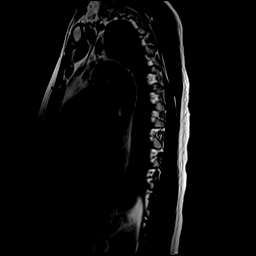

[Series 19: STIR · sagittal · 3.0mm · 0.66mm/px · 4 of 19 slices shown]
[im 1/19]
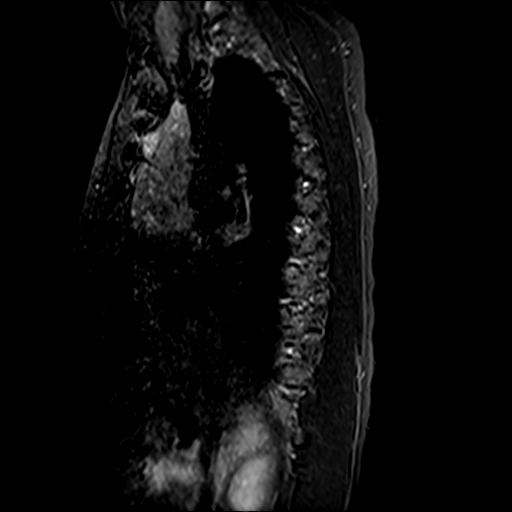
[im 7/19]
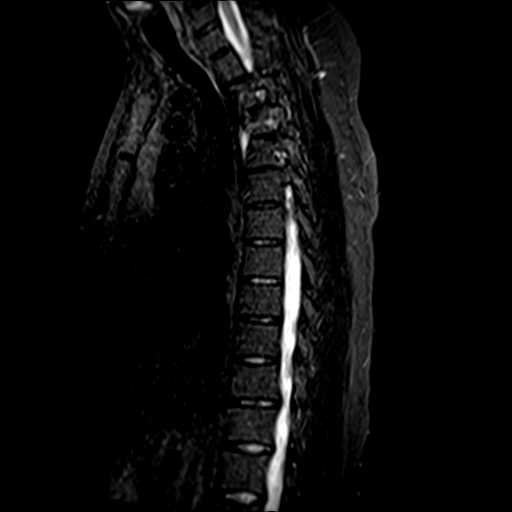
[im 13/19]
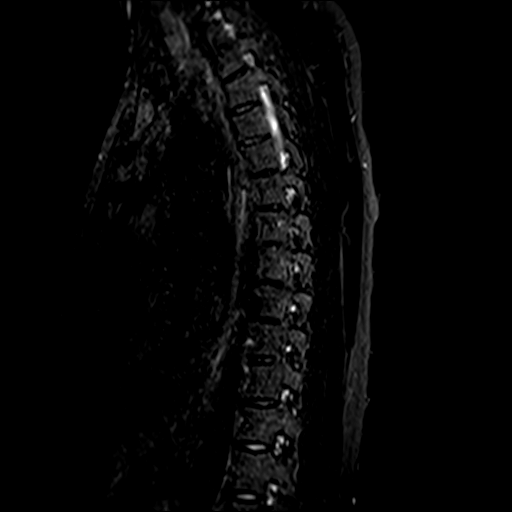
[im 19/19]
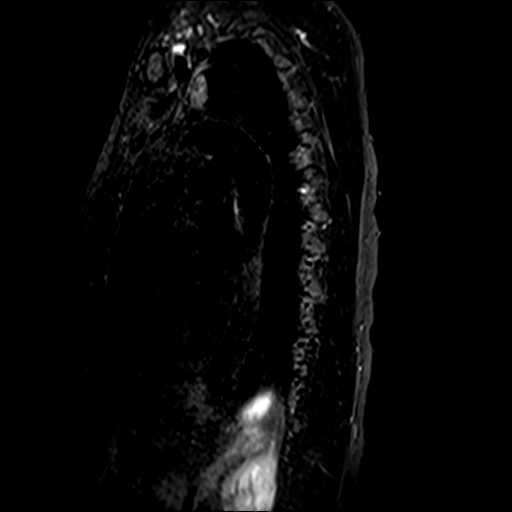

[Series 20: T2 · axial · 4.0mm · 0.59mm/px · z∈[-180,+56]mm · 8 of 39 slices shown (2 of 2)]
[im 1/39]
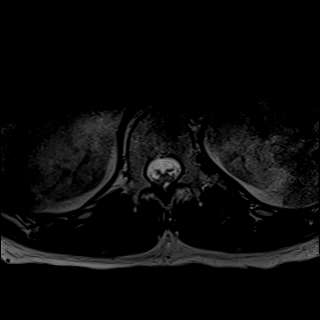
[im 6/39]
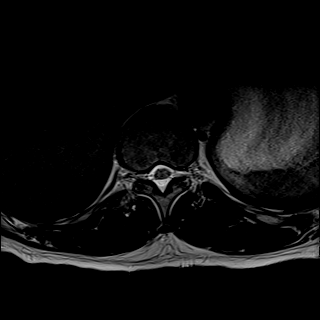
[im 11/39]
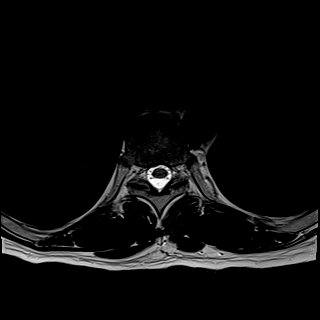
[im 17/39]
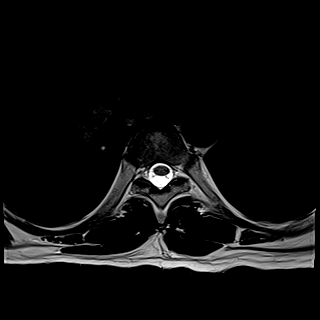
[im 22/39]
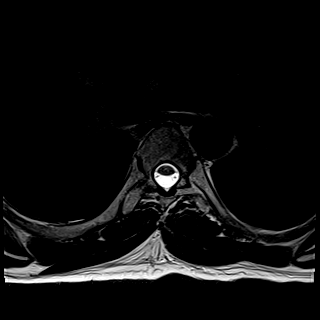
[im 28/39]
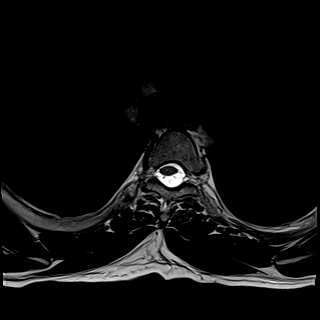
[im 33/39]
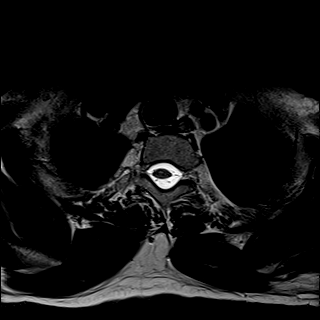
[im 39/39]
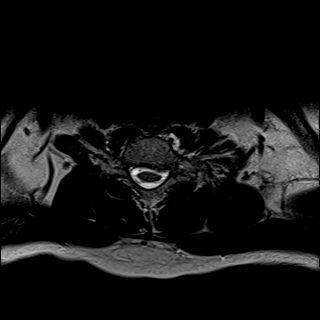

[Series 22: T1 post-contrast · axial · non-contrast · 4.0mm · 0.37mm/px · z∈[-180,+56]mm · 8 of 39 slices shown]
[im 1/39]
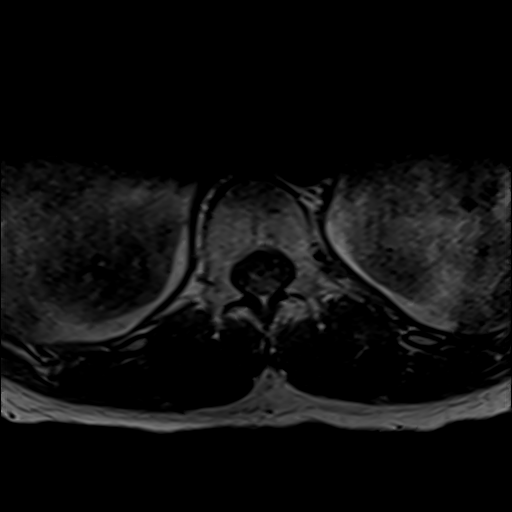
[im 6/39]
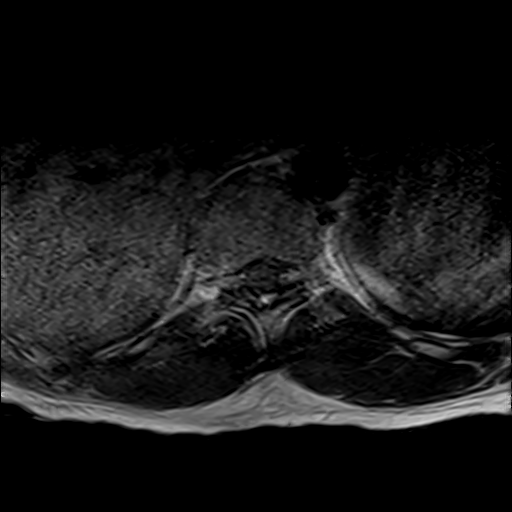
[im 11/39]
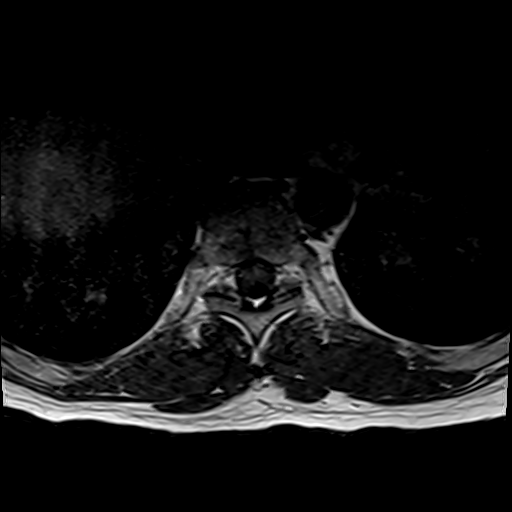
[im 17/39]
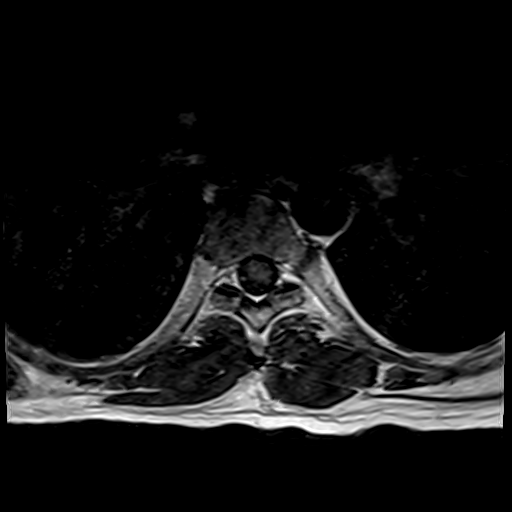
[im 22/39]
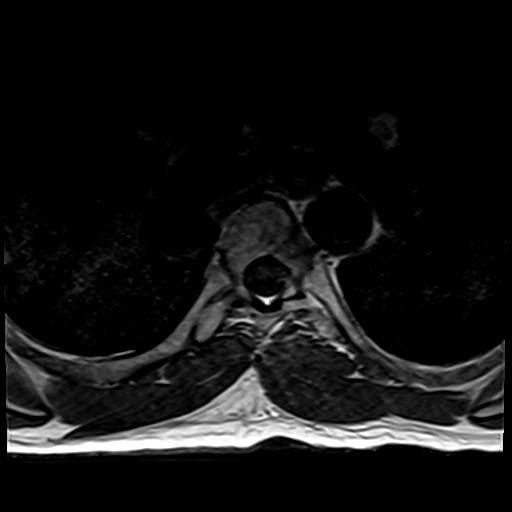
[im 28/39]
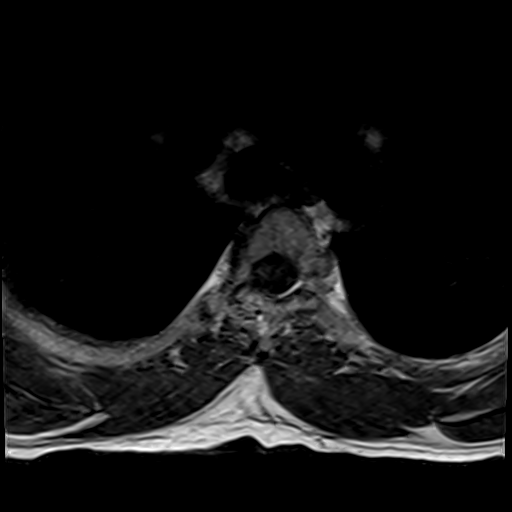
[im 33/39]
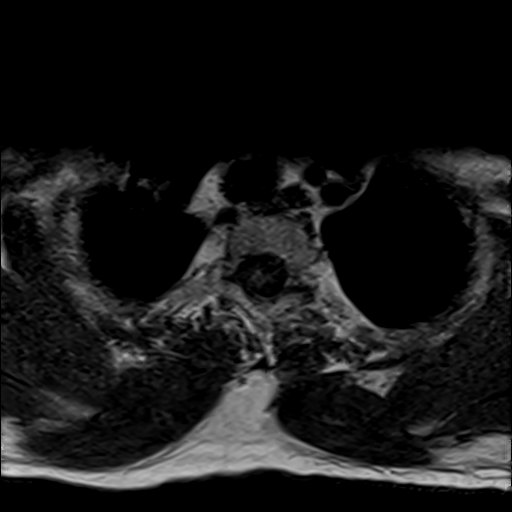
[im 39/39]
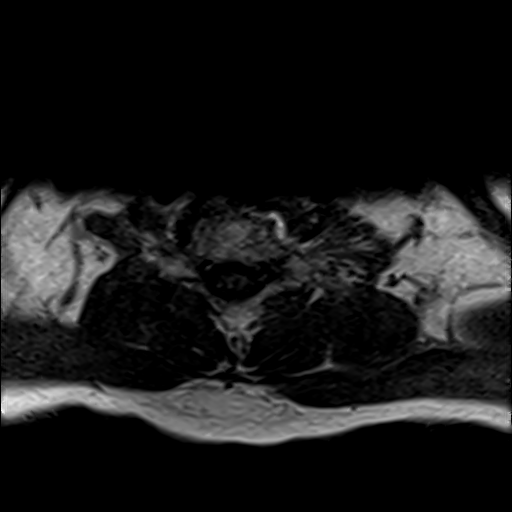

[Series 24: T1 fat-sat post-contrast · sagittal · 3.0mm · 1.33mm/px · 4 of 19 slices shown]
[im 1/19]
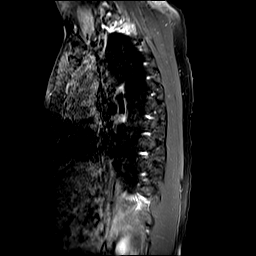
[im 7/19]
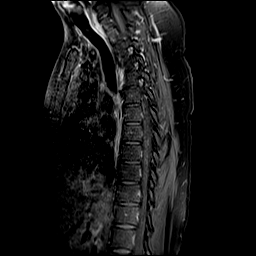
[im 13/19]
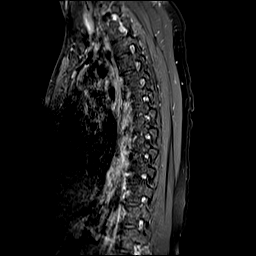
[im 19/19]
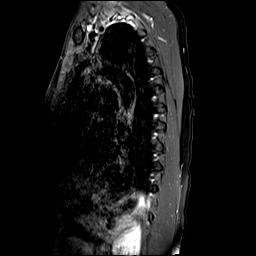

[32 of 48 positions shown; findings below may reference images not displayed]

FINDINGS: MRI THORACIC SPINE FINDINGS

Alignment:  Physiologic.

Vertebrae: No fracture, evidence of discitis, or bone lesion.

Cord: Normal signal and morphology. No abnormal contrast
enhancement.

Paraspinal and other soft tissues: Large amount of anterior
mediastinal lymphadenopathy, incompletely visualized.

Disc levels:

No spinal canal stenosis.

MRI LUMBAR SPINE FINDINGS

Segmentation:  Standard.

Alignment:  Physiologic.

Vertebrae:  No fracture, evidence of discitis, or bone lesion.

Conus medullaris: Extends to the L1 level and appears normal. No
abnormal contrast enhancement.

Paraspinal and other soft tissues: Negative.

Disc levels:

Small left L5-S1 extraforaminal disc protrusion. No spinal canal or
neural foraminal stenosis.
IMPRESSION: 1. No evidence of metastatic disease to the thoracic or lumbar
spine.
2. Partially visualized anterior mediastinal lymphadenopathy,
consistent with lymphoma.
3. Small left L5-S1 extraforaminal disc protrusion could irritate
the left L5 nerve root.

## 2020-10-18 IMAGING — MR MR CERVICAL SPINE WO/W CM
5 of 8 series · 25 of 48 positions shown · IV contrast (gadavist)
Comparison: Brain MRI today reported separately. Thoracic and
lumbar MRI [J6] hours today.
COMPARISON: Brain MRI today reported separately. Thoracic and
lumbar MRI [J6] hours today.

Addendum:
CLINICAL DATA: 34-year-old male with new onset weakness, loss of
bilateral hand grip, weakness in both feet. History of stage III
lymphoma with most recent chemotherapy 2 weeks ago.

EXAM:
MRI CERVICAL SPINE WITHOUT AND WITH CONTRAST
TECHNIQUE: Multiplanar and multiecho pulse sequences of the cervical spine, to
include the craniocervical junction and cervicothoracic junction,
were obtained without and with intravenous contrast.
CONTRAST:  7mL GADAVIST GADOBUTROL 1 MMOL/ML IV SOLN in conjunction
with contrast enhanced imaging of the brain reported separately.

[Series 5: T2 · sagittal · 3.0mm · 0.62mm/px · 4 of 15 slices shown (1 of 2)]
[im 1/15]
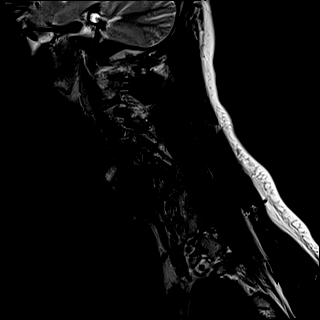
[im 5/15]
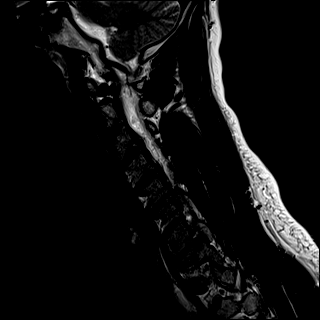
[im 10/15]
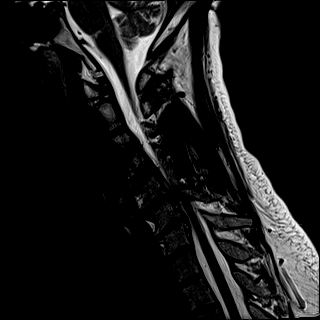
[im 15/15]
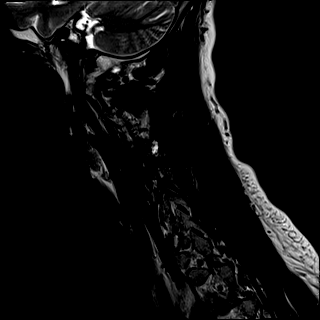

[Series 7: STIR · sagittal · 3.0mm · 0.62mm/px · 4 of 15 slices shown]
[im 1/15]
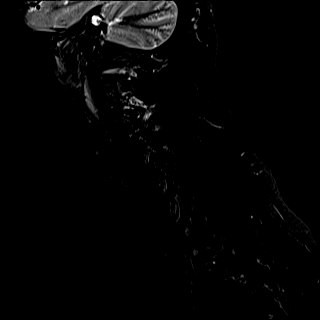
[im 5/15]
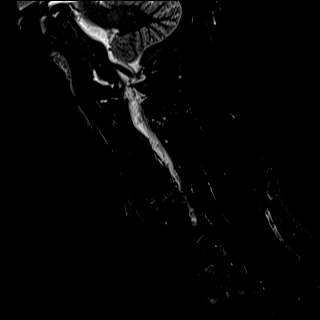
[im 10/15]
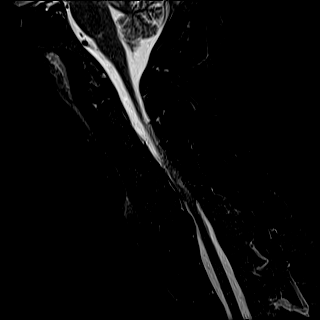
[im 15/15]
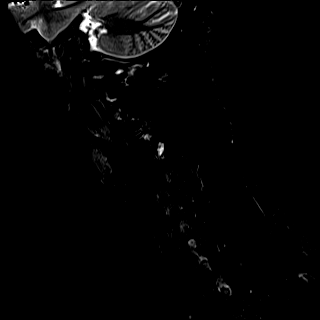

[Series 8: T2 · axial · 3.0mm · 0.70mm/px · z∈[-221,-133]mm · 8 of 30 slices shown (2 of 2)]
[im 1/30]
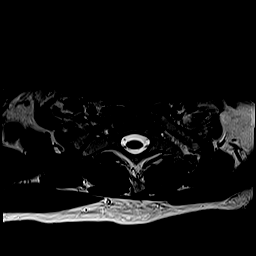
[im 5/30]
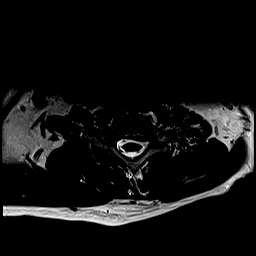
[im 9/30]
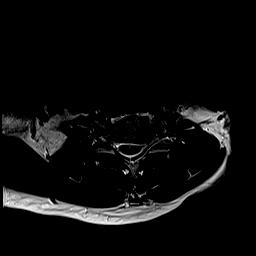
[im 13/30]
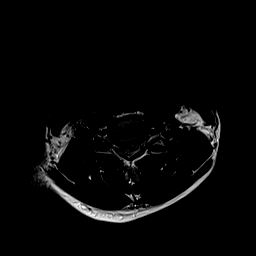
[im 17/30]
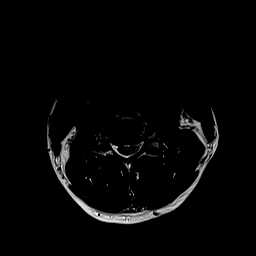
[im 21/30]
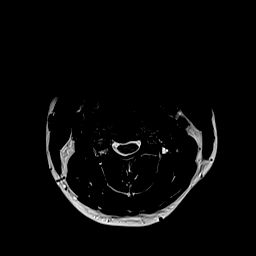
[im 25/30]
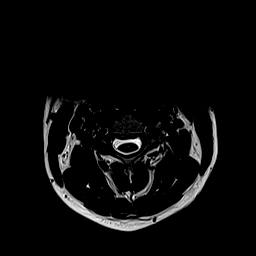
[im 30/30]
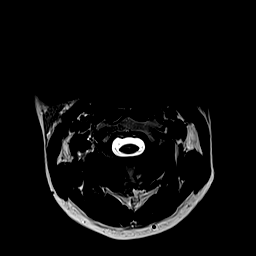

[Series 10: T1 · axial · non-contrast · 3.0mm · 0.35mm/px · z∈[-221,-133]mm · 8 of 30 slices shown]
[im 1/30]
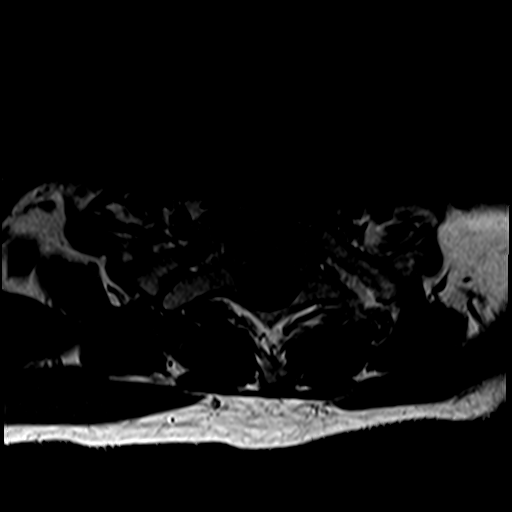
[im 5/30]
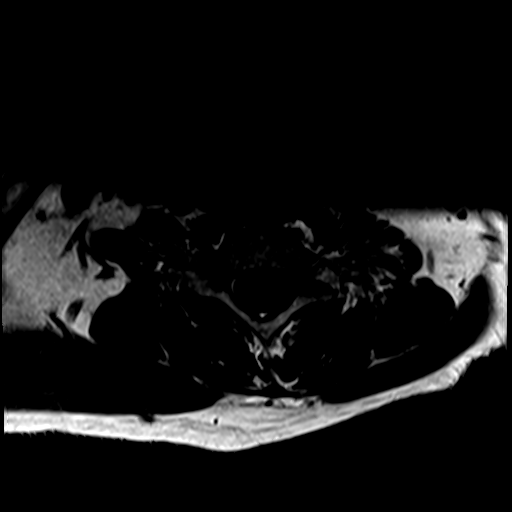
[im 9/30]
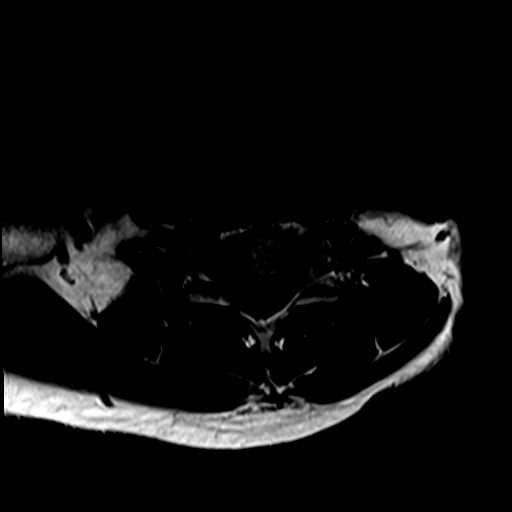
[im 13/30]
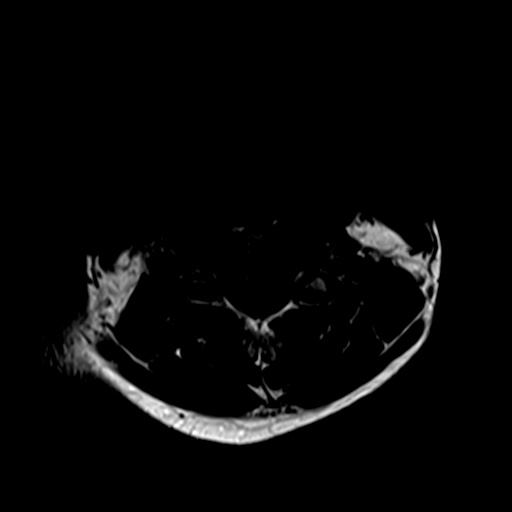
[im 17/30]
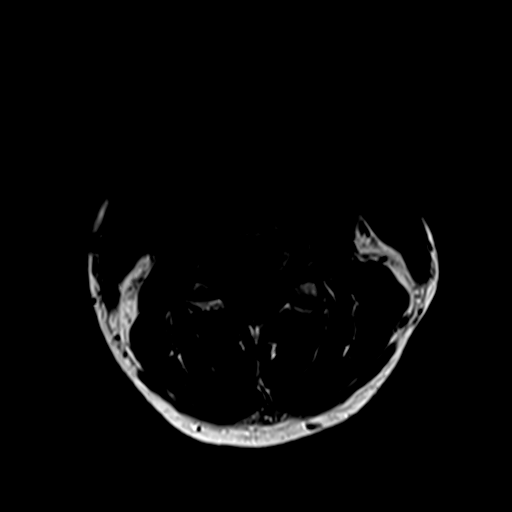
[im 21/30]
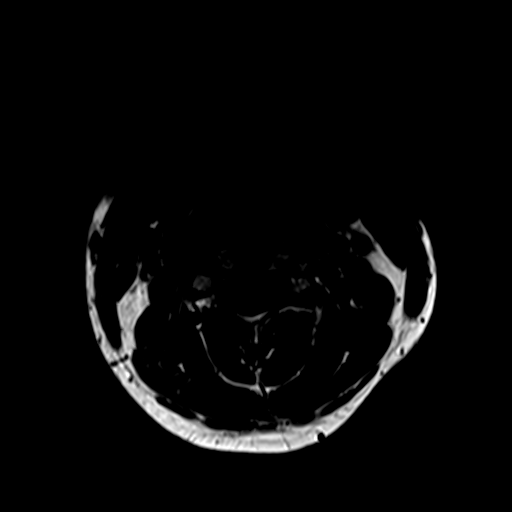
[im 25/30]
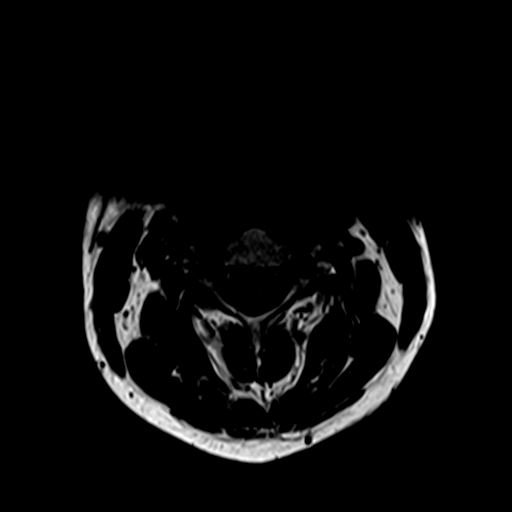
[im 30/30]
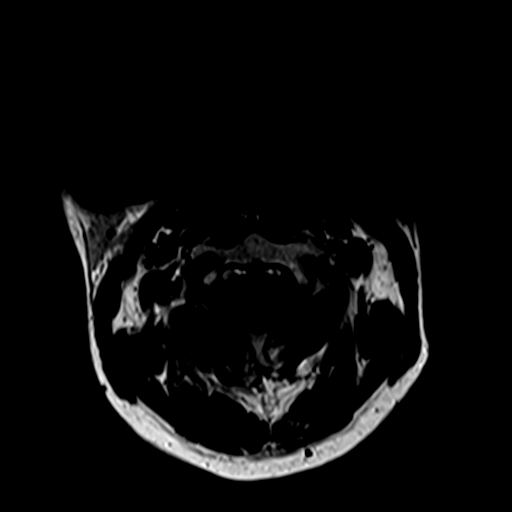

[Series 16: T1 post-contrast · axial · 3.0mm · 0.35mm/px · 1 of 30 slices shown]
[im 1/30]
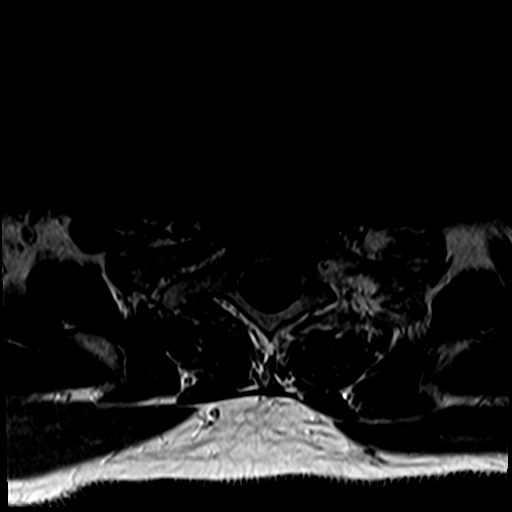

[25 of 48 positions shown; findings below may reference images not displayed]

FINDINGS: Alignment: Straightening and mild reversal of cervical lordosis. No
significant spondylolisthesis.

Vertebrae: Minimal degenerative endplate marrow edema at C5-C6.
Normal background bone marrow signal, with no suspicious osseous
lesion.

Cord: Abnormal. Bulky disc extrusion at C5-C6 results in mass effect
on the spinal cord and abnormal central cord signal (series 7, image
9). The abnormal increased T2 cord signal is eccentric to the left
just above the disc (series 8, image 16). See additional details
below. No definite cord enhancement.

Above the C4-C5 disc and below C6 the visible spinal cord appears
within normal limits. No dural thickening.

Posterior Fossa, vertebral arteries, paraspinal tissues:
Cervicomedullary junction is within normal limits. Brain detailed
separately today. Preserved major vascular flow voids in the neck.
Negative visible neck soft tissues, right lung apex.

Disc levels:

C2-C3: Mild facet hypertrophy. Mild bilateral C3 neural foraminal
stenosis.

C3-C4: Right paracentral disc protrusion (series 8, image 9). Mild
spinal stenosis. Subtle cord mass effect. Additional right eccentric
disc bulging and endplate spurring with mild facet hypertrophy
results in mild left and mild to moderate right C4 foraminal
stenosis.

C4-C5: Mildly lobulated right eccentric circumferential disc bulge
and endplate spurring. Mild spinal stenosis. No cord mass effect.
Mild right C5 foraminal stenosis.

C5-C6: Bulky left paracentral disc extrusion (series 5, image 9 and
series 8, image 19). Moderate to severe spinal stenosis and cord
mass effect with abnormal cord signal as stated above. Additional
disc bulging and endplate spurring at this level. Mild left but
moderate to severe right C6 foraminal stenosis.

C6-C7: Left eccentric circumferential disc bulge and endplate
spurring. No significant spinal stenosis. Mild-to-moderate left C7
neural foraminal stenosis.

C7-T1: Foraminal, far lateral disc bulge and endplate spurring. Mild
ligament flavum hypertrophy. No spinal stenosis. But there is
moderate to severe bilateral C8 neural foraminal stenosis.

Stable visible upper thoracic levels.
IMPRESSION: 1. Bulky degenerative disc extrusion at C5-C6 with severe spinal
stenosis, spinal cord mass effect and abnormal cord signal (edema
and/or myelomalacia). Moderate to severe multifactorial right C6
foraminal stenosis.

2. Smaller disc herniation at C3-C4, and lobulated disc bulging at
C4-C5 with up to mild spinal stenosis at those levels. Up to
moderate degenerative right C4, left C7, and moderate to severe
bilateral C8 neural foraminal stenosis.

3. No metastatic disease identified in the cervical spine.

ADDENDUM:
Study discussed by telephone with Dr. PACHAWO on [DATE] at
[J6] hours. He is already in discussion with Neurosurgery.

*** End of Addendum ***
FINDINGS: Alignment: Straightening and mild reversal of cervical lordosis. No
significant spondylolisthesis.

Vertebrae: Minimal degenerative endplate marrow edema at C5-C6.
Normal background bone marrow signal, with no suspicious osseous
lesion.

Cord: Abnormal. Bulky disc extrusion at C5-C6 results in mass effect
on the spinal cord and abnormal central cord signal (series 7, image
9). The abnormal increased T2 cord signal is eccentric to the left
just above the disc (series 8, image 16). See additional details
below. No definite cord enhancement.

Above the C4-C5 disc and below C6 the visible spinal cord appears
within normal limits. No dural thickening.

Posterior Fossa, vertebral arteries, paraspinal tissues:
Cervicomedullary junction is within normal limits. Brain detailed
separately today. Preserved major vascular flow voids in the neck.
Negative visible neck soft tissues, right lung apex.

Disc levels:

C2-C3: Mild facet hypertrophy. Mild bilateral C3 neural foraminal
stenosis.

C3-C4: Right paracentral disc protrusion (series 8, image 9). Mild
spinal stenosis. Subtle cord mass effect. Additional right eccentric
disc bulging and endplate spurring with mild facet hypertrophy
results in mild left and mild to moderate right C4 foraminal
stenosis.

C4-C5: Mildly lobulated right eccentric circumferential disc bulge
and endplate spurring. Mild spinal stenosis. No cord mass effect.
Mild right C5 foraminal stenosis.

C5-C6: Bulky left paracentral disc extrusion (series 5, image 9 and
series 8, image 19). Moderate to severe spinal stenosis and cord
mass effect with abnormal cord signal as stated above. Additional
disc bulging and endplate spurring at this level. Mild left but
moderate to severe right C6 foraminal stenosis.

C6-C7: Left eccentric circumferential disc bulge and endplate
spurring. No significant spinal stenosis. Mild-to-moderate left C7
neural foraminal stenosis.

C7-T1: Foraminal, far lateral disc bulge and endplate spurring. Mild
ligament flavum hypertrophy. No spinal stenosis. But there is
moderate to severe bilateral C8 neural foraminal stenosis.

Stable visible upper thoracic levels.
IMPRESSION: 1. Bulky degenerative disc extrusion at C5-C6 with severe spinal
stenosis, spinal cord mass effect and abnormal cord signal (edema
and/or myelomalacia). Moderate to severe multifactorial right C6
foraminal stenosis.

2. Smaller disc herniation at C3-C4, and lobulated disc bulging at
C4-C5 with up to mild spinal stenosis at those levels. Up to
moderate degenerative right C4, left C7, and moderate to severe
bilateral C8 neural foraminal stenosis.

3. No metastatic disease identified in the cervical spine.

## 2020-10-18 IMAGING — MR MR HEAD WO/W CM
14 series · 47 of 48 positions shown · IV contrast (gadavist)
Comparison: Cervical spine MRI today reported separately. Head CT
yesterday.

CLINICAL DATA: 34-year-old male with new onset weakness, loss of
bilateral hand grip, weakness in both feet. History of stage III
lymphoma with most recent chemotherapy 2 weeks ago.

EXAM:
MRI HEAD WITHOUT AND WITH CONTRAST
TECHNIQUE: Multiplanar, multiecho pulse sequences of the brain and surrounding
structures were obtained without and with intravenous contrast.
CONTRAST:  7mL GADAVIST GADOBUTROL 1 MMOL/ML IV SOLN

[Series 5: ax dwi_tracew · axial · 3.0mm · 0.65mm/px · z∈[-94,+65]mm · 4 of 50 slices shown]
[im 1/50]
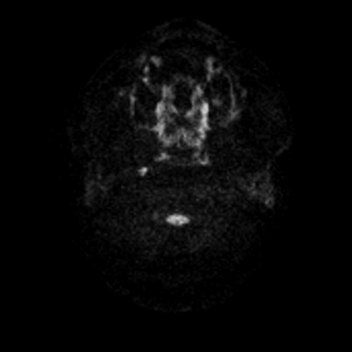
[im 17/50]
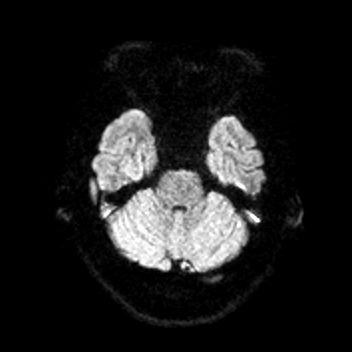
[im 33/50]
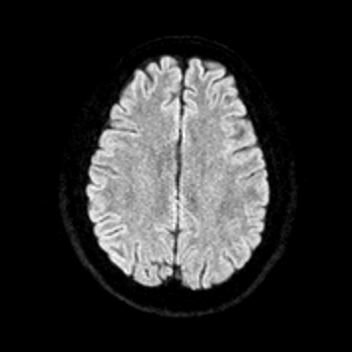
[im 50/50]
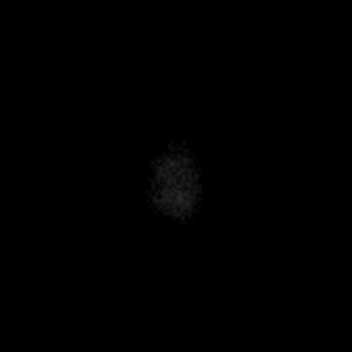

[Series 6: ax dwi_adc · axial · 3.0mm · 0.65mm/px · z∈[-94,+65]mm · 3 of 50 slices shown]
[im 1/50]
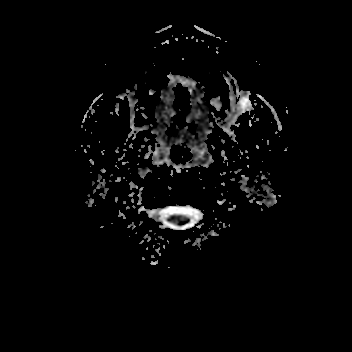
[im 25/50]
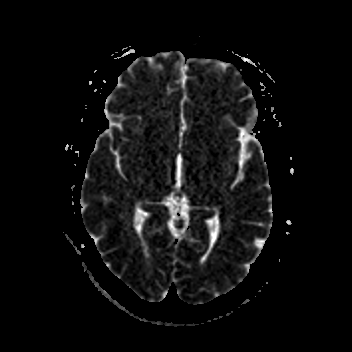
[im 50/50]
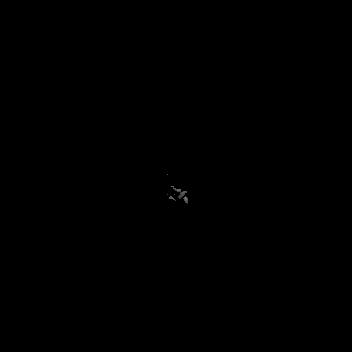

[Series 7: cor dwi_tracew · coronal · 5.0mm · 0.65mm/px · 2 of 40 slices shown]
[im 1/40]
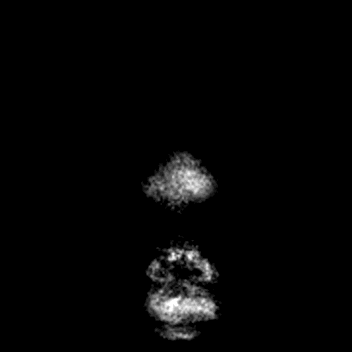
[im 40/40]
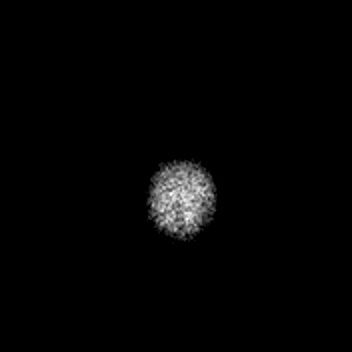

[Series 8: cor dwi_adc · coronal · 5.0mm · 0.65mm/px · 2 of 40 slices shown]
[im 1/40]
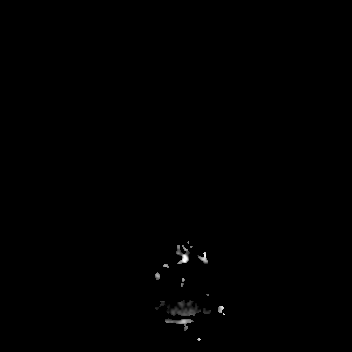
[im 40/40]
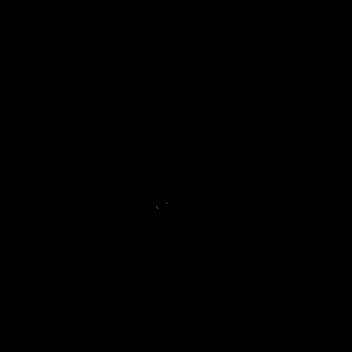

[Series 9: T1 · sagittal · 5.0mm · 0.62mm/px · 1 of 25 slices shown (1 of 2)]
[im 1/25]
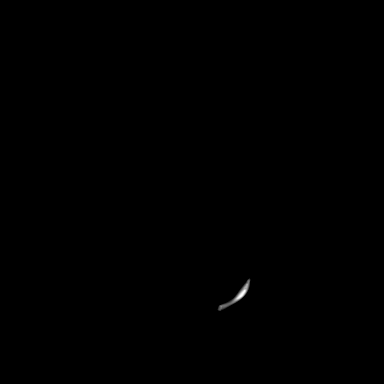

[Series 15: T2 · axial · 5.0mm · 0.53mm/px · z∈[-120,+32]mm · 2 of 27 slices shown]
[im 1/27]
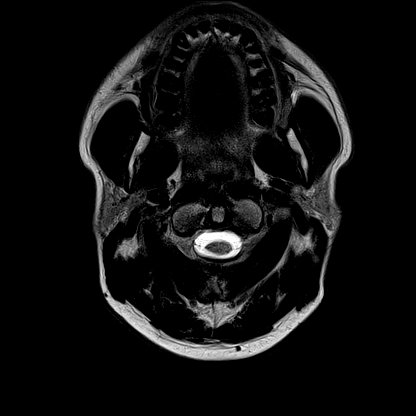
[im 27/27]
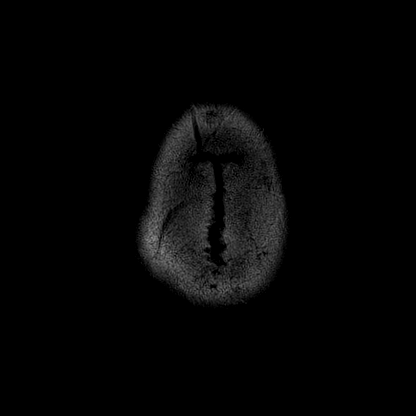

[Series 17: pha_images · axial · 3.0mm · 0.90mm/px · z∈[-130,+40]mm · 3 of 59 slices shown]
[im 1/59]
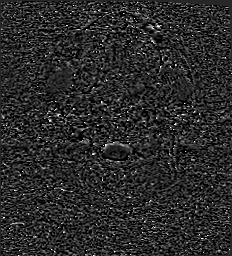
[im 30/59]
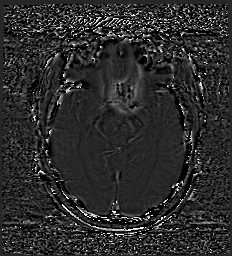
[im 59/59]
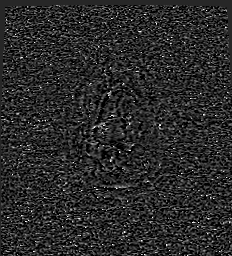

[Series 18: swi_images · axial · 3.0mm · 0.90mm/px · z∈[-130,+43]mm · 3 of 60 slices shown]
[im 1/60]
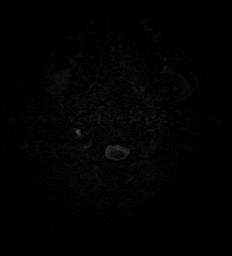
[im 30/60]
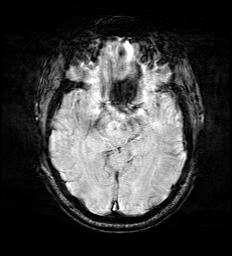
[im 60/60]
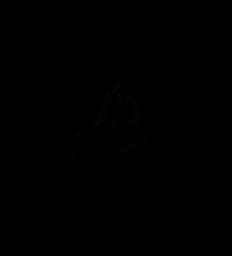

[Series 20: FLAIR · axial · 3.0mm · 0.53mm/px · z∈[-122,+36]mm · 3 of 55 slices shown]
[im 1/55]
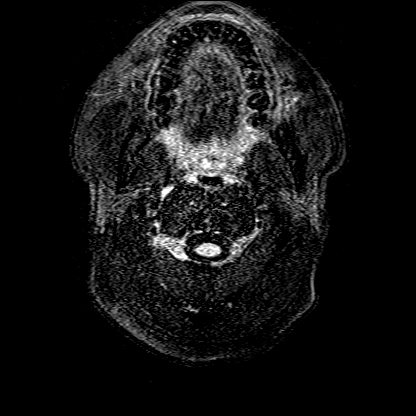
[im 28/55]
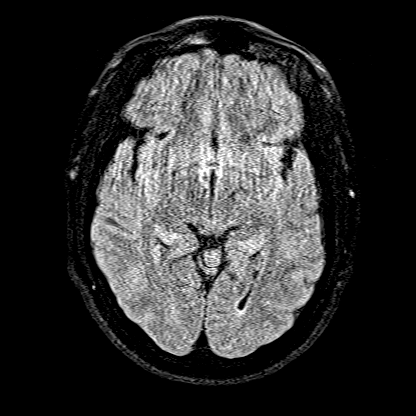
[im 55/55]
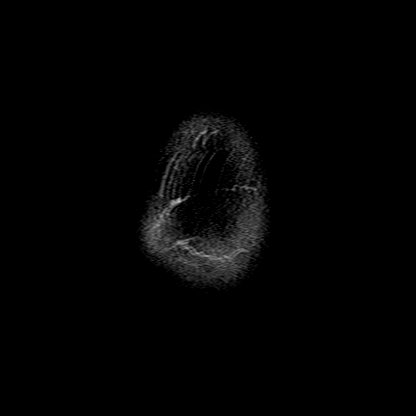

[Series 21: T1 · axial · 1.0mm · 0.98mm/px · z∈[-132,+38]mm · 9 of 175 slices shown (2 of 2)]
[im 1/175]
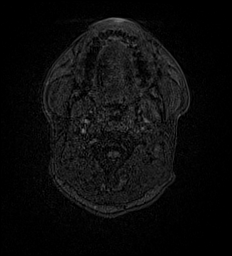
[im 20/175]
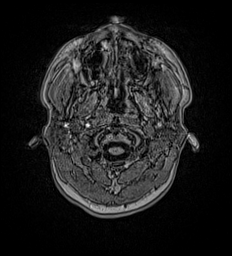
[im 39/175]
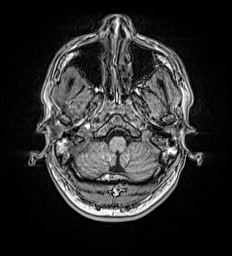
[im 59/175]
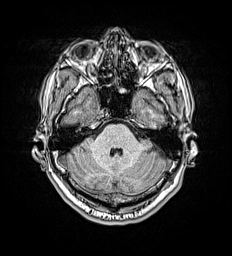
[im 78/175]
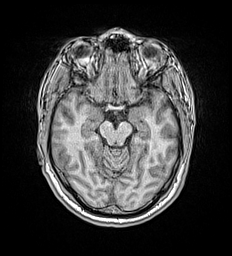
[im 97/175]
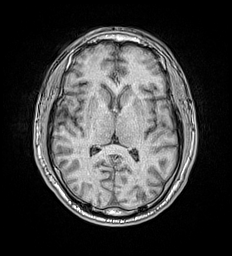
[im 117/175]
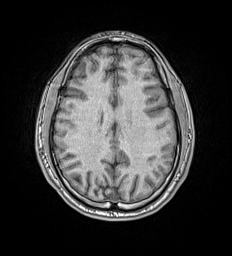
[im 155/175]
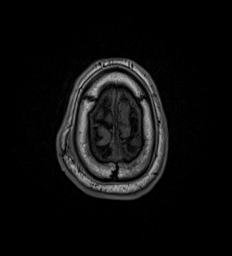
[im 175/175]
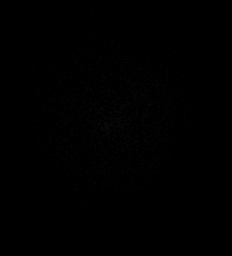

[Series 26: T2 post-contrast · coronal · 5.0mm · 0.57mm/px · 2 of 31 slices shown]
[im 1/31]
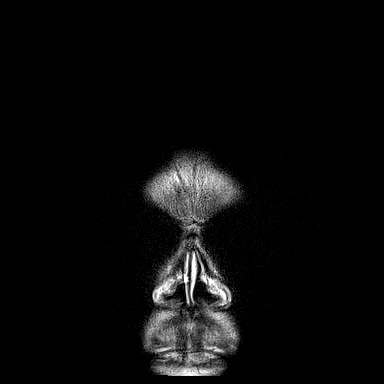
[im 31/31]
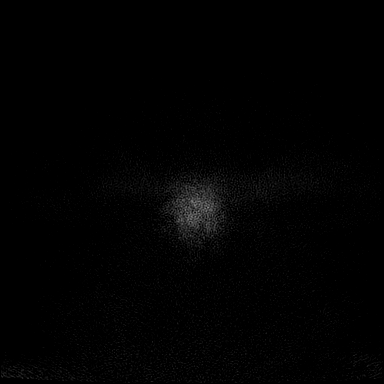

[Series 27: T1 post-contrast · axial · 1.0mm · 0.98mm/px · z∈[+176,+349]mm · 10 of 175 slices shown (1 of 3)]
[im 1/175]
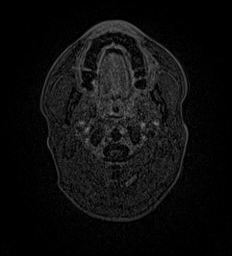
[im 20/175]
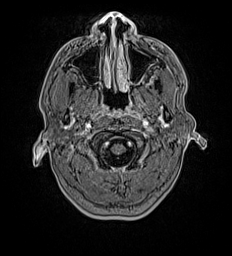
[im 39/175]
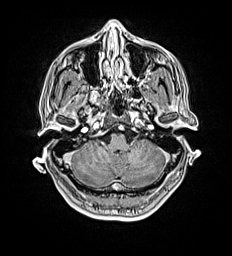
[im 59/175]
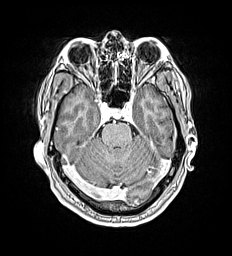
[im 78/175]
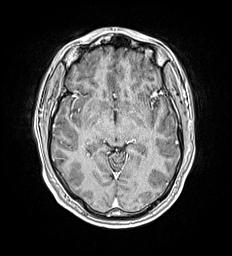
[im 97/175]
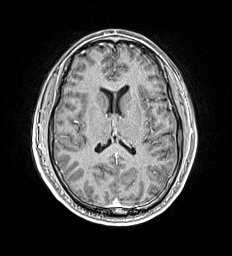
[im 117/175]
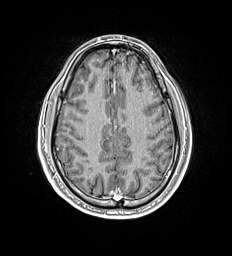
[im 136/175]
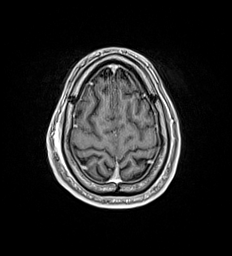
[im 155/175]
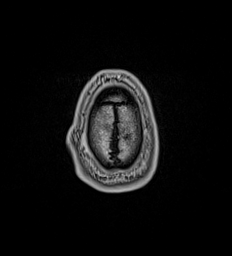
[im 175/175]
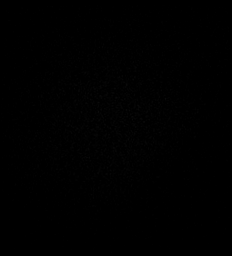

[Series 28: T1 post-contrast · coronal · 5.0mm · 0.57mm/px · 2 of 31 slices shown (2 of 3)]
[im 1/31]
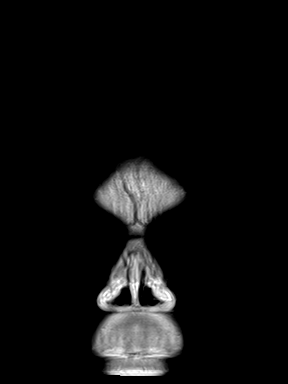
[im 31/31]
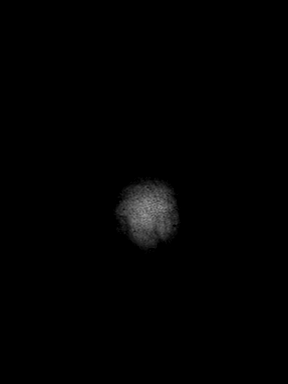

[Series 29: T1 post-contrast · sagittal · 5.0mm · 0.62mm/px · 1 of 23 slices shown (3 of 3)]
[im 1/23]
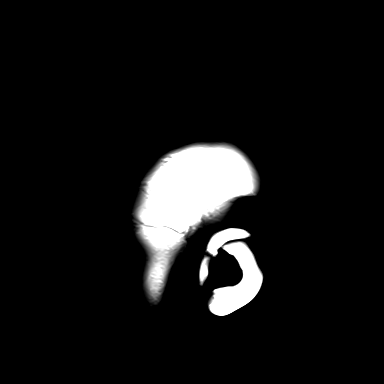

[47 of 48 positions shown; findings below may reference images not displayed]

FINDINGS: Brain: Normal cerebral volume. No restricted diffusion to suggest
acute infarction. No midline shift, mass effect, evidence of mass
lesion, ventriculomegaly, extra-axial collection or acute
intracranial hemorrhage. Cervicomedullary junction and pituitary are
within normal limits.

Axial FLAIR imaging degraded by motion despite repeated imaging
attempts. Otherwise gray and white matter signal is within normal
limits throughout the brain. No encephalomalacia or chronic cerebral
blood products identified.

No abnormal enhancement identified.  No dural thickening.

Vascular: Major intracranial vascular flow voids are preserved. The
major dural venous sinuses are enhancing and appear to be patent.

Skull and upper cervical spine: Cervical spine detailed separately
today. Normal visible bone marrow signal.

Sinuses/Orbits: Negative orbits. Trace paranasal sinus mucosal
thickening.

Other: Mastoids are well aerated. Grossly normal visible internal
auditory structures.

Right superior convexity scalp soft tissue swelling and edema
(series 20, image 48 and series 26, image 12). Normal underlying
calvarium marrow signal. Other scalp and face soft tissues appear
normal.
IMPRESSION: 1. Normal MRI appearance of the brain. No acute intracranial
abnormality.
2. Nonspecific right superior convexity scalp soft tissue swelling
and edema. Query recent trauma/fall.
3. See Cervical Spine MRI reported separately.

## 2020-10-18 MED ORDER — LORAZEPAM 1 MG PO TABS: 1.0000 mg | ORAL_TABLET | ORAL | Status: AC | PRN

## 2020-10-18 MED ORDER — LORAZEPAM 2 MG/ML IJ SOLN: 1.0000 mg | INTRAMUSCULAR | Status: AC | PRN

## 2020-10-18 MED ORDER — FOLIC ACID 1 MG PO TABS
1.0000 mg | ORAL_TABLET | Freq: Every day | ORAL | Status: DC
  Administered 2020-10-18 – 2020-10-21 (×3): 1 mg via ORAL
  Filled 2020-10-18 (×3): qty 1

## 2020-10-18 MED ORDER — THIAMINE HCL 100 MG/ML IJ SOLN
100.0000 mg | Freq: Every day | INTRAMUSCULAR | Status: DC
  Administered 2020-10-19: 09:00:00 100 mg via INTRAVENOUS
  Filled 2020-10-18 (×2): qty 2

## 2020-10-18 MED ORDER — GADOBUTROL 1 MMOL/ML IV SOLN
7.0000 mL | Freq: Once | INTRAVENOUS | Status: AC | PRN
  Administered 2020-10-18: 7 mL via INTRAVENOUS

## 2020-10-18 MED ORDER — THIAMINE HCL 100 MG PO TABS
100.0000 mg | ORAL_TABLET | Freq: Every day | ORAL | Status: DC
  Administered 2020-10-18 – 2020-10-21 (×2): 100 mg via ORAL
  Filled 2020-10-18 (×3): qty 1

## 2020-10-18 MED ORDER — ACETAMINOPHEN 650 MG RE SUPP: 650.0000 mg | Freq: Four times a day (QID) | RECTAL | Status: DC | PRN

## 2020-10-18 MED ORDER — CHLORHEXIDINE GLUCONATE CLOTH 2 % EX PADS
6.0000 | MEDICATED_PAD | Freq: Every day | CUTANEOUS | Status: DC
  Administered 2020-10-18 – 2020-10-21 (×3): 6 via TOPICAL

## 2020-10-18 MED ORDER — MORPHINE SULFATE (PF) 2 MG/ML IV SOLN
2.0000 mg | INTRAVENOUS | Status: DC | PRN
  Administered 2020-10-18 – 2020-10-19 (×3): 2 mg via INTRAVENOUS
  Filled 2020-10-18 (×3): qty 1

## 2020-10-18 MED ORDER — ACETAMINOPHEN 325 MG PO TABS: 650.0000 mg | ORAL_TABLET | Freq: Four times a day (QID) | ORAL | Status: DC | PRN

## 2020-10-18 MED ORDER — LORAZEPAM 2 MG/ML IJ SOLN: 0.0000 mg | Freq: Two times a day (BID) | INTRAMUSCULAR | Status: DC

## 2020-10-18 MED ORDER — OXYCODONE-ACETAMINOPHEN 5-325 MG PO TABS
1.0000 | ORAL_TABLET | ORAL | Status: DC | PRN
  Administered 2020-10-18 – 2020-10-20 (×10): 1 via ORAL
  Filled 2020-10-18 (×10): qty 1

## 2020-10-18 MED ORDER — ONDANSETRON HCL 4 MG/2ML IJ SOLN: 4.0000 mg | Freq: Four times a day (QID) | INTRAMUSCULAR | Status: DC | PRN

## 2020-10-18 MED ORDER — LORAZEPAM 2 MG/ML IJ SOLN: 0.0000 mg | Freq: Four times a day (QID) | INTRAMUSCULAR | Status: AC

## 2020-10-18 MED ORDER — TETANUS-DIPHTH-ACELL PERTUSSIS 5-2.5-18.5 LF-MCG/0.5 IM SUSY
0.5000 mL | PREFILLED_SYRINGE | Freq: Once | INTRAMUSCULAR | Status: AC
  Administered 2020-10-18: 0.5 mL via INTRAMUSCULAR
  Filled 2020-10-18: qty 0.5

## 2020-10-18 MED ORDER — HYDROMORPHONE HCL 1 MG/ML IJ SOLN
0.5000 mg | Freq: Once | INTRAMUSCULAR | Status: AC
Start: 2020-10-18 — End: 2020-10-18
  Administered 2020-10-18: 0.5 mg via INTRAVENOUS
  Filled 2020-10-18: qty 1

## 2020-10-18 MED ORDER — ONDANSETRON HCL 4 MG/2ML IJ SOLN
4.0000 mg | Freq: Once | INTRAMUSCULAR | Status: AC
  Administered 2020-10-18: 4 mg via INTRAVENOUS
  Filled 2020-10-18: qty 2

## 2020-10-18 MED ORDER — ADULT MULTIVITAMIN W/MINERALS CH
1.0000 | ORAL_TABLET | Freq: Every day | ORAL | Status: DC
  Administered 2020-10-18 – 2020-10-21 (×3): 1 via ORAL
  Filled 2020-10-18 (×3): qty 1

## 2020-10-18 MED ORDER — ENOXAPARIN SODIUM 40 MG/0.4ML ~~LOC~~ SOLN
40.0000 mg | SUBCUTANEOUS | Status: DC
  Administered 2020-10-18 – 2020-10-19 (×2): 40 mg via SUBCUTANEOUS
  Filled 2020-10-18 (×2): qty 0.4

## 2020-10-18 MED ORDER — LACTATED RINGERS IV BOLUS
1000.0000 mL | Freq: Once | INTRAVENOUS | Status: AC
  Administered 2020-10-18: 1000 mL via INTRAVENOUS

## 2020-10-18 MED ORDER — ONDANSETRON HCL 4 MG PO TABS: 4.0000 mg | ORAL_TABLET | Freq: Four times a day (QID) | ORAL | Status: DC | PRN

## 2020-10-18 NOTE — Plan of Care (Signed)

## 2020-10-18 NOTE — Evaluation (Addendum)
Occupational Therapy Evaluation Patient Details Name: Preston [name] MRN: [id number] DOB: [birth year] Today's Date: 10/18/2020    History of Present Illness Preston [name] is a 35 y.o. male with medical history significant for Hodgkin's lymphoma on chemotherapy with last treatment 2 weeks prior who presents to the emergency room with complaint of new weakness on his right side that has been worsening, added to weakness on his left side which has been ongoing for the past few weeks.  States he is unable to hold anything in his hands and he feels weak in both his legs.  Reports that for over a month he has been having numbness and tingling in the upper extremities, pain in his neck and back when he coughs or makes a sudden move but it was not thought related to his lymphoma. Per neurosurgery, severe cervical stenosis causing cervical myelopathy and plan for surgical intervention with C5-6 anterior cervical discectomy and fusion.   Clinical Impression   Preston [name] was seen for OT evaluation this date. Prior to hospital admission, pt was Independnet for mobility and I/ADLs including driving. Pt lives in level entry apartment with his girlfriend, reports having aunt/nephew nearby. Pt presents to acute OT demonstrating impaired ADL performance and functional mobility 2/2 decreased functional strength/ROM and pain. Pt currently demonstrates adapted techniques to complete self-feeding/grooming tasks c MOD I (such as using PIP of 2nd & 3rd digits to open milk carton). MIN A for UBD/LBD - assist for buttons and intermittent LLE pain with trunk flexion. Will continue to follow as pt plans for C5-6 anterior cervical discectomy and fusion later this week and is far from his functional baseline. Pt would benefit from skilled OT to address noted impairments and functional limitations (see below for any additional details) in order to maximize safety and independence while minimizing falls risk and caregiver  burden. Upon hospital discharge, recommend Outpatient OT to maximize pt safety and return to PLOF.     Follow Up Recommendations  Outpatient OT    Equipment Recommendations  None recommended by OT    Recommendations for Other Services       Precautions / Restrictions Precautions Precautions: Fall Restrictions Weight Bearing Restrictions: No      Mobility Bed Mobility Overal bed mobility: Independent             General bed mobility comments: Pain with mobility, improved with log roll    Transfers Overall transfer level: Independent                    Balance Overall balance assessment: Mild deficits observed, not formally tested                                         ADL either performed or assessed with clinical judgement   ADL Overall ADL's : Needs assistance/impaired                                       General ADL Comments: Pt uses adapted techniques to complete self-feeding/grooming tasks MOD I such as using PIP of 2nd digits to open milk carton. MIN A for UBD/LBD - assist for buttons and intermittent LLE pain with trunk flexion.                  Pertinent Vitals/Pain Pain Assessment:  0-10 Pain Score: 5  Pain Location: L neck Pain Descriptors / Indicators: Radiating Pain Intervention(s): Limited activity within patient's tolerance;Repositioned     Hand Dominance Right   Extremity/Trunk Assessment Upper Extremity Assessment Upper Extremity Assessment: RUE deficits/detail;LUE deficits/detail RUE Deficits / Details: Grossly 3/5 LUE Deficits / Details: Grossly 2/5 - unable to achieve full fist or digit extension.   Lower Extremity Assessment Lower Extremity Assessment: Generalized weakness       Communication Communication Communication: No difficulties   Cognition Arousal/Alertness: Awake/alert Behavior During Therapy: WFL for tasks assessed/performed Overall Cognitive Status: Within Functional  Limits for tasks assessed                                     General Comments       Exercises Exercises: Other exercises Other Exercises Other Exercises: Pt educated re: OT role, DME recs, d/c recs, falls prevention, ECS Other Exercises: SElf-drinking, grooming, sup<>sit, sit<>stand, sitting/standing balance/tolerance   Shoulder Instructions      Home Living Family/patient expects to be discharged to:: Private residence Living Arrangements: Spouse/significant other (girlfriend) Available Help at Discharge: Family;Available PRN/intermittently (girlfriend works, nephew/aunt live nearby) Type of Home: Apartment Home Access: Level entry     Home Layout: One level     Bathroom Shower/Tub: Aeronautical engineer: None          Prior Functioning/Environment Level of Independence: Independent        Comments: Drives        OT Problem List: Decreased strength;Decreased range of motion;Decreased coordination;Impaired UE functional use      OT Treatment/Interventions: Self-care/ADL training;Therapeutic exercise;Energy conservation;DME and/or AE instruction;Therapeutic activities;Patient/family education;Balance training    OT Goals(Current goals can be found in the care plan section) Acute Rehab OT Goals Patient Stated Goal: To return to PLOF OT Goal Formulation: With patient Time For Goal Achievement: 11/01/20 Potential to Achieve Goals: Good ADL Goals Pt Will Perform Grooming: Independently;standing Pt Will Perform Lower Body Dressing: Independently;sit to/from stand Pt/caregiver will Perform Home Exercise Program: Independently;Both right and left upper extremity;Increased ROM;Increased strength (to improve FMC)  OT Frequency: Min 1X/week    AM-PAC OT "6 Clicks" Daily Activity     Outcome Measure Help from another person eating meals?: None Help from another person taking care of personal grooming?: None Help from another person  toileting, which includes using toliet, bedpan, or urinal?: None Help from another person bathing (including washing, rinsing, drying)?: A Little Help from another person to put on and taking off regular upper body clothing?: A Little Help from another person to put on and taking off regular lower body clothing?: A Little 6 Click Score: 21   End of Session    Activity Tolerance: Patient tolerated treatment well Patient left: in bed;with call bell/phone within reach;with bed alarm set  OT Visit Diagnosis: Muscle weakness (generalized) (M62.81);Other abnormalities of gait and mobility (R26.89)                Time: 1130-1149 OT Time Calculation (min): 19 min Charges:  OT General Charges $OT Visit: 1 Visit OT Evaluation $OT Eval Low Complexity: 1 Low OT Treatments $Self Care/Home Management : 8-22 mins  Dessie Coma, M.S. OTR/L  10/18/20, 3:21 PM  ascom [phone]

## 2020-10-18 NOTE — H&P (Addendum)
History and Physical    Preston [name] UXN:[id number] DOB: [birth year] DOA: 10/17/2020  PCP: Patient, No Pcp Per   Patient coming from: home  I have personally briefly reviewed patient's old medical records in [place]  Chief Complaint: Weakness arms and legs, progressive for weeks  HPI: Preston [name] is a 35 y.o. male with medical history significant for Hodgkin's lymphoma on chemotherapy with last treatment 2 weeks prior who presents to the emergency room with complaint of new weakness on his right side that has been worsening, added to weakness on his left side which has been ongoing for the past few weeks.  States he is unable to hold anything in his hands and he feels weak in both his legs.  Reports that for over a month he has been having numbness and tingling in the upper extremities, pain in his neck and back when he coughs or makes a sudden move but it was not thought related to his lymphoma.  He denies any falls or other injuries. ED Course: On arrival, afebrile with BP 127/95, pulse 89, respirations 18 with O2 sat 97% on room air.  Blood work mostly unremarkable except for mild leukopenia of 3600 and EtOH level of 255. EKG as reviewed by me : Sinus rhythm at 95 Imaging: Extensive MRI imaging of the spine showed no spinal metastases but did show bulky degenerative disc extrusion at C5-C6 with severe cervical spinal stenosis, spinal cord mass-effect and abnormal cord signal as well as other findings  The emergency room provider spoke with neurosurgeon, Dr. Lacinda Axon who  advised against steroids at this time and recommended keeping MAP 75-80. No PT for now.  Hospitalist consulted for admission.  Review of Systems: As per HPI otherwise all other systems on review of systems negative.    Past Medical History:  Diagnosis Date  . Hodgkin's lymphoma ([place])   . Tobacco use     History reviewed. No pertinent surgical history.   reports that he has been smoking. He has a  10.00 pack-year smoking history. He has never used smokeless tobacco. He reports current alcohol use. He reports previous drug use.  No Known Allergies  Family History  Problem Relation Age of Onset  . Heart failure Mother   . Pneumonia Father       Prior to Admission medications   Medication Sig Start Date End Date Taking? Authorizing Provider  celecoxib (CELEBREX) 100 MG capsule Take 1 capsule (100 mg total) by mouth 2 (two) times daily. 09/15/20   Borders, Kirt Boys, NP  lidocaine-prilocaine (EMLA) cream Apply 1 application topically as needed. 06/26/20   Earlie Server, MD  omeprazole (PRILOSEC) 20 MG capsule Take 1 capsule (20 mg total) by mouth daily. 09/15/20   Borders, Kirt Boys, NP  ondansetron (ZOFRAN) 8 MG tablet Take 1 tablet (8 mg total) by mouth 2 (two) times daily. 06/26/20   Earlie Server, MD  oxyCODONE-acetaminophen (PERCOCET) 5-325 MG tablet Take 1 tablet by mouth every 4 (four) hours as needed for severe pain. Patient not taking: No sig reported 08/07/20 08/07/21  Blake Divine, MD  prochlorperazine (COMPAZINE) 10 MG tablet Take 1 tablet (10 mg total) by mouth every 6 (six) hours as needed for nausea or vomiting. 06/26/20   Earlie Server, MD    Physical Exam: Vitals:   10/17/20 2322 10/18/20 0000 10/18/20 0115 10/18/20 0423  BP:  (!) 151/96  131/81  Pulse:  97 88 84  Resp:  19 20 19   Temp:  TempSrc:      SpO2:  98% 97% 99%  Weight: 77.1 kg     Height: 5\' 10"  (1.778 m)        Vitals:   10/17/20 2322 10/18/20 0000 10/18/20 0115 10/18/20 0423  BP:  (!) 151/96  131/81  Pulse:  97 88 84  Resp:  19 20 19   Temp:      TempSrc:      SpO2:  98% 97% 99%  Weight: 77.1 kg     Height: 5\' 10"  (1.778 m)         Constitutional: Alert and oriented x 3 . Not in any apparent distress HEENT:      Head: Normocephalic and atraumatic.         Eyes: PERLA, EOMI, Conjunctivae are normal. Sclera is non-icteric.       Mouth/Throat: Mucous membranes are moist.       Neck: Supple with no  signs of meningismus. Cardiovascular: Regular rate and rhythm. No murmurs, gallops, or rubs. 2+ symmetrical distal pulses are present . No JVD. No LE edema Respiratory: Respiratory effort normal .Lungs sounds clear bilaterally. No wheezes, crackles, or rhonchi.  Gastrointestinal: Soft, non tender, and non distended with positive bowel sounds.  Genitourinary: No CVA tenderness. Musculoskeletal: Nontender with normal range of motion in all extremities. No cyanosis, or erythema of extremities. Neurologic:  Face is symmetric. Moving all extremities.  Weak grip strength of hands right greater than left.  Weakness lower extremities Skin: Skin is warm, dry.  Multiple scratch marks seen on right ear and on upper arms Psychiatric: Mood and affect are normal    Labs on Admission: I have personally reviewed following labs and imaging studies  CBC: Recent Labs  Lab 10/17/20 2325  WBC 3.6*  NEUTROABS 1.4*  HGB 14.6  HCT 40.0  MCV 87.7  PLT 619   Basic Metabolic Panel: Recent Labs  Lab 10/17/20 2325  NA 138  K 3.5  CL 101  CO2 21*  GLUCOSE 88  BUN 6  CREATININE 0.67  CALCIUM 9.5  MG 2.3   GFR: Estimated Creatinine Clearance: 131.8 mL/min (by C-G formula based on SCr of 0.67 mg/dL). Liver Function Tests: Recent Labs  Lab 10/17/20 2325  AST 40  ALT 39  ALKPHOS 54  BILITOT 0.7  PROT 8.6*  ALBUMIN 4.9   No results for input(s): LIPASE, AMYLASE in the last 168 hours. No results for input(s): AMMONIA in the last 168 hours. Coagulation Profile: Recent Labs  Lab 10/17/20 2325  INR 1.0   Cardiac Enzymes: No results for input(s): CKTOTAL, CKMB, CKMBINDEX, TROPONINI in the last 168 hours. BNP (last 3 results) No results for input(s): PROBNP in the last 8760 hours. HbA1C: No results for input(s): HGBA1C in the last 72 hours. CBG: No results for input(s): GLUCAP in the last 168 hours. Lipid Profile: No results for input(s): CHOL, HDL, LDLCALC, TRIG, CHOLHDL, LDLDIRECT in the  last 72 hours. Thyroid Function Tests: No results for input(s): TSH, T4TOTAL, FREET4, T3FREE, THYROIDAB in the last 72 hours. Anemia Panel: No results for input(s): VITAMINB12, FOLATE, FERRITIN, TIBC, IRON, RETICCTPCT in the last 72 hours. Urine analysis: No results found for: COLORURINE, APPEARANCEUR, [place], [place], [place], [place], [place], [place], [place], [place], [place], LEUKOCYTESUR  Radiological Exams on Admission: DG Chest 2 View  Result Date: 10/18/2020 CLINICAL DATA:  Left shoulder pain, generalized weakness, lymphoma EXAM: CHEST - 2 VIEW COMPARISON:  08/07/2020, CT 08/07/2020 FINDINGS: Lungs are clear. No pneumothorax or pleural effusion. Right paratracheal  and suprahilar soft tissue thickening is unchanged, better appreciated on prior CT examination and related to the anterior mediastinal mass. Left upper extremity PICC line tip noted within the superior vena cava. Cardiac size within normal limits. Pulmonary vascularity is normal. No acute bone abnormality. IMPRESSION: Unchanged anterior mediastinal mass. No radiographic evidence of acute cardiopulmonary disease. Electronically Signed   By: Fidela Salisbury MD   On: 10/18/2020 00:42   CT Head Wo Contrast  Result Date: 10/17/2020 CLINICAL DATA:  Neurologic deficit, lymphoma currently undergoing chemotherapy, bilateral hand and foot weakness EXAM: CT HEAD WITHOUT CONTRAST TECHNIQUE: Contiguous axial images were obtained from the base of the skull through the vertex without intravenous contrast. COMPARISON:  08/23/2020 FINDINGS: Brain: No acute infarct or hemorrhage. Lateral ventricles and midline structures are unremarkable. No acute extra-axial fluid collections. No mass effect. Vascular: No hyperdense vessel or unexpected calcification. Skull: Normal. Negative for fracture or focal lesion. Sinuses/Orbits: No acute finding. Other: None. IMPRESSION: 1. No acute intracranial process. Electronically Signed   By: Randa Ngo  M.D.   On: 10/17/2020 23:56   MR Brain W and Wo Contrast  Result Date: 10/18/2020 CLINICAL DATA:  35 year old male with new onset weakness, loss of bilateral hand grip, weakness in both feet. History of stage III lymphoma with most recent chemotherapy 2 weeks ago. EXAM: MRI HEAD WITHOUT AND WITH CONTRAST TECHNIQUE: Multiplanar, multiecho pulse sequences of the brain and surrounding structures were obtained without and with intravenous contrast. CONTRAST:  69mL GADAVIST GADOBUTROL 1 MMOL/ML IV SOLN COMPARISON:  Cervical spine MRI today reported separately. Head CT yesterday. FINDINGS: Brain: Normal cerebral volume. No restricted diffusion to suggest acute infarction. No midline shift, mass effect, evidence of mass lesion, ventriculomegaly, extra-axial collection or acute intracranial hemorrhage. Cervicomedullary junction and pituitary are within normal limits. Axial FLAIR imaging degraded by motion despite repeated imaging attempts. Otherwise gray and white matter signal is within normal limits throughout the brain. No encephalomalacia or chronic cerebral blood products identified. No abnormal enhancement identified.  No dural thickening. Vascular: Major intracranial vascular flow voids are preserved. The major dural venous sinuses are enhancing and appear to be patent. Skull and upper cervical spine: Cervical spine detailed separately today. Normal visible bone marrow signal. Sinuses/Orbits: Negative orbits. Trace paranasal sinus mucosal thickening. Other: Mastoids are well aerated. Grossly normal visible internal auditory structures. Right superior convexity scalp soft tissue swelling and edema (series 20, image 48 and series 26, image 12). Normal underlying calvarium marrow signal. Other scalp and face soft tissues appear normal. IMPRESSION: 1. Normal MRI appearance of the brain. No acute intracranial abnormality. 2. Nonspecific right superior convexity scalp soft tissue swelling and edema. Query recent  trauma/fall. 3. See Cervical Spine MRI reported separately. Electronically Signed   By: Genevie Ann M.D.   On: 10/18/2020 04:26   MR Cervical Spine W or Wo Contrast  Addendum Date: 10/18/2020   ADDENDUM REPORT: 10/18/2020 04:51 ADDENDUM: Study discussed by telephone with Dr. Hulan Saas on 10/18/2020 at 0447 hours. He is already in discussion with Neurosurgery. Electronically Signed   By: Genevie Ann M.D.   On: 10/18/2020 04:51   Result Date: 10/18/2020 CLINICAL DATA:  35 year old male with new onset weakness, loss of bilateral hand grip, weakness in both feet. History of stage III lymphoma with most recent chemotherapy 2 weeks ago. EXAM: MRI CERVICAL SPINE WITHOUT AND WITH CONTRAST TECHNIQUE: Multiplanar and multiecho pulse sequences of the cervical spine, to include the craniocervical junction and cervicothoracic junction, were obtained without and with  intravenous contrast. CONTRAST:  79mL GADAVIST GADOBUTROL 1 MMOL/ML IV SOLN in conjunction with contrast enhanced imaging of the brain reported separately. COMPARISON:  Brain MRI today reported separately. Thoracic and lumbar MRI 0302 hours today. FINDINGS: Alignment: Straightening and mild reversal of cervical lordosis. No significant spondylolisthesis. Vertebrae: Minimal degenerative endplate marrow edema at C5-C6. Normal background bone marrow signal, with no suspicious osseous lesion. Cord: Abnormal. Bulky disc extrusion at C5-C6 results in mass effect on the spinal cord and abnormal central cord signal (series 7, image 9). The abnormal increased T2 cord signal is eccentric to the left just above the disc (series 8, image 16). See additional details below. No definite cord enhancement. Above the C4-C5 disc and below C6 the visible spinal cord appears within normal limits. No dural thickening. Posterior Fossa, vertebral arteries, paraspinal tissues: Cervicomedullary junction is within normal limits. Brain detailed separately today. Preserved major vascular flow voids  in the neck. Negative visible neck soft tissues, right lung apex. Disc levels: C2-C3: Mild facet hypertrophy. Mild bilateral C3 neural foraminal stenosis. C3-C4: Right paracentral disc protrusion (series 8, image 9). Mild spinal stenosis. Subtle cord mass effect. Additional right eccentric disc bulging and endplate spurring with mild facet hypertrophy results in mild left and mild to moderate right C4 foraminal stenosis. C4-C5: Mildly lobulated right eccentric circumferential disc bulge and endplate spurring. Mild spinal stenosis. No cord mass effect. Mild right C5 foraminal stenosis. C5-C6: Bulky left paracentral disc extrusion (series 5, image 9 and series 8, image 19). Moderate to severe spinal stenosis and cord mass effect with abnormal cord signal as stated above. Additional disc bulging and endplate spurring at this level. Mild left but moderate to severe right C6 foraminal stenosis. C6-C7: Left eccentric circumferential disc bulge and endplate spurring. No significant spinal stenosis. Mild-to-moderate left C7 neural foraminal stenosis. C7-T1: Foraminal, far lateral disc bulge and endplate spurring. Mild ligament flavum hypertrophy. No spinal stenosis. But there is moderate to severe bilateral C8 neural foraminal stenosis. Stable visible upper thoracic levels. IMPRESSION: 1. Bulky degenerative disc extrusion at C5-C6 with severe spinal stenosis, spinal cord mass effect and abnormal cord signal (edema and/or myelomalacia). Moderate to severe multifactorial right C6 foraminal stenosis. 2. Smaller disc herniation at C3-C4, and lobulated disc bulging at C4-C5 with up to mild spinal stenosis at those levels. Up to moderate degenerative right C4, left C7, and moderate to severe bilateral C8 neural foraminal stenosis. 3. No metastatic disease identified in the cervical spine. Electronically Signed: By: Genevie Ann M.D. On: 10/18/2020 04:34   MR THORACIC SPINE W WO CONTRAST  Result Date: 10/18/2020 CLINICAL DATA:   Stage 3 lymphoma.  New onset weakness. EXAM: MRI THORACIC AND LUMBAR SPINE WITHOUT AND WITH CONTRAST TECHNIQUE: Multiplanar and multiecho pulse sequences of the thoracic and lumbar spine were obtained without and with intravenous contrast. CONTRAST:  20mL GADAVIST GADOBUTROL 1 MMOL/ML IV SOLN COMPARISON:  None. FINDINGS: MRI THORACIC SPINE FINDINGS Alignment:  Physiologic. Vertebrae: No fracture, evidence of discitis, or bone lesion. Cord: Normal signal and morphology. No abnormal contrast enhancement. Paraspinal and other soft tissues: Large amount of anterior mediastinal lymphadenopathy, incompletely visualized. Disc levels: No spinal canal stenosis. MRI LUMBAR SPINE FINDINGS Segmentation:  Standard. Alignment:  Physiologic. Vertebrae:  No fracture, evidence of discitis, or bone lesion. Conus medullaris: Extends to the L1 level and appears normal. No abnormal contrast enhancement. Paraspinal and other soft tissues: Negative. Disc levels: Small left L5-S1 extraforaminal disc protrusion. No spinal canal or neural foraminal stenosis. IMPRESSION: 1. No evidence  of metastatic disease to the thoracic or lumbar spine. 2. Partially visualized anterior mediastinal lymphadenopathy, consistent with lymphoma. 3. Small left L5-S1 extraforaminal disc protrusion could irritate the left L5 nerve root. Electronically Signed   By: Ulyses Jarred M.D.   On: 10/18/2020 04:00   MR Lumbar Spine W Wo Contrast  Result Date: 10/18/2020 CLINICAL DATA:  Stage 3 lymphoma.  New onset weakness. EXAM: MRI THORACIC AND LUMBAR SPINE WITHOUT AND WITH CONTRAST TECHNIQUE: Multiplanar and multiecho pulse sequences of the thoracic and lumbar spine were obtained without and with intravenous contrast. CONTRAST:  33mL GADAVIST GADOBUTROL 1 MMOL/ML IV SOLN COMPARISON:  None. FINDINGS: MRI THORACIC SPINE FINDINGS Alignment:  Physiologic. Vertebrae: No fracture, evidence of discitis, or bone lesion. Cord: Normal signal and morphology. No abnormal contrast  enhancement. Paraspinal and other soft tissues: Large amount of anterior mediastinal lymphadenopathy, incompletely visualized. Disc levels: No spinal canal stenosis. MRI LUMBAR SPINE FINDINGS Segmentation:  Standard. Alignment:  Physiologic. Vertebrae:  No fracture, evidence of discitis, or bone lesion. Conus medullaris: Extends to the L1 level and appears normal. No abnormal contrast enhancement. Paraspinal and other soft tissues: Negative. Disc levels: Small left L5-S1 extraforaminal disc protrusion. No spinal canal or neural foraminal stenosis. IMPRESSION: 1. No evidence of metastatic disease to the thoracic or lumbar spine. 2. Partially visualized anterior mediastinal lymphadenopathy, consistent with lymphoma. 3. Small left L5-S1 extraforaminal disc protrusion could irritate the left L5 nerve root. Electronically Signed   By: Ulyses Jarred M.D.   On: 10/18/2020 04:00     Assessment/Plan 35 year old male with history of Hodgkin's lymphoma on chemotherapy with last treatment 2 weeks prior who presenting with weakness both upper and lower extremities.     Spinal stenosis of cervical region, severe   Limb weakness, acute on subacute -Patient presenting with weakness in both upper and lower extremities -MRI C-spine-significant for "Bulky degenerative disc extrusion at C5-C6 with severe spinal stenosis, spinal cord mass effect and abnormal cord signal" among other findings -ED provider spoke with neurosurgeon Dr. Lacinda Axon who recommended keeping MAP between 75 and 80.  No steroids for now. No PT for now -Neurosurgery consult to follow -Pain management with    Malignant lymphoma, Hodgkin's type ([facility])   Leukopenia -On chemotherapy, last treatment 2 weeks prior with mild leukopenia -No metastases seen on extensive MRI imaging of the spine    Elevated blood alcohol level -EtOH level over 200 -Monitor for withdrawal  Excoriation right ear and multiple sites on upper extremities -Patient states his  puppy scratched him -Wound care -Received tetanus shot in the ER    DVT prophylaxis: Lovenox  Code Status: full code  Family Communication:  none  Disposition Plan: Back to previous home environment Consults called: Neurosurgery Status: Observation    Athena Masse MD [facility]     10/18/2020, 5:15 AM

## 2020-10-18 NOTE — Progress Notes (Signed)
[facility] PROGRESS NOTE    Progress Note  Preston [name]  YNW:[id number] DOB: [birth year] DOA: 10/17/2020 PCP: Patient, No Pcp Per     Brief Narrative:   Preston [name] is an 35 y.o. male past medical history of Hodgkin's lymphoma with chemotherapy about 2 weeks prior to admission continue complaining of weakness on his right side which has been worsening.  Neurosurgery insulted who recommended surgical intervention.  Significant studies: 10/18/2020  MRI of the C-spine degenerative disc extrusion at C5-C6 with severe spinal stenosis and spinal cord mass-effect abnormal cord signaling with moderate to severe multifactorial right foraminal stenosis  Antibiotics: None  Microbiology data: Blood culture:  Procedures: None  Assessment/Plan:   Spinal stenosis of cervical region, severe: Patient presented with upper and lower extremity weakness with MRI showed disc extrusion at C5-C6 with severe spinal stenosis. Neurosurgery was consulted recommended surgical intervention. His Gupta score calculator for cardiopulmonary complications is very low (<1%). The patient has conceded to the risk and benefits of proceeding with surgical intervention.  Malignant lymphoma, Hodgkin's type [facility]): Missed his chemotherapy on 10/17/2020 last chemotherapy [facility] more than 2 weeks ago. No metastatic lesions seen on MRI.  Elevated blood alcohol level Alcohol level 200, will continue to monitor with CIWA protocol.  Excoriation of multiple sites Wound care has been consulted he related to to his puppy.    DVT prophylaxis: lovenox Family Communication:none Status is: Observation  The patient will require care spanning > 2 midnights and should be moved to inpatient because: Hemodynamically unstable  Dispo: The patient is from: Home              Anticipated d/c is to: Home              Patient currently is not medically stable to d/c.   Difficult to place patient No     Code  Status:     Code Status Orders  (From admission, onward)         Start     Ordered   10/18/20 0515  Full code  Continuous        10/18/20 0515        Code Status History    This patient has a current code status but no historical code status.   Advance Care Planning Activity        IV Access:    Peripheral IV   Procedures and diagnostic studies:   DG Chest 2 View  Result Date: 10/18/2020 CLINICAL DATA:  Left shoulder pain, generalized weakness, lymphoma EXAM: CHEST - 2 VIEW COMPARISON:  08/07/2020, CT 08/07/2020 FINDINGS: Lungs are clear. No pneumothorax or pleural effusion. Right paratracheal and suprahilar soft tissue thickening is unchanged, better appreciated on prior CT examination and related to the anterior mediastinal mass. Left upper extremity PICC line tip noted within the superior vena cava. Cardiac size within normal limits. Pulmonary vascularity is normal. No acute bone abnormality. IMPRESSION: Unchanged anterior mediastinal mass. No radiographic evidence of acute cardiopulmonary disease. Electronically Signed   By: Fidela Salisbury MD   On: 10/18/2020 00:42   CT Head Wo Contrast  Result Date: 10/17/2020 CLINICAL DATA:  Neurologic deficit, lymphoma currently undergoing chemotherapy, bilateral hand and foot weakness EXAM: CT HEAD WITHOUT CONTRAST TECHNIQUE: Contiguous axial images were obtained from the base of the skull through the vertex without intravenous contrast. COMPARISON:  08/23/2020 FINDINGS: Brain: No acute infarct or hemorrhage. Lateral ventricles and midline structures are unremarkable. No acute extra-axial fluid collections. No mass effect. Vascular: No  hyperdense vessel or unexpected calcification. Skull: Normal. Negative for fracture or focal lesion. Sinuses/Orbits: No acute finding. Other: None. IMPRESSION: 1. No acute intracranial process. Electronically Signed   By: Randa Ngo M.D.   On: 10/17/2020 23:56   MR Brain W and Wo Contrast  Result Date:  10/18/2020 CLINICAL DATA:  35 year old male with new onset weakness, loss of bilateral hand grip, weakness in both feet. History of stage III lymphoma with most recent chemotherapy 2 weeks ago. EXAM: MRI HEAD WITHOUT AND WITH CONTRAST TECHNIQUE: Multiplanar, multiecho pulse sequences of the brain and surrounding structures were obtained without and with intravenous contrast. CONTRAST:  63mL GADAVIST GADOBUTROL 1 MMOL/ML IV SOLN COMPARISON:  Cervical spine MRI today reported separately. Head CT yesterday. FINDINGS: Brain: Normal cerebral volume. No restricted diffusion to suggest acute infarction. No midline shift, mass effect, evidence of mass lesion, ventriculomegaly, extra-axial collection or acute intracranial hemorrhage. Cervicomedullary junction and pituitary are within normal limits. Axial FLAIR imaging degraded by motion despite repeated imaging attempts. Otherwise gray and white matter signal is within normal limits throughout the brain. No encephalomalacia or chronic cerebral blood products identified. No abnormal enhancement identified.  No dural thickening. Vascular: Major intracranial vascular flow voids are preserved. The major dural venous sinuses are enhancing and appear to be patent. Skull and upper cervical spine: Cervical spine detailed separately today. Normal visible bone marrow signal. Sinuses/Orbits: Negative orbits. Trace paranasal sinus mucosal thickening. Other: Mastoids are well aerated. Grossly normal visible internal auditory structures. Right superior convexity scalp soft tissue swelling and edema (series 20, image 48 and series 26, image 12). Normal underlying calvarium marrow signal. Other scalp and face soft tissues appear normal. IMPRESSION: 1. Normal MRI appearance of the brain. No acute intracranial abnormality. 2. Nonspecific right superior convexity scalp soft tissue swelling and edema. Query recent trauma/fall. 3. See Cervical Spine MRI reported separately. Electronically Signed    By: Genevie Ann M.D.   On: 10/18/2020 04:26   MR Cervical Spine W or Wo Contrast  Addendum Date: 10/18/2020   ADDENDUM REPORT: 10/18/2020 04:51 ADDENDUM: Study discussed by telephone with Dr. Hulan Saas on 10/18/2020 at 0447 hours. He is already in discussion with Neurosurgery. Electronically Signed   By: Genevie Ann M.D.   On: 10/18/2020 04:51   Result Date: 10/18/2020 CLINICAL DATA:  35 year old male with new onset weakness, loss of bilateral hand grip, weakness in both feet. History of stage III lymphoma with most recent chemotherapy 2 weeks ago. EXAM: MRI CERVICAL SPINE WITHOUT AND WITH CONTRAST TECHNIQUE: Multiplanar and multiecho pulse sequences of the cervical spine, to include the craniocervical junction and cervicothoracic junction, were obtained without and with intravenous contrast. CONTRAST:  75mL GADAVIST GADOBUTROL 1 MMOL/ML IV SOLN in conjunction with contrast enhanced imaging of the brain reported separately. COMPARISON:  Brain MRI today reported separately. Thoracic and lumbar MRI 0302 hours today. FINDINGS: Alignment: Straightening and mild reversal of cervical lordosis. No significant spondylolisthesis. Vertebrae: Minimal degenerative endplate marrow edema at C5-C6. Normal background bone marrow signal, with no suspicious osseous lesion. Cord: Abnormal. Bulky disc extrusion at C5-C6 results in mass effect on the spinal cord and abnormal central cord signal (series 7, image 9). The abnormal increased T2 cord signal is eccentric to the left just above the disc (series 8, image 16). See additional details below. No definite cord enhancement. Above the C4-C5 disc and below C6 the visible spinal cord appears within normal limits. No dural thickening. Posterior Fossa, vertebral arteries, paraspinal tissues: Cervicomedullary junction is within  normal limits. Brain detailed separately today. Preserved major vascular flow voids in the neck. Negative visible neck soft tissues, right lung apex. Disc levels:  C2-C3: Mild facet hypertrophy. Mild bilateral C3 neural foraminal stenosis. C3-C4: Right paracentral disc protrusion (series 8, image 9). Mild spinal stenosis. Subtle cord mass effect. Additional right eccentric disc bulging and endplate spurring with mild facet hypertrophy results in mild left and mild to moderate right C4 foraminal stenosis. C4-C5: Mildly lobulated right eccentric circumferential disc bulge and endplate spurring. Mild spinal stenosis. No cord mass effect. Mild right C5 foraminal stenosis. C5-C6: Bulky left paracentral disc extrusion (series 5, image 9 and series 8, image 19). Moderate to severe spinal stenosis and cord mass effect with abnormal cord signal as stated above. Additional disc bulging and endplate spurring at this level. Mild left but moderate to severe right C6 foraminal stenosis. C6-C7: Left eccentric circumferential disc bulge and endplate spurring. No significant spinal stenosis. Mild-to-moderate left C7 neural foraminal stenosis. C7-T1: Foraminal, far lateral disc bulge and endplate spurring. Mild ligament flavum hypertrophy. No spinal stenosis. But there is moderate to severe bilateral C8 neural foraminal stenosis. Stable visible upper thoracic levels. IMPRESSION: 1. Bulky degenerative disc extrusion at C5-C6 with severe spinal stenosis, spinal cord mass effect and abnormal cord signal (edema and/or myelomalacia). Moderate to severe multifactorial right C6 foraminal stenosis. 2. Smaller disc herniation at C3-C4, and lobulated disc bulging at C4-C5 with up to mild spinal stenosis at those levels. Up to moderate degenerative right C4, left C7, and moderate to severe bilateral C8 neural foraminal stenosis. 3. No metastatic disease identified in the cervical spine. Electronically Signed: By: Genevie Ann M.D. On: 10/18/2020 04:34   MR THORACIC SPINE W WO CONTRAST  Result Date: 10/18/2020 CLINICAL DATA:  Stage 3 lymphoma.  New onset weakness. EXAM: MRI THORACIC AND LUMBAR SPINE WITHOUT  AND WITH CONTRAST TECHNIQUE: Multiplanar and multiecho pulse sequences of the thoracic and lumbar spine were obtained without and with intravenous contrast. CONTRAST:  53mL GADAVIST GADOBUTROL 1 MMOL/ML IV SOLN COMPARISON:  None. FINDINGS: MRI THORACIC SPINE FINDINGS Alignment:  Physiologic. Vertebrae: No fracture, evidence of discitis, or bone lesion. Cord: Normal signal and morphology. No abnormal contrast enhancement. Paraspinal and other soft tissues: Large amount of anterior mediastinal lymphadenopathy, incompletely visualized. Disc levels: No spinal canal stenosis. MRI LUMBAR SPINE FINDINGS Segmentation:  Standard. Alignment:  Physiologic. Vertebrae:  No fracture, evidence of discitis, or bone lesion. Conus medullaris: Extends to the L1 level and appears normal. No abnormal contrast enhancement. Paraspinal and other soft tissues: Negative. Disc levels: Small left L5-S1 extraforaminal disc protrusion. No spinal canal or neural foraminal stenosis. IMPRESSION: 1. No evidence of metastatic disease to the thoracic or lumbar spine. 2. Partially visualized anterior mediastinal lymphadenopathy, consistent with lymphoma. 3. Small left L5-S1 extraforaminal disc protrusion could irritate the left L5 nerve root. Electronically Signed   By: Ulyses Jarred M.D.   On: 10/18/2020 04:00   MR Lumbar Spine W Wo Contrast  Result Date: 10/18/2020 CLINICAL DATA:  Stage 3 lymphoma.  New onset weakness. EXAM: MRI THORACIC AND LUMBAR SPINE WITHOUT AND WITH CONTRAST TECHNIQUE: Multiplanar and multiecho pulse sequences of the thoracic and lumbar spine were obtained without and with intravenous contrast. CONTRAST:  51mL GADAVIST GADOBUTROL 1 MMOL/ML IV SOLN COMPARISON:  None. FINDINGS: MRI THORACIC SPINE FINDINGS Alignment:  Physiologic. Vertebrae: No fracture, evidence of discitis, or bone lesion. Cord: Normal signal and morphology. No abnormal contrast enhancement. Paraspinal and other soft tissues: Large amount of anterior mediastinal  lymphadenopathy, incompletely visualized. Disc levels: No spinal canal stenosis. MRI LUMBAR SPINE FINDINGS Segmentation:  Standard. Alignment:  Physiologic. Vertebrae:  No fracture, evidence of discitis, or bone lesion. Conus medullaris: Extends to the L1 level and appears normal. No abnormal contrast enhancement. Paraspinal and other soft tissues: Negative. Disc levels: Small left L5-S1 extraforaminal disc protrusion. No spinal canal or neural foraminal stenosis. IMPRESSION: 1. No evidence of metastatic disease to the thoracic or lumbar spine. 2. Partially visualized anterior mediastinal lymphadenopathy, consistent with lymphoma. 3. Small left L5-S1 extraforaminal disc protrusion could irritate the left L5 nerve root. Electronically Signed   By: Ulyses Jarred M.D.   On: 10/18/2020 04:00     Medical Consultants:    None.   Subjective:    Preston [name] he denies any pain relates this has been going on for 3 months.  Objective:    Vitals:   10/18/20 0000 10/18/20 0115 10/18/20 0423 10/18/20 0758  BP: (!) 151/96  131/81 124/81  Pulse: 97 88 84 84  Resp: 19 20 19 16   Temp:    98 F (36.7 C)  TempSrc:      SpO2: 98% 97% 99% 98%  Weight:      Height:       SpO2: 98 %   Intake/Output Summary (Last 24 hours) at 10/18/2020 0931 Last data filed at 10/18/2020 0255 Gross per 24 hour  Intake 1000 ml  Output --  Net 1000 ml   Filed Weights   10/17/20 2322  Weight: 77.1 kg    Exam: General exam: In no acute distress. Respiratory system: Good air movement and clear to auscultation. Cardiovascular system: S1 & S2 heard, RRR. No JVD. Gastrointestinal system: Abdomen is nondistended, soft and nontender.  Neurological system: Left upper extremity weakness with significant atrophy in the left  forearm Extremities: No pedal edema. Skin: No rashes, lesions or ulcers Psychiatry: Judgement and insight appear normal. Mood & affect appropriate.   Data Reviewed:    Labs: Basic  Metabolic Panel: Recent Labs  Lab 10/17/20 2325  NA 138  K 3.5  CL 101  CO2 21*  GLUCOSE 88  BUN 6  CREATININE 0.67  CALCIUM 9.5  MG 2.3   GFR Estimated Creatinine Clearance: 131.8 mL/min (by C-G formula based on SCr of 0.67 mg/dL). Liver Function Tests: Recent Labs  Lab 10/17/20 2325  AST 40  ALT 39  ALKPHOS 54  BILITOT 0.7  PROT 8.6*  ALBUMIN 4.9   No results for input(s): LIPASE, AMYLASE in the last 168 hours. No results for input(s): AMMONIA in the last 168 hours. Coagulation profile Recent Labs  Lab 10/17/20 2325  INR 1.0   COVID-19 Labs  No results for input(s): DDIMER, FERRITIN, LDH, CRP in the last 72 hours.  Lab Results  Component Value Date   SARSCOV2NAA NEGATIVE 10/18/2020   [place] NEGATIVE 06/15/2020    CBC: Recent Labs  Lab 10/17/20 2325  WBC 3.6*  NEUTROABS 1.4*  HGB 14.6  HCT 40.0  MCV 87.7  PLT 237   Cardiac Enzymes: No results for input(s): CKTOTAL, CKMB, CKMBINDEX, TROPONINI in the last 168 hours. BNP (last 3 results) No results for input(s): PROBNP in the last 8760 hours. CBG: No results for input(s): GLUCAP in the last 168 hours. D-Dimer: No results for input(s): DDIMER in the last 72 hours. Hgb A1c: No results for input(s): HGBA1C in the last 72 hours. Lipid Profile: No results for input(s): CHOL, HDL, LDLCALC, TRIG, CHOLHDL, LDLDIRECT in the last 72 hours.  Thyroid function studies: No results for input(s): TSH, T4TOTAL, T3FREE, THYROIDAB in the last 72 hours.  Invalid input(s): FREET3 Anemia work up: No results for input(s): VITAMINB12, FOLATE, FERRITIN, TIBC, IRON, RETICCTPCT in the last 72 hours. Sepsis Labs: Recent Labs  Lab 10/17/20 2325  WBC 3.6*   Microbiology Recent Results (from the past 240 hour(s))  Resp Panel by RT-PCR (Flu A&B, Covid) Nasopharyngeal Swab     Status: None   Collection Time: 10/18/20 12:54 AM   Specimen: Nasopharyngeal Swab; Nasopharyngeal(NP) swabs in vial transport medium   Result Value Ref Range Status   SARS Coronavirus 2 by RT PCR NEGATIVE NEGATIVE Final    Comment: (NOTE) SARS-CoV-2 target nucleic acids are NOT DETECTED.  The SARS-CoV-2 RNA is generally detectable in upper respiratory specimens during the acute phase of infection. The lowest concentration of SARS-CoV-2 viral copies this assay can detect is 138 copies/mL. A negative result does not preclude SARS-Cov-2 infection and should not be used as the sole basis for treatment or other patient management decisions. A negative result may occur with  improper specimen collection/handling, submission of specimen other than nasopharyngeal swab, presence of viral mutation(s) within the areas targeted by this assay, and inadequate number of viral copies(<138 copies/mL). A negative result must be combined with clinical observations, patient history, and epidemiological information. The expected result is Negative.  Fact Sheet for Patients:  EntrepreneurPulse.com.au  Fact Sheet for Healthcare Providers:  IncredibleEmployment.be  This test is no t yet approved or cleared by the [place] FDA and  has been authorized for detection and/or diagnosis of SARS-CoV-2 by FDA under an Emergency Use Authorization (EUA). This EUA will remain  in effect (meaning this test can be used) for the duration of the COVID-19 declaration under Section 564(b)(1) of the Act, 21 U.S.C.section 360bbb-3(b)(1), unless the authorization is terminated  or revoked sooner.       Influenza A by PCR NEGATIVE NEGATIVE Final   Influenza B by PCR NEGATIVE NEGATIVE Final    Comment: (NOTE) The Xpert Xpress SARS-CoV-2/FLU/RSV plus assay is intended as an aid in the diagnosis of influenza from Nasopharyngeal swab specimens and should not be used as a sole basis for treatment. Nasal washings and aspirates are unacceptable for Xpert Xpress SARS-CoV-2/FLU/RSV testing.  Fact Sheet for  Patients: EntrepreneurPulse.com.au  Fact Sheet for Healthcare Providers: IncredibleEmployment.be  This test is not yet approved or cleared by the [place] FDA and has been authorized for detection and/or diagnosis of SARS-CoV-2 by FDA under an Emergency Use Authorization (EUA). This EUA will remain in effect (meaning this test can be used) for the duration of the COVID-19 declaration under Section 564(b)(1) of the Act, 21 U.S.C. section 360bbb-3(b)(1), unless the authorization is terminated or revoked.  Performed at [facility], [address] [place], [place] [address]      Medications:   . enoxaparin (LOVENOX) injection  40 mg Subcutaneous Q24H  . folic acid  1 mg Oral Daily  . LORazepam  0-4 mg Intravenous Q6H   Followed by  . [START ON 10/20/2020] LORazepam  0-4 mg Intravenous Q12H  . multivitamin with minerals  1 tablet Oral Daily  . Tdap  0.5 mL Intramuscular Once  . thiamine  100 mg Oral Daily   Or  . thiamine  100 mg Intravenous Daily   Continuous Infusions:    LOS: 0 days   Charlynne Cousins  [facility]  10/18/2020, 9:31 AM

## 2020-10-18 NOTE — ED Notes (Signed)
Pt was able to hold cup of water independently to drink. MD aware.

## 2020-10-18 NOTE — Consult Note (Signed)
Referring Physician:  No referring provider defined for this encounter.  Primary Physician:  Patient, No Pcp Per  Chief Complaint:  weakness  History of Present Illness: 10/18/2020 Christino Mcglinchey is a 35 y.o. male who presents with the chief complaint of weakness for several weeks.    He reports that he has been worsening for about 2 months, with weakness worse on the left but present on both sides.  His left arm and hand are worst, but his leg is also impacted.  His sensation is abnormal.  He has not fallen, but reports being off balance.  His dexterity and fine motor movements are severely impaired.   Oshen Wlodarczyk has clear and progressive symptoms of cervical myelopathy.  The symptoms are causing a significant impact on the patient's life.   Review of Systems:  A 10 point review of systems is negative, except for the pertinent positives and negatives detailed in the HPI.  Past Medical History: Past Medical History:  Diagnosis Date  . Hodgkin's lymphoma ([place])   . Tobacco use     Past Surgical History: History reviewed. No pertinent surgical history.  Allergies: Allergies as of 10/17/2020  . (No Known Allergies)    Medications:  Current Facility-Administered Medications:  .  acetaminophen (TYLENOL) tablet 650 mg, 650 mg, Oral, Q6H PRN **OR** acetaminophen (TYLENOL) suppository 650 mg, 650 mg, Rectal, Q6H PRN, Judd Gaudier V, MD .  enoxaparin (LOVENOX) injection 40 mg, 40 mg, Subcutaneous, Q24H, Judd Gaudier V, MD, 40 mg at 10/18/20 0936 .  folic acid (FOLVITE) tablet 1 mg, 1 mg, Oral, Daily, Judd Gaudier V, MD, 1 mg at 10/18/20 4010 .  LORazepam (ATIVAN) injection 0-4 mg, 0-4 mg, Intravenous, Q6H **FOLLOWED BY** [START ON 10/20/2020] LORazepam (ATIVAN) injection 0-4 mg, 0-4 mg, Intravenous, Q12H, Damita Dunnings, Hazel V, MD .  LORazepam (ATIVAN) tablet 1-4 mg, 1-4 mg, Oral, Q1H PRN **OR** LORazepam (ATIVAN) injection 1-4 mg, 1-4 mg, Intravenous, Q1H PRN, Athena Masse, MD .  multivitamin with minerals tablet 1 tablet, 1 tablet, Oral, Daily, Athena Masse, MD, 1 tablet at 10/18/20 0936 .  ondansetron (ZOFRAN) tablet 4 mg, 4 mg, Oral, Q6H PRN **OR** ondansetron (ZOFRAN) injection 4 mg, 4 mg, Intravenous, Q6H PRN, Athena Masse, MD .  oxyCODONE-acetaminophen (PERCOCET/ROXICET) 5-325 MG per tablet 1 tablet, 1 tablet, Oral, Q4H PRN, Lucrezia Starch, MD, 1 tablet at 10/18/20 0434 .  Tdap (BOOSTRIX) injection 0.5 mL, 0.5 mL, Intramuscular, Once, Lucrezia Starch, MD .  thiamine tablet 100 mg, 100 mg, Oral, Daily, 100 mg at 10/18/20 2725 **OR** thiamine (B-1) injection 100 mg, 100 mg, Intravenous, Daily, Athena Masse, MD  Facility-Administered Medications Ordered in Other Encounters:  .  heparin lock flush 100 unit/mL, 500 Units, Intravenous, Once, Earlie Server, MD .  sodium chloride flush (NS) 0.9 % injection 10 mL, 10 mL, Intravenous, PRN, Earlie Server, MD, 10 mL at 08/07/20 3664   Social History: Social History   Tobacco Use  . Smoking status: Current Every Day Smoker    Packs/day: 0.50    Years: 20.00    Pack years: 10.00  . Smokeless tobacco: Never Used  Vaping Use  . Vaping Use: Never used  Substance Use Topics  . Alcohol use: Yes    Comment: occasional   . Drug use: Not Currently    Family Medical History: Family History  Problem Relation Age of Onset  . Heart failure Mother   . Pneumonia Father     Physical Examination:  Vitals:   10/18/20 0423 10/18/20 0758  BP: 131/81 124/81  Pulse: 84 84  Resp: 19 16  Temp:  98 F (36.7 C)  SpO2: 99% 98%     General: Patient is well developed, well nourished, calm, collected, and in no apparent distress.  Psychiatric: Patient is non-anxious.  Head:  Pupils equal, round, and reactive to light.  ENT:  Oral mucosa appears well hydrated.  Neck:   Supple.  Full range of motion.  Respiratory: Patient is breathing without any difficulty.  Extremities: No edema.  Vascular: Palpable  pulses in dorsal pedal vessels.  Skin:   On exposed skin, there are no abnormal skin lesions.  NEUROLOGICAL:  General: In no acute distress.   Awake, alert, oriented to person, place, and time.  Pupils equal round and reactive to light.  Facial tone is symmetric.  Tongue protrusion is midline.  There is no pronator drift.  ROM of spine: diminished   Strength: Side Biceps Triceps Deltoid Interossei Grip Wrist Ext. Wrist Flex.  R 5 4+ 5 4- 3 3 3   L 4+ 4 4- 2 2 2 2    Side Iliopsoas Quads Hamstring PF DF EHL  R 5 5 5 5 5 5   L 4 4+ 5 5 5 5     Bilateral upper and lower extremity sensation is diminished to light touch. Reflexes are 3+ and symmetric at the biceps, triceps, brachioradialis, patella and achilles. Hoffman's is present on the left.  Clonus is not present.  Toes are down-going.    Gait is untested.  Imaging: MRI CTL spine 10/18/20 IMPRESSION: 1. Bulky degenerative disc extrusion at C5-C6 with severe spinal stenosis, spinal cord mass effect and abnormal cord signal (edema and/or myelomalacia). Moderate to severe multifactorial right C6 foraminal stenosis.  2. Smaller disc herniation at C3-C4, and lobulated disc bulging at C4-C5 with up to mild spinal stenosis at those levels. Up to moderate degenerative right C4, left C7, and moderate to severe bilateral C8 neural foraminal stenosis.  3. No metastatic disease identified in the cervical spine.  Electronically Signed: By: Genevie Ann M.D. On: 10/18/2020 04:34  IMPRESSION: 1. No evidence of metastatic disease to the thoracic or lumbar spine. 2. Partially visualized anterior mediastinal lymphadenopathy, consistent with lymphoma. 3. Small left L5-S1 extraforaminal disc protrusion could irritate the left L5 nerve root.   Electronically Signed   By: Ulyses Jarred M.D.   On: 10/18/2020 04:00  I have personally reviewed the images and agree with the above interpretation.  Labs: CBC Latest Ref Rng & Units 10/17/2020  10/03/2020 09/15/2020  WBC 4.0 - 10.5 K/uL 3.6(L) 3.5(L) 6.3  Hemoglobin 13.0 - 17.0 g/dL 14.6 14.1 14.0  Hematocrit 39.0 - 52.0 % 40.0 38.4(L) 37.6(L)  Platelets 150 - 400 K/uL 237 269 287       Assessment and Plan: Mr. Batzel is a pleasant 35 y.o. male with severe cervical stenosis causing cervical myelopathy.  He has history of lymphoma and is currently in treatment.  I have asked his oncologist to comment on suitability for proceeding with surgery.   He has severe stenosis and worsening weakness.  Non-operative management is inappropriate.  I have recommended surgical intervention with C5-6 anterior cervical discectomy and fusion.   I discussed the planned procedure at length with the patient, including the risks, benefits, alternatives, and indications. The risks discussed include but are not limited to bleeding, infection, need for reoperation, spinal fluid leak, stroke, vision loss, anesthetic complication, coma, paralysis, and even death. We  also discussed the possibility of post-operative dysphagia, vocal cord paralysis, and the risk of adjacent segment disease in the future. I also described in detail that improvement was not guaranteed.  The patient expressed understanding of these risks, and asked that we proceed with surgery. I described the surgery in layman's terms, and gave ample opportunity for questions, which were answered to the best of my ability.  Pending medical clearance, we will proceed when the operating room has availability, likely Friday AM.   Lovie Chol. Izora Ribas MD, [facility] [facility]

## 2020-10-18 NOTE — H&P (View-Only) (Signed)
Referring Physician:  No referring provider defined for this encounter.  Primary Physician:  Patient, No Pcp Per  Chief Complaint:  weakness  History of Present Illness: 10/18/2020 Preston [name] is a 35 y.o. male who presents with the chief complaint of weakness for several weeks.    He reports that he has been worsening for about 2 months, with weakness worse on the left but present on both sides.  His left arm and hand are worst, but his leg is also impacted.  His sensation is abnormal.  He has not fallen, but reports being off balance.  His dexterity and fine motor movements are severely impaired.   Preston [name] has clear and progressive symptoms of cervical myelopathy.  The symptoms are causing a significant impact on the patient's life.   Review of Systems:  A 10 point review of systems is negative, except for the pertinent positives and negatives detailed in the HPI.  Past Medical History: Past Medical History:  Diagnosis Date  . Hodgkin's lymphoma ([place])   . Tobacco use     Past Surgical History: History reviewed. No pertinent surgical history.  Allergies: Allergies as of 10/17/2020  . (No Known Allergies)    Medications:  Current Facility-Administered Medications:  .  acetaminophen (TYLENOL) tablet 650 mg, 650 mg, Oral, Q6H PRN **OR** acetaminophen (TYLENOL) suppository 650 mg, 650 mg, Rectal, Q6H PRN, Judd Gaudier V, MD .  enoxaparin (LOVENOX) injection 40 mg, 40 mg, Subcutaneous, Q24H, Judd Gaudier V, MD, 40 mg at 10/18/20 0936 .  folic acid (FOLVITE) tablet 1 mg, 1 mg, Oral, Daily, Judd Gaudier V, MD, 1 mg at 10/18/20 4270 .  LORazepam (ATIVAN) injection 0-4 mg, 0-4 mg, Intravenous, Q6H **FOLLOWED BY** [START ON 10/20/2020] LORazepam (ATIVAN) injection 0-4 mg, 0-4 mg, Intravenous, Q12H, Damita Dunnings, Hazel V, MD .  LORazepam (ATIVAN) tablet 1-4 mg, 1-4 mg, Oral, Q1H PRN **OR** LORazepam (ATIVAN) injection 1-4 mg, 1-4 mg, Intravenous, Q1H PRN, Athena Masse, MD .  multivitamin with minerals tablet 1 tablet, 1 tablet, Oral, Daily, Athena Masse, MD, 1 tablet at 10/18/20 0936 .  ondansetron (ZOFRAN) tablet 4 mg, 4 mg, Oral, Q6H PRN **OR** ondansetron (ZOFRAN) injection 4 mg, 4 mg, Intravenous, Q6H PRN, Athena Masse, MD .  oxyCODONE-acetaminophen (PERCOCET/ROXICET) 5-325 MG per tablet 1 tablet, 1 tablet, Oral, Q4H PRN, Lucrezia Starch, MD, 1 tablet at 10/18/20 0434 .  Tdap (BOOSTRIX) injection 0.5 mL, 0.5 mL, Intramuscular, Once, Lucrezia Starch, MD .  thiamine tablet 100 mg, 100 mg, Oral, Daily, 100 mg at 10/18/20 6237 **OR** thiamine (B-1) injection 100 mg, 100 mg, Intravenous, Daily, Athena Masse, MD  Facility-Administered Medications Ordered in Other Encounters:  .  heparin lock flush 100 unit/mL, 500 Units, Intravenous, Once, Earlie Server, MD .  sodium chloride flush (NS) 0.9 % injection 10 mL, 10 mL, Intravenous, PRN, Earlie Server, MD, 10 mL at 08/07/20 6283   Social History: Social History   Tobacco Use  . Smoking status: Current Every Day Smoker    Packs/day: 0.50    Years: 20.00    Pack years: 10.00  . Smokeless tobacco: Never Used  Vaping Use  . Vaping Use: Never used  Substance Use Topics  . Alcohol use: Yes    Comment: occasional   . Drug use: Not Currently    Family Medical History: Family History  Problem Relation Age of Onset  . Heart failure Mother   . Pneumonia Father     Physical Examination:  Vitals:   10/18/20 0423 10/18/20 0758  BP: 131/81 124/81  Pulse: 84 84  Resp: 19 16  Temp:  98 F (36.7 C)  SpO2: 99% 98%     General: Patient is well developed, well nourished, calm, collected, and in no apparent distress.  Psychiatric: Patient is non-anxious.  Head:  Pupils equal, round, and reactive to light.  ENT:  Oral mucosa appears well hydrated.  Neck:   Supple.  Full range of motion.  Respiratory: Patient is breathing without any difficulty.  Extremities: No edema.  Vascular: Palpable  pulses in dorsal pedal vessels.  Skin:   On exposed skin, there are no abnormal skin lesions.  NEUROLOGICAL:  General: In no acute distress.   Awake, alert, oriented to person, place, and time.  Pupils equal round and reactive to light.  Facial tone is symmetric.  Tongue protrusion is midline.  There is no pronator drift.  ROM of spine: diminished   Strength: Side Biceps Triceps Deltoid Interossei Grip Wrist Ext. Wrist Flex.  R 5 4+ 5 4- 3 3 3   L 4+ 4 4- 2 2 2 2    Side Iliopsoas Quads Hamstring PF DF EHL  R 5 5 5 5 5 5   L 4 4+ 5 5 5 5     Bilateral upper and lower extremity sensation is diminished to light touch. Reflexes are 3+ and symmetric at the biceps, triceps, brachioradialis, patella and achilles. Hoffman's is present on the left.  Clonus is not present.  Toes are down-going.    Gait is untested.  Imaging: MRI CTL spine 10/18/20 IMPRESSION: 1. Bulky degenerative disc extrusion at C5-C6 with severe spinal stenosis, spinal cord mass effect and abnormal cord signal (edema and/or myelomalacia). Moderate to severe multifactorial right C6 foraminal stenosis.  2. Smaller disc herniation at C3-C4, and lobulated disc bulging at C4-C5 with up to mild spinal stenosis at those levels. Up to moderate degenerative right C4, left C7, and moderate to severe bilateral C8 neural foraminal stenosis.  3. No metastatic disease identified in the cervical spine.  Electronically Signed: By: Genevie Ann M.D. On: 10/18/2020 04:34  IMPRESSION: 1. No evidence of metastatic disease to the thoracic or lumbar spine. 2. Partially visualized anterior mediastinal lymphadenopathy, consistent with lymphoma. 3. Small left L5-S1 extraforaminal disc protrusion could irritate the left L5 nerve root.   Electronically Signed   By: Ulyses Jarred M.D.   On: 10/18/2020 04:00  I have personally reviewed the images and agree with the above interpretation.  Labs: CBC Latest Ref Rng & Units 10/17/2020  10/03/2020 09/15/2020  WBC 4.0 - 10.5 K/uL 3.6(L) 3.5(L) 6.3  Hemoglobin 13.0 - 17.0 g/dL 14.6 14.1 14.0  Hematocrit 39.0 - 52.0 % 40.0 38.4(L) 37.6(L)  Platelets 150 - 400 K/uL 237 269 287       Assessment and Plan: [name] is a pleasant 35 y.o. male with severe cervical stenosis causing cervical myelopathy.  He has history of lymphoma and is currently in treatment.  I have asked his oncologist to comment on suitability for proceeding with surgery.   He has severe stenosis and worsening weakness.  Non-operative management is inappropriate.  I have recommended surgical intervention with C5-6 anterior cervical discectomy and fusion.   I discussed the planned procedure at length with the patient, including the risks, benefits, alternatives, and indications. The risks discussed include but are not limited to bleeding, infection, need for reoperation, spinal fluid leak, stroke, vision loss, anesthetic complication, coma, paralysis, and even death. We  also discussed the possibility of post-operative dysphagia, vocal cord paralysis, and the risk of adjacent segment disease in the future. I also described in detail that improvement was not guaranteed.  The patient expressed understanding of these risks, and asked that we proceed with surgery. I described the surgery in layman's terms, and gave ample opportunity for questions, which were answered to the best of my ability.  Pending medical clearance, we will proceed when the operating room has availability, likely Friday AM.   Preston [name]. Izora Ribas MD, [place] [place] [facility]

## 2020-10-19 LAB — PROTIME-INR
INR: 0.9 (ref 0.8–1.2)
Prothrombin Time: 12 seconds (ref 11.4–15.2)

## 2020-10-19 MED ORDER — ENSURE ENLIVE PO LIQD
237.0000 mL | Freq: Three times a day (TID) | ORAL | Status: DC
  Administered 2020-10-19 – 2020-10-21 (×3): 237 mL via ORAL

## 2020-10-19 MED ORDER — MELATONIN 5 MG PO TABS
5.0000 mg | ORAL_TABLET | Freq: Every evening | ORAL | Status: DC | PRN
  Administered 2020-10-20: 5 mg via ORAL
  Filled 2020-10-19 (×2): qty 1

## 2020-10-19 MED ORDER — JUVEN PO PACK
1.0000 | PACK | Freq: Two times a day (BID) | ORAL | Status: DC
  Administered 2020-10-21: 1 via ORAL

## 2020-10-19 NOTE — Progress Notes (Signed)
Referring Physician:  No referring provider defined for this encounter.  Primary Physician:  Patient, No Pcp Per  Chief Complaint:  weakness  History of Present Illness: 10/19/2020 Mr. Kamel has continued symptoms as noted below.  He is stable today.  10/18/2020 Tyrez Berrios is a 35 y.o. male who presents with the chief complaint of weakness for several weeks.    He reports that he has been worsening for about 2 months, with weakness worse on the left but present on both sides.  His left arm and hand are worst, but his leg is also impacted.  His sensation is abnormal.  He has not fallen, but reports being off balance.  His dexterity and fine motor movements are severely impaired.   Urian Martenson has clear and progressive symptoms of cervical myelopathy.  The symptoms are causing a significant impact on the patient's life.   Review of Systems:  A 10 point review of systems is negative, except for the pertinent positives and negatives detailed in the HPI.  Past Medical History:     Past Medical History:  Diagnosis Date  . Hodgkin's lymphoma ([place])   . Tobacco use     Past Surgical History: History reviewed. No pertinent surgical history.  Allergies:    Allergies as of 10/17/2020  . (No Known Allergies)    Medications:  Current Facility-Administered Medications:  .  acetaminophen (TYLENOL) tablet 650 mg, 650 mg, Oral, Q6H PRN **OR** acetaminophen (TYLENOL) suppository 650 mg, 650 mg, Rectal, Q6H PRN, Judd Gaudier V, MD .  enoxaparin (LOVENOX) injection 40 mg, 40 mg, Subcutaneous, Q24H, Judd Gaudier V, MD, 40 mg at 10/18/20 0936 .  folic acid (FOLVITE) tablet 1 mg, 1 mg, Oral, Daily, Judd Gaudier V, MD, 1 mg at 10/18/20 9798 .  LORazepam (ATIVAN) injection 0-4 mg, 0-4 mg, Intravenous, Q6H **FOLLOWED BY** [START ON 10/20/2020] LORazepam (ATIVAN) injection 0-4 mg, 0-4 mg, Intravenous, Q12H, Damita Dunnings, Hazel V, MD .  LORazepam (ATIVAN) tablet 1-4  mg, 1-4 mg, Oral, Q1H PRN **OR** LORazepam (ATIVAN) injection 1-4 mg, 1-4 mg, Intravenous, Q1H PRN, Athena Masse, MD .  multivitamin with minerals tablet 1 tablet, 1 tablet, Oral, Daily, Athena Masse, MD, 1 tablet at 10/18/20 0936 .  ondansetron (ZOFRAN) tablet 4 mg, 4 mg, Oral, Q6H PRN **OR** ondansetron (ZOFRAN) injection 4 mg, 4 mg, Intravenous, Q6H PRN, Athena Masse, MD .  oxyCODONE-acetaminophen (PERCOCET/ROXICET) 5-325 MG per tablet 1 tablet, 1 tablet, Oral, Q4H PRN, Lucrezia Starch, MD, 1 tablet at 10/18/20 0434 .  Tdap (BOOSTRIX) injection 0.5 mL, 0.5 mL, Intramuscular, Once, Lucrezia Starch, MD .  thiamine tablet 100 mg, 100 mg, Oral, Daily, 100 mg at 10/18/20 9211 **OR** thiamine (B-1) injection 100 mg, 100 mg, Intravenous, Daily, Athena Masse, MD  Facility-Administered Medications Ordered in Other Encounters:  .  heparin lock flush 100 unit/mL, 500 Units, Intravenous, Once, Earlie Server, MD .  sodium chloride flush (NS) 0.9 % injection 10 mL, 10 mL, Intravenous, PRN, Earlie Server, MD, 10 mL at 08/07/20 9417   Social History: Social History        Tobacco Use  . Smoking status: Current Every Day Smoker    Packs/day: 0.50    Years: 20.00    Pack years: 10.00  . Smokeless tobacco: Never Used  Vaping Use  . Vaping Use: Never used  Substance Use Topics  . Alcohol use: Yes    Comment: occasional   . Drug use: Not Currently    Family  Medical History:      Family History  Problem Relation Age of Onset  . Heart failure Mother   . Pneumonia Father     Physical Examination:     Vitals:   10/18/20 0423 10/18/20 0758  BP: 131/81 124/81  Pulse: 84 84  Resp: 19 16  Temp:  98 F (36.7 C)  SpO2: 99% 98%     General:          Patient is well developed, well nourished, calm, collected, and in no apparent distress.  Psychiatric:      Patient is non-anxious.  Head:               Pupils equal, round, and reactive to light.  ENT:                 Oral mucosa appears well hydrated.  Neck:               Supple.  Full range of motion.  Respiratory:     Patient is breathing without any difficulty.  Extremities:     No edema.  Vascular:         Palpable pulses in dorsal pedal vessels.  Skin:                On exposed skin, there are no abnormal skin lesions.  NEUROLOGICAL:  General: In no acute distress.   Awake, alert, oriented to person, place, and time.  Pupils equal round and reactive to light.  Facial tone is symmetric.  Tongue protrusion is midline.  There is no pronator drift.  ROM of spine: diminished   Strength: Side Biceps Triceps Deltoid Interossei Grip Wrist Ext. Wrist Flex.  R 5 4+ 5 4- 3 3 3   L 4+ 4 4- 2 2 2 2    Side Iliopsoas Quads Hamstring PF DF EHL  R 5 5 5 5 5 5   L 4 4+ 5 5 5 5     Bilateral upper and lower extremity sensation is diminished to light touch. Reflexes are 3+ and symmetric at the biceps, triceps, brachioradialis, patella and achilles. Hoffman's is present on the left.  Clonus is not present.  Toes are down-going.    Gait is untested.  Imaging: MRI CTL spine 10/18/20 IMPRESSION: 1. Bulky degenerative disc extrusion at C5-C6 with severe spinal stenosis, spinal cord mass effect and abnormal cord signal (edema and/or myelomalacia). Moderate to severe multifactorial right C6 foraminal stenosis.  2. Smaller disc herniation at C3-C4, and lobulated disc bulging at C4-C5 with up to mild spinal stenosis at those levels. Up to moderate degenerative right C4, left C7, and moderate to severe bilateral C8 neural foraminal stenosis.  3. No metastatic disease identified in the cervical spine.  Electronically Signed: By: Genevie Ann M.D. On: 10/18/2020 04:34  IMPRESSION: 1. No evidence of metastatic disease to the thoracic or lumbar spine. 2. Partially visualized anterior mediastinal lymphadenopathy, consistent with lymphoma. 3. Small left L5-S1 extraforaminal disc protrusion  could irritate the left L5 nerve root.   Electronically Signed By: Ulyses Jarred M.D. On: 10/18/2020 04:00  I have personally reviewed the images and agree with the above interpretation.  Labs: CBC Latest Ref Rng & Units 10/17/2020 10/03/2020 09/15/2020  WBC 4.0 - 10.5 K/uL 3.6(L) 3.5(L) 6.3  Hemoglobin 13.0 - 17.0 g/dL 14.6 14.1 14.0  Hematocrit 39.0 - 52.0 % 40.0 38.4(L) 37.6(L)  Platelets 150 - 400 K/uL 237 269 287       Assessment and Plan: Mr. Mucha is  a pleasant 35 y.o. male with severe cervical stenosis causing cervical myelopathy.  He has history of lymphoma and is currently in treatment. He has been cleared for surgery.  We will move forward with surgery tomorrow AM.    Lovie Chol. Izora Ribas MD, [facility] [facility]

## 2020-10-19 NOTE — TOC Initial Note (Signed)
Transition of Care [facility]) - Initial/Assessment Note    Patient Details  Name: Preston [name] MRN: [id number] Date of Birth: 1986/07/21  Transition of Care [facility]: Lambert Rhonda W) CM/SW Contact:    Magnus Ivan, LCSW Phone Number: 10/19/2020, 2:46 PM  Clinical Narrative:               [facility] consult for substance abuse resources. Patient also has no insurance. CSW met with patient at bedside. Patient reported he lives with family and has reliable transportation. No PCP or Pharmacy, patient interested in [facility]. Provided application and made referral. Patient aware to call to make an [facility] appointment. Patient asked for assistance with Medicaid, CSW reached out to [employer]. Patient denied additional needs.   Expected Discharge Plan: Home/Self Care Barriers to Discharge: Continued Medical Work up   Patient Goals and CMS Choice Patient states their goals for this hospitalization and ongoing recovery are:: to return home CMS Medicare.gov Compare Post Acute Care list provided to:: Patient Choice offered to / list presented to : Patient  Expected Discharge Plan and Services Expected Discharge Plan: Home/Self Care       Living arrangements for the past 2 months: Single Family Home                                      Prior Living Arrangements/Services Living arrangements for the past 2 months: Single Family Home Lives with:: Relatives Patient language and need for interpreter reviewed:: Yes Do you feel safe going back to the place where you live?: Yes      Need for Family Participation in Patient Care: Yes (Comment) Care giver support system in place?: Yes (comment)   Criminal Activity/Legal Involvement Pertinent to Current Situation/Hospitalization: No - Comment as needed  Activities of Daily Living Home Assistive Devices/Equipment: None ADL Screening (condition at time of admission) Patient's cognitive ability adequate to safely  complete daily activities?: Yes Is the patient deaf or have difficulty hearing?: No Does the patient have difficulty seeing, even when wearing glasses/contacts?: No Does the patient have difficulty concentrating, remembering, or making decisions?: No Patient able to express need for assistance with ADLs?: Yes Does the patient have difficulty dressing or bathing?: No Independently performs ADLs?: Yes (appropriate for developmental age) Does the patient have difficulty walking or climbing stairs?: No Weakness of Legs: Both Weakness of Arms/Hands: Both  Permission Sought/Granted Permission sought to share information with : Chartered certified accountant granted to share information with : Yes, Verbal Permission Granted     Permission granted to share info w AGENCY: [facility], [facility]        Emotional Assessment       Orientation: : Oriented to Self,Oriented to Place,Oriented to  Time,Oriented to Situation Alcohol / Substance Use: Not Applicable Psych Involvement: No (comment)  Admission diagnosis:  Dehydration [E86.0] Spinal cord compression (HCC) [G95.20] Weakness [R53.1] 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 Spinal stenosis of cervical region, severe 10/18/2020  . Elevated blood alcohol level 10/18/2020  . Limb weakness 10/18/2020  . Excoriation of multiple sites 10/18/2020  . Cervical stenosis of spinal canal 10/18/2020  . Other chest pain 09/15/2020  .  Pain in joint of left shoulder 09/15/2020  . Dyspnea 09/15/2020  . Encounter for antineoplastic chemotherapy 07/10/2020  . Tobacco use 05/17/2020  . Goals of care, counseling/discussion 05/17/2020  . Malignant  lymphoma, Hodgkin's type ([place]) 12/30/2019   PCP:  Patient, No Pcp Per Pharmacy:   [facility] [address], [place] - [place] [address] Phone: [phone] Fax: [phone]     Social Determinants of Health (SDOH) Interventions    Readmission Risk Interventions No flowsheet data found.

## 2020-10-19 NOTE — Progress Notes (Signed)
Initial Nutrition Assessment  DOCUMENTATION CODES:   Not applicable  INTERVENTION:  Ensure Enlive po TID, each supplement provides 350 kcal and 20 grams of protein (chocolate)  Juven BID, each packet provides 95 calories, 2.5 grams of protein (collagen), and 9.8 grams of carbohydrate (3 grams sugar); also contains 7 grams of L-arginine and L-glutamine, 300 mg vitamin C, 15 mg vitamin E, 1.2 mcg vitamin B-12, 9.5 mg zinc, 200 mg calcium, and 1.5 g  Calcium Beta-hydroxy-Beta-methylbutyrate to support wound healing  Continue MVI, Folic acid, Thiamine  Pt at risk for refeeding, monitor magnesium, potassium, and phosphorus daily for at least 3 days, MD to replete as needed   NUTRITION DIAGNOSIS:   Increased nutrient needs related to cancer and cancer related treatments,post-op healing (Hodgkin's lymphoma on chemotherapy; severe spinal stenosis pending cervical disectomy and fusion on 3/11) as evidenced by estimated needs.   GOAL:   Patient will meet greater than or equal to 90% of their needs    MONITOR:   PO intake,Supplement acceptance,Weight trends,Labs,I & O's,Skin,Diet advancement  REASON FOR ASSESSMENT:   Malnutrition Screening Tool    ASSESSMENT:  35 year old male admitted with severe spinal stenosis of cervical region presents with new onset right sided weakness that has been worsening as well as ongoing left side weakness over the last few weeks. Past medical history significant of Hodgkin's lymphoma currently undergoing chemotherapy.  RD working remotely.  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 Planned C5-6 anterior cervical discectomy and fusion on 10/20/20.  Spoke with pt via phone, reports not doing so well today. He does not have much of an appetite, has eaten a granola bar today for breakfast, says his wife is going to bring him some lunch. He reports eating nothing over the last past couple of days. Pt recalls usually  having a good appetite and intake, however endorses decreased oral intake for a couple of days due to nausea with occasional vomiting starting around day 4 after chemotherapy. He was drinking Ensure regularly, but says he got tired of them. RD educated on increased needs and the importance of protein. Pt agreeable to drinking Ensure during admission as well as resuming supplements at home.   Per chart, weights have trended down 5 lbs (3%) in the last month; insignificant.   Per notes -no metastatic lesions on MRI -EtOH level 200 on admit -excoriation of multiple sites r/t patients puppy, wound care consult  Medications reviewed and include: Folic acid, Ativan, Thiamine, MVI  Labs reviewed   NUTRITION - FOCUSED PHYSICAL EXAM:  Unable to complete at this time  Diet Order:   Diet Order            Diet regular Room service appropriate? Yes; Fluid consistency: Thin  Diet effective now                 EDUCATION NEEDS:   Education needs have been addressed  Skin:  Skin Assessment: Reviewed RN Assessment  Last BM:  3/8  Height:   Ht Readings from Last 1 Encounters:  10/17/20 5\' 10"  (1.778 m)    Weight:   Wt Readings from Last 1 Encounters:  10/17/20 77.1 kg    BMI:  Body mass index is 24.39 kg/m.  Estimated Nutritional Needs:   Kcal:  6761-9509  Protein:  120-135  Fluid:  >2.3 L   Lajuan Lines, RD, LDN Clinical Nutrition After Hours/Weekend Pager # in [place]

## 2020-10-19 NOTE — Progress Notes (Signed)
[facility] PROGRESS NOTE    Progress Note  Preston [name]  JWJ:[id number] DOB: [birth year] DOA: 10/17/2020 PCP: Patient, No Pcp Per     Brief Narrative:   Preston [name] is an 35 y.o. male past medical history of Hodgkin's lymphoma with chemotherapy about 2 weeks prior to admission continue complaining of weakness on his right side which has been worsening.  Neurosurgery insulted who recommended surgical intervention.  Significant studies: 10/18/2020  MRI of the C-spine degenerative disc extrusion at C5-C6 with severe spinal stenosis and spinal cord mass-effect abnormal cord signaling with moderate to severe multifactorial right foraminal stenosis  Antibiotics: None  Microbiology data: Blood culture:  Procedures: None  Assessment/Plan:   Spinal stenosis of cervical region, severe: Patient presented with upper and lower extremity weakness with MRI showed disc extrusion at C5-C6 with severe spinal stenosis. Neurosurgery was consulted recommended surgical intervention scheduled for 10/20/2020  Preop evaluation: His Gupta score calculator for cardiopulmonary complications is very low (<1%). 2D echo showed an EF of 50% with no aortic stenosis.  No diastolic dysfunction. White blood cell count is trending up hemoglobin is stable at 14 and platelet count 231. She will be seeing INR. The patient has conceded to the risk and benefits of proceeding with surgical intervention. He would like to proceed with surgical intervention.  Malignant lymphoma, Hodgkin's type [facility]): Missed his chemotherapy on 10/17/2020 last chemotherapy [facility] more than 2 weeks ago. No metastatic lesions seen on MRI.  Elevated blood alcohol level Alcohol level 200, will continue to monitor with CIWA protocol.  Excoriation of multiple sites Wound care has been consulted he related to to his puppy.    DVT prophylaxis: lovenox Family Communication:none Status is: Observation  The patient will  require care spanning > 2 midnights and should be moved to inpatient because: Hemodynamically unstable  Dispo: The patient is from: Home              Anticipated d/c is to: Home              Patient currently is not medically stable to d/c.   Difficult to place patient No     Code Status:     Code Status Orders  (From admission, onward)         Start     Ordered   10/18/20 0515  Full code  Continuous        10/18/20 0515        Code Status History    This patient has a current code status but no historical code status.   Advance Care Planning Activity        IV Access:    Peripheral IV   Procedures and diagnostic studies:   DG Chest 2 View  Result Date: 10/18/2020 CLINICAL DATA:  Left shoulder pain, generalized weakness, lymphoma EXAM: CHEST - 2 VIEW COMPARISON:  08/07/2020, CT 08/07/2020 FINDINGS: Lungs are clear. No pneumothorax or pleural effusion. Right paratracheal and suprahilar soft tissue thickening is unchanged, better appreciated on prior CT examination and related to the anterior mediastinal mass. Left upper extremity PICC line tip noted within the superior vena cava. Cardiac size within normal limits. Pulmonary vascularity is normal. No acute bone abnormality. IMPRESSION: Unchanged anterior mediastinal mass. No radiographic evidence of acute cardiopulmonary disease. Electronically Signed   By: Fidela Salisbury MD   On: 10/18/2020 00:42   CT Head Wo Contrast  Result Date: 10/17/2020 CLINICAL DATA:  Neurologic deficit, lymphoma currently undergoing chemotherapy, bilateral hand and foot weakness  EXAM: CT HEAD WITHOUT CONTRAST TECHNIQUE: Contiguous axial images were obtained from the base of the skull through the vertex without intravenous contrast. COMPARISON:  08/23/2020 FINDINGS: Brain: No acute infarct or hemorrhage. Lateral ventricles and midline structures are unremarkable. No acute extra-axial fluid collections. No mass effect. Vascular: No hyperdense vessel  or unexpected calcification. Skull: Normal. Negative for fracture or focal lesion. Sinuses/Orbits: No acute finding. Other: None. IMPRESSION: 1. No acute intracranial process. Electronically Signed   By: Randa Ngo M.D.   On: 10/17/2020 23:56   MR Brain W and Wo Contrast  Result Date: 10/18/2020 CLINICAL DATA:  35 year old male with new onset weakness, loss of bilateral hand grip, weakness in both feet. History of stage III lymphoma with most recent chemotherapy 2 weeks ago. EXAM: MRI HEAD WITHOUT AND WITH CONTRAST TECHNIQUE: Multiplanar, multiecho pulse sequences of the brain and surrounding structures were obtained without and with intravenous contrast. CONTRAST:  22mL GADAVIST GADOBUTROL 1 MMOL/ML IV SOLN COMPARISON:  Cervical spine MRI today reported separately. Head CT yesterday. FINDINGS: Brain: Normal cerebral volume. No restricted diffusion to suggest acute infarction. No midline shift, mass effect, evidence of mass lesion, ventriculomegaly, extra-axial collection or acute intracranial hemorrhage. Cervicomedullary junction and pituitary are within normal limits. Axial FLAIR imaging degraded by motion despite repeated imaging attempts. Otherwise gray and white matter signal is within normal limits throughout the brain. No encephalomalacia or chronic cerebral blood products identified. No abnormal enhancement identified.  No dural thickening. Vascular: Major intracranial vascular flow voids are preserved. The major dural venous sinuses are enhancing and appear to be patent. Skull and upper cervical spine: Cervical spine detailed separately today. Normal visible bone marrow signal. Sinuses/Orbits: Negative orbits. Trace paranasal sinus mucosal thickening. Other: Mastoids are well aerated. Grossly normal visible internal auditory structures. Right superior convexity scalp soft tissue swelling and edema (series 20, image 48 and series 26, image 12). Normal underlying calvarium marrow signal. Other scalp and  face soft tissues appear normal. IMPRESSION: 1. Normal MRI appearance of the brain. No acute intracranial abnormality. 2. Nonspecific right superior convexity scalp soft tissue swelling and edema. Query recent trauma/fall. 3. See Cervical Spine MRI reported separately. Electronically Signed   By: Genevie Ann M.D.   On: 10/18/2020 04:26   MR Cervical Spine W or Wo Contrast  Addendum Date: 10/18/2020   ADDENDUM REPORT: 10/18/2020 04:51 ADDENDUM: Study discussed by telephone with Dr. Hulan Saas on 10/18/2020 at 0447 hours. He is already in discussion with Neurosurgery. Electronically Signed   By: Genevie Ann M.D.   On: 10/18/2020 04:51   Result Date: 10/18/2020 CLINICAL DATA:  35 year old male with new onset weakness, loss of bilateral hand grip, weakness in both feet. History of stage III lymphoma with most recent chemotherapy 2 weeks ago. EXAM: MRI CERVICAL SPINE WITHOUT AND WITH CONTRAST TECHNIQUE: Multiplanar and multiecho pulse sequences of the cervical spine, to include the craniocervical junction and cervicothoracic junction, were obtained without and with intravenous contrast. CONTRAST:  74mL GADAVIST GADOBUTROL 1 MMOL/ML IV SOLN in conjunction with contrast enhanced imaging of the brain reported separately. COMPARISON:  Brain MRI today reported separately. Thoracic and lumbar MRI 0302 hours today. FINDINGS: Alignment: Straightening and mild reversal of cervical lordosis. No significant spondylolisthesis. Vertebrae: Minimal degenerative endplate marrow edema at C5-C6. Normal background bone marrow signal, with no suspicious osseous lesion. Cord: Abnormal. Bulky disc extrusion at C5-C6 results in mass effect on the spinal cord and abnormal central cord signal (series 7, image 9). The abnormal increased T2 cord  signal is eccentric to the left just above the disc (series 8, image 16). See additional details below. No definite cord enhancement. Above the C4-C5 disc and below C6 the visible spinal cord appears within  normal limits. No dural thickening. Posterior Fossa, vertebral arteries, paraspinal tissues: Cervicomedullary junction is within normal limits. Brain detailed separately today. Preserved major vascular flow voids in the neck. Negative visible neck soft tissues, right lung apex. Disc levels: C2-C3: Mild facet hypertrophy. Mild bilateral C3 neural foraminal stenosis. C3-C4: Right paracentral disc protrusion (series 8, image 9). Mild spinal stenosis. Subtle cord mass effect. Additional right eccentric disc bulging and endplate spurring with mild facet hypertrophy results in mild left and mild to moderate right C4 foraminal stenosis. C4-C5: Mildly lobulated right eccentric circumferential disc bulge and endplate spurring. Mild spinal stenosis. No cord mass effect. Mild right C5 foraminal stenosis. C5-C6: Bulky left paracentral disc extrusion (series 5, image 9 and series 8, image 19). Moderate to severe spinal stenosis and cord mass effect with abnormal cord signal as stated above. Additional disc bulging and endplate spurring at this level. Mild left but moderate to severe right C6 foraminal stenosis. C6-C7: Left eccentric circumferential disc bulge and endplate spurring. No significant spinal stenosis. Mild-to-moderate left C7 neural foraminal stenosis. C7-T1: Foraminal, far lateral disc bulge and endplate spurring. Mild ligament flavum hypertrophy. No spinal stenosis. But there is moderate to severe bilateral C8 neural foraminal stenosis. Stable visible upper thoracic levels. IMPRESSION: 1. Bulky degenerative disc extrusion at C5-C6 with severe spinal stenosis, spinal cord mass effect and abnormal cord signal (edema and/or myelomalacia). Moderate to severe multifactorial right C6 foraminal stenosis. 2. Smaller disc herniation at C3-C4, and lobulated disc bulging at C4-C5 with up to mild spinal stenosis at those levels. Up to moderate degenerative right C4, left C7, and moderate to severe bilateral C8 neural foraminal  stenosis. 3. No metastatic disease identified in the cervical spine. Electronically Signed: By: Genevie Ann M.D. On: 10/18/2020 04:34   MR THORACIC SPINE W WO CONTRAST  Result Date: 10/18/2020 CLINICAL DATA:  Stage 3 lymphoma.  New onset weakness. EXAM: MRI THORACIC AND LUMBAR SPINE WITHOUT AND WITH CONTRAST TECHNIQUE: Multiplanar and multiecho pulse sequences of the thoracic and lumbar spine were obtained without and with intravenous contrast. CONTRAST:  61mL GADAVIST GADOBUTROL 1 MMOL/ML IV SOLN COMPARISON:  None. FINDINGS: MRI THORACIC SPINE FINDINGS Alignment:  Physiologic. Vertebrae: No fracture, evidence of discitis, or bone lesion. Cord: Normal signal and morphology. No abnormal contrast enhancement. Paraspinal and other soft tissues: Large amount of anterior mediastinal lymphadenopathy, incompletely visualized. Disc levels: No spinal canal stenosis. MRI LUMBAR SPINE FINDINGS Segmentation:  Standard. Alignment:  Physiologic. Vertebrae:  No fracture, evidence of discitis, or bone lesion. Conus medullaris: Extends to the L1 level and appears normal. No abnormal contrast enhancement. Paraspinal and other soft tissues: Negative. Disc levels: Small left L5-S1 extraforaminal disc protrusion. No spinal canal or neural foraminal stenosis. IMPRESSION: 1. No evidence of metastatic disease to the thoracic or lumbar spine. 2. Partially visualized anterior mediastinal lymphadenopathy, consistent with lymphoma. 3. Small left L5-S1 extraforaminal disc protrusion could irritate the left L5 nerve root. Electronically Signed   By: Ulyses Jarred M.D.   On: 10/18/2020 04:00   MR Lumbar Spine W Wo Contrast  Result Date: 10/18/2020 CLINICAL DATA:  Stage 3 lymphoma.  New onset weakness. EXAM: MRI THORACIC AND LUMBAR SPINE WITHOUT AND WITH CONTRAST TECHNIQUE: Multiplanar and multiecho pulse sequences of the thoracic and lumbar spine were obtained without and with  intravenous contrast. CONTRAST:  79mL GADAVIST GADOBUTROL 1 MMOL/ML IV  SOLN COMPARISON:  None. FINDINGS: MRI THORACIC SPINE FINDINGS Alignment:  Physiologic. Vertebrae: No fracture, evidence of discitis, or bone lesion. Cord: Normal signal and morphology. No abnormal contrast enhancement. Paraspinal and other soft tissues: Large amount of anterior mediastinal lymphadenopathy, incompletely visualized. Disc levels: No spinal canal stenosis. MRI LUMBAR SPINE FINDINGS Segmentation:  Standard. Alignment:  Physiologic. Vertebrae:  No fracture, evidence of discitis, or bone lesion. Conus medullaris: Extends to the L1 level and appears normal. No abnormal contrast enhancement. Paraspinal and other soft tissues: Negative. Disc levels: Small left L5-S1 extraforaminal disc protrusion. No spinal canal or neural foraminal stenosis. IMPRESSION: 1. No evidence of metastatic disease to the thoracic or lumbar spine. 2. Partially visualized anterior mediastinal lymphadenopathy, consistent with lymphoma. 3. Small left L5-S1 extraforaminal disc protrusion could irritate the left L5 nerve root. Electronically Signed   By: Ulyses Jarred M.D.   On: 10/18/2020 04:00     Medical Consultants:    None.   Subjective:    Preston [name] no complaints.  Objective:    Vitals:   10/18/20 2012 10/19/20 0049 10/19/20 0436 10/19/20 0749  BP: (!) 141/89 139/89 138/79 (!) 150/98  Pulse: 84 72 71 82  Resp: 18 18 18 16   Temp: 98 F (36.7 C) 98.7 F (37.1 C) 97.9 F (36.6 C) 98 F (36.7 C)  TempSrc:      SpO2: 98% 99% 100% 100%  Weight:      Height:       SpO2: 100 %  No intake or output data in the 24 hours ending 10/19/20 0913 Filed Weights   10/17/20 2322  Weight: 77.1 kg    Exam: General exam: In no acute distress. Respiratory system: Good air movement and clear to auscultation. Cardiovascular system: S1 & S2 heard, RRR. No JVD. Gastrointestinal system: Abdomen is nondistended, soft and nontender.  Extremities: No pedal edema. Skin: No rashes, lesions or ulcers  Data  Reviewed:    Labs: Basic Metabolic Panel: Recent Labs  Lab 10/17/20 2325 10/18/20 0850  NA 138  --   K 3.5  --   CL 101  --   CO2 21*  --   GLUCOSE 88  --   BUN 6  --   CREATININE 0.67  --   CALCIUM 9.5  --   MG 2.3  --   PHOS  --  4.3   GFR Estimated Creatinine Clearance: 131.8 mL/min (by C-G formula based on SCr of 0.67 mg/dL). Liver Function Tests: Recent Labs  Lab 10/17/20 2325  AST 40  ALT 39  ALKPHOS 54  BILITOT 0.7  PROT 8.6*  ALBUMIN 4.9   No results for input(s): LIPASE, AMYLASE in the last 168 hours. No results for input(s): AMMONIA in the last 168 hours. Coagulation profile Recent Labs  Lab 10/17/20 2325  INR 1.0   COVID-19 Labs  No results for input(s): DDIMER, FERRITIN, LDH, CRP in the last 72 hours.  Lab Results  Component Value Date   SARSCOV2NAA NEGATIVE 10/18/2020   [place] NEGATIVE 06/15/2020    CBC: Recent Labs  Lab 10/17/20 2325  WBC 3.6*  NEUTROABS 1.4*  HGB 14.6  HCT 40.0  MCV 87.7  PLT 237   Cardiac Enzymes: No results for input(s): CKTOTAL, CKMB, CKMBINDEX, TROPONINI in the last 168 hours. BNP (last 3 results) No results for input(s): PROBNP in the last 8760 hours. CBG: No results for input(s): GLUCAP in the last 168 hours.  D-Dimer: No results for input(s): DDIMER in the last 72 hours. Hgb A1c: No results for input(s): HGBA1C in the last 72 hours. Lipid Profile: No results for input(s): CHOL, HDL, LDLCALC, TRIG, CHOLHDL, LDLDIRECT in the last 72 hours. Thyroid function studies: No results for input(s): TSH, T4TOTAL, T3FREE, THYROIDAB in the last 72 hours.  Invalid input(s): FREET3 Anemia work up: No results for input(s): VITAMINB12, FOLATE, FERRITIN, TIBC, IRON, RETICCTPCT in the last 72 hours. Sepsis Labs: Recent Labs  Lab 10/17/20 2325  WBC 3.6*   Microbiology Recent Results (from the past 240 hour(s))  Resp Panel by RT-PCR (Flu A&B, Covid) Nasopharyngeal Swab     Status: None   Collection Time:  10/18/20 12:54 AM   Specimen: Nasopharyngeal Swab; Nasopharyngeal(NP) swabs in vial transport medium  Result Value Ref Range Status   SARS Coronavirus 2 by RT PCR NEGATIVE NEGATIVE Final    Comment: (NOTE) SARS-CoV-2 target nucleic acids are NOT DETECTED.  The SARS-CoV-2 RNA is generally detectable in upper respiratory specimens during the acute phase of infection. The lowest concentration of SARS-CoV-2 viral copies this assay can detect is 138 copies/mL. A negative result does not preclude SARS-Cov-2 infection and should not be used as the sole basis for treatment or other patient management decisions. A negative result may occur with  improper specimen collection/handling, submission of specimen other than nasopharyngeal swab, presence of viral mutation(s) within the areas targeted by this assay, and inadequate number of viral copies(<138 copies/mL). A negative result must be combined with clinical observations, patient history, and epidemiological information. The expected result is Negative.  Fact Sheet for Patients:  EntrepreneurPulse.com.au  Fact Sheet for Healthcare Providers:  IncredibleEmployment.be  This test is no t yet approved or cleared by the [place] FDA and  has been authorized for detection and/or diagnosis of SARS-CoV-2 by FDA under an Emergency Use Authorization (EUA). This EUA will remain  in effect (meaning this test can be used) for the duration of the COVID-19 declaration under Section 564(b)(1) of the Act, 21 U.S.C.section 360bbb-3(b)(1), unless the authorization is terminated  or revoked sooner.       Influenza A by PCR NEGATIVE NEGATIVE Final   Influenza B by PCR NEGATIVE NEGATIVE Final    Comment: (NOTE) The Xpert Xpress SARS-CoV-2/FLU/RSV plus assay is intended as an aid in the diagnosis of influenza from Nasopharyngeal swab specimens and should not be used as a sole basis for treatment. Nasal washings  and aspirates are unacceptable for Xpert Xpress SARS-CoV-2/FLU/RSV testing.  Fact Sheet for Patients: EntrepreneurPulse.com.au  Fact Sheet for Healthcare Providers: IncredibleEmployment.be  This test is not yet approved or cleared by the [place] FDA and has been authorized for detection and/or diagnosis of SARS-CoV-2 by FDA under an Emergency Use Authorization (EUA). This EUA will remain in effect (meaning this test can be used) for the duration of the COVID-19 declaration under Section 564(b)(1) of the Act, 21 U.S.C. section 360bbb-3(b)(1), unless the authorization is terminated or revoked.  Performed at [facility], [address] [place], [place] [address]      Medications:   . Chlorhexidine Gluconate Cloth  6 each Topical Daily  . enoxaparin (LOVENOX) injection  40 mg Subcutaneous Q24H  . folic acid  1 mg Oral Daily  . LORazepam  0-4 mg Intravenous Q6H   Followed by  . [START ON 10/20/2020] LORazepam  0-4 mg Intravenous Q12H  . multivitamin with minerals  1 tablet Oral Daily  . thiamine  100 mg Oral Daily  Or  . thiamine  100 mg Intravenous Daily   Continuous Infusions:    LOS: 1 day   Charlynne Cousins  [facility]  10/19/2020, 9:13 AM

## 2020-10-20 ENCOUNTER — Encounter: Payer: Self-pay | Admitting: Internal Medicine

## 2020-10-20 ENCOUNTER — Inpatient Hospital Stay: Payer: Self-pay | Admitting: Certified Registered"

## 2020-10-20 ENCOUNTER — Encounter
Admission: EM | Disposition: A | Discharge status: Home / Self Care | Payer: Self-pay | Source: Home / Self Care | Attending: Internal Medicine

## 2020-10-20 ENCOUNTER — Inpatient Hospital Stay: Payer: Self-pay

## 2020-10-20 HISTORY — PX: ANTERIOR CERVICAL DECOMP/DISCECTOMY FUSION: SHX1161

## 2020-10-20 LAB — TYPE AND SCREEN
ABO/RH(D): O POS
Antibody Screen: NEGATIVE

## 2020-10-20 LAB — CBC
HCT: 40.5 % (ref 39.0–52.0)
Hemoglobin: 14.7 g/dL (ref 13.0–17.0)
MCH: 32 pg (ref 26.0–34.0)
MCHC: 36.3 g/dL — ABNORMAL HIGH (ref 30.0–36.0)
MCV: 88 fL (ref 80.0–100.0)
Platelets: 251 10*3/uL (ref 150–400)
RBC: 4.6 MIL/uL (ref 4.22–5.81)
RDW: 12.3 % (ref 11.5–15.5)
WBC: 3.2 10*3/uL — ABNORMAL LOW (ref 4.0–10.5)
nRBC: 0 % (ref 0.0–0.2)

## 2020-10-20 LAB — ABO/RH: ABO/RH(D): O POS

## 2020-10-20 IMAGING — RF DG C-ARM 1-60 MIN
1 series · 2 of 2 positions shown · non-contrast
Comparison: Cervical spine MRI [DATE].

CLINICAL DATA: 34-year-old male with bulky C5-C6 disc extrusion,
spinal stenosis and cord mass effect.

EXAM:
CERVICAL SPINE - 2-3 VIEW; DG C-ARM 1-60 MIN

[Series 1: dg x-ray · 0.20mm/px · 2 of 2 slices shown]
[im 1/2]
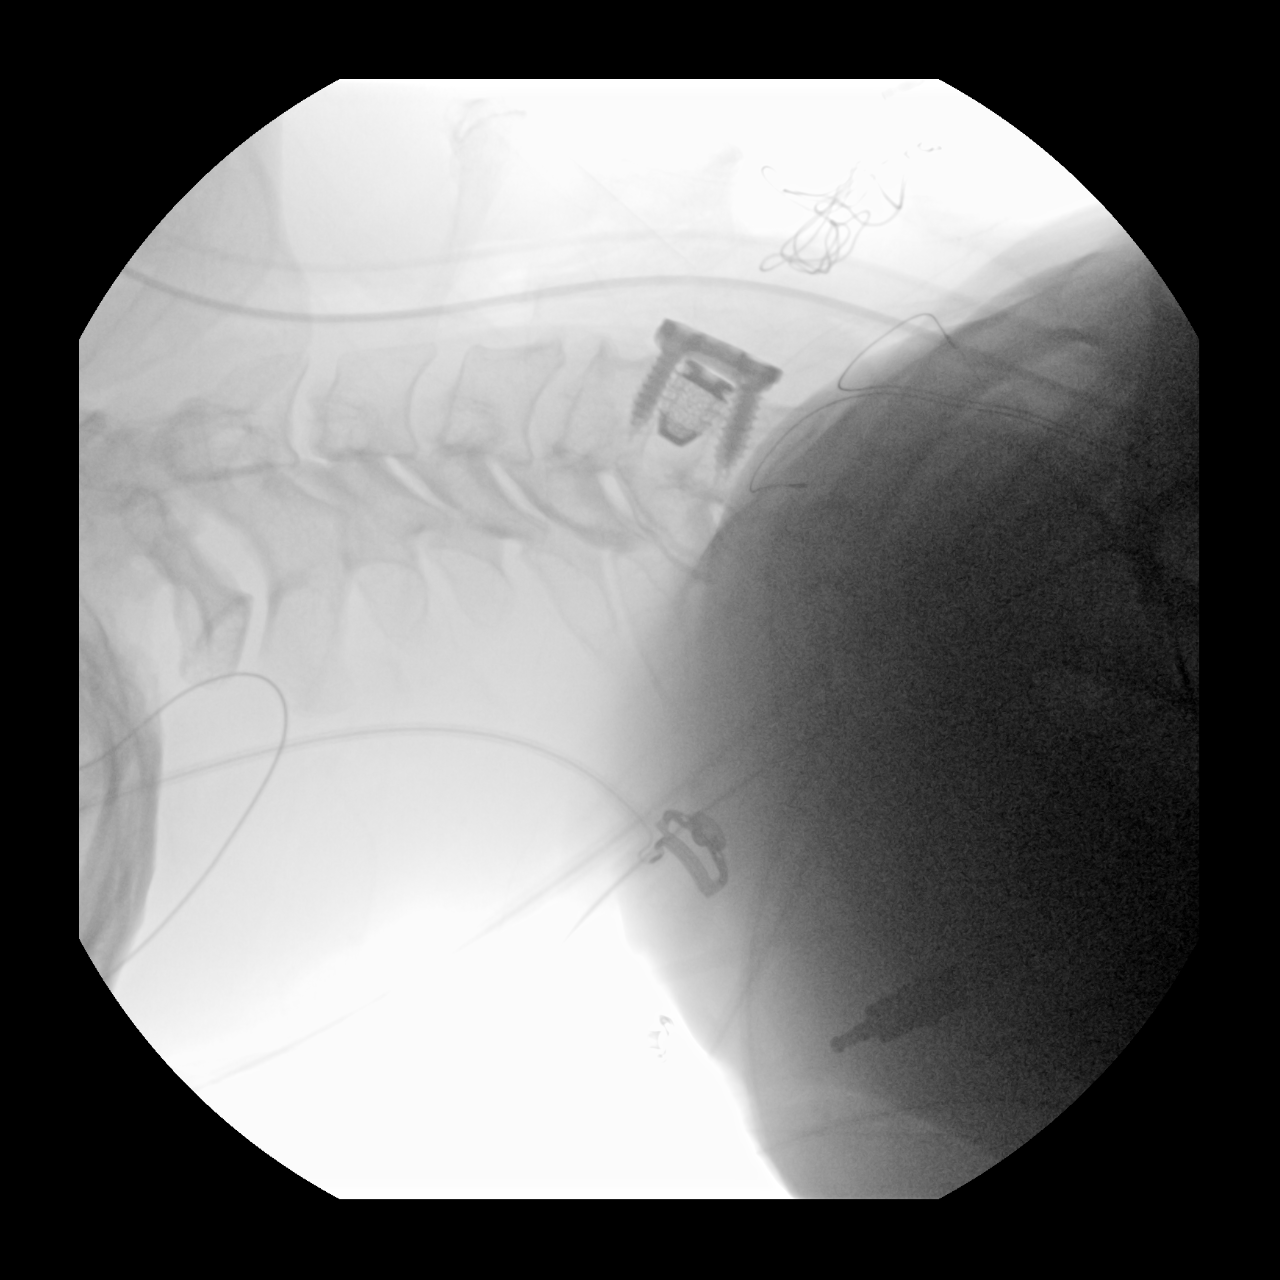
[im 2/2]
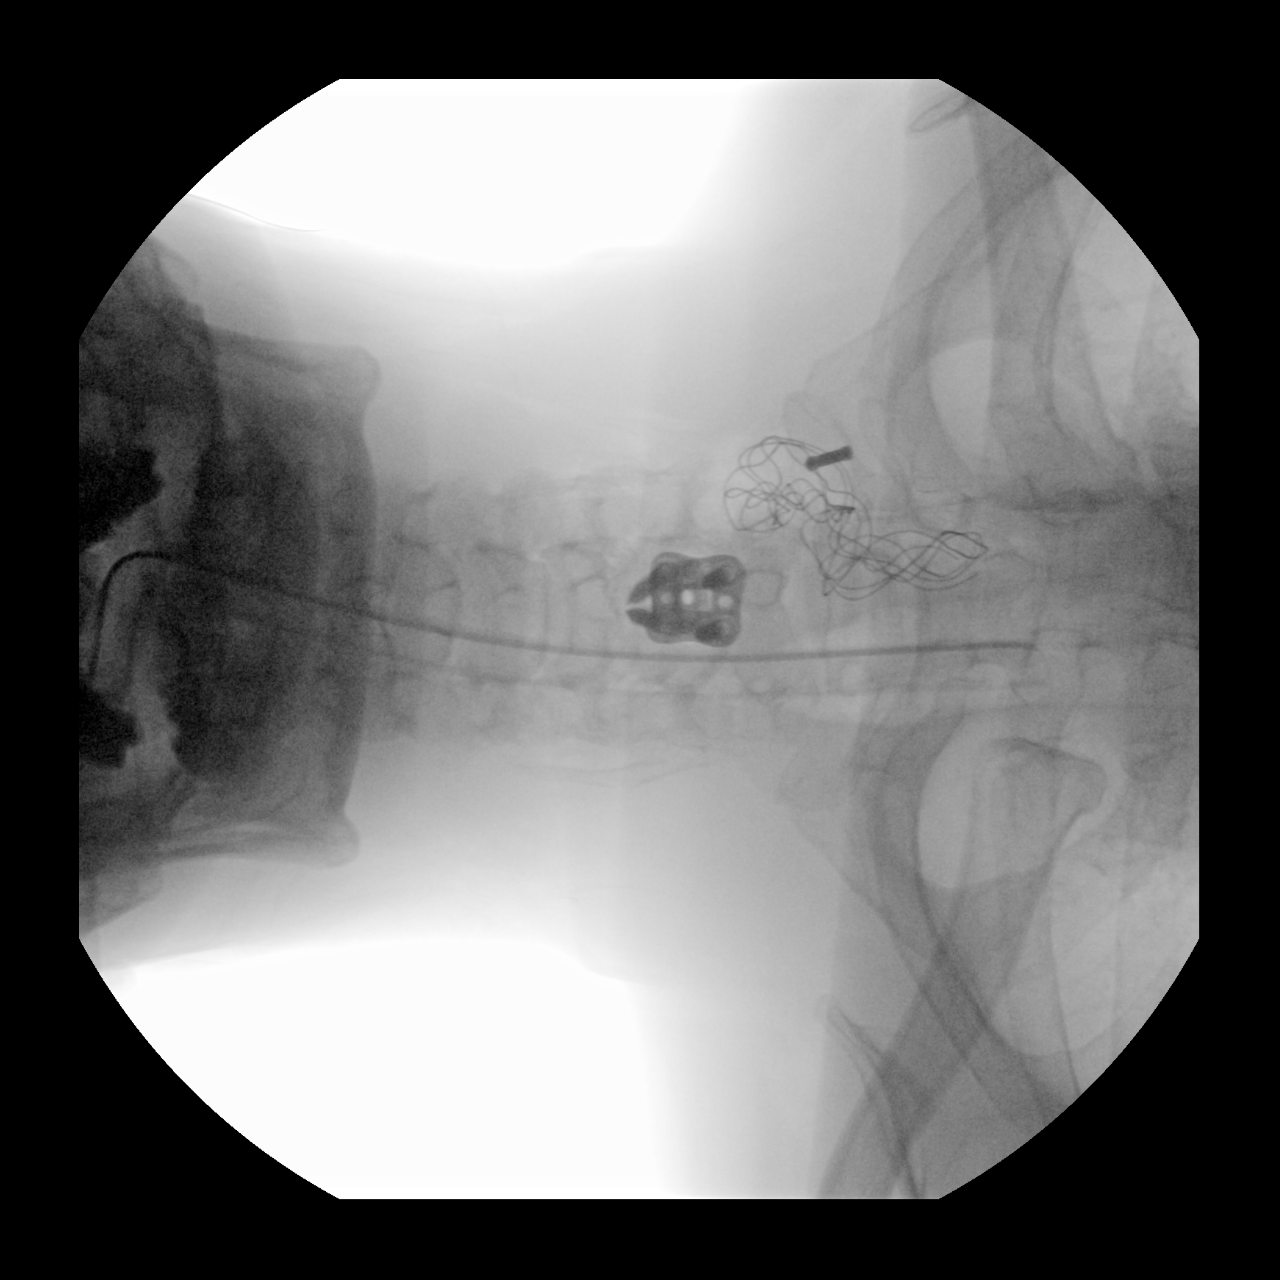

[2 of 2 positions shown; findings below may reference images not displayed]

FINDINGS: Two intraoperative fluoroscopic spot views of the cervical spine in
the AP and lateral projection. New C5-C6 ACDF hardware. Improved
cervical lordosis. Intubated. Small sponge marker seen along the
anterior neck.
IMPRESSION: Intraoperative images of C5-C6 ACDF.

## 2020-10-20 IMAGING — RF DG CERVICAL SPINE 2 OR 3 VIEWS
1 series · 2 of 2 positions shown · non-contrast
Comparison: Cervical spine MRI [DATE].

CLINICAL DATA: 34-year-old male with bulky C5-C6 disc extrusion,
spinal stenosis and cord mass effect.

EXAM:
CERVICAL SPINE - 2-3 VIEW; DG C-ARM 1-60 MIN

[Series 1: dg x-ray · 0.20mm/px · 2 of 2 slices shown]
[im 1/2]
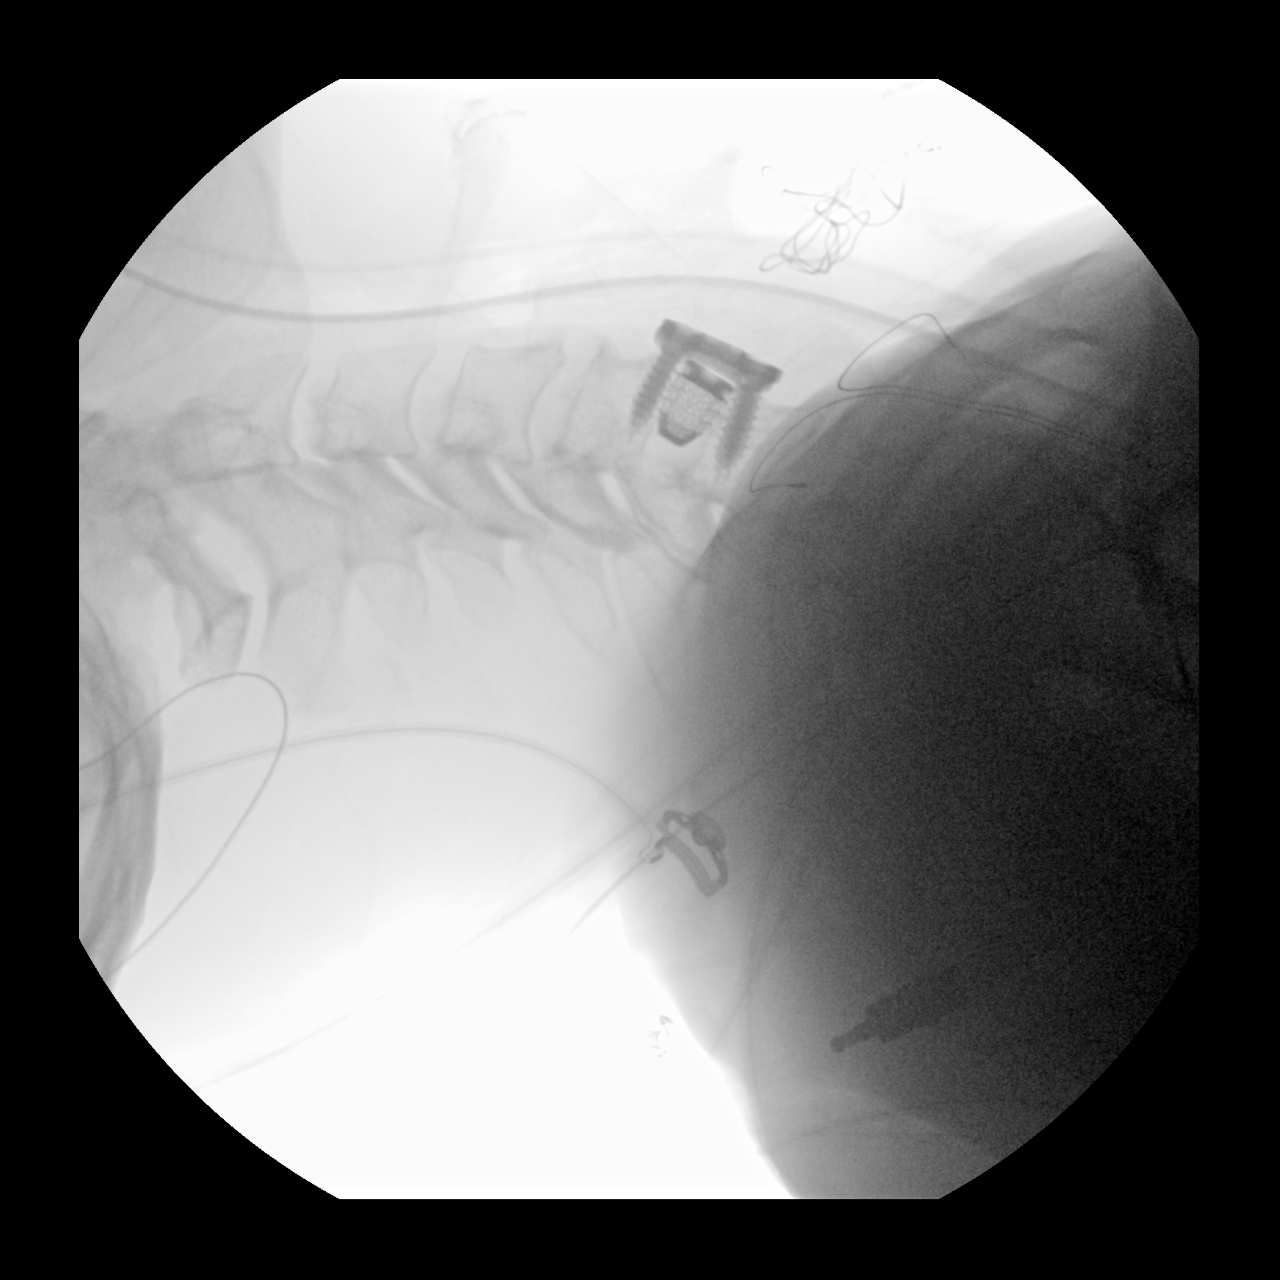
[im 2/2]
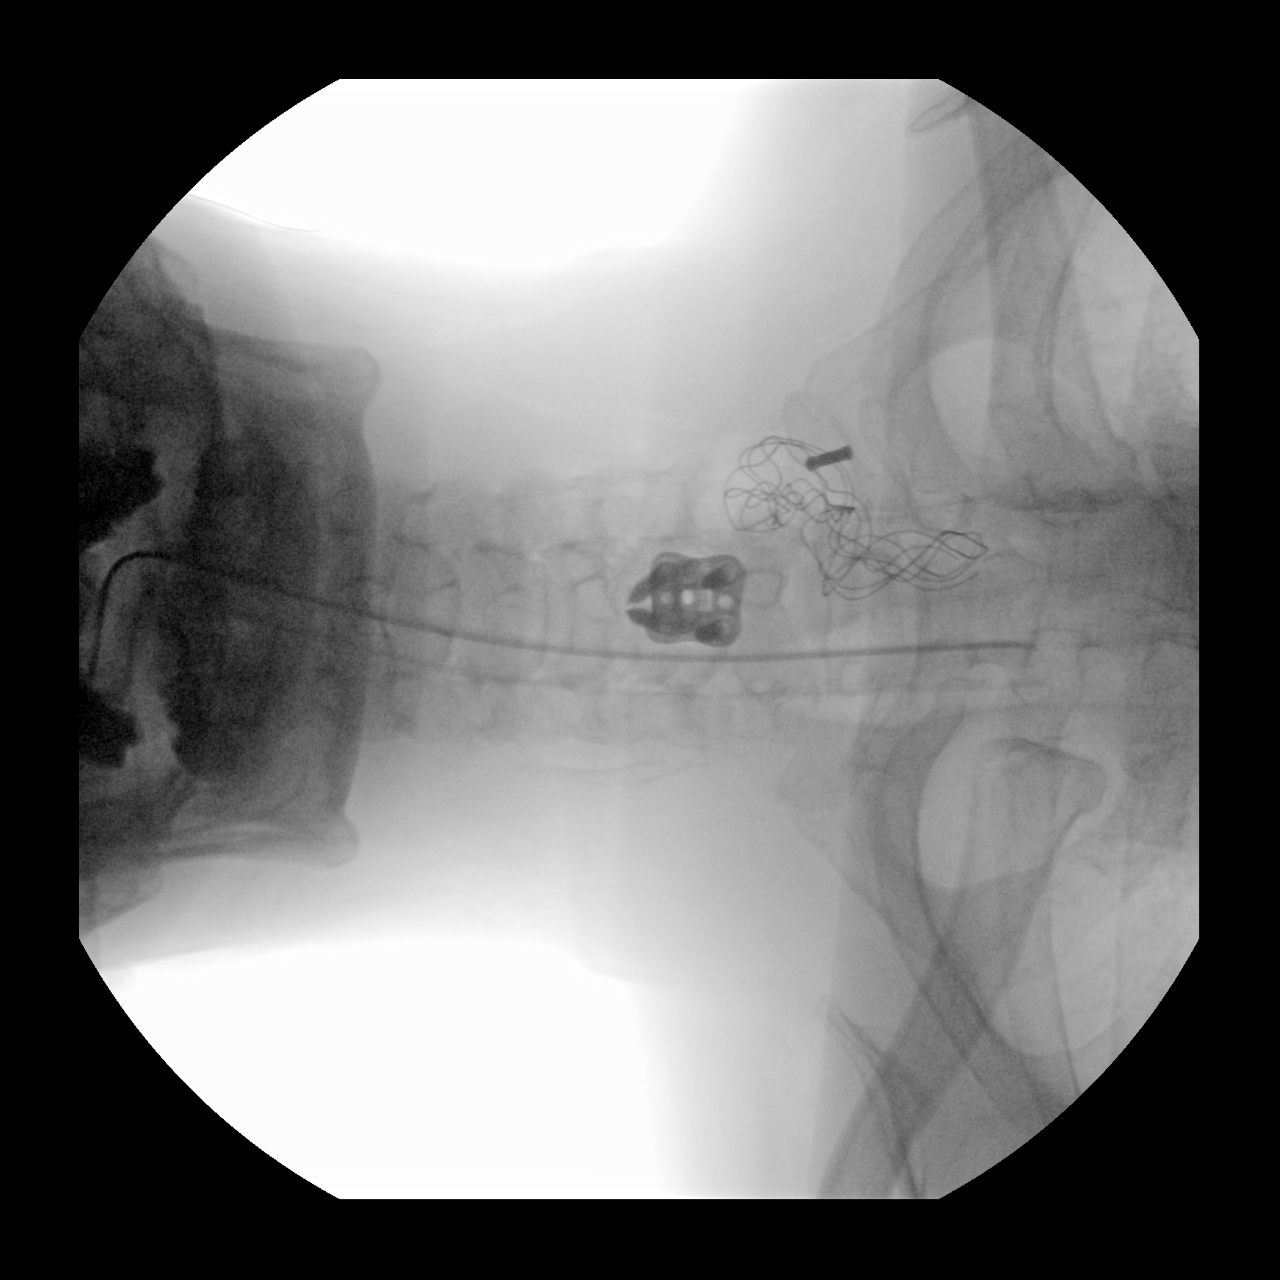

[2 of 2 positions shown; findings below may reference images not displayed]

FINDINGS: Two intraoperative fluoroscopic spot views of the cervical spine in
the AP and lateral projection. New C5-C6 ACDF hardware. Improved
cervical lordosis. Intubated. Small sponge marker seen along the
anterior neck.
IMPRESSION: Intraoperative images of C5-C6 ACDF.

## 2020-10-20 SURGERY — ANTERIOR CERVICAL DECOMPRESSION/DISCECTOMY FUSION 1 LEVEL
Anesthesia: General | Site: Spine Cervical

## 2020-10-20 MED ORDER — REMIFENTANIL HCL 1 MG IV SOLR
INTRAVENOUS | Status: AC
  Filled 2020-10-20: qty 1000

## 2020-10-20 MED ORDER — OXYCODONE HCL 5 MG/5ML PO SOLN: 5.0000 mg | Freq: Once | ORAL | Status: DC | PRN

## 2020-10-20 MED ORDER — ACETAMINOPHEN 10 MG/ML IV SOLN
INTRAVENOUS | Status: AC
  Filled 2020-10-20: qty 100

## 2020-10-20 MED ORDER — BUPIVACAINE-EPINEPHRINE (PF) 0.5% -1:200000 IJ SOLN
INTRAMUSCULAR | Status: DC | PRN
  Administered 2020-10-20: 4 mL

## 2020-10-20 MED ORDER — BUPIVACAINE-EPINEPHRINE (PF) 0.5% -1:200000 IJ SOLN
INTRAMUSCULAR | Status: AC
  Filled 2020-10-20: qty 60

## 2020-10-20 MED ORDER — MORPHINE SULFATE (PF) 2 MG/ML IV SOLN: 2.0000 mg | INTRAVENOUS | Status: DC | PRN

## 2020-10-20 MED ORDER — METHOCARBAMOL 500 MG PO TABS
500.0000 mg | ORAL_TABLET | Freq: Four times a day (QID) | ORAL | Status: DC | PRN
Start: 2020-10-20 — End: 2020-10-21
  Administered 2020-10-20 – 2020-10-21 (×4): 1000 mg via ORAL
  Filled 2020-10-20 (×3): qty 2

## 2020-10-20 MED ORDER — DEXMEDETOMIDINE (PRECEDEX) IN NS 20 MCG/5ML (4 MCG/ML) IV SYRINGE
PREFILLED_SYRINGE | INTRAVENOUS | Status: DC | PRN
  Administered 2020-10-20: 8 ug via INTRAVENOUS

## 2020-10-20 MED ORDER — SUCCINYLCHOLINE CHLORIDE 20 MG/ML IJ SOLN
INTRAMUSCULAR | Status: DC | PRN
  Administered 2020-10-20: 100 mg via INTRAVENOUS

## 2020-10-20 MED ORDER — MIDAZOLAM HCL 2 MG/2ML IJ SOLN
INTRAMUSCULAR | Status: DC | PRN
  Administered 2020-10-20: 2 mg via INTRAVENOUS

## 2020-10-20 MED ORDER — PROPOFOL 500 MG/50ML IV EMUL
INTRAVENOUS | Status: AC
  Filled 2020-10-20: qty 50

## 2020-10-20 MED ORDER — PROPOFOL 10 MG/ML IV BOLUS
INTRAVENOUS | Status: AC
  Filled 2020-10-20: qty 20

## 2020-10-20 MED ORDER — PROPOFOL 10 MG/ML IV BOLUS
INTRAVENOUS | Status: DC | PRN
  Administered 2020-10-20: 150 mg via INTRAVENOUS

## 2020-10-20 MED ORDER — ONDANSETRON HCL 4 MG/2ML IJ SOLN
INTRAMUSCULAR | Status: DC | PRN
  Administered 2020-10-20: 4 mg via INTRAVENOUS

## 2020-10-20 MED ORDER — DEXMEDETOMIDINE (PRECEDEX) IN NS 20 MCG/5ML (4 MCG/ML) IV SYRINGE
PREFILLED_SYRINGE | INTRAVENOUS | Status: AC
  Filled 2020-10-20: qty 5

## 2020-10-20 MED ORDER — DEXAMETHASONE SODIUM PHOSPHATE 10 MG/ML IJ SOLN
INTRAMUSCULAR | Status: DC | PRN
  Administered 2020-10-20: 10 mg via INTRAVENOUS

## 2020-10-20 MED ORDER — MIDAZOLAM HCL 2 MG/2ML IJ SOLN
INTRAMUSCULAR | Status: AC
  Filled 2020-10-20: qty 2

## 2020-10-20 MED ORDER — THROMBIN 5000 UNITS EX SOLR
CUTANEOUS | Status: DC | PRN
  Administered 2020-10-20: 5000 [IU] via TOPICAL

## 2020-10-20 MED ORDER — MORPHINE SULFATE (PF) 4 MG/ML IV SOLN
4.0000 mg | INTRAVENOUS | Status: DC | PRN
  Administered 2020-10-20 – 2020-10-21 (×4): 4 mg via INTRAVENOUS
  Filled 2020-10-20 (×4): qty 1

## 2020-10-20 MED ORDER — MEPERIDINE HCL 50 MG/ML IJ SOLN: 6.2500 mg | INTRAMUSCULAR | Status: DC | PRN

## 2020-10-20 MED ORDER — HYDROMORPHONE HCL 1 MG/ML IJ SOLN
0.2500 mg | INTRAMUSCULAR | Status: DC | PRN
  Administered 2020-10-20: 0.25 mg via INTRAVENOUS
  Administered 2020-10-20: 0.5 mg via INTRAVENOUS

## 2020-10-20 MED ORDER — FENTANYL CITRATE (PF) 100 MCG/2ML IJ SOLN
INTRAMUSCULAR | Status: AC
  Filled 2020-10-20: qty 2

## 2020-10-20 MED ORDER — PHENYLEPHRINE HCL (PRESSORS) 10 MG/ML IV SOLN
INTRAVENOUS | Status: AC
  Filled 2020-10-20: qty 1

## 2020-10-20 MED ORDER — CEFAZOLIN SODIUM-DEXTROSE 2-4 GM/100ML-% IV SOLN
2.0000 g | Freq: Once | INTRAVENOUS | Status: AC
  Administered 2020-10-20: 2 g via INTRAVENOUS

## 2020-10-20 MED ORDER — REMIFENTANIL HCL 1 MG IV SOLR
INTRAVENOUS | Status: DC | PRN
  Administered 2020-10-20: .15 ug/kg/min via INTRAVENOUS
  Administered 2020-10-20: .1 ug/kg/min via INTRAVENOUS

## 2020-10-20 MED ORDER — PROMETHAZINE HCL 25 MG/ML IJ SOLN: 6.2500 mg | INTRAMUSCULAR | Status: DC | PRN

## 2020-10-20 MED ORDER — LACTATED RINGERS IV SOLN: INTRAVENOUS | Status: DC | PRN

## 2020-10-20 MED ORDER — FENTANYL CITRATE (PF) 100 MCG/2ML IJ SOLN
INTRAMUSCULAR | Status: DC | PRN
  Administered 2020-10-20 (×2): 50 ug via INTRAVENOUS

## 2020-10-20 MED ORDER — METHOCARBAMOL 500 MG PO TABS
ORAL_TABLET | ORAL | Status: AC
  Filled 2020-10-20: qty 2

## 2020-10-20 MED ORDER — ACETAMINOPHEN 10 MG/ML IV SOLN
INTRAVENOUS | Status: DC | PRN
  Administered 2020-10-20: 1000 mg via INTRAVENOUS

## 2020-10-20 MED ORDER — FENTANYL CITRATE (PF) 100 MCG/2ML IJ SOLN
25.0000 ug | INTRAMUSCULAR | Status: DC | PRN
  Administered 2020-10-20 (×4): 25 ug via INTRAVENOUS

## 2020-10-20 MED ORDER — OXYCODONE-ACETAMINOPHEN 5-325 MG PO TABS
1.0000 | ORAL_TABLET | ORAL | Status: DC | PRN
  Administered 2020-10-20 – 2020-10-21 (×5): 2 via ORAL
  Filled 2020-10-20 (×5): qty 2

## 2020-10-20 MED ORDER — HYDROMORPHONE HCL 1 MG/ML IJ SOLN
INTRAMUSCULAR | Status: AC
  Filled 2020-10-20: qty 1

## 2020-10-20 MED ORDER — SUCCINYLCHOLINE CHLORIDE 200 MG/10ML IV SOSY
PREFILLED_SYRINGE | INTRAVENOUS | Status: AC
  Filled 2020-10-20: qty 10

## 2020-10-20 MED ORDER — LIDOCAINE HCL (CARDIAC) PF 100 MG/5ML IV SOSY
PREFILLED_SYRINGE | INTRAVENOUS | Status: DC | PRN
  Administered 2020-10-20: 80 mg via INTRAVENOUS

## 2020-10-20 MED ORDER — DEXAMETHASONE SODIUM PHOSPHATE 10 MG/ML IJ SOLN
INTRAMUSCULAR | Status: AC
  Filled 2020-10-20: qty 1

## 2020-10-20 MED ORDER — PROPOFOL 500 MG/50ML IV EMUL
INTRAVENOUS | Status: DC | PRN
  Administered 2020-10-20: 150 ug/kg/min via INTRAVENOUS

## 2020-10-20 MED ORDER — ONDANSETRON HCL 4 MG/2ML IJ SOLN
INTRAMUSCULAR | Status: AC
  Filled 2020-10-20: qty 2

## 2020-10-20 MED ORDER — OXYCODONE HCL 5 MG PO TABS: 5.0000 mg | ORAL_TABLET | Freq: Once | ORAL | Status: DC | PRN

## 2020-10-20 MED ORDER — THROMBIN (RECOMBINANT) 5000 UNITS EX SOLR
CUTANEOUS | Status: AC
  Filled 2020-10-20: qty 5000

## 2020-10-20 MED ORDER — LACTATED RINGERS IV SOLN: INTRAVENOUS | Status: DC

## 2020-10-20 MED ORDER — LIDOCAINE HCL (PF) 2 % IJ SOLN
INTRAMUSCULAR | Status: AC
  Filled 2020-10-20: qty 5

## 2020-10-20 MED ORDER — CEFAZOLIN SODIUM-DEXTROSE 2-4 GM/100ML-% IV SOLN
INTRAVENOUS | Status: AC
  Filled 2020-10-20: qty 100

## 2020-10-20 MED ORDER — SODIUM CHLORIDE 0.9 % IV SOLN
INTRAVENOUS | Status: DC | PRN
  Administered 2020-10-20: 30 ug/min via INTRAVENOUS

## 2020-10-20 SURGICAL SUPPLY — 60 items
BASKET BONE COLLECTION (BASKET) IMPLANT
BULB RESERV EVAC DRAIN JP 100C (MISCELLANEOUS) IMPLANT
BUR NEURO DRILL SOFT 3.0X3.8M (BURR) ×2 IMPLANT
CHLORAPREP W/TINT 26 (MISCELLANEOUS) ×4 IMPLANT
COLLAR CERV LRG MED DENS 3 (SOFTGOODS) ×1 IMPLANT
COUNTER NEEDLE 20/40 LG (NEEDLE) ×2 IMPLANT
COVER WAND RF STERILE (DRAPES) ×2 IMPLANT
CUP MEDICINE 2OZ PLAST GRAD ST (MISCELLANEOUS) ×2 IMPLANT
DERMABOND ADVANCED (GAUZE/BANDAGES/DRESSINGS) ×1
DERMABOND ADVANCED .7 DNX12 (GAUZE/BANDAGES/DRESSINGS) ×1 IMPLANT
DRAIN CHANNEL JP 10F RND 20C F (MISCELLANEOUS) IMPLANT
DRAPE C ARM PK CFD 31 SPINE (DRAPES) ×2 IMPLANT
DRAPE LAPAROTOMY 77X122 PED (DRAPES) ×2 IMPLANT
DRAPE MICROSCOPE SPINE 48X150 (DRAPES) ×2 IMPLANT
DRAPE SURG 17X11 SM STRL (DRAPES) ×8 IMPLANT
DRSG TEGADERM 4X4.75 (GAUZE/BANDAGES/DRESSINGS) ×2 IMPLANT
ELECT CAUTERY BLADE TIP 2.5 (TIP) ×2
ELECT REM PT RETURN 9FT ADLT (ELECTROSURGICAL) ×2
ELECTRODE CAUTERY BLDE TIP 2.5 (TIP) ×1 IMPLANT
ELECTRODE REM PT RTRN 9FT ADLT (ELECTROSURGICAL) ×1 IMPLANT
FEE INTRAOP MONITOR IMPULS NCS (MISCELLANEOUS) IMPLANT
GAUZE SPONGE 4X4 12PLY STRL (GAUZE/BANDAGES/DRESSINGS) ×1 IMPLANT
GLOVE SURG SYN 8.5  E (GLOVE) ×3
GLOVE SURG SYN 8.5 E (GLOVE) ×3 IMPLANT
GLOVE SURG SYN 8.5 PF PI (GLOVE) ×3 IMPLANT
GOWN SRG XL LVL 3 NONREINFORCE (GOWNS) ×1 IMPLANT
GOWN STRL NON-REIN TWL XL LVL3 (GOWNS) ×1
GOWN STRL REUS W/ TWL XL LVL3 (GOWN DISPOSABLE) ×1 IMPLANT
GOWN STRL REUS W/TWL XL LVL3 (GOWN DISPOSABLE) ×1
GRADUATE 1200CC STRL 31836 (MISCELLANEOUS) ×2 IMPLANT
INTRAOP MONITOR FEE IMPULS NCS (MISCELLANEOUS) ×1
INTRAOP MONITOR FEE IMPULSE (MISCELLANEOUS) ×1
KIT TURNOVER KIT A (KITS) ×2 IMPLANT
MANIFOLD NEPTUNE II (INSTRUMENTS) ×2 IMPLANT
MARKER SKIN DUAL TIP RULER LAB (MISCELLANEOUS) ×4 IMPLANT
NDL SAFETY ECLIPSE 18X1.5 (NEEDLE) ×1 IMPLANT
NEEDLE HYPO 18GX1.5 SHARP (NEEDLE) ×1
NEEDLE HYPO 22GX1.5 SAFETY (NEEDLE) ×2 IMPLANT
NS IRRIG 1000ML POUR BTL (IV SOLUTION) ×2 IMPLANT
PACK LAMINECTOMY NEURO (CUSTOM PROCEDURE TRAY) ×2 IMPLANT
PAD ARMBOARD 7.5X6 YLW CONV (MISCELLANEOUS) ×2 IMPLANT
PIN CASPAR 14 (PIN) ×1 IMPLANT
PIN CASPAR 14MM (PIN) ×2 IMPLANT
PLATE ANT CERV XTEND 1 LV 22 (Plate) ×1 IMPLANT
PUTTY DBX 1CC (Putty) ×2 IMPLANT
PUTTY DBX 1CC DEPUY (Putty) IMPLANT
SCREW VAR 4.2 XD SELF DRILL 16 (Screw) ×4 IMPLANT
SPACER HEDRON C 12X14X8 7D (Spacer) ×1 IMPLANT
SPOGE SURGIFLO 8M (HEMOSTASIS) ×1
SPONGE KITTNER 5P (MISCELLANEOUS) ×2 IMPLANT
SPONGE SURGIFLO 8M (HEMOSTASIS) ×1 IMPLANT
STAPLER SKIN PROX 35W (STAPLE) IMPLANT
STRIP CLOSURE SKIN 1/2X4 (GAUZE/BANDAGES/DRESSINGS) ×1 IMPLANT
SUT V-LOC 90 ABS DVC 3-0 CL (SUTURE) ×2 IMPLANT
SUT VIC AB 3-0 SH 8-18 (SUTURE) ×2 IMPLANT
SYR 30ML LL (SYRINGE) ×2 IMPLANT
TAPE CLOTH 3X10 WHT NS LF (GAUZE/BANDAGES/DRESSINGS) ×2 IMPLANT
TOWEL OR 17X26 4PK STRL BLUE (TOWEL DISPOSABLE) ×6 IMPLANT
TRAY FOLEY MTR SLVR 16FR STAT (SET/KITS/TRAYS/PACK) IMPLANT
TUBING CONNECTING 10 (TUBING) ×2 IMPLANT

## 2020-10-20 NOTE — Progress Notes (Signed)
[facility] PROGRESS NOTE    Progress Note  Preston [name]  YSA:[id number] DOB: 03-26-1986 DOA: 10/17/2020 PCP: Patient, No Pcp Per     Brief Narrative:   [name] is an 35 y.o. male past medical history of Hodgkin's lymphoma with chemotherapy about 2 weeks prior to admission continue complaining of weakness on his right side which has been worsening.  Neurosurgery insulted who recommended surgical intervention.  Significant studies: 10/18/2020  MRI of the C-spine degenerative disc extrusion at C5-C6 with severe spinal stenosis and spinal cord mass-effect abnormal cord signaling with moderate to severe multifactorial right foraminal stenosis  Antibiotics: None  Microbiology data: Blood culture:  Procedures: None  Assessment/Plan:   Spinal stenosis of cervical region, severe: Patient presented with upper and lower extremity weakness with MRI showed disc extrusion at C5-C6 with severe spinal stenosis. Neurosurgery was consulted status post surgical intervention on 10/20/2020, will give him IV narcotics for now then can change him to oral. Further management per neurosurgery.  Malignant lymphoma, Hodgkin's type [facility]): Missed his chemotherapy on 10/17/2020 last chemotherapy [facility] more than 2 weeks ago. No metastatic lesions seen on MRI.  Elevated blood alcohol level Alcohol level 200, no signs of withdrawal. Continue thiamine.  Excoriation of multiple sites Wound care has been consulted he related to to his puppy.    DVT prophylaxis: lovenox Family Communication:none Status is: Observation  The patient will require care spanning > 2 midnights and should be moved to inpatient because: Hemodynamically unstable  Dispo: The patient is from: Home              Anticipated d/c is to: Home              Patient currently is not medically stable to d/c.   Difficult to place patient No     Code Status:     Code Status Orders  (From admission, onward)          Start     Ordered   10/18/20 0515  Full code  Continuous        10/18/20 0515        Code Status History    This patient has a current code status but no historical code status.   Advance Care Planning Activity        IV Access:    Peripheral IV   Procedures and diagnostic studies:   DG Cervical Spine 2-3 Views  Result Date: 10/20/2020 CLINICAL DATA:  34 year old male with bulky C5-C6 disc extrusion, spinal stenosis and cord mass effect. EXAM: CERVICAL SPINE - 2-3 VIEW; DG C-ARM 1-60 MIN COMPARISON:  Cervical spine MRI 10/18/2020. FINDINGS: Two intraoperative fluoroscopic spot views of the cervical spine in the AP and lateral projection. New C5-C6 ACDF hardware. Improved cervical lordosis. Intubated. Small sponge marker seen along the anterior neck. IMPRESSION: Intraoperative images of C5-C6 ACDF. Electronically Signed   By: Genevie Ann M.D.   On: 10/20/2020 10:55   DG C-Arm 1-60 Min  Result Date: 10/20/2020 CLINICAL DATA:  35 year old male with bulky C5-C6 disc extrusion, spinal stenosis and cord mass effect. EXAM: CERVICAL SPINE - 2-3 VIEW; DG C-ARM 1-60 MIN COMPARISON:  Cervical spine MRI 10/18/2020. FINDINGS: Two intraoperative fluoroscopic spot views of the cervical spine in the AP and lateral projection. New C5-C6 ACDF hardware. Improved cervical lordosis. Intubated. Small sponge marker seen along the anterior neck. IMPRESSION: Intraoperative images of C5-C6 ACDF. Electronically Signed   By: Genevie Ann M.D.   On: 10/20/2020 10:55  Medical Consultants:    None.   Subjective:    Preston [name] is complaining of neck pain.  Objective:    Vitals:   10/20/20 0945 10/20/20 1000 10/20/20 1015 10/20/20 1100  BP: (!) 176/102 (!) 170/101 (!) 166/103 (!) 168/108  Pulse: 83 60 65 (!) 59  Resp: 12 20  19   Temp:   97.6 F (36.4 C) 98.2 F (36.8 C)  TempSrc:      SpO2: 98% 98% 98% 100%  Weight:      Height:       SpO2: 100 %   Intake/Output Summary (Last  24 hours) at 10/20/2020 1142 Last data filed at 10/20/2020 1015 Gross per 24 hour  Intake 1700 ml  Output 10 ml  Net 1690 ml   Filed Weights   10/17/20 2322 10/20/20 0629  Weight: 77.1 kg 77.1 kg    Exam: General exam: In no acute distress, soft neck brace in place. Respiratory system: Good air movement and clear to auscultation. Cardiovascular system: S1 & S2 heard, RRR. No JVD. Gastrointestinal system: Abdomen is nondistended, soft and nontender.  Extremities: No pedal edema. Skin: No rashes, lesions or ulcers Psychiatry: In a bad mood.  Data Reviewed:    Labs: Basic Metabolic Panel: Recent Labs  Lab 10/17/20 2325 10/18/20 0850  NA 138  --   K 3.5  --   CL 101  --   CO2 21*  --   GLUCOSE 88  --   BUN 6  --   CREATININE 0.67  --   CALCIUM 9.5  --   MG 2.3  --   PHOS  --  4.3   GFR Estimated Creatinine Clearance: 131.8 mL/min (by C-G formula based on SCr of 0.67 mg/dL). Liver Function Tests: Recent Labs  Lab 10/17/20 2325  AST 40  ALT 39  ALKPHOS 54  BILITOT 0.7  PROT 8.6*  ALBUMIN 4.9   No results for input(s): LIPASE, AMYLASE in the last 168 hours. No results for input(s): AMMONIA in the last 168 hours. Coagulation profile Recent Labs  Lab 10/17/20 2325 10/19/20 1120  INR 1.0 0.9   COVID-19 Labs  No results for input(s): DDIMER, FERRITIN, LDH, CRP in the last 72 hours.  Lab Results  Component Value Date   SARSCOV2NAA NEGATIVE 10/18/2020   [place] NEGATIVE 06/15/2020    CBC: Recent Labs  Lab 10/17/20 2325 10/20/20 0422  WBC 3.6* 3.2*  NEUTROABS 1.4*  --   HGB 14.6 14.7  HCT 40.0 40.5  MCV 87.7 88.0  PLT 237 251   Cardiac Enzymes: No results for input(s): CKTOTAL, CKMB, CKMBINDEX, TROPONINI in the last 168 hours. BNP (last 3 results) No results for input(s): PROBNP in the last 8760 hours. CBG: No results for input(s): GLUCAP in the last 168 hours. D-Dimer: No results for input(s): DDIMER in the last 72 hours. Hgb A1c: No  results for input(s): HGBA1C in the last 72 hours. Lipid Profile: No results for input(s): CHOL, HDL, LDLCALC, TRIG, CHOLHDL, LDLDIRECT in the last 72 hours. Thyroid function studies: No results for input(s): TSH, T4TOTAL, T3FREE, THYROIDAB in the last 72 hours.  Invalid input(s): FREET3 Anemia work up: No results for input(s): VITAMINB12, FOLATE, FERRITIN, TIBC, IRON, RETICCTPCT in the last 72 hours. Sepsis Labs: Recent Labs  Lab 10/17/20 2325 10/20/20 0422  WBC 3.6* 3.2*   Microbiology Recent Results (from the past 240 hour(s))  Resp Panel by RT-PCR (Flu A&B, Covid) Nasopharyngeal Swab     Status: None  Collection Time: 10/18/20 12:54 AM   Specimen: Nasopharyngeal Swab; Nasopharyngeal(NP) swabs in vial transport medium  Result Value Ref Range Status   SARS Coronavirus 2 by RT PCR NEGATIVE NEGATIVE Final    Comment: (NOTE) SARS-CoV-2 target nucleic acids are NOT DETECTED.  The SARS-CoV-2 RNA is generally detectable in upper respiratory specimens during the acute phase of infection. The lowest concentration of SARS-CoV-2 viral copies this assay can detect is 138 copies/mL. A negative result does not preclude SARS-Cov-2 infection and should not be used as the sole basis for treatment or other patient management decisions. A negative result may occur with  improper specimen collection/handling, submission of specimen other than nasopharyngeal swab, presence of viral mutation(s) within the areas targeted by this assay, and inadequate number of viral copies(<138 copies/mL). A negative result must be combined with clinical observations, patient history, and epidemiological information. The expected result is Negative.  Fact Sheet for Patients:  EntrepreneurPulse.com.au  Fact Sheet for Healthcare Providers:  IncredibleEmployment.be  This test is no t yet approved or cleared by the [place] FDA and  has been authorized for detection  and/or diagnosis of SARS-CoV-2 by FDA under an Emergency Use Authorization (EUA). This EUA will remain  in effect (meaning this test can be used) for the duration of the COVID-19 declaration under Section 564(b)(1) of the Act, 21 U.S.C.section 360bbb-3(b)(1), unless the authorization is terminated  or revoked sooner.       Influenza A by PCR NEGATIVE NEGATIVE Final   Influenza B by PCR NEGATIVE NEGATIVE Final    Comment: (NOTE) The Xpert Xpress SARS-CoV-2/FLU/RSV plus assay is intended as an aid in the diagnosis of influenza from Nasopharyngeal swab specimens and should not be used as a sole basis for treatment. Nasal washings and aspirates are unacceptable for Xpert Xpress SARS-CoV-2/FLU/RSV testing.  Fact Sheet for Patients: EntrepreneurPulse.com.au  Fact Sheet for Healthcare Providers: IncredibleEmployment.be  This test is not yet approved or cleared by the [place] FDA and has been authorized for detection and/or diagnosis of SARS-CoV-2 by FDA under an Emergency Use Authorization (EUA). This EUA will remain in effect (meaning this test can be used) for the duration of the COVID-19 declaration under Section 564(b)(1) of the Act, 21 U.S.C. section 360bbb-3(b)(1), unless the authorization is terminated or revoked.  Performed at [facility], [address] [place], [place] [address]      Medications:   . Chlorhexidine Gluconate Cloth  6 each Topical Daily  . feeding supplement  237 mL Oral TID BM  . fentaNYL      . folic acid  1 mg Oral Daily  . HYDROmorphone      . LORazepam  0-4 mg Intravenous Q12H  . methocarbamol      . multivitamin with minerals  1 tablet Oral Daily  . nutrition supplement (JUVEN)  1 packet Oral BID BM  . thiamine  100 mg Oral Daily   Or  . thiamine  100 mg Intravenous Daily   Continuous Infusions: . lactated ringers 50 mL/hr at 10/20/20 0718      LOS: 2 days   Charlynne Cousins  [facility]  10/20/2020, 11:42 AM

## 2020-10-20 NOTE — Interval H&P Note (Signed)
History and Physical Interval Note:  10/20/2020 7:04 AM  Preston [name]  has presented today for surgery, with the diagnosis of Removal Intervertebral Disc.  The various methods of treatment have been discussed with the patient and family. After consideration of risks, benefits and other options for treatment, the patient has consented to  Procedure(s): ANTERIOR CERVICAL DECOMPRESSION/DISCECTOMY FUSION 1 LEVEL C5-C6 (N/A) as a surgical intervention.  The patient's history has been reviewed, patient examined, no change in status, stable for surgery.  I have reviewed the patient's chart and labs.  Questions were answered to the patient's satisfaction.     CHESTER YARBROUGH  Heart sounds normal no MRG. Chest Clear to Auscultation Bilaterally.

## 2020-10-20 NOTE — Progress Notes (Signed)
OT Cancellation Note  Patient Details Name: Preston [name] MRN: [id number] DOB: 12-27-1985   Cancelled Treatment:    Reason Eval/Treat Not Completed: Patient at procedure or test/ unavailable. Pt noted to be off the floor for procedure. Per chart, pt undergoing anterior cervical decompression/discectomy fusion 1 level C5-C6. Per protocol, will complete orders at this time and will require new orders to re-evaluate pt after procedure as appropriate.   Dessie Coma, M.S. OTR/L  10/20/20, 7:46 AM  ascom [phone]

## 2020-10-20 NOTE — Anesthesia Procedure Notes (Signed)
Procedure Name: Intubation Date/Time: 10/20/2020 7:28 AM Performed by: Jerrye Noble, CRNA Pre-anesthesia Checklist: Patient identified, Emergency Drugs available, Suction available and Patient being monitored Patient Re-evaluated:Patient Re-evaluated prior to induction Oxygen Delivery Method: Circle system utilized Preoxygenation: Pre-oxygenation with 100% oxygen Induction Type: IV induction Ventilation: Mask ventilation without difficulty Laryngoscope Size: McGraph and 4 Grade View: Grade I Tube type: Oral Tube size: 7.0 mm Number of attempts: 1 Airway Equipment and Method: Stylet,  Oral airway and Video-laryngoscopy Placement Confirmation: ETT inserted through vocal cords under direct vision,  positive ETCO2 and breath sounds checked- equal and bilateral Secured at: 23 cm Tube secured with: Tape Dental Injury: Teeth and Oropharynx as per pre-operative assessment  Comments: Maintained neutral head/neck position during intubation.

## 2020-10-20 NOTE — Transfer of Care (Signed)
Immediate Anesthesia Transfer of Care Note  Patient: Preston [name]  Procedure(s) Performed: ANTERIOR CERVICAL DECOMPRESSION/DISCECTOMY FUSION 1 LEVEL C5-C6 (N/A Spine Cervical)  Patient Location: PACU  Anesthesia Type:General  Level of Consciousness: awake, alert  and oriented  Airway & Oxygen Therapy: Patient Spontanous Breathing and Patient connected to face mask oxygen  Post-op Assessment: Report given to RN and Post -op Vital signs reviewed and stable  Post vital signs: Reviewed and stable  Last Vitals:  Vitals Value Taken Time  BP 151/99 10/20/20 0917  Temp    Pulse 65 10/20/20 0922  Resp 11 10/20/20 0922  SpO2 100 % 10/20/20 0922  Vitals shown include unvalidated device data.  Last Pain:  Vitals:   10/20/20 0629  TempSrc: Oral  PainSc: 4          Complications: No complications documented.

## 2020-10-20 NOTE — Op Note (Signed)
Indications: Preston [name] is suffering from cervical myelopathy. he presented with worsening weakness, prompting surgery.   Findings: large disc herniation  Preoperative Diagnosis: Cervical myelopathy Postoperative Diagnosis: same   EBL: 10 ml IVF: 400 ml Drains: none Disposition: Extubated and Stable to PACU Complications: none  No foley catheter was placed.   Preoperative Note:   Risks of surgery discussed include: infection, bleeding, stroke, coma, death, paralysis, CSF leak, nerve/spinal cord injury, numbness, tingling, weakness, complex regional pain syndrome, recurrent stenosis and/or disc herniation, vascular injury, development of instability, neck/back pain, need for further surgery, persistent symptoms, development of deformity, and the risks of anesthesia. The patient understood these risks and agreed to proceed.  Operative Note:   Procedure:  1) Anterior cervical diskectomy and fusion at C5-6 2) Anterior cervical instrumentation at C5-6 using Globus Xtend 3) Insertion of biomechanical device at C5-6   Procedure: After obtaining informed consent, the patient taken to the operating room, placed in supine position, general anesthesia induced.  The patient had a small shoulder roll placed behind their shoulders.  The patient received preop antibiotics and IV Decadron.  The patient had a neck incision outlined, was prepped and draped in usual sterile fashion. The incision was injected with local anesthetic.   An incision was opened, dissection taken down medial to the carotid artery and jugular vein, lateral to the trachea and esophagus.  The prevertebral fascia was identified, and a localizing x-ray demonstrated the correct level.  The longus colli were dissected laterally, and self-retaining retractors placed to open the operative field. The microscope was then brought into the field.  With this complete, distractor pins were placed in the vertebral bodies of C5 and C6.  The distractor was placed, and the anulus at C5/6 was opened using a bovie.  Curettes and pituitary rongeurs used to remove the majority of disk, then the drill was used to remove the posterior osteophyte and begin the foraminotomies. The nerve hook was used to elevate the posterior longitudinal ligament, which was then removed with Kerrison rongeurs. A large disc herniation was removed. Bilateral foraminotomies were performed. The microblunt nerve hook could be passed out the foramen bilaterally.   Meticulous hemostasis was obtained.  A biomechanical device (Globus Hedron C 8 mm height x 14 mm width by 12 mm depth) was placed at C5/6. The device had been filled with allograft for aid in arthrodesis.  The caspar distractor was removed, and bone wax used for hemostasis. A 12 mm Globus Xtend plate was chosen.  Two screws placed in each vertebral body, respectively making sure the screws were behind the locking mechanism.  Final AP and lateral radiographs were taken.   With everything in good position, the wound was irrigated copiously with bacitracin-containing solution and meticulous hemostasis obtained.  Wound was closed in 2 layers using interrupted inverted 3-0 Vicryl sutures.  The wound was dressed with steri-strips, the head of bed at 30 degrees, taken to recovery room in stable condition.  No new postop neurological deficits were identified.  Sponge and pattie counts were correct at the end of the procedure.   I performed the entire procedure.   Monitoring was stable throughout.  Meade Maw MD

## 2020-10-20 NOTE — Progress Notes (Signed)
Pt returned from PACU, complains of pain 10/10, oxycodone given , as ordered. Soft collar applied, educated pt on wearing it at all times. Pt assisted to restroom, urinated only. Pt is verbally upset, stating to his girlfriend he is tired and hurting. He wants the BP cuff off and collar off. RN re-educated on keeping the collar on, signs of swelling in the neck to call RN. Girlfriend at bedside and educated on collar. Pt refuses telemetry being applied and the bed alarm on. He is sitting on the bedside. RN asked pt to call her when he wants to get up, even if girlfriend is there so we can monitor for falls.

## 2020-10-20 NOTE — Plan of Care (Signed)

## 2020-10-20 NOTE — Anesthesia Preprocedure Evaluation (Signed)
Anesthesia Evaluation  Patient identified by MRN, date of birth, ID band Patient awake    Reviewed: Allergy & Precautions, NPO status , Patient's Chart, lab work & pertinent test results  History of Anesthesia Complications Negative for: history of anesthetic complications  Airway Mallampati: II  TM Distance: >3 FB Neck ROM: Limited    Dental  (+) Poor Dentition, Chipped   Pulmonary neg sleep apnea, neg COPD, Current Smoker and Patient abstained from smoking.,    breath sounds clear to auscultation- rhonchi (-) wheezing      Cardiovascular Exercise Tolerance: Good (-) hypertension(-) CAD, (-) Past MI, (-) Cardiac Stents and (-) CABG  Rhythm:Regular Rate:Normal - Systolic murmurs and - Diastolic murmurs    Neuro/Psych neg Seizures negative neurological ROS  negative psych ROS   GI/Hepatic negative GI ROS, Neg liver ROS,   Endo/Other  negative endocrine ROSneg diabetes  Renal/GU negative Renal ROS     Musculoskeletal negative musculoskeletal ROS (+)   Abdominal (+) - obese,   Peds  Hematology negative hematology ROS (+)   Anesthesia Other Findings Past Medical History: No date: Hodgkin's lymphoma (HCC) No date: Tobacco use'   Reproductive/Obstetrics                             Anesthesia Physical Anesthesia Plan  ASA: II  Anesthesia Plan: General   Post-op Pain Management:    Induction: Intravenous  PONV Risk Score and Plan: 0 and Ondansetron  Airway Management Planned: Oral ETT  Additional Equipment:   Intra-op Plan:   Post-operative Plan: Extubation in OR  Informed Consent: I have reviewed the patients History and Physical, chart, labs and discussed the procedure including the risks, benefits and alternatives for the proposed anesthesia with the patient or authorized representative who has indicated his/her understanding and acceptance.     Dental advisory given  Plan  Discussed with: CRNA and Anesthesiologist  Anesthesia Plan Comments:         Anesthesia Quick Evaluation

## 2020-10-20 NOTE — TOC Progression Note (Signed)
Transition of Care [facility]) - Progression Note    Patient Details  Name: Preston [name] MRN: [id number] Date of Birth: 09/07/1985  Transition of Care [facility]) CM/SW [place], LCSW Phone Number: 10/20/2020, 1:46 PM  Clinical Narrative:   Update from [place] that [employer] will be helping this patient to apply for Medicaid.    Expected Discharge Plan: Home/Self Care Barriers to Discharge: Continued Medical Work up  Expected Discharge Plan and Services Expected Discharge Plan: Home/Self Care       Living arrangements for the past 2 months: Single Family Home                                       Social Determinants of Health (SDOH) Interventions    Readmission Risk Interventions No flowsheet data found.

## 2020-10-20 NOTE — Anesthesia Postprocedure Evaluation (Signed)
Anesthesia Post Note  Patient: Yona Stansbury  Procedure(s) Performed: ANTERIOR CERVICAL DECOMPRESSION/DISCECTOMY FUSION 1 LEVEL C5-C6 (N/A Spine Cervical)  Patient location during evaluation: PACU Anesthesia Type: General Level of consciousness: awake and alert and oriented Pain management: pain level controlled Vital Signs Assessment: post-procedure vital signs reviewed and stable Respiratory status: spontaneous breathing, nonlabored ventilation and respiratory function stable Cardiovascular status: blood pressure returned to baseline and stable Postop Assessment: no signs of nausea or vomiting Anesthetic complications: no   No complications documented.   Last Vitals:  Vitals:   10/20/20 1100 10/20/20 1142  BP: (!) 168/108 (!) 154/95  Pulse: (!) 59 78  Resp: 19 16  Temp: 36.8 C 36.7 C  SpO2: 100% 100%    Last Pain:  Vitals:   10/20/20 1100  TempSrc:   PainSc: 10-Worst pain ever                 Arella Blinder

## 2020-10-20 NOTE — Plan of Care (Signed)

## 2020-10-21 ENCOUNTER — Encounter: Payer: Self-pay | Admitting: *Deleted

## 2020-10-21 ENCOUNTER — Inpatient Hospital Stay: Payer: Self-pay

## 2020-10-21 DIAGNOSIS — Y908 Blood alcohol level of 240 mg/100 ml or more: Secondary | ICD-10-CM

## 2020-10-21 LAB — BASIC METABOLIC PANEL
Anion gap: 11 (ref 5–15)
BUN: 5 mg/dL — ABNORMAL LOW (ref 6–20)
CO2: 24 mmol/L (ref 22–32)
Calcium: 9.8 mg/dL (ref 8.9–10.3)
Chloride: 98 mmol/L (ref 98–111)
Creatinine, Ser: 0.67 mg/dL (ref 0.61–1.24)
GFR, Estimated: >60 mL/min (ref >60)
Glucose, Bld: 98 mg/dL (ref 70–99)
Potassium: 3.5 mmol/L (ref 3.5–5.1)
Sodium: 133 mmol/L — ABNORMAL LOW (ref 135–145)

## 2020-10-21 LAB — CBC
HCT: 39.4 % (ref 39.0–52.0)
Hemoglobin: 14.6 g/dL (ref 13.0–17.0)
MCH: 32.3 pg (ref 26.0–34.0)
MCHC: 37.1 g/dL — ABNORMAL HIGH (ref 30.0–36.0)
MCV: 87.2 fL (ref 80.0–100.0)
Platelets: 258 10*3/uL (ref 150–400)
RBC: 4.52 MIL/uL (ref 4.22–5.81)
RDW: 12.3 % (ref 11.5–15.5)
WBC: 4.9 10*3/uL (ref 4.0–10.5)
nRBC: 0 % (ref 0.0–0.2)

## 2020-10-21 IMAGING — CR DG CERVICAL SPINE 2 OR 3 VIEWS
1 series · 2 of 2 positions shown · non-contrast
Comparison: Intraoperative cervical radiographs [DATE];
cervical MRI [DATE].

CLINICAL DATA: Status post anterior discectomy and fusion

EXAM:
CERVICAL SPINE - 2-3 VIEW

[Series 1: dg cervical spine 2 or 3 views · 0.14mm/px · 2 of 2 slices shown]
[im 1/2]
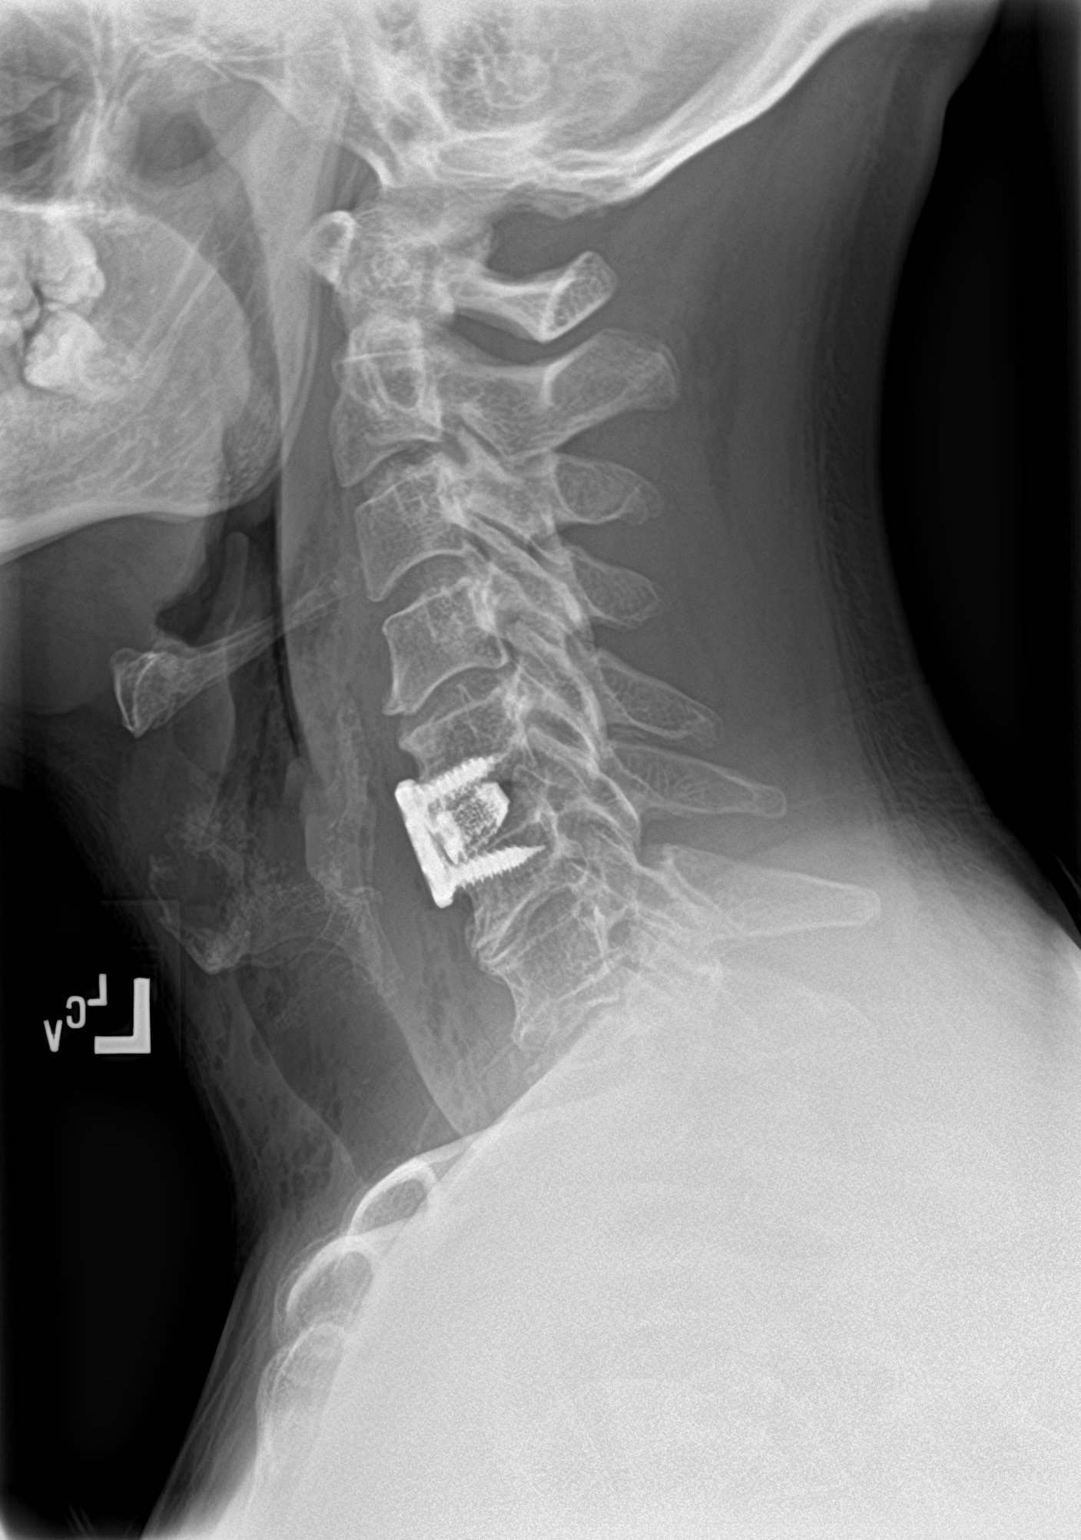
[im 2/2]
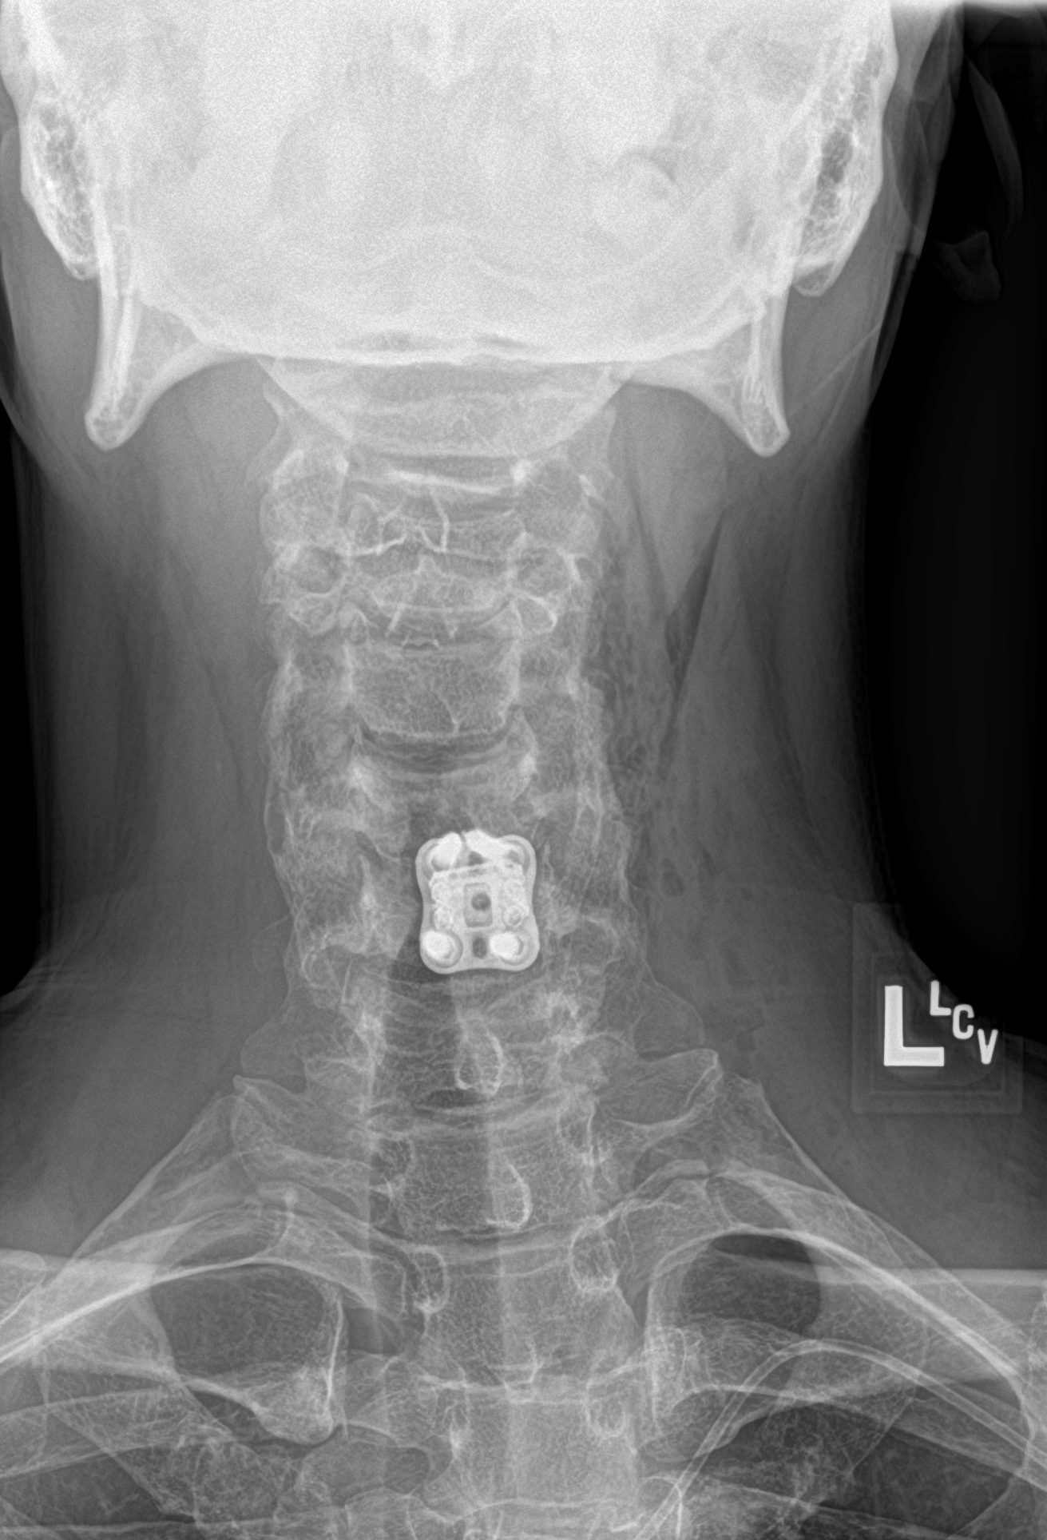

[2 of 2 positions shown; findings below may reference images not displayed]

FINDINGS: Frontal and lateral views were obtained. Patient is status post
anterior screw and plate fixation at C5 and C6 with disc spacer at
C5-6. Soft tissue air on the left consistent with recent surgery. No
fracture or spondylolisthesis. Mild prevertebral soft tissue
thickening is likely due to recent surgery. Predental space region
appears normal. There is moderate disc space narrowing at C6-7 and
C7-T1. There is slight disc space narrowing at C4-5. No erosive
change. Lung apices are clear.
IMPRESSION: Status post anterior screw and plate fixation at C5 and C6 with disc
spacer at C5-6. Support hardware intact. Postoperative changes noted
with soft tissue air on the left. Prevertebral soft tissue
thickening is likely due to recent surgery.

Areas of osteoarthritic change with disc space narrowing most severe
at C6-7 and C7-T1. No fracture or spondylolisthesis.

## 2020-10-21 MED ORDER — HEPARIN SOD (PORK) LOCK FLUSH 100 UNIT/ML IV SOLN
500.0000 [IU] | Freq: Once | INTRAVENOUS | Status: AC
  Administered 2020-10-21: 500 [IU] via INTRAVENOUS
  Filled 2020-10-21: qty 5

## 2020-10-21 MED ORDER — METHOCARBAMOL 500 MG PO TABS: 500.0000 mg | ORAL_TABLET | Freq: Four times a day (QID) | ORAL | 0 refills | Status: DC | PRN

## 2020-10-21 MED ORDER — OXYCODONE-ACETAMINOPHEN 5-325 MG PO TABS: 1.0000 | ORAL_TABLET | ORAL | 0 refills | Status: DC | PRN

## 2020-10-21 NOTE — Discharge Summary (Addendum)
Physician Discharge Summary  Preston [name] GMW:[id number] DOB: 07-23-1986 DOA: 10/17/2020  PCP: Patient, No Pcp Per  Admit date: 10/17/2020 Discharge date: 10/21/2020  Admitted From: Home Disposition:  Home  Recommendations for Outpatient Follow-up:  1. Follow up with PCP in 1-2 weeks 2. Please obtain BMP/CBC in one week   Home Health:No Equipment/Devices:None  Discharge Condition:Stable CODE STATUS:Full Diet recommendation: Heart Healthy   Brief/Interim Summary:  35 y.o. male past medical history of Hodgkin's lymphoma with chemotherapy about 2 weeks prior to admission continue complaining of weakness on his right side which has been worsening.  Neurosurgery insulted who recommended surgical intervention.  Discharge Diagnoses:  Principal Problem:   Spinal stenosis of cervical region, severe Active Problems:   Malignant lymphoma, Hodgkin's type (HCC)   Elevated blood alcohol level   Limb weakness   Excoriation of multiple sites   Cervical stenosis of spinal canal Cervical spinal stenosis: MRI was done that showed mass-effect a C5-C6 neurosurgery was consulted to perform surgical intervention on 10/20/2020 he was covered with narcotics changed to oral narcotics for home. He will follow up with neurosurgery as an outpatient.  Malignant lymphoma, Hodgkin's type [facility]): Missed his chemotherapy on 10/17/2020 last chemotherapy [facility] more than 2 weeks ago. No metastatic lesions seen on MRI.  Elevated blood alcohol level Alcohol level 200, no signs of withdrawal. Continue thiamine.   Discharge Instructions  Discharge Instructions    Diet - low sodium heart healthy   Complete by: As directed    Increase activity slowly   Complete by: As directed    No wound care   Complete by: As directed      Allergies as of 10/21/2020   No Known Allergies     Medication List    STOP taking these medications   celecoxib 100 MG capsule Commonly known as: CeleBREX    lidocaine-prilocaine cream Commonly known as: EMLA     TAKE these medications   acetaminophen 500 MG tablet Commonly known as: TYLENOL Take 1,000 mg by mouth every 6 (six) hours as needed for mild pain or moderate pain.   ibuprofen 200 MG tablet Commonly known as: ADVIL Take 600-800 mg by mouth every 6 (six) hours as needed for mild pain or moderate pain.   methocarbamol 500 MG tablet Commonly known as: ROBAXIN Take 1 tablet (500 mg total) by mouth every 6 (six) hours as needed for muscle spasms.   omeprazole 20 MG capsule Commonly known as: PRILOSEC Take 1 capsule (20 mg total) by mouth daily. What changed:   when to take this  reasons to take this   ondansetron 8 MG tablet Commonly known as: ZOFRAN Take 1 tablet (8 mg total) by mouth 2 (two) times daily.   oxyCODONE-acetaminophen 5-325 MG tablet Commonly known as: Percocet Take 1-2 tablets by mouth every 4 (four) hours as needed for moderate pain or severe pain. What changed:   how much to take  reasons to take this   prochlorperazine 10 MG tablet Commonly known as: COMPAZINE Take 1 tablet (10 mg total) by mouth every 6 (six) hours as needed for nausea or vomiting.       Follow-up Information    Meade Maw, MD Follow up in 6 week(s).   Specialty: Neurosurgery Why: already scheduled Contact information: [place] [address] [phone]              No Known Allergies  Consultations:  Neurosurgery   Procedures/Studies: DG Chest 2 View  Result Date:  10/18/2020 CLINICAL DATA:  Left shoulder pain, generalized weakness, lymphoma EXAM: CHEST - 2 VIEW COMPARISON:  08/07/2020, CT 08/07/2020 FINDINGS: Lungs are clear. No pneumothorax or pleural effusion. Right paratracheal and suprahilar soft tissue thickening is unchanged, better appreciated on prior CT examination and related to the anterior mediastinal mass. Left upper extremity PICC line tip noted within the superior  vena cava. Cardiac size within normal limits. Pulmonary vascularity is normal. No acute bone abnormality. IMPRESSION: Unchanged anterior mediastinal mass. No radiographic evidence of acute cardiopulmonary disease. Electronically Signed   By: Fidela Salisbury MD   On: 10/18/2020 00:42   DG Cervical Spine 2 or 3 views  Result Date: 10/21/2020 CLINICAL DATA:  Status post anterior discectomy and fusion EXAM: CERVICAL SPINE - 2-3 VIEW COMPARISON:  Intraoperative cervical radiographs October 20, 2020; cervical MRI October 18, 2020. FINDINGS: Frontal and lateral views were obtained. Patient is status post anterior screw and plate fixation at C5 and C6 with disc spacer at C5-6. Soft tissue air on the left consistent with recent surgery. No fracture or spondylolisthesis. Mild prevertebral soft tissue thickening is likely due to recent surgery. Predental space region appears normal. There is moderate disc space narrowing at C6-7 and C7-T1. There is slight disc space narrowing at C4-5. No erosive change. Lung apices are clear. IMPRESSION: Status post anterior screw and plate fixation at C5 and C6 with disc spacer at C5-6. Support hardware intact. Postoperative changes noted with soft tissue air on the left. Prevertebral soft tissue thickening is likely due to recent surgery. Areas of osteoarthritic change with disc space narrowing most severe at C6-7 and C7-T1. No fracture or spondylolisthesis. Electronically Signed   By: Lowella Grip III M.D.   On: 10/21/2020 08:51   DG Cervical Spine 2-3 Views  Result Date: 10/20/2020 CLINICAL DATA:  35 year old male with bulky C5-C6 disc extrusion, spinal stenosis and cord mass effect. EXAM: CERVICAL SPINE - 2-3 VIEW; DG C-ARM 1-60 MIN COMPARISON:  Cervical spine MRI 10/18/2020. FINDINGS: Two intraoperative fluoroscopic spot views of the cervical spine in the AP and lateral projection. New C5-C6 ACDF hardware. Improved cervical lordosis. Intubated. Small sponge marker seen along the  anterior neck. IMPRESSION: Intraoperative images of C5-C6 ACDF. Electronically Signed   By: Genevie Ann M.D.   On: 10/20/2020 10:55   CT Head Wo Contrast  Result Date: 10/17/2020 CLINICAL DATA:  Neurologic deficit, lymphoma currently undergoing chemotherapy, bilateral hand and foot weakness EXAM: CT HEAD WITHOUT CONTRAST TECHNIQUE: Contiguous axial images were obtained from the base of the skull through the vertex without intravenous contrast. COMPARISON:  08/23/2020 FINDINGS: Brain: No acute infarct or hemorrhage. Lateral ventricles and midline structures are unremarkable. No acute extra-axial fluid collections. No mass effect. Vascular: No hyperdense vessel or unexpected calcification. Skull: Normal. Negative for fracture or focal lesion. Sinuses/Orbits: No acute finding. Other: None. IMPRESSION: 1. No acute intracranial process. Electronically Signed   By: Randa Ngo M.D.   On: 10/17/2020 23:56   MR Brain W and Wo Contrast  Result Date: 10/18/2020 CLINICAL DATA:  35 year old male with new onset weakness, loss of bilateral hand grip, weakness in both feet. History of stage III lymphoma with most recent chemotherapy 2 weeks ago. EXAM: MRI HEAD WITHOUT AND WITH CONTRAST TECHNIQUE: Multiplanar, multiecho pulse sequences of the brain and surrounding structures were obtained without and with intravenous contrast. CONTRAST:  49mL GADAVIST GADOBUTROL 1 MMOL/ML IV SOLN COMPARISON:  Cervical spine MRI today reported separately. Head CT yesterday. FINDINGS: Brain: Normal cerebral volume. No restricted  diffusion to suggest acute infarction. No midline shift, mass effect, evidence of mass lesion, ventriculomegaly, extra-axial collection or acute intracranial hemorrhage. Cervicomedullary junction and pituitary are within normal limits. Axial FLAIR imaging degraded by motion despite repeated imaging attempts. Otherwise gray and white matter signal is within normal limits throughout the brain. No encephalomalacia or chronic  cerebral blood products identified. No abnormal enhancement identified.  No dural thickening. Vascular: Major intracranial vascular flow voids are preserved. The major dural venous sinuses are enhancing and appear to be patent. Skull and upper cervical spine: Cervical spine detailed separately today. Normal visible bone marrow signal. Sinuses/Orbits: Negative orbits. Trace paranasal sinus mucosal thickening. Other: Mastoids are well aerated. Grossly normal visible internal auditory structures. Right superior convexity scalp soft tissue swelling and edema (series 20, image 48 and series 26, image 12). Normal underlying calvarium marrow signal. Other scalp and face soft tissues appear normal. IMPRESSION: 1. Normal MRI appearance of the brain. No acute intracranial abnormality. 2. Nonspecific right superior convexity scalp soft tissue swelling and edema. Query recent trauma/fall. 3. See Cervical Spine MRI reported separately. Electronically Signed   By: Genevie Ann M.D.   On: 10/18/2020 04:26   MR Cervical Spine W or Wo Contrast  Addendum Date: 10/18/2020   ADDENDUM REPORT: 10/18/2020 04:51 ADDENDUM: Study discussed by telephone with Dr. Hulan Saas on 10/18/2020 at 0447 hours. He is already in discussion with Neurosurgery. Electronically Signed   By: Genevie Ann M.D.   On: 10/18/2020 04:51   Result Date: 10/18/2020 CLINICAL DATA:  35 year old male with new onset weakness, loss of bilateral hand grip, weakness in both feet. History of stage III lymphoma with most recent chemotherapy 2 weeks ago. EXAM: MRI CERVICAL SPINE WITHOUT AND WITH CONTRAST TECHNIQUE: Multiplanar and multiecho pulse sequences of the cervical spine, to include the craniocervical junction and cervicothoracic junction, were obtained without and with intravenous contrast. CONTRAST:  52mL GADAVIST GADOBUTROL 1 MMOL/ML IV SOLN in conjunction with contrast enhanced imaging of the brain reported separately. COMPARISON:  Brain MRI today reported separately.  Thoracic and lumbar MRI 0302 hours today. FINDINGS: Alignment: Straightening and mild reversal of cervical lordosis. No significant spondylolisthesis. Vertebrae: Minimal degenerative endplate marrow edema at C5-C6. Normal background bone marrow signal, with no suspicious osseous lesion. Cord: Abnormal. Bulky disc extrusion at C5-C6 results in mass effect on the spinal cord and abnormal central cord signal (series 7, image 9). The abnormal increased T2 cord signal is eccentric to the left just above the disc (series 8, image 16). See additional details below. No definite cord enhancement. Above the C4-C5 disc and below C6 the visible spinal cord appears within normal limits. No dural thickening. Posterior Fossa, vertebral arteries, paraspinal tissues: Cervicomedullary junction is within normal limits. Brain detailed separately today. Preserved major vascular flow voids in the neck. Negative visible neck soft tissues, right lung apex. Disc levels: C2-C3: Mild facet hypertrophy. Mild bilateral C3 neural foraminal stenosis. C3-C4: Right paracentral disc protrusion (series 8, image 9). Mild spinal stenosis. Subtle cord mass effect. Additional right eccentric disc bulging and endplate spurring with mild facet hypertrophy results in mild left and mild to moderate right C4 foraminal stenosis. C4-C5: Mildly lobulated right eccentric circumferential disc bulge and endplate spurring. Mild spinal stenosis. No cord mass effect. Mild right C5 foraminal stenosis. C5-C6: Bulky left paracentral disc extrusion (series 5, image 9 and series 8, image 19). Moderate to severe spinal stenosis and cord mass effect with abnormal cord signal as stated above. Additional disc bulging and endplate  spurring at this level. Mild left but moderate to severe right C6 foraminal stenosis. C6-C7: Left eccentric circumferential disc bulge and endplate spurring. No significant spinal stenosis. Mild-to-moderate left C7 neural foraminal stenosis. C7-T1:  Foraminal, far lateral disc bulge and endplate spurring. Mild ligament flavum hypertrophy. No spinal stenosis. But there is moderate to severe bilateral C8 neural foraminal stenosis. Stable visible upper thoracic levels. IMPRESSION: 1. Bulky degenerative disc extrusion at C5-C6 with severe spinal stenosis, spinal cord mass effect and abnormal cord signal (edema and/or myelomalacia). Moderate to severe multifactorial right C6 foraminal stenosis. 2. Smaller disc herniation at C3-C4, and lobulated disc bulging at C4-C5 with up to mild spinal stenosis at those levels. Up to moderate degenerative right C4, left C7, and moderate to severe bilateral C8 neural foraminal stenosis. 3. No metastatic disease identified in the cervical spine. Electronically Signed: By: Genevie Ann M.D. On: 10/18/2020 04:34   MR THORACIC SPINE W WO CONTRAST  Result Date: 10/18/2020 CLINICAL DATA:  Stage 3 lymphoma.  New onset weakness. EXAM: MRI THORACIC AND LUMBAR SPINE WITHOUT AND WITH CONTRAST TECHNIQUE: Multiplanar and multiecho pulse sequences of the thoracic and lumbar spine were obtained without and with intravenous contrast. CONTRAST:  71mL GADAVIST GADOBUTROL 1 MMOL/ML IV SOLN COMPARISON:  None. FINDINGS: MRI THORACIC SPINE FINDINGS Alignment:  Physiologic. Vertebrae: No fracture, evidence of discitis, or bone lesion. Cord: Normal signal and morphology. No abnormal contrast enhancement. Paraspinal and other soft tissues: Large amount of anterior mediastinal lymphadenopathy, incompletely visualized. Disc levels: No spinal canal stenosis. MRI LUMBAR SPINE FINDINGS Segmentation:  Standard. Alignment:  Physiologic. Vertebrae:  No fracture, evidence of discitis, or bone lesion. Conus medullaris: Extends to the L1 level and appears normal. No abnormal contrast enhancement. Paraspinal and other soft tissues: Negative. Disc levels: Small left L5-S1 extraforaminal disc protrusion. No spinal canal or neural foraminal stenosis. IMPRESSION: 1. No  evidence of metastatic disease to the thoracic or lumbar spine. 2. Partially visualized anterior mediastinal lymphadenopathy, consistent with lymphoma. 3. Small left L5-S1 extraforaminal disc protrusion could irritate the left L5 nerve root. Electronically Signed   By: Ulyses Jarred M.D.   On: 10/18/2020 04:00   MR Lumbar Spine W Wo Contrast  Result Date: 10/18/2020 CLINICAL DATA:  Stage 3 lymphoma.  New onset weakness. EXAM: MRI THORACIC AND LUMBAR SPINE WITHOUT AND WITH CONTRAST TECHNIQUE: Multiplanar and multiecho pulse sequences of the thoracic and lumbar spine were obtained without and with intravenous contrast. CONTRAST:  47mL GADAVIST GADOBUTROL 1 MMOL/ML IV SOLN COMPARISON:  None. FINDINGS: MRI THORACIC SPINE FINDINGS Alignment:  Physiologic. Vertebrae: No fracture, evidence of discitis, or bone lesion. Cord: Normal signal and morphology. No abnormal contrast enhancement. Paraspinal and other soft tissues: Large amount of anterior mediastinal lymphadenopathy, incompletely visualized. Disc levels: No spinal canal stenosis. MRI LUMBAR SPINE FINDINGS Segmentation:  Standard. Alignment:  Physiologic. Vertebrae:  No fracture, evidence of discitis, or bone lesion. Conus medullaris: Extends to the L1 level and appears normal. No abnormal contrast enhancement. Paraspinal and other soft tissues: Negative. Disc levels: Small left L5-S1 extraforaminal disc protrusion. No spinal canal or neural foraminal stenosis. IMPRESSION: 1. No evidence of metastatic disease to the thoracic or lumbar spine. 2. Partially visualized anterior mediastinal lymphadenopathy, consistent with lymphoma. 3. Small left L5-S1 extraforaminal disc protrusion could irritate the left L5 nerve root. Electronically Signed   By: Ulyses Jarred M.D.   On: 10/18/2020 04:00   DG C-Arm 1-60 Min  Result Date: 10/20/2020 CLINICAL DATA:  35 year old male with bulky C5-C6 disc  extrusion, spinal stenosis and cord mass effect. EXAM: CERVICAL SPINE - 2-3  VIEW; DG C-ARM 1-60 MIN COMPARISON:  Cervical spine MRI 10/18/2020. FINDINGS: Two intraoperative fluoroscopic spot views of the cervical spine in the AP and lateral projection. New C5-C6 ACDF hardware. Improved cervical lordosis. Intubated. Small sponge marker seen along the anterior neck. IMPRESSION: Intraoperative images of C5-C6 ACDF. Electronically Signed   By: Genevie Ann M.D.   On: 10/20/2020 10:55     Subjective: No new complaints  Discharge Exam: Vitals:   10/20/20 1530 10/21/20 0851  BP: 136/88 (!) 126/93  Pulse: 84 99  Resp: 16 16  Temp: (!) 97.5 F (36.4 C) 98.3 F (36.8 C)  SpO2: 99% 97%   Vitals:   10/20/20 1331 10/20/20 1440 10/20/20 1530 10/21/20 0851  BP: (!) 134/96 (!) 139/98 136/88 (!) 126/93  Pulse: 87 86 84 99  Resp: 17 17 16 16   Temp: 97.8 F (36.6 C) 98.1 F (36.7 C) (!) 97.5 F (36.4 C) 98.3 F (36.8 C)  TempSrc:   Oral Oral  SpO2: 100% 100% 99% 97%  Weight:      Height:        General: Pt is alert, awake, not in acute distress Cardiovascular: RRR, S1/S2 +, no rubs, no gallops Respiratory: CTA bilaterally, no wheezing, no rhonchi Abdominal: Soft, NT, ND, bowel sounds + Extremities: no edema, no cyanosis    The results of significant diagnostics from this hospitalization (including imaging, microbiology, ancillary and laboratory) are listed below for reference.     Microbiology: Recent Results (from the past 240 hour(s))  Resp Panel by RT-PCR (Flu A&B, Covid) Nasopharyngeal Swab     Status: None   Collection Time: 10/18/20 12:54 AM   Specimen: Nasopharyngeal Swab; Nasopharyngeal(NP) swabs in vial transport medium  Result Value Ref Range Status   SARS Coronavirus 2 by RT PCR NEGATIVE NEGATIVE Final    Comment: (NOTE) SARS-CoV-2 target nucleic acids are NOT DETECTED.  The SARS-CoV-2 RNA is generally detectable in upper respiratory specimens during the acute phase of infection. The lowest concentration of SARS-CoV-2 viral copies this assay can  detect is 138 copies/mL. A negative result does not preclude SARS-Cov-2 infection and should not be used as the sole basis for treatment or other patient management decisions. A negative result may occur with  improper specimen collection/handling, submission of specimen other than nasopharyngeal swab, presence of viral mutation(s) within the areas targeted by this assay, and inadequate number of viral copies(<138 copies/mL). A negative result must be combined with clinical observations, patient history, and epidemiological information. The expected result is Negative.  Fact Sheet for Patients:  EntrepreneurPulse.com.au  Fact Sheet for Healthcare Providers:  IncredibleEmployment.be  This test is no t yet approved or cleared by the [place] FDA and  has been authorized for detection and/or diagnosis of SARS-CoV-2 by FDA under an Emergency Use Authorization (EUA). This EUA will remain  in effect (meaning this test can be used) for the duration of the COVID-19 declaration under Section 564(b)(1) of the Act, 21 U.S.C.section 360bbb-3(b)(1), unless the authorization is terminated  or revoked sooner.       Influenza A by PCR NEGATIVE NEGATIVE Final   Influenza B by PCR NEGATIVE NEGATIVE Final    Comment: (NOTE) The Xpert Xpress SARS-CoV-2/FLU/RSV plus assay is intended as an aid in the diagnosis of influenza from Nasopharyngeal swab specimens and should not be used as a sole basis for treatment. Nasal washings and aspirates are unacceptable for Xpert Xpress SARS-CoV-2/FLU/RSV  testing.  Fact Sheet for Patients: EntrepreneurPulse.com.au  Fact Sheet for Healthcare Providers: IncredibleEmployment.be  This test is not yet approved or cleared by the [place] FDA and has been authorized for detection and/or diagnosis of SARS-CoV-2 by FDA under an Emergency Use Authorization (EUA). This EUA will remain in  effect (meaning this test can be used) for the duration of the COVID-19 declaration under Section 564(b)(1) of the Act, 21 U.S.C. section 360bbb-3(b)(1), unless the authorization is terminated or revoked.  Performed at [facility], [address] [place], [place] [address]      Labs: BNP (last 3 results) Recent Labs    08/07/20 1245  BNP 24.5   Basic Metabolic Panel: Recent Labs  Lab 10/17/20 2325 10/18/20 0850 10/21/20 0538  NA 138  --  133*  K 3.5  --  3.5  CL 101  --  98  CO2 21*  --  24  GLUCOSE 88  --  98  BUN 6  --  5*  CREATININE 0.67  --  0.67  CALCIUM 9.5  --  9.8  MG 2.3  --   --   PHOS  --  4.3  --    Liver Function Tests: Recent Labs  Lab 10/17/20 2325  AST 40  ALT 39  ALKPHOS 54  BILITOT 0.7  PROT 8.6*  ALBUMIN 4.9   No results for input(s): LIPASE, AMYLASE in the last 168 hours. No results for input(s): AMMONIA in the last 168 hours. CBC: Recent Labs  Lab 10/17/20 2325 10/20/20 0422 10/21/20 0538  WBC 3.6* 3.2* 4.9  NEUTROABS 1.4*  --   --   HGB 14.6 14.7 14.6  HCT 40.0 40.5 39.4  MCV 87.7 88.0 87.2  PLT 237 251 258   Cardiac Enzymes: No results for input(s): CKTOTAL, CKMB, CKMBINDEX, TROPONINI in the last 168 hours. BNP: Invalid input(s): POCBNP CBG: No results for input(s): GLUCAP in the last 168 hours. D-Dimer No results for input(s): DDIMER in the last 72 hours. Hgb A1c No results for input(s): HGBA1C in the last 72 hours. Lipid Profile No results for input(s): CHOL, HDL, LDLCALC, TRIG, CHOLHDL, LDLDIRECT in the last 72 hours. Thyroid function studies No results for input(s): TSH, T4TOTAL, T3FREE, THYROIDAB in the last 72 hours.  Invalid input(s): FREET3 Anemia work up No results for input(s): VITAMINB12, FOLATE, FERRITIN, TIBC, IRON, RETICCTPCT in the last 72 hours. Urinalysis No results found for: COLORURINE, APPEARANCEUR, [place], [place], GLUCOSEU, [place], [place], [place], PROTEINUR, UROBILINOGEN,  NITRITE, LEUKOCYTESUR Sepsis Labs Invalid input(s): PROCALCITONIN,  WBC,  LACTICIDVEN Microbiology Recent Results (from the past 240 hour(s))  Resp Panel by RT-PCR (Flu A&B, Covid) Nasopharyngeal Swab     Status: None   Collection Time: 10/18/20 12:54 AM   Specimen: Nasopharyngeal Swab; Nasopharyngeal(NP) swabs in vial transport medium  Result Value Ref Range Status   SARS Coronavirus 2 by RT PCR NEGATIVE NEGATIVE Final    Comment: (NOTE) SARS-CoV-2 target nucleic acids are NOT DETECTED.  The SARS-CoV-2 RNA is generally detectable in upper respiratory specimens during the acute phase of infection. The lowest concentration of SARS-CoV-2 viral copies this assay can detect is 138 copies/mL. A negative result does not preclude SARS-Cov-2 infection and should not be used as the sole basis for treatment or other patient management decisions. A negative result may occur with  improper specimen collection/handling, submission of specimen other than nasopharyngeal swab, presence of viral mutation(s) within the areas targeted by this assay, and inadequate number of viral copies(<138 copies/mL).  A negative result must be combined with clinical observations, patient history, and epidemiological information. The expected result is Negative.  Fact Sheet for Patients:  EntrepreneurPulse.com.au  Fact Sheet for Healthcare Providers:  IncredibleEmployment.be  This test is no t yet approved or cleared by the [place] FDA and  has been authorized for detection and/or diagnosis of SARS-CoV-2 by FDA under an Emergency Use Authorization (EUA). This EUA will remain  in effect (meaning this test can be used) for the duration of the COVID-19 declaration under Section 564(b)(1) of the Act, 21 U.S.C.section 360bbb-3(b)(1), unless the authorization is terminated  or revoked sooner.       Influenza A by PCR NEGATIVE NEGATIVE Final   Influenza B by PCR NEGATIVE  NEGATIVE Final    Comment: (NOTE) The Xpert Xpress SARS-CoV-2/FLU/RSV plus assay is intended as an aid in the diagnosis of influenza from Nasopharyngeal swab specimens and should not be used as a sole basis for treatment. Nasal washings and aspirates are unacceptable for Xpert Xpress SARS-CoV-2/FLU/RSV testing.  Fact Sheet for Patients: EntrepreneurPulse.com.au  Fact Sheet for Healthcare Providers: IncredibleEmployment.be  This test is not yet approved or cleared by the [place] FDA and has been authorized for detection and/or diagnosis of SARS-CoV-2 by FDA under an Emergency Use Authorization (EUA). This EUA will remain in effect (meaning this test can be used) for the duration of the COVID-19 declaration under Section 564(b)(1) of the Act, 21 U.S.C. section 360bbb-3(b)(1), unless the authorization is terminated or revoked.  Performed at [facility], [address] [place], [place] [address]      Time coordinating discharge: Over 30 minutes  SIGNED:   Charlynne Cousins, MD  [facility] 10/21/2020, 12:02 PM Pager   If 7PM-7AM, please contact night-coverage [email or web address] Password [email or web address]

## 2020-10-21 NOTE — Progress Notes (Signed)
Patient refused 0000 and 0400 vital signs.

## 2020-10-21 NOTE — Discharge Instructions (Signed)
Your surgeon has performed an operation on your cervical spine (neck) to relieve pressure on the spinal cord and/or nerves. This involved making an incision in the front of your neck and removing one or more of the discs that support your spine. Next, a small piece of bone, a titanium plate, and screws were used to fuse two or more of the vertebrae (bones) together.  The following are instructions to help in your recovery once you have been discharged from the hospital. Even if you feel well, it is important that you follow these activity guidelines. If you do not let your neck heal properly from the surgery, you can increase the chance of return of your symptoms and other complications.  * Do not take anti-inflammatory medications for 3 months after surgery (naproxen [Aleve], ibuprofen [Advil, Motrin], celecoxib [Celebrex], etc.). These medications can prevent your bones from healing properly.  Activity    No bending, lifting, or twisting ("BLT"). Avoid lifting objects heavier than 10 pounds (gallon milk jug).  Where possible, avoid household activities that involve lifting, bending, reaching, pushing, or pulling such as laundry, vacuuming, grocery shopping, and childcare. Try to arrange for help from friends and family for these activities while your back heals.  Increase physical activity slowly as tolerated.  Taking short walks is encouraged, but avoid strenuous exercise. Do not jog, run, bicycle, lift weights, or participate in any other exercises unless specifically allowed by your doctor.  Talk to your doctor before resuming sexual activity.  You should not drive until cleared by your doctor.  Until released by your doctor, you should not return to work or school.  You should rest at home and let your body heal.   You may shower three days after your surgery.  After showering, lightly dab your incision dry. Do not take a tub bath or go swimming until approved by your doctor at your  follow-up appointment.  If your doctor ordered a cervical collar (neck brace) for you, you should wear it whenever you are out of bed. You may remove it when lying down or sleeping, but you should wear it at all other times. Not all neck surgeries require a cervical collar.  If you smoke, we strongly recommend that you quit.  Smoking has been proven to interfere with normal bone healing and will dramatically reduce the success rate of your surgery. Please contact [facility] (800-QUIT-NOW) and use the resources at www.[facility].com for assistance in stopping smoking.  Surgical Incision   If you have a dressing on your incision, you may remove it two days after your surgery. Keep your incision area clean and dry.  If you have staples or stitches on your incision, you should have a follow up scheduled for removal. If you do not have staples or stitches, you will have steri-strips (small pieces of surgical tape) or Dermabond glue. The steri-strips/glue should begin to peel away within about a week (it is fine if the steri-strips fall off before then). If the strips are still in place one week after your surgery, you may gently remove them.  Diet           You may return to your usual diet. However, you may experience discomfort when swallowing in the first month after your surgery. This is normal. You may find that softer foods are more comfortable for you to swallow. Be sure to stay hydrated.  When to Contact us  You may experience pain in your neck and/or pain between your shoulder  blades. This is normal and should improve in the next few weeks with the help of pain medication, muscle relaxers, and rest. Some patients report that a warm compress on the back of the neck or between the shoulder blades helps.  However, should you experience any of the following, contact us immediately: New numbness or weakness Pain that is progressively getting worse, and is not relieved by your pain medication,  muscle relaxers, rest, and warm compresses Bleeding, redness, swelling, pain, or drainage from surgical incision Chills or flu-like symptoms Fever greater than 101.0 F (38.3 C) Inability to eat, drink fluids, or take medications Problems with bowel or bladder functions Difficulty breathing or shortness of breath Warmth, tenderness, or swelling in your calf Contact Information During office hours (Monday-Friday 9 am to 5 pm), please call your physician at [phone] and ask for Kendelyn Jean After hours and weekends, please call [phone] and speak with the answering service, who will contact the doctor on call.  If that fails, call the [facility] Operator at [phone] and ask for the Neurosurgery Resident On Call  For a life-threatening emergency, call 911   

## 2020-10-21 NOTE — Discharge Planning (Signed)
Removed IV and flushing port w/ heparin (per protocol) to de-access.  Assessment and VS revealed stability for DC to home.  Discharge papers given, explained and educated.  Informed of suggested FU appts and told to leave water proof dressing in place till DC follow up.  Pain scripts given.  Once ready, will be wheeled to front and family transporting to home via car.

## 2020-10-23 ENCOUNTER — Encounter: Payer: Self-pay | Admitting: Neurosurgery

## 2020-10-27 ENCOUNTER — Other Ambulatory Visit: Payer: Self-pay

## 2020-10-27 ENCOUNTER — Ambulatory Visit: Payer: Self-pay

## 2020-10-27 ENCOUNTER — Ambulatory Visit: Payer: Self-pay | Admitting: Oncology

## 2020-11-10 ENCOUNTER — Encounter: Payer: Self-pay | Admitting: Oncology

## 2020-11-10 ENCOUNTER — Other Ambulatory Visit: Payer: Self-pay

## 2020-11-10 ENCOUNTER — Inpatient Hospital Stay: Payer: Self-pay

## 2020-11-10 ENCOUNTER — Inpatient Hospital Stay: Payer: Self-pay | Attending: Oncology

## 2020-11-10 ENCOUNTER — Inpatient Hospital Stay (HOSPITAL_BASED_OUTPATIENT_CLINIC_OR_DEPARTMENT_OTHER): Payer: Self-pay | Admitting: Oncology

## 2020-11-10 VITALS — BP 140/90 | HR 87 | Temp 96.5°F | Wt 161.8 lb

## 2020-11-10 DIAGNOSIS — R112 Nausea with vomiting, unspecified: Secondary | ICD-10-CM

## 2020-11-10 DIAGNOSIS — R110 Nausea: Secondary | ICD-10-CM | POA: Insufficient documentation

## 2020-11-10 DIAGNOSIS — C8172 Other classical Hodgkin lymphoma, intrathoracic lymph nodes: Secondary | ICD-10-CM

## 2020-11-10 DIAGNOSIS — M4802 Spinal stenosis, cervical region: Secondary | ICD-10-CM | POA: Insufficient documentation

## 2020-11-10 DIAGNOSIS — Z5111 Encounter for antineoplastic chemotherapy: Secondary | ICD-10-CM

## 2020-11-10 DIAGNOSIS — R634 Abnormal weight loss: Secondary | ICD-10-CM | POA: Insufficient documentation

## 2020-11-10 DIAGNOSIS — M5412 Radiculopathy, cervical region: Secondary | ICD-10-CM | POA: Insufficient documentation

## 2020-11-10 DIAGNOSIS — F17210 Nicotine dependence, cigarettes, uncomplicated: Secondary | ICD-10-CM | POA: Insufficient documentation

## 2020-11-10 LAB — CBC WITH DIFFERENTIAL/PLATELET
Abs Immature Granulocytes: 0.03 10*3/uL (ref 0.00–0.07)
Basophils Absolute: 0 10*3/uL (ref 0.0–0.1)
Basophils Relative: 0 %
Eosinophils Absolute: 0.1 10*3/uL (ref 0.0–0.5)
Eosinophils Relative: 1 %
HCT: 38.3 % — ABNORMAL LOW (ref 39.0–52.0)
Hemoglobin: 14.2 g/dL (ref 13.0–17.0)
Immature Granulocytes: 1 %
Lymphocytes Relative: 23 %
Lymphs Abs: 1.3 10*3/uL (ref 0.7–4.0)
MCH: 32.2 pg (ref 26.0–34.0)
MCHC: 37.1 g/dL — ABNORMAL HIGH (ref 30.0–36.0)
MCV: 86.8 fL (ref 80.0–100.0)
Monocytes Absolute: 0.8 10*3/uL (ref 0.1–1.0)
Monocytes Relative: 13 %
Neutro Abs: 3.6 10*3/uL (ref 1.7–7.7)
Neutrophils Relative %: 62 %
Platelets: 288 10*3/uL (ref 150–400)
RBC: 4.41 MIL/uL (ref 4.22–5.81)
RDW: 13.3 % (ref 11.5–15.5)
WBC: 5.8 10*3/uL (ref 4.0–10.5)
nRBC: 0 % (ref 0.0–0.2)

## 2020-11-10 LAB — COMPREHENSIVE METABOLIC PANEL
ALT: 20 U/L (ref 0–44)
AST: 26 U/L (ref 15–41)
Albumin: 4.6 g/dL (ref 3.5–5.0)
Alkaline Phosphatase: 64 U/L (ref 38–126)
Anion gap: 13 (ref 5–15)
BUN: 9 mg/dL (ref 6–20)
CO2: 23 mmol/L (ref 22–32)
Calcium: 9.5 mg/dL (ref 8.9–10.3)
Chloride: 100 mmol/L (ref 98–111)
Creatinine, Ser: 0.72 mg/dL (ref 0.61–1.24)
GFR, Estimated: >60 mL/min (ref >60)
Glucose, Bld: 95 mg/dL (ref 70–99)
Potassium: 3.8 mmol/L (ref 3.5–5.1)
Sodium: 136 mmol/L (ref 135–145)
Total Bilirubin: 1.7 mg/dL — ABNORMAL HIGH (ref 0.3–1.2)
Total Protein: 8.2 g/dL — ABNORMAL HIGH (ref 6.5–8.1)

## 2020-11-10 LAB — C-REACTIVE PROTEIN: CRP: 0.7 mg/dL (ref <1.0)

## 2020-11-10 MED ORDER — DEXAMETHASONE 4 MG PO TABS: 8.0000 mg | ORAL_TABLET | Freq: Every day | ORAL | 0 refills | Status: DC

## 2020-11-10 MED ORDER — HEPARIN SOD (PORK) LOCK FLUSH 100 UNIT/ML IV SOLN
500.0000 [IU] | Freq: Once | INTRAVENOUS | Status: AC
  Administered 2020-11-10: 500 [IU] via INTRAVENOUS
  Filled 2020-11-10: qty 5

## 2020-11-10 NOTE — Progress Notes (Signed)
Hematology/Oncology follow up note [facility] [phone]) [phone] [phone]   Patient Care Team: Patient, No Pcp Per (Inactive) as PCP - General (General Practice)  REFERRING PROVIDER: Earlie Server, MD  CHIEF COMPLAINTS/REASON FOR VISIT:  Follow up for hodgkin's lymphoma.   HISTORY OF PRESENTING ILLNESS:   Preston [name] is a  35 y.o.  male with PMH listed below was seen in consultation at the request of  Earlie Server, MD  for evaluation of Hodgkin's lymphoma.  Patient has a known diagnosis of Hodgkin's lymphoma and wants to transfer his oncology care to our cancer center.  Extensive medical records reviewed was performed  Previous oncology care was [place] Per note  10/28/2019 biopsy of right chest wall mass, fibromuscular soft tissue with atypical infiltrate consistent with Hodgkin lymphoma, classical type. There were scattered single atypical cells with morphology suggestive of Jaclynn Guarneri variant cells. These cells were reactive with CD 45, CD 15, CD 30 and nonreactive for pan keratin, cam 5.7, CD3, CD5, CD10, CD 20, melan-A, Sox 10. Ki-67 stained majority of Reed-Sternberg like cells.  Noticed right upper chest wall lump in January 2021. He was incarcerated for 27 months, released in May 2021.  Unintentional weight loss about 30 pounds, drenching night sweats.  01/17/2020 PET scan showed bulky anterior mediastinal mass 12.7cm. with extensive adenopathy in neck, right axilla, mediastinum, bilateral hilar regions, upper abdominal ligament adenopathy and retroperitoneal adenopathy. Abnormal hypermetabolic activity within a splenic focus. Mild patchy ground glass opacity in the right lower lobe may reflect postobstructive inflammatory change without hypermetabolic activity.   01/17/2020 Echo 1. Normal biventricular systolic function and size. LVEF 60%. Averageglobal longitudinal strain is normal, -20%  2. No significant valvular  dysfunction.  3. Normal right-and left-sided filling pressures. 4. No prior study for comparison.  12/30/2019 LDH 588 01/24/2020 CRP 67.2, ESR 60  Stage IIIB Hodgkin's lymphoma, he was recommended ABVD x 2 cycles, followed by PET with plan of AVD x 4 or escalate to BEACOPP if not favorable results.   01/26/2020 ABVD Day 1,15 x 2 cycles.   03/24/2020 PET scan showed significant interval response to therapy. All PET positive lesions previously resolved 03/30/2020 AVD D1 04/13/2020 AVD D15  He was not able to have chest medi port placed due to chest mass. He has central line placed on left upper extremity. #Central line access Patient has seen Dr. Lucky Cowboy for evaluation of the left upper extremity central line.  I discussed with Dr. Lucky Cowboy and tip is in good position in the SVC.  Okay to use from vascular surgeon's aspect.  [facility] pathology consultation on biopsy specimen from 10/28/2019 showed findings compatible with classic Hodgkin's lymphoma.  # Last AVD chemotherapy was given on 07/10/2020.  He no showed for his chemotherapy appointment in mid December 2021.  He was seen by me on 08/07/2020 at that time he reports acute onset of chest pain, left shoulder, back and left arm. He was sent to emergency room to have a CT scan done to rule out acute pulmonary embolism 08/07/2020, CT chest angiogram showed no pulmonary emboli or acute chest vascular pathology. Superior mediastinal lymphadenopathy consistent with clinical history of lymphoma.  Largest node measures 8.3 x 8.3 x 3.8 cm.  Several other smaller but pathologic lymph nodes in the anterior mediastinum, second largest at the aortopulmonary window measuring 1.3 x 1.9 x 3.1 cm.  No pulmonary mass or nodule.  Patient also had negative troponin, Patient was discharged home for further  follow-up with cancer center.  ER physician provided a prescription of Percocet 5/325 mg 1 tablet every 4 hours as needed for severe pain.  I obtained additional work-up for  work-up to see if there is lymphoma progression.  08/23/2020 PET scan showed dominant right paramidline anterior mediastinal mass lesion is stable in size comparing to her outside PET scan on 03/24/2020.  Lesion showed low-level FDG uptake [place] 3, not appreciably changed in the interval.  Mildly enlarged hepatoduodenal ligament lymph node measures minimally smaller on CT imaging today with [place] 2 uptake.  No additional area of unexpected or suspicious hypermetabolic zone.  No other findings of lymphadenopathy on noncontrast CT image today.  Patient was recommended to proceed with 2D echocardiogram and he had Echo done on 09/08/2020. The delay was due to him not showing up to appointment.  #09/08/2018  2D echocardiogram showed low normal LVEF 50% to 55%.  This is slightly decreased from his outside echocardiogram in June 2021 with LVEF of 60%.    INTERVAL HISTORY Preston [name] is a 35 y.o. male who has above history reviewed by me today presents for follow up visit for management of Hodgkin's  Lymphoma During the interval, patient had evaluation by Dr. Elayne Guerin. MRI cervical spine showed bulky degenerative disc extrusion at C5-C6 with severe spinal stenosis, spinal cord mass-effect and abnormal cord signal (edema and/or myelomalacia). Moderate to severe multifactorial right C6 foraminal stenosis. Smaller disc herniation at C3-C4, and lobulated disc bulging at C4-C5 with up to mild spinal stenosis at those levels. Up to moderate degenerative right C4, left C7, and moderate to severe bilateral C8 neural foraminal stenosis.  Patient underwent  C5-6 anterior cervical discectomy and fusion on 10/20/20  Today patient reports some nausea.  Appetite has decreased.  Still has some neck pain. He has  utilize antiemetics  with some relief. He has 9 pound weight loss since last visit.    Review of Systems  Constitutional: Positive for unexpected weight change. Negative for appetite  change, chills, fatigue and fever.  HENT:   Negative for hearing loss and voice change.   Eyes: Negative for eye problems and icterus.  Respiratory: Negative for chest tightness, cough and shortness of breath.   Cardiovascular: Negative for leg swelling.  Gastrointestinal: Positive for nausea. Negative for abdominal distention and abdominal pain.  Endocrine: Negative for hot flashes.  Genitourinary: Negative for difficulty urinating, dysuria and frequency.   Musculoskeletal: Positive for back pain. Negative for arthralgias and neck pain.       Neck/back pain  Skin: Negative for itching and rash.  Neurological: Negative for light-headedness and numbness.  Hematological: Negative for adenopathy. Does not bruise/bleed easily.  Psychiatric/Behavioral: Negative for confusion.    MEDICAL HISTORY:  Past Medical History:  Diagnosis Date  . Hodgkin's lymphoma ([place])   . Tobacco use     SURGICAL HISTORY: Past Surgical History:  Procedure Laterality Date  . ANTERIOR CERVICAL DECOMP/DISCECTOMY FUSION N/A 10/20/2020   Procedure: ANTERIOR CERVICAL DECOMPRESSION/DISCECTOMY FUSION 1 LEVEL C5-C6;  Surgeon: Meade Maw, MD;  Location: [facility] ORS;  Service: Neurosurgery;  Laterality: N/A;    SOCIAL HISTORY: Social History   Socioeconomic History  . Marital status: Single    Spouse name: Not on file  . Number of children: Not on file  . Years of education: Not on file  . Highest education level: Not on file  Occupational History  . Not on file  Tobacco Use  . Smoking status: Current Every Day Smoker  Packs/day: 0.50    Years: 20.00    Pack years: 10.00  . Smokeless tobacco: Never Used  Vaping Use  . Vaping Use: Never used  Substance and Sexual Activity  . Alcohol use: Yes    Comment: occasional   . Drug use: Not Currently  . Sexual activity: Not on file  Other Topics Concern  . Not on file  Social History Narrative  . Not on file   Social Determinants of Health    Financial Resource Strain: Not on file  Food Insecurity: Not on file  Transportation Needs: Not on file  Physical Activity: Not on file  Stress: Not on file  Social Connections: Not on file  Intimate Partner Violence: Not on file    FAMILY HISTORY: Family History  Problem Relation Age of Onset  . Heart failure Mother   . Pneumonia Father     ALLERGIES:  has No Known Allergies.  MEDICATIONS:  Current Outpatient Medications  Medication Sig Dispense Refill  . acetaminophen (TYLENOL) 500 MG tablet Take 1,000 mg by mouth every 6 (six) hours as needed for mild pain or moderate pain.    Marland Kitchen dexamethasone (DECADRON) 4 MG tablet Take 2 tablets (8 mg total) by mouth daily. 6 tablet 0  . ibuprofen (ADVIL) 200 MG tablet Take 600-800 mg by mouth every 6 (six) hours as needed for mild pain or moderate pain.    Marland Kitchen ondansetron (ZOFRAN) 8 MG tablet Take 1 tablet (8 mg total) by mouth 2 (two) times daily. 60 tablet 1  . prochlorperazine (COMPAZINE) 10 MG tablet Take 1 tablet (10 mg total) by mouth every 6 (six) hours as needed for nausea or vomiting. 60 tablet 1  . methocarbamol (ROBAXIN) 500 MG tablet Take 1 tablet (500 mg total) by mouth every 6 (six) hours as needed for muscle spasms. (Patient not taking: Reported on 11/10/2020) 21 tablet 0  . omeprazole (PRILOSEC) 20 MG capsule Take 1 capsule (20 mg total) by mouth daily. (Patient not taking: Reported on 11/10/2020) 30 capsule 0  . oxyCODONE-acetaminophen (PERCOCET) 5-325 MG tablet Take 1-2 tablets by mouth every 4 (four) hours as needed for moderate pain or severe pain. (Patient not taking: Reported on 11/10/2020) 30 tablet 0   No current facility-administered medications for this visit.   Facility-Administered Medications Ordered in Other Visits  Medication Dose Route Frequency Provider Last Rate Last Admin  . heparin lock flush 100 unit/mL  500 Units Intravenous Once Earlie Server, MD      . sodium chloride flush (NS) 0.9 % injection 10 mL  10 mL  Intravenous PRN Earlie Server, MD   10 mL at 08/07/20 0826     PHYSICAL EXAMINATION: ECOG PERFORMANCE STATUS: 1 - Symptomatic but completely ambulatory Vitals:   11/10/20 0934  BP: 140/90  Pulse: 87  Temp: (!) 96.5 F (35.8 C)  SpO2: 100%   Filed Weights   11/10/20 0934  Weight: 161 lb 12.8 oz (73.4 kg)    Physical Exam Constitutional:      General: He is not in acute distress. HENT:     Head: Normocephalic and atraumatic.  Eyes:     General: No scleral icterus. Cardiovascular:     Rate and Rhythm: Normal rate and regular rhythm.     Heart sounds: Normal heart sounds.  Pulmonary:     Effort: Pulmonary effort is normal. No respiratory distress.     Breath sounds: No wheezing.  Abdominal:     General: Bowel sounds are normal. There  is no distension.     Palpations: Abdomen is soft.  Musculoskeletal:        General: No deformity. Normal range of motion.     Cervical back: Normal range of motion and neck supple.  Skin:    General: Skin is warm and dry.     Findings: No erythema or rash.  Neurological:     Mental Status: He is alert and oriented to person, place, and time. Mental status is at baseline.     Cranial Nerves: No cranial nerve deficit.     Coordination: Coordination normal.  Psychiatric:        Mood and Affect: Mood normal.   left upper arm central line access     LABORATORY DATA:  I have reviewed the data as listed Lab Results  Component Value Date   WBC 5.8 11/10/2020   HGB 14.2 11/10/2020   HCT 38.3 (L) 11/10/2020   MCV 86.8 11/10/2020   PLT 288 11/10/2020   Recent Labs    10/03/20 0857 10/17/20 2325 10/21/20 0538 11/10/20 0917  NA 136 138 133* 136  K 3.9 3.5 3.5 3.8  CL 99 101 98 100  CO2 21* 21* 24 23  GLUCOSE 104* 88 98 95  BUN 10 6 5* 9  CREATININE 0.94 0.67 0.67 0.72  CALCIUM 9.4 9.5 9.8 9.5  GFRNONAA >60 >60 >60 >60  PROT 8.4* 8.6*  --  8.2*  ALBUMIN 4.7 4.9  --  4.6  AST 33 40  --  26  ALT 25 39  --  20  ALKPHOS 55 54  --   64  BILITOT 1.0 0.7  --  1.7*   Iron/TIBC/Ferritin/ %Sat No results found for: IRON, TIBC, FERRITIN, IRONPCTSAT    RADIOGRAPHIC STUDIES: I have personally reviewed the radiological images as listed and agreed with the findings in the report. DG Chest 2 View  Result Date: 10/18/2020 CLINICAL DATA:  Left shoulder pain, generalized weakness, lymphoma EXAM: CHEST - 2 VIEW COMPARISON:  08/07/2020, CT 08/07/2020 FINDINGS: Lungs are clear. No pneumothorax or pleural effusion. Right paratracheal and suprahilar soft tissue thickening is unchanged, better appreciated on prior CT examination and related to the anterior mediastinal mass. Left upper extremity PICC line tip noted within the superior vena cava. Cardiac size within normal limits. Pulmonary vascularity is normal. No acute bone abnormality. IMPRESSION: Unchanged anterior mediastinal mass. No radiographic evidence of acute cardiopulmonary disease. Electronically Signed   By: Fidela Salisbury MD   On: 10/18/2020 00:42   DG Cervical Spine 2 or 3 views  Result Date: 10/21/2020 CLINICAL DATA:  Status post anterior discectomy and fusion EXAM: CERVICAL SPINE - 2-3 VIEW COMPARISON:  Intraoperative cervical radiographs October 20, 2020; cervical MRI October 18, 2020. FINDINGS: Frontal and lateral views were obtained. Patient is status post anterior screw and plate fixation at C5 and C6 with disc spacer at C5-6. Soft tissue air on the left consistent with recent surgery. No fracture or spondylolisthesis. Mild prevertebral soft tissue thickening is likely due to recent surgery. Predental space region appears normal. There is moderate disc space narrowing at C6-7 and C7-T1. There is slight disc space narrowing at C4-5. No erosive change. Lung apices are clear. IMPRESSION: Status post anterior screw and plate fixation at C5 and C6 with disc spacer at C5-6. Support hardware intact. Postoperative changes noted with soft tissue air on the left. Prevertebral soft tissue  thickening is likely due to recent surgery. Areas of osteoarthritic change with disc space narrowing most  severe at C6-7 and C7-T1. No fracture or spondylolisthesis. Electronically Signed   By: Lowella Grip III M.D.   On: 10/21/2020 08:51   DG Cervical Spine 2-3 Views  Result Date: 10/20/2020 CLINICAL DATA:  35 year old male with bulky C5-C6 disc extrusion, spinal stenosis and cord mass effect. EXAM: CERVICAL SPINE - 2-3 VIEW; DG C-ARM 1-60 MIN COMPARISON:  Cervical spine MRI 10/18/2020. FINDINGS: Two intraoperative fluoroscopic spot views of the cervical spine in the AP and lateral projection. New C5-C6 ACDF hardware. Improved cervical lordosis. Intubated. Small sponge marker seen along the anterior neck. IMPRESSION: Intraoperative images of C5-C6 ACDF. Electronically Signed   By: Genevie Ann M.D.   On: 10/20/2020 10:55   CT Head Wo Contrast  Result Date: 10/17/2020 CLINICAL DATA:  Neurologic deficit, lymphoma currently undergoing chemotherapy, bilateral hand and foot weakness EXAM: CT HEAD WITHOUT CONTRAST TECHNIQUE: Contiguous axial images were obtained from the base of the skull through the vertex without intravenous contrast. COMPARISON:  08/23/2020 FINDINGS: Brain: No acute infarct or hemorrhage. Lateral ventricles and midline structures are unremarkable. No acute extra-axial fluid collections. No mass effect. Vascular: No hyperdense vessel or unexpected calcification. Skull: Normal. Negative for fracture or focal lesion. Sinuses/Orbits: No acute finding. Other: None. IMPRESSION: 1. No acute intracranial process. Electronically Signed   By: Randa Ngo M.D.   On: 10/17/2020 23:56   MR Brain W and Wo Contrast  Result Date: 10/18/2020 CLINICAL DATA:  35 year old male with new onset weakness, loss of bilateral hand grip, weakness in both feet. History of stage III lymphoma with most recent chemotherapy 2 weeks ago. EXAM: MRI HEAD WITHOUT AND WITH CONTRAST TECHNIQUE: Multiplanar, multiecho pulse  sequences of the brain and surrounding structures were obtained without and with intravenous contrast. CONTRAST:  26m GADAVIST GADOBUTROL 1 MMOL/ML IV SOLN COMPARISON:  Cervical spine MRI today reported separately. Head CT yesterday. FINDINGS: Brain: Normal cerebral volume. No restricted diffusion to suggest acute infarction. No midline shift, mass effect, evidence of mass lesion, ventriculomegaly, extra-axial collection or acute intracranial hemorrhage. Cervicomedullary junction and pituitary are within normal limits. Axial FLAIR imaging degraded by motion despite repeated imaging attempts. Otherwise gray and white matter signal is within normal limits throughout the brain. No encephalomalacia or chronic cerebral blood products identified. No abnormal enhancement identified.  No dural thickening. Vascular: Major intracranial vascular flow voids are preserved. The major dural venous sinuses are enhancing and appear to be patent. Skull and upper cervical spine: Cervical spine detailed separately today. Normal visible bone marrow signal. Sinuses/Orbits: Negative orbits. Trace paranasal sinus mucosal thickening. Other: Mastoids are well aerated. Grossly normal visible internal auditory structures. Right superior convexity scalp soft tissue swelling and edema (series 20, image 48 and series 26, image 12). Normal underlying calvarium marrow signal. Other scalp and face soft tissues appear normal. IMPRESSION: 1. Normal MRI appearance of the brain. No acute intracranial abnormality. 2. Nonspecific right superior convexity scalp soft tissue swelling and edema. Query recent trauma/fall. 3. See Cervical Spine MRI reported separately. Electronically Signed   By: HGenevie AnnM.D.   On: 10/18/2020 04:26   MR Cervical Spine W or Wo Contrast  Addendum Date: 10/18/2020   ADDENDUM REPORT: 10/18/2020 04:51 ADDENDUM: Study discussed by telephone with Dr. ZHulan Saason 10/18/2020 at 0447 hours. He is already in discussion with  Neurosurgery. Electronically Signed   By: HGenevie AnnM.D.   On: 10/18/2020 04:51   Result Date: 10/18/2020 CLINICAL DATA:  35year old male with new onset weakness, loss of bilateral hand  grip, weakness in both feet. History of stage III lymphoma with most recent chemotherapy 2 weeks ago. EXAM: MRI CERVICAL SPINE WITHOUT AND WITH CONTRAST TECHNIQUE: Multiplanar and multiecho pulse sequences of the cervical spine, to include the craniocervical junction and cervicothoracic junction, were obtained without and with intravenous contrast. CONTRAST:  53mL GADAVIST GADOBUTROL 1 MMOL/ML IV SOLN in conjunction with contrast enhanced imaging of the brain reported separately. COMPARISON:  Brain MRI today reported separately. Thoracic and lumbar MRI 0302 hours today. FINDINGS: Alignment: Straightening and mild reversal of cervical lordosis. No significant spondylolisthesis. Vertebrae: Minimal degenerative endplate marrow edema at C5-C6. Normal background bone marrow signal, with no suspicious osseous lesion. Cord: Abnormal. Bulky disc extrusion at C5-C6 results in mass effect on the spinal cord and abnormal central cord signal (series 7, image 9). The abnormal increased T2 cord signal is eccentric to the left just above the disc (series 8, image 16). See additional details below. No definite cord enhancement. Above the C4-C5 disc and below C6 the visible spinal cord appears within normal limits. No dural thickening. Posterior Fossa, vertebral arteries, paraspinal tissues: Cervicomedullary junction is within normal limits. Brain detailed separately today. Preserved major vascular flow voids in the neck. Negative visible neck soft tissues, right lung apex. Disc levels: C2-C3: Mild facet hypertrophy. Mild bilateral C3 neural foraminal stenosis. C3-C4: Right paracentral disc protrusion (series 8, image 9). Mild spinal stenosis. Subtle cord mass effect. Additional right eccentric disc bulging and endplate spurring with mild facet  hypertrophy results in mild left and mild to moderate right C4 foraminal stenosis. C4-C5: Mildly lobulated right eccentric circumferential disc bulge and endplate spurring. Mild spinal stenosis. No cord mass effect. Mild right C5 foraminal stenosis. C5-C6: Bulky left paracentral disc extrusion (series 5, image 9 and series 8, image 19). Moderate to severe spinal stenosis and cord mass effect with abnormal cord signal as stated above. Additional disc bulging and endplate spurring at this level. Mild left but moderate to severe right C6 foraminal stenosis. C6-C7: Left eccentric circumferential disc bulge and endplate spurring. No significant spinal stenosis. Mild-to-moderate left C7 neural foraminal stenosis. C7-T1: Foraminal, far lateral disc bulge and endplate spurring. Mild ligament flavum hypertrophy. No spinal stenosis. But there is moderate to severe bilateral C8 neural foraminal stenosis. Stable visible upper thoracic levels. IMPRESSION: 1. Bulky degenerative disc extrusion at C5-C6 with severe spinal stenosis, spinal cord mass effect and abnormal cord signal (edema and/or myelomalacia). Moderate to severe multifactorial right C6 foraminal stenosis. 2. Smaller disc herniation at C3-C4, and lobulated disc bulging at C4-C5 with up to mild spinal stenosis at those levels. Up to moderate degenerative right C4, left C7, and moderate to severe bilateral C8 neural foraminal stenosis. 3. No metastatic disease identified in the cervical spine. Electronically Signed: By: Genevie Ann M.D. On: 10/18/2020 04:34   MR THORACIC SPINE W WO CONTRAST  Result Date: 10/18/2020 CLINICAL DATA:  Stage 3 lymphoma.  New onset weakness. EXAM: MRI THORACIC AND LUMBAR SPINE WITHOUT AND WITH CONTRAST TECHNIQUE: Multiplanar and multiecho pulse sequences of the thoracic and lumbar spine were obtained without and with intravenous contrast. CONTRAST:  64mL GADAVIST GADOBUTROL 1 MMOL/ML IV SOLN COMPARISON:  None. FINDINGS: MRI THORACIC SPINE  FINDINGS Alignment:  Physiologic. Vertebrae: No fracture, evidence of discitis, or bone lesion. Cord: Normal signal and morphology. No abnormal contrast enhancement. Paraspinal and other soft tissues: Large amount of anterior mediastinal lymphadenopathy, incompletely visualized. Disc levels: No spinal canal stenosis. MRI LUMBAR SPINE FINDINGS Segmentation:  Standard. Alignment:  Physiologic. Vertebrae:  No fracture, evidence of discitis, or bone lesion. Conus medullaris: Extends to the L1 level and appears normal. No abnormal contrast enhancement. Paraspinal and other soft tissues: Negative. Disc levels: Small left L5-S1 extraforaminal disc protrusion. No spinal canal or neural foraminal stenosis. IMPRESSION: 1. No evidence of metastatic disease to the thoracic or lumbar spine. 2. Partially visualized anterior mediastinal lymphadenopathy, consistent with lymphoma. 3. Small left L5-S1 extraforaminal disc protrusion could irritate the left L5 nerve root. Electronically Signed   By: Ulyses Jarred M.D.   On: 10/18/2020 04:00   MR Lumbar Spine W Wo Contrast  Result Date: 10/18/2020 CLINICAL DATA:  Stage 3 lymphoma.  New onset weakness. EXAM: MRI THORACIC AND LUMBAR SPINE WITHOUT AND WITH CONTRAST TECHNIQUE: Multiplanar and multiecho pulse sequences of the thoracic and lumbar spine were obtained without and with intravenous contrast. CONTRAST:  54m GADAVIST GADOBUTROL 1 MMOL/ML IV SOLN COMPARISON:  None. FINDINGS: MRI THORACIC SPINE FINDINGS Alignment:  Physiologic. Vertebrae: No fracture, evidence of discitis, or bone lesion. Cord: Normal signal and morphology. No abnormal contrast enhancement. Paraspinal and other soft tissues: Large amount of anterior mediastinal lymphadenopathy, incompletely visualized. Disc levels: No spinal canal stenosis. MRI LUMBAR SPINE FINDINGS Segmentation:  Standard. Alignment:  Physiologic. Vertebrae:  No fracture, evidence of discitis, or bone lesion. Conus medullaris: Extends to the L1  level and appears normal. No abnormal contrast enhancement. Paraspinal and other soft tissues: Negative. Disc levels: Small left L5-S1 extraforaminal disc protrusion. No spinal canal or neural foraminal stenosis. IMPRESSION: 1. No evidence of metastatic disease to the thoracic or lumbar spine. 2. Partially visualized anterior mediastinal lymphadenopathy, consistent with lymphoma. 3. Small left L5-S1 extraforaminal disc protrusion could irritate the left L5 nerve root. Electronically Signed   By: KUlyses JarredM.D.   On: 10/18/2020 04:00   DG C-Arm 1-60 Min  Result Date: 10/20/2020 CLINICAL DATA:  35year old male with bulky C5-C6 disc extrusion, spinal stenosis and cord mass effect. EXAM: CERVICAL SPINE - 2-3 VIEW; DG C-ARM 1-60 MIN COMPARISON:  Cervical spine MRI 10/18/2020. FINDINGS: Two intraoperative fluoroscopic spot views of the cervical spine in the AP and lateral projection. New C5-C6 ACDF hardware. Improved cervical lordosis. Intubated. Small sponge marker seen along the anterior neck. IMPRESSION: Intraoperative images of C5-C6 ACDF. Electronically Signed   By: HGenevie AnnM.D.   On: 10/20/2020 10:55      ASSESSMENT & PLAN:  1. Encounter for antineoplastic chemotherapy   2. 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 Non-intractable vomiting with nausea, unspecified vomiting type   4. Weight loss    # Hodgkin's lymphoma, classic type Per previous Oncologist note, S/p 2 cycles of ABVD with favorable PET outcome (deuville 3), and then 1 cycle of AVD.  [facility] 4-day 1 AVD on 06/26/2020, day 15 AVD on 07/10/2020. Cycle 5 day 1 was delayed due to patient no-show to his appointments. PET scan images were independently reviewed by me and discussed with patient. Comparing to his PET scan done in outside facility in August 2021, there is no significant interval changes of the size and hypermetabolic  activity,[[place] 3].  Labs reviewed and discussed with patient.  Hold off treatment due to patient has nausea, weight loss. Recommend dexamethasone 8 mg daily for 3 days for nausea and hopefully boost his appetite.  Patient to follow-up in 1 week for reevaluation . #Left chest/back/shoulder/arm pain due to radiculopathy due to cervical stenosis status post surgical intervention.  Follow-up with neurosurgery.  All questions were answered. The patient knows to call the clinic with any problems questions or concerns.  Return of visit: 1 week for cycle 6-day 15 chemotherapy.  Earlie Server, MD, PhD Hematology Oncology [facility] at [facility] Pager- [phone] 11/10/2020

## 2020-11-17 ENCOUNTER — Inpatient Hospital Stay: Payer: Self-pay

## 2020-11-17 ENCOUNTER — Inpatient Hospital Stay: Payer: Self-pay | Admitting: Oncology

## 2020-11-29 ENCOUNTER — Inpatient Hospital Stay: Payer: Self-pay | Admitting: Oncology

## 2020-11-29 ENCOUNTER — Inpatient Hospital Stay: Payer: Self-pay

## 2020-11-29 ENCOUNTER — Encounter: Payer: Self-pay | Admitting: Oncology

## 2020-12-06 ENCOUNTER — Ambulatory Visit: Payer: Self-pay | Admitting: Internal Medicine

## 2020-12-06 NOTE — Progress Notes (Deleted)
New Outpatient Visit Date: 12/06/2020  Referring Provider: Earlie Server, MD [place],  [place] [address]  Chief Complaint: ***  HPI:  Preston [name] is a 35 y.o. male who is being seen today for the evaluation of decreasing LVEF in the setting of chemotherapy for Hodgkin's lymphoma at the request of Dr. Tasia Catchings. He has a history of Hodgkin's lymphoma and tobacco use.  He has been undergoing chemotherapy.  Echocardiogram in January for surveillance of LVEF was 50-55%, down from reported reading of 60% at outside facility in 01/2020.  --------------------------------------------------------------------------------------------------  Cardiovascular History & Procedures: Cardiovascular Problems:  ***  Risk Factors:  ***  Cath/PCI:  ***  CV Surgery:  ***  EP Procedures and Devices:  ***  Non-Invasive Evaluation(s):  ***  Recent CV Pertinent Labs: Lab Results  Component Value Date   INR 0.9 10/19/2020   BNP 11.5 08/07/2020   K 3.8 11/10/2020   MG 2.3 10/17/2020   BUN 9 11/10/2020   CREATININE 0.72 11/10/2020    --------------------------------------------------------------------------------------------------  Past Medical History:  Diagnosis Date  . Hodgkin's lymphoma ([place])   . Tobacco use     Past Surgical History:  Procedure Laterality Date  . ANTERIOR CERVICAL DECOMP/DISCECTOMY FUSION N/A 10/20/2020   Procedure: ANTERIOR CERVICAL DECOMPRESSION/DISCECTOMY FUSION 1 LEVEL C5-C6;  Surgeon: Meade Maw, MD;  Location: [facility] ORS;  Service: Neurosurgery;  Laterality: N/A;    No outpatient medications have been marked as taking for the 12/06/20 encounter (Appointment) with Tammie Yanda, Harrell Gave, MD.    Allergies: Patient has no known allergies.  Social History   Tobacco Use  . Smoking status: Current Every Day Smoker    Packs/day: 0.50    Years: 20.00    Pack years: 10.00  . Smokeless tobacco: Never Used  Vaping Use  . Vaping Use: Never used   Substance Use Topics  . Alcohol use: Yes    Comment: occasional   . Drug use: Not Currently    Family History  Problem Relation Age of Onset  . Heart failure Mother   . Pneumonia Father     Review of Systems: A 12-system review of systems was performed and was negative except as noted in the HPI.  --------------------------------------------------------------------------------------------------  Physical Exam: There were no vitals taken for this visit.  General:  *** HEENT: No conjunctival pallor or scleral icterus. Facemask in place. Neck: Supple without lymphadenopathy, thyromegaly, JVD, or HJR. No carotid bruit. Lungs: Normal work of breathing. Clear to auscultation bilaterally without wheezes or crackles. Heart: Regular rate and rhythm without murmurs, rubs, or gallops. Non-displaced PMI. Abd: Bowel sounds present. Soft, NT/ND without hepatosplenomegaly Ext: No lower extremity edema. Radial, PT, and DP pulses are 2+ bilaterally Skin: Warm and dry without rash. Neuro: CNIII-XII intact. Strength and fine-touch sensation intact in upper and lower extremities bilaterally. Psych: Normal mood and affect.  EKG:  ***  Lab Results  Component Value Date   WBC 5.8 11/10/2020   HGB 14.2 11/10/2020   HCT 38.3 (L) 11/10/2020   MCV 86.8 11/10/2020   PLT 288 11/10/2020    Lab Results  Component Value Date   NA 136 11/10/2020   K 3.8 11/10/2020   CL 100 11/10/2020   CO2 23 11/10/2020   BUN 9 11/10/2020   CREATININE 0.72 11/10/2020   GLUCOSE 95 11/10/2020   ALT 20 11/10/2020    No results found for: CHOL, HDL, LDLCALC, LDLDIRECT, TRIG, CHOLHDL   --------------------------------------------------------------------------------------------------  ASSESSMENT AND PLAN: ***  Nelva Bush, MD  12/06/2020 7:45 AM

## 2020-12-07 ENCOUNTER — Encounter: Payer: Self-pay | Admitting: Internal Medicine

## 2021-01-01 ENCOUNTER — Other Ambulatory Visit: Payer: Self-pay

## 2021-01-01 ENCOUNTER — Inpatient Hospital Stay: Payer: Medicaid Other

## 2021-01-01 ENCOUNTER — Inpatient Hospital Stay (HOSPITAL_BASED_OUTPATIENT_CLINIC_OR_DEPARTMENT_OTHER): Payer: Self-pay | Admitting: Oncology

## 2021-01-01 ENCOUNTER — Inpatient Hospital Stay: Payer: Medicaid Other | Attending: Oncology

## 2021-01-01 ENCOUNTER — Encounter: Payer: Self-pay | Admitting: Oncology

## 2021-01-01 VITALS — BP 122/85 | HR 77 | Temp 97.4°F | Resp 16 | Wt 167.2 lb

## 2021-01-01 DIAGNOSIS — C8172 Other classical Hodgkin lymphoma, intrathoracic lymph nodes: Secondary | ICD-10-CM

## 2021-01-01 DIAGNOSIS — Z5111 Encounter for antineoplastic chemotherapy: Secondary | ICD-10-CM

## 2021-01-01 DIAGNOSIS — R0602 Shortness of breath: Secondary | ICD-10-CM | POA: Insufficient documentation

## 2021-01-01 DIAGNOSIS — F17210 Nicotine dependence, cigarettes, uncomplicated: Secondary | ICD-10-CM | POA: Insufficient documentation

## 2021-01-01 DIAGNOSIS — M4802 Spinal stenosis, cervical region: Secondary | ICD-10-CM | POA: Insufficient documentation

## 2021-01-01 DIAGNOSIS — M5412 Radiculopathy, cervical region: Secondary | ICD-10-CM | POA: Insufficient documentation

## 2021-01-01 DIAGNOSIS — R110 Nausea: Secondary | ICD-10-CM | POA: Insufficient documentation

## 2021-01-01 DIAGNOSIS — Z79899 Other long term (current) drug therapy: Secondary | ICD-10-CM | POA: Insufficient documentation

## 2021-01-01 LAB — CBC WITH DIFFERENTIAL/PLATELET
Abs Immature Granulocytes: 0.01 10*3/uL (ref 0.00–0.07)
Basophils Absolute: 0 10*3/uL (ref 0.0–0.1)
Basophils Relative: 0 %
Eosinophils Absolute: 0.1 10*3/uL (ref 0.0–0.5)
Eosinophils Relative: 1 %
HCT: 37 % — ABNORMAL LOW (ref 39.0–52.0)
Hemoglobin: 13.6 g/dL (ref 13.0–17.0)
Immature Granulocytes: 0 %
Lymphocytes Relative: 23 %
Lymphs Abs: 1.4 10*3/uL (ref 0.7–4.0)
MCH: 32.5 pg (ref 26.0–34.0)
MCHC: 36.8 g/dL — ABNORMAL HIGH (ref 30.0–36.0)
MCV: 88.5 fL (ref 80.0–100.0)
Monocytes Absolute: 0.6 10*3/uL (ref 0.1–1.0)
Monocytes Relative: 11 %
Neutro Abs: 3.8 10*3/uL (ref 1.7–7.7)
Neutrophils Relative %: 65 %
Platelets: 241 10*3/uL (ref 150–400)
RBC: 4.18 MIL/uL — ABNORMAL LOW (ref 4.22–5.81)
RDW: 12.8 % (ref 11.5–15.5)
WBC: 5.8 10*3/uL (ref 4.0–10.5)
nRBC: 0 % (ref 0.0–0.2)

## 2021-01-01 LAB — COMPREHENSIVE METABOLIC PANEL
ALT: 26 U/L (ref 0–44)
AST: 24 U/L (ref 15–41)
Albumin: 4.2 g/dL (ref 3.5–5.0)
Alkaline Phosphatase: 45 U/L (ref 38–126)
Anion gap: 14 (ref 5–15)
BUN: 11 mg/dL (ref 6–20)
CO2: 23 mmol/L (ref 22–32)
Calcium: 9.2 mg/dL (ref 8.9–10.3)
Chloride: 103 mmol/L (ref 98–111)
Creatinine, Ser: 0.68 mg/dL (ref 0.61–1.24)
GFR, Estimated: >60 mL/min (ref >60)
Glucose, Bld: 101 mg/dL — ABNORMAL HIGH (ref 70–99)
Potassium: 3.8 mmol/L (ref 3.5–5.1)
Sodium: 140 mmol/L (ref 135–145)
Total Bilirubin: 1 mg/dL (ref 0.3–1.2)
Total Protein: 7.5 g/dL (ref 6.5–8.1)

## 2021-01-01 LAB — C-REACTIVE PROTEIN: CRP: 0.5 mg/dL (ref <1.0)

## 2021-01-01 MED ORDER — HEPARIN SOD (PORK) LOCK FLUSH 100 UNIT/ML IV SOLN
500.0000 [IU] | Freq: Once | INTRAVENOUS | Status: AC
  Administered 2021-01-01: 500 [IU] via INTRAVENOUS
  Filled 2021-01-01: qty 5

## 2021-01-01 NOTE — Progress Notes (Signed)
Hematology/Oncology follow up note [facility] [phone] [phone]   Patient Care Team: Patient, No Pcp Per (Inactive) as PCP - General (General Practice)  REFERRING PROVIDER: Earlie Server, MD  CHIEF COMPLAINTS/REASON FOR VISIT:  Follow up for hodgkin's lymphoma.   HISTORY OF PRESENTING ILLNESS:   Preston [name] is a  35 y.o.  male with PMH listed below was seen in consultation at the request of  Earlie Server, MD  for evaluation of Hodgkin's lymphoma.  Patient has a known diagnosis of Hodgkin's lymphoma and wants to transfer his oncology care to our cancer center.  Extensive medical records reviewed was performed  Previous oncology care was [place] Per note  10/28/2019 biopsy of right chest wall mass, fibromuscular soft tissue with atypical infiltrate consistent with Hodgkin lymphoma, classical type. There were scattered single atypical cells with morphology suggestive of Jaclynn Guarneri variant cells. These cells were reactive with CD 45, CD 15, CD 30 and nonreactive for pan keratin, cam 5.7, CD3, CD5, CD10, CD 20, melan-A, Sox 10. Ki-67 stained majority of Reed-Sternberg like cells.  Noticed right upper chest wall lump in January 2021. He was incarcerated for 27 months, released in May 2021.  Unintentional weight loss about 30 pounds, drenching night sweats.  01/17/2020 PET scan showed bulky anterior mediastinal mass 12.7cm. with extensive adenopathy in neck, right axilla, mediastinum, bilateral hilar regions, upper abdominal ligament adenopathy and retroperitoneal adenopathy. Abnormal hypermetabolic activity within a splenic focus. Mild patchy ground glass opacity in the right lower lobe may reflect postobstructive inflammatory change without hypermetabolic activity.   01/17/2020 Echo 1. Normal biventricular systolic function and size. LVEF 60%. Averageglobal longitudinal strain is normal, -20%  2. No significant valvular  dysfunction.  3. Normal right-and left-sided filling pressures. 4. No prior study for comparison.  12/30/2019 LDH 588 01/24/2020 CRP 67.2, ESR 60  Stage IIIB Hodgkin's lymphoma, he was recommended ABVD x 2 cycles, followed by PET with plan of AVD x 4 or escalate to BEACOPP if not favorable results.   01/26/2020 ABVD Day 1,15 x 2 cycles.   03/24/2020 PET scan showed significant interval response to therapy. All PET positive lesions previously resolved 03/30/2020 AVD D1 04/13/2020 AVD D15  He was not able to have chest medi port placed due to chest mass. He has central line placed on left upper extremity. #Central line access Patient has seen Dr. Lucky Cowboy for evaluation of the left upper extremity central line.  I discussed with Dr. Lucky Cowboy and tip is in good position in the SVC.  Okay to use from vascular surgeon's aspect.  [facility] pathology consultation on biopsy specimen from 10/28/2019 showed findings compatible with classic Hodgkin's lymphoma.  # Last AVD chemotherapy was given on 07/10/2020.  He no showed for his chemotherapy appointment in mid December 2021.  He was seen by me on 08/07/2020 at that time he reports acute onset of chest pain, left shoulder, back and left arm. He was sent to emergency room to have a CT scan done to rule out acute pulmonary embolism 08/07/2020, CT chest angiogram showed no pulmonary emboli or acute chest vascular pathology. Superior mediastinal lymphadenopathy consistent with clinical history of lymphoma.  Largest node measures 8.3 x 8.3 x 3.8 cm.  Several other smaller but pathologic lymph nodes in the anterior mediastinum, second largest at the aortopulmonary window measuring 1.3 x 1.9 x 3.1 cm.  No pulmonary mass or nodule.  Patient also had negative troponin, Patient was discharged home for further  follow-up with cancer center.  ER physician provided a prescription of Percocet 5/325 mg 1 tablet every 4 hours as needed for severe pain.  I obtained additional work-up for  work-up to see if there is lymphoma progression.  08/23/2020 PET scan showed dominant right paramidline anterior mediastinal mass lesion is stable in size comparing to her outside PET scan on 03/24/2020.  Lesion showed low-level FDG uptake [place] 3, not appreciably changed in the interval.  Mildly enlarged hepatoduodenal ligament lymph node measures minimally smaller on CT imaging today with [place] 2 uptake.  No additional area of unexpected or suspicious hypermetabolic zone.  No other findings of lymphadenopathy on noncontrast CT image today.  Patient was recommended to proceed with 2D echocardiogram and he had Echo done on 09/08/2020. The delay was due to him not showing up to appointment.  #09/08/2020  2D echocardiogram showed low normal LVEF 50% to 55%.  This is slightly decreased from his outside echocardiogram in June 2021 with LVEF of 60%.  # MRI cervical spine showed bulky degenerative disc extrusion at C5-C6 with severe spinal stenosis, spinal cord mass-effect and abnormal cord signal (edema and/or myelomalacia). Moderate to severe multifactorial right C6 foraminal stenosis. Smaller disc herniation at C3-C4, and lobulated disc bulging at C4-C5 with up to mild spinal stenosis at those levels. Up to moderate degenerative right C4, left C7, and moderate to severe bilateral C8 neural foraminal stenosis. 10/20/20 Patient underwent  C5-6 anterior cervical discectomy and fusion   # Multiple no show for chemotherapy treatment appointment.    INTERVAL HISTORY Preston [name] is a 35 y.o. male who has above history reviewed by me today presents for follow up visit for management of Hodgkin's  Lymphoma  Patient was last seen by me on 11/10/2020 for chemotherapy. At that time, he felt weak and has had weight loss due to his neck surgery. Therefore decision was made to hold chemo and give him 1-2 weeks for recovery.  He then no showed to multiple chemotherapy appointments and presented today for  follow up .  His appetite has improved and has gained weight since last visit.  He has noticed some shortness of breath recently, no low extremity swelling.  Neck pain is better.   Review of Systems  Constitutional: Negative for appetite change, chills, fatigue, fever and unexpected weight change.  HENT:   Negative for hearing loss and voice change.   Eyes: Negative for eye problems and icterus.  Respiratory: Positive for shortness of breath. Negative for chest tightness and cough.   Cardiovascular: Negative for leg swelling.  Gastrointestinal: Negative for abdominal distention, abdominal pain and nausea.  Endocrine: Negative for hot flashes.  Genitourinary: Negative for difficulty urinating, dysuria and frequency.   Musculoskeletal: Positive for neck pain. Negative for arthralgias and back pain.       Neck/back pain  Skin: Negative for itching and rash.  Neurological: Negative for light-headedness and numbness.  Hematological: Negative for adenopathy. Does not bruise/bleed easily.  Psychiatric/Behavioral: Negative for confusion.    MEDICAL HISTORY:  Past Medical History:  Diagnosis Date  . Hodgkin's lymphoma ([place])   . Tobacco use     SURGICAL HISTORY: Past Surgical History:  Procedure Laterality Date  . ANTERIOR CERVICAL DECOMP/DISCECTOMY FUSION N/A 10/20/2020   Procedure: ANTERIOR CERVICAL DECOMPRESSION/DISCECTOMY FUSION 1 LEVEL C5-C6;  Surgeon: Meade Maw, MD;  Location: [facility] ORS;  Service: Neurosurgery;  Laterality: N/A;    SOCIAL HISTORY: Social History   Socioeconomic History  . Marital status: Single  Spouse name: Not on file  . Number of children: Not on file  . Years of education: Not on file  . Highest education level: Not on file  Occupational History  . Not on file  Tobacco Use  . Smoking status: Current Every Day Smoker    Packs/day: 0.50    Years: 20.00    Pack years: 10.00  . Smokeless tobacco: Never Used  Vaping Use  . Vaping Use: Never  used  Substance and Sexual Activity  . Alcohol use: Yes    Comment: occasional   . Drug use: Not Currently  . Sexual activity: Not on file  Other Topics Concern  . Not on file  Social History Narrative  . Not on file   Social Determinants of Health   Financial Resource Strain: Not on file  Food Insecurity: Not on file  Transportation Needs: Not on file  Physical Activity: Not on file  Stress: Not on file  Social Connections: Not on file  Intimate Partner Violence: Not on file    FAMILY HISTORY: Family History  Problem Relation Age of Onset  . Heart failure Mother   . Pneumonia Father     ALLERGIES:  has No Known Allergies.  MEDICATIONS:  Current Outpatient Medications  Medication Sig Dispense Refill  . acetaminophen (TYLENOL) 500 MG tablet Take 1,000 mg by mouth every 6 (six) hours as needed for mild pain or moderate pain.    Marland Kitchen dexamethasone (DECADRON) 4 MG tablet Take 2 tablets (8 mg total) by mouth daily. 6 tablet 0  . ibuprofen (ADVIL) 200 MG tablet Take 600-800 mg by mouth every 6 (six) hours as needed for mild pain or moderate pain.    . methocarbamol (ROBAXIN) 500 MG tablet Take 1 tablet (500 mg total) by mouth every 6 (six) hours as needed for muscle spasms. (Patient not taking: Reported on 11/10/2020) 21 tablet 0  . omeprazole (PRILOSEC) 20 MG capsule Take 1 capsule (20 mg total) by mouth daily. (Patient not taking: Reported on 11/10/2020) 30 capsule 0  . ondansetron (ZOFRAN) 8 MG tablet Take 1 tablet (8 mg total) by mouth 2 (two) times daily. 60 tablet 1  . oxyCODONE-acetaminophen (PERCOCET) 5-325 MG tablet Take 1-2 tablets by mouth every 4 (four) hours as needed for moderate pain or severe pain. (Patient not taking: Reported on 11/10/2020) 30 tablet 0  . prochlorperazine (COMPAZINE) 10 MG tablet Take 1 tablet (10 mg total) by mouth every 6 (six) hours as needed for nausea or vomiting. 60 tablet 1   No current facility-administered medications for this visit.    Facility-Administered Medications Ordered in Other Visits  Medication Dose Route Frequency Provider Last Rate Last Admin  . heparin lock flush 100 unit/mL  500 Units Intravenous Once Earlie Server, MD      . sodium chloride flush (NS) 0.9 % injection 10 mL  10 mL Intravenous PRN Earlie Server, MD   10 mL at 08/07/20 0826     PHYSICAL EXAMINATION: ECOG PERFORMANCE STATUS: 1 - Symptomatic but completely ambulatory Vitals:   01/01/21 0840  BP: 122/85  Pulse: 77  Resp: 16  Temp: (!) 97.4 F (36.3 C)   Filed Weights   01/01/21 0840  Weight: 167 lb 3.2 oz (75.8 kg)    Physical Exam Constitutional:      General: He is not in acute distress. HENT:     Head: Normocephalic and atraumatic.  Eyes:     General: No scleral icterus. Cardiovascular:  Rate and Rhythm: Normal rate and regular rhythm.     Heart sounds: Normal heart sounds.  Pulmonary:     Effort: Pulmonary effort is normal. No respiratory distress.     Breath sounds: No wheezing.  Abdominal:     General: Bowel sounds are normal. There is no distension.     Palpations: Abdomen is soft.  Musculoskeletal:        General: No deformity. Normal range of motion.     Cervical back: Normal range of motion and neck supple.  Skin:    General: Skin is warm and dry.     Findings: No erythema or rash.  Neurological:     Mental Status: He is alert and oriented to person, place, and time. Mental status is at baseline.     Cranial Nerves: No cranial nerve deficit.     Coordination: Coordination normal.  Psychiatric:        Mood and Affect: Mood normal.   left upper arm central line access     LABORATORY DATA:  I have reviewed the data as listed Lab Results  Component Value Date   WBC 5.8 01/01/2021   HGB 13.6 01/01/2021   HCT 37.0 (L) 01/01/2021   MCV 88.5 01/01/2021   PLT 241 01/01/2021   Recent Labs    10/03/20 0857 10/17/20 2325 10/21/20 0538 11/10/20 0917  NA 136 138 133* 136  K 3.9 3.5 3.5 3.8  CL 99 101 98  100  CO2 21* 21* 24 23  GLUCOSE 104* 88 98 95  BUN 10 6 5* 9  CREATININE 0.94 0.67 0.67 0.72  CALCIUM 9.4 9.5 9.8 9.5  GFRNONAA >60 >60 >60 >60  PROT 8.4* 8.6*  --  8.2*  ALBUMIN 4.7 4.9  --  4.6  AST 33 40  --  26  ALT 25 39  --  20  ALKPHOS 55 54  --  64  BILITOT 1.0 0.7  --  1.7*   Iron/TIBC/Ferritin/ %Sat No results found for: IRON, TIBC, FERRITIN, IRONPCTSAT    RADIOGRAPHIC STUDIES: I have personally reviewed the radiological images as listed and agreed with the findings in the report. No results found.    ASSESSMENT & PLAN:  1. Encounter for antineoplastic chemotherapy   2. 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 Non-intractable vomiting with nausea, unspecified vomiting type   4. SOB (shortness of breath)    # Hodgkin's lymphoma, classic type Per previous Oncologist note, S/p 2 cycles of ABVD with favorable PET outcome ([place] 3), and then 1 cycle of AVD.  Patient switched care after relocating to [facility]. Received cycle 4-day 1 AVD on 06/26/2020, day 15 AVD on 07/10/2020. Cycle 5 day 1 was delayed due to patient no-show to his appointments. PET scan images were independently reviewed by me and discussed with patient. Comparing to his PET scan done in outside facility in August 2021, there is no significant interval changes of the size and hypermetabolic activity [[place] 3].  Hold chemotherapy due to SOB.  Last echocardiogram was in January and he has had mild decreased LVEF.  If Echo is stable will proceed her last chemotherapy AVD and plan to repeat PET in 8 week.   #Left chest/back/shoulder/arm pain due to radiculopathy due to cervical stenosis status post surgical intervention. Follow-up with neurosurgery.  All questions were answered. The patient knows to call the clinic with any problems questions or concerns.  Return of visit: after PET  Earlie Server,  MD, PhD Hematology Oncology [facility] at  [facility] Pager- [phone] 01/01/2021

## 2021-01-01 NOTE — Progress Notes (Signed)
Patient reports occasional nausea that is relieved with prescribed medication.  Has good and bad days with his appetite.

## 2021-01-02 ENCOUNTER — Other Ambulatory Visit: Payer: Self-pay

## 2021-01-02 ENCOUNTER — Ambulatory Visit
Admission: RE | Admit: 2021-01-02 | Discharge: 2021-01-02 | Disposition: A | Discharge status: Home / Self Care | Payer: Self-pay | Source: Ambulatory Visit | Attending: Oncology | Admitting: Oncology

## 2021-01-02 DIAGNOSIS — Z0189 Encounter for other specified special examinations: Secondary | ICD-10-CM

## 2021-01-02 DIAGNOSIS — F17210 Nicotine dependence, cigarettes, uncomplicated: Secondary | ICD-10-CM | POA: Insufficient documentation

## 2021-01-02 DIAGNOSIS — C8172 Other classical Hodgkin lymphoma, intrathoracic lymph nodes: Secondary | ICD-10-CM

## 2021-01-02 DIAGNOSIS — R0600 Dyspnea, unspecified: Secondary | ICD-10-CM | POA: Insufficient documentation

## 2021-01-02 LAB — ECHOCARDIOGRAM LIMITED
AR max vel: 2.28 cm2
AV Area VTI: 2.19 cm2
AV Area mean vel: 2.3 cm2
AV Mean grad: 6 mmHg
AV Peak grad: 10.2 mmHg
Ao pk vel: 1.6 m/s
Area-P 1/2: 3.2 cm2
MV VTI: 2.83 cm2
S' Lateral: 3.7 cm

## 2021-01-02 NOTE — Progress Notes (Signed)
*  PRELIMINARY RESULTS* Echocardiogram 2D Echocardiogram has been performed.  Preston [name] 01/02/2021, 9:55 AM

## 2021-01-04 ENCOUNTER — Inpatient Hospital Stay: Payer: Medicaid Other

## 2021-01-04 VITALS — BP 130/87 | HR 70 | Temp 97.5°F | Resp 20 | Wt 167.0 lb

## 2021-01-04 DIAGNOSIS — C8170 Other classical Hodgkin lymphoma, unspecified site: Secondary | ICD-10-CM

## 2021-01-04 MED ORDER — FOSAPREPITANT DIMEGLUMINE INJECTION 150 MG
150.0000 mg | Freq: Once | INTRAVENOUS | Status: AC
  Administered 2021-01-04: 150 mg via INTRAVENOUS
  Filled 2021-01-04: qty 150

## 2021-01-04 MED ORDER — PALONOSETRON HCL INJECTION 0.25 MG/5ML
0.2500 mg | Freq: Once | INTRAVENOUS | Status: AC
Start: 2021-01-04 — End: 2021-01-04
  Administered 2021-01-04: 0.25 mg via INTRAVENOUS
  Filled 2021-01-04: qty 5

## 2021-01-04 MED ORDER — HEPARIN SOD (PORK) LOCK FLUSH 100 UNIT/ML IV SOLN
INTRAVENOUS | Status: AC
  Filled 2021-01-04: qty 5

## 2021-01-04 MED ORDER — SODIUM CHLORIDE 0.9 % IV SOLN
375.0000 mg/m2 | Freq: Once | INTRAVENOUS | Status: AC
  Administered 2021-01-04: 700 mg via INTRAVENOUS
  Filled 2021-01-04: qty 70

## 2021-01-04 MED ORDER — SODIUM CHLORIDE 0.9 % IV SOLN
Freq: Once | INTRAVENOUS | Status: AC
  Filled 2021-01-04: qty 250

## 2021-01-04 MED ORDER — SODIUM CHLORIDE 0.9% FLUSH
10.0000 mL | INTRAVENOUS | Status: DC | PRN
  Administered 2021-01-04: 10 mL
  Filled 2021-01-04: qty 10

## 2021-01-04 MED ORDER — DOXORUBICIN HCL CHEMO IV INJECTION 2 MG/ML
25.0000 mg/m2 | Freq: Once | INTRAVENOUS | Status: AC
  Administered 2021-01-04: 46 mg via INTRAVENOUS
  Filled 2021-01-04: qty 23

## 2021-01-04 MED ORDER — SODIUM CHLORIDE 0.9 % IV SOLN
6.0000 mg/m2 | Freq: Once | INTRAVENOUS | Status: AC
  Administered 2021-01-04: 11.2 mg via INTRAVENOUS
  Filled 2021-01-04: qty 11.2

## 2021-01-04 MED ORDER — SODIUM CHLORIDE 0.9 % IV SOLN
10.0000 mg | Freq: Once | INTRAVENOUS | Status: AC
  Administered 2021-01-04: 10 mg via INTRAVENOUS
  Filled 2021-01-04: qty 10

## 2021-01-04 MED ORDER — HEPARIN SOD (PORK) LOCK FLUSH 100 UNIT/ML IV SOLN
500.0000 [IU] | Freq: Once | INTRAVENOUS | Status: AC | PRN
  Administered 2021-01-04: 500 [IU]
  Filled 2021-01-04: qty 5

## 2021-01-04 NOTE — Patient Instructions (Signed)
[facility]  Discharge Instructions: Thank you for choosing [facility] to provide your oncology and hematology care.  If you have a lab appointment with the [facility], please go directly to the [facility] and check in at the registration area.  Wear comfortable clothing and clothing appropriate for easy access to any Portacath or PICC line.   We strive to give you quality time with your provider. You may need to reschedule your appointment if you arrive late (15 or more minutes).  Arriving late affects you and other patients whose appointments are after yours.  Also, if you miss three or more appointments without notifying the office, you may be dismissed from the clinic at the provider's discretion.      For prescription refill requests, have your pharmacy contact our office and allow 72 hours for refills to be completed.    Today you received the following chemotherapy and/or immunotherapy agents Adriamycin, Vinblastine, DTIC      To help prevent nausea and vomiting after your treatment, we encourage you to take your nausea medication as directed.  BELOW ARE SYMPTOMS THAT SHOULD BE REPORTED IMMEDIATELY: . *FEVER GREATER THAN 100.4 F (38 C) OR HIGHER . *CHILLS OR SWEATING . *NAUSEA AND VOMITING THAT IS NOT CONTROLLED WITH YOUR NAUSEA MEDICATION . *UNUSUAL SHORTNESS OF BREATH . *UNUSUAL BRUISING OR BLEEDING . *URINARY PROBLEMS (pain or burning when urinating, or frequent urination) . *BOWEL PROBLEMS (unusual diarrhea, constipation, pain near the anus) . TENDERNESS IN MOUTH AND THROAT WITH OR WITHOUT PRESENCE OF ULCERS (sore throat, sores in mouth, or a toothache) . UNUSUAL RASH, SWELLING OR PAIN  . UNUSUAL VAGINAL DISCHARGE OR ITCHING   Items with * indicate a potential emergency and should be followed up as soon as possible or go to the Emergency Department if any problems should occur.  Please show the CHEMOTHERAPY ALERT CARD  or IMMUNOTHERAPY ALERT CARD at check-in to the Emergency Department and triage nurse.  Should you have questions after your visit or need to cancel or reschedule your appointment, please contact [facility]  [phone] and follow the prompts.  Office hours are 8:00 a.m. to 4:30 p.m. Monday - Friday. Please note that voicemails left after 4:00 p.m. may not be returned until the following business day.  We are closed weekends and major holidays. You have access to a nurse at all times for urgent questions. Please call the main number to the clinic [phone] and follow the prompts.  For any non-urgent questions, you may also contact your provider using MyChart. We now offer e-Visits for anyone 57 and older to request care online for non-urgent symptoms. For details visit [email or web address].   Also download the MyChart app! Go to the app store, search "MyChart", open the app, select [facility], and log in with your MyChart username and password.  Due to Covid, a mask is required upon entering the hospital/clinic. If you do not have a mask, one will be given to you upon arrival. For doctor visits, patients may have 1 support person aged 58 or older with them. For treatment visits, patients cannot have anyone with them due to current Covid guidelines and our immunocompromised population. Vinblastine injection What is this medicine? VINBLASTINE (vin BLAS teen) is a chemotherapy drug. It slows the growth of cancer cells. This medicine is used to treat many types of cancer like breast cancer, testicular cancer, Hodgkin's disease, non-Hodgkin's lymphoma, and sarcoma. This medicine may  be used for other purposes; ask your health care provider or pharmacist if you have questions. COMMON BRAND NAME(S): Velban What should I tell my health care provider before I take this medicine? They need to know if you have any of these conditions:  blood disorders  dental  disease  gout  infection (especially a virus infection such as chickenpox, cold sores, or herpes)  liver disease  lung disease  nervous system disease  recent or ongoing radiation therapy  an unusual or allergic reaction to vinblastine, other chemotherapy agents, other medicines, foods, dyes, or preservatives  pregnant or trying to get pregnant  breast-feeding How should I use this medicine? This drug is given as an infusion into a vein. It is administered in a hospital or clinic by a specially trained health care professional. If you have pain, swelling, burning or any unusual feeling around the site of your injection, tell your health care professional right away. Talk to your pediatrician regarding the use of this medicine in children. While this drug may be prescribed for selected conditions, precautions do apply. Overdosage: If you think you have taken too much of this medicine contact a poison control center or emergency room at once. NOTE: This medicine is only for you. Do not share this medicine with others. What if I miss a dose? It is important not to miss your dose. Call your doctor or health care professional if you are unable to keep an appointment. What may interact with this medicine?  erythromycin  certain medicines for fungal infections like itraconazole, ketoconazole, posaconazole, voriconazole  certain medicines for seizures like phenytoin This list may not describe all possible interactions. Give your health care provider a list of all the medicines, herbs, non-prescription drugs, or dietary supplements you use. Also tell them if you smoke, drink alcohol, or use illegal drugs. Some items may interact with your medicine. What should I watch for while using this medicine? Your condition will be monitored carefully while you are receiving this medicine. You will need important blood work done while you are taking this medicine. This drug may make you feel  generally unwell. This is not uncommon, as chemotherapy can affect healthy cells as well as cancer cells. Report any side effects. Continue your course of treatment even though you feel ill unless your doctor tells you to stop. In some cases, you may be given additional medicines to help with side effects. Follow all directions for their use. Call your doctor or health care professional for advice if you get a fever, chills or sore throat, or other symptoms of a cold or flu. Do not treat yourself. This drug decreases your body's ability to fight infections. Try to avoid being around people who are sick. This medicine may increase your risk to bruise or bleed. Call your doctor or health care professional if you notice any unusual bleeding. Be careful brushing and flossing your teeth or using a toothpick because you may get an infection or bleed more easily. If you have any dental work done, tell your dentist you are receiving this medicine. Avoid taking products that contain aspirin, acetaminophen, ibuprofen, naproxen, or ketoprofen unless instructed by your doctor. These medicines may hide a fever. Do not become pregnant while taking this medicine. Women should inform their doctor if they wish to become pregnant or think they might be pregnant. There is a potential for serious side effects to an unborn child. Talk to your health care professional or pharmacist for more information. Do  not breast-feed an infant while taking this medicine. Men may have a lower sperm count while taking this medicine. Talk to your doctor if you plan to father a child. What side effects may I notice from receiving this medicine? Side effects that you should report to your doctor or health care professional as soon as possible:  allergic reactions like skin rash, itching or hives, swelling of the face, lips, or tongue  low blood counts - This drug may decrease the number of white blood cells, red blood cells and platelets.  You may be at increased risk for infections and bleeding.  signs of infection - fever or chills, cough, sore throat, pain or difficulty passing urine  signs of decreased platelets or bleeding - bruising, pinpoint red spots on the skin, black, tarry stools, nosebleeds  signs of decreased red blood cells - unusually weak or tired, fainting spells, lightheadedness  breathing problems  changes in hearing  change in the amount of urine  chest pain  high blood pressure  mouth sores  nausea and vomiting  pain, swelling, redness or irritation at the injection site  pain, tingling, numbness in the hands or feet  problems with balance, dizziness  seizures Side effects that usually do not require medical attention (report to your doctor or health care professional if they continue or are bothersome):  constipation  hair loss  jaw pain  loss of appetite  sensitivity to light  stomach pain  tumor pain This list may not describe all possible side effects. Call your doctor for medical advice about side effects. You may report side effects to FDA at 1-800-FDA-1088. Where should I keep my medicine? This drug is given in a hospital or clinic and will not be stored at home. NOTE: This sheet is a summary. It may not cover all possible information. If you have questions about this medicine, talk to your doctor, pharmacist, or health care provider.  2021 Elsevier/Gold Standard (2019-06-29 16:42:23) Dacarbazine, DTIC injection What is this medicine? DACARBAZINE (da KAR ba zeen) is a chemotherapy drug. This medicine is used to treat skin cancer. It is also used with other medicines to treat Hodgkin's disease. This medicine may be used for other purposes; ask your health care provider or pharmacist if you have questions. COMMON BRAND NAME(S): DTIC-Dome What should I tell my health care provider before I take this medicine? They need to know if you have any of these  conditions:  infection (especially virus infection such as chickenpox, cold sores, or herpes)  kidney disease  liver disease  low blood counts like low platelets, red blood cells, white blood cells  recent radiation therapy  an unusual or allergic reaction to dacarbazine, other chemotherapy agents, other medicines, foods, dyes, or preservatives  pregnant or trying to get pregnant  breast-feeding How should I use this medicine? This drug is given as an injection or infusion into a vein. It is administered in a hospital or clinic by a specially trained health care professional. Talk to your pediatrician regarding the use of this medicine in children. While this drug may be prescribed for selected conditions, precautions do apply. Overdosage: If you think you have taken too much of this medicine contact a poison control center or emergency room at once. NOTE: This medicine is only for you. Do not share this medicine with others. What if I miss a dose? It is important not to miss your dose. Call your doctor or health care professional if you are unable to  keep an appointment. What may interact with this medicine?  medicines to increase blood counts like filgrastim, pegfilgrastim, sargramostim  vaccines This list may not describe all possible interactions. Give your health care provider a list of all the medicines, herbs, non-prescription drugs, or dietary supplements you use. Also tell them if you smoke, drink alcohol, or use illegal drugs. Some items may interact with your medicine. What should I watch for while using this medicine? Your condition will be monitored carefully while you are receiving this medicine. You will need important blood work done while you are taking this medicine. This drug may make you feel generally unwell. This is not uncommon, as chemotherapy can affect healthy cells as well as cancer cells. Report any side effects. Continue your course of treatment even  though you feel ill unless your doctor tells you to stop. Call your doctor or health care professional for advice if you get a fever, chills or sore throat, or other symptoms of a cold or flu. Do not treat yourself. This drug decreases your body's ability to fight infections. Try to avoid being around people who are sick. This medicine may increase your risk to bruise or bleed. Call your doctor or health care professional if you notice any unusual bleeding. Talk to your doctor about your risk of cancer. You may be more at risk for certain types of cancers if you take this medicine. Do not become pregnant while taking this medicine. Women should inform their doctor if they wish to become pregnant or think they might be pregnant. There is a potential for serious side effects to an unborn child. Talk to your health care professional or pharmacist for more information. Do not breast-feed an infant while taking this medicine. What side effects may I notice from receiving this medicine? Side effects that you should report to your doctor or health care professional as soon as possible:  allergic reactions like skin rash, itching or hives, swelling of the face, lips, or tongue  low blood counts - this medicine may decrease the number of white blood cells, red blood cells and platelets. You may be at increased risk for infections and bleeding.  signs of infection - fever or chills, cough, sore throat, pain or difficulty passing urine  signs of decreased platelets or bleeding - bruising, pinpoint red spots on the skin, black, tarry stools, blood in the urine  signs of decreased red blood cells - unusually weak or tired, fainting spells, lightheadedness  breathing problems  muscle pains  pain at site where injected  trouble passing urine or change in the amount of urine  vomiting  yellowing of the eyes or skin Side effects that usually do not require medical attention (report to your doctor or  health care professional if they continue or are bothersome):  diarrhea  hair loss  loss of appetite  nausea  skin more sensitive to sun or ultraviolet light  stomach upset This list may not describe all possible side effects. Call your doctor for medical advice about side effects. You may report side effects to FDA at 1-800-FDA-1088. Where should I keep my medicine? This drug is given in a hospital or clinic and will not be stored at home. NOTE: This sheet is a summary. It may not cover all possible information. If you have questions about this medicine, talk to your doctor, pharmacist, or health care provider.  2021 Elsevier/Gold Standard (2015-09-29 15:17:39) Doxorubicin injection What is this medicine? DOXORUBICIN (dox oh ROO bi sin)  is a chemotherapy drug. It is used to treat many kinds of cancer like leukemia, lymphoma, neuroblastoma, sarcoma, and Wilms' tumor. It is also used to treat bladder cancer, breast cancer, lung cancer, ovarian cancer, stomach cancer, and thyroid cancer. This medicine may be used for other purposes; ask your health care provider or pharmacist if you have questions. COMMON BRAND NAME(S): Adriamycin, Adriamycin PFS, Adriamycin RDF, Rubex What should I tell my health care provider before I take this medicine? They need to know if you have any of these conditions:  heart disease  history of low blood counts caused by a medicine  liver disease  recent or ongoing radiation therapy  an unusual or allergic reaction to doxorubicin, other chemotherapy agents, other medicines, foods, dyes, or preservatives  pregnant or trying to get pregnant  breast-feeding How should I use this medicine? This drug is given as an infusion into a vein. It is administered in a hospital or clinic by a specially trained health care professional. If you have pain, swelling, burning or any unusual feeling around the site of your injection, tell your health care professional  right away. Talk to your pediatrician regarding the use of this medicine in children. Special care may be needed. Overdosage: If you think you have taken too much of this medicine contact a poison control center or emergency room at once. NOTE: This medicine is only for you. Do not share this medicine with others. What if I miss a dose? It is important not to miss your dose. Call your doctor or health care professional if you are unable to keep an appointment. What may interact with this medicine? This medicine may interact with the following medications:  6-mercaptopurine  paclitaxel  phenytoin  St. John's Wort  trastuzumab  verapamil This list may not describe all possible interactions. Give your health care provider a list of all the medicines, herbs, non-prescription drugs, or dietary supplements you use. Also tell them if you smoke, drink alcohol, or use illegal drugs. Some items may interact with your medicine. What should I watch for while using this medicine? This drug may make you feel generally unwell. This is not uncommon, as chemotherapy can affect healthy cells as well as cancer cells. Report any side effects. Continue your course of treatment even though you feel ill unless your doctor tells you to stop. There is a maximum amount of this medicine you should receive throughout your life. The amount depends on the medical condition being treated and your overall health. Your doctor will watch how much of this medicine you receive in your lifetime. Tell your doctor if you have taken this medicine before. You may need blood work done while you are taking this medicine. Your urine may turn red for a few days after your dose. This is not blood. If your urine is dark or brown, call your doctor. In some cases, you may be given additional medicines to help with side effects. Follow all directions for their use. Call your doctor or health care professional for advice if you get a  fever, chills or sore throat, or other symptoms of a cold or flu. Do not treat yourself. This drug decreases your body's ability to fight infections. Try to avoid being around people who are sick. This medicine may increase your risk to bruise or bleed. Call your doctor or health care professional if you notice any unusual bleeding. Talk to your doctor about your risk of cancer. You may be more at risk  for certain types of cancers if you take this medicine. Do not become pregnant while taking this medicine or for 6 months after stopping it. Women should inform their doctor if they wish to become pregnant or think they might be pregnant. Men should not father a child while taking this medicine and for 6 months after stopping it. There is a potential for serious side effects to an unborn child. Talk to your health care professional or pharmacist for more information. Do not breast-feed an infant while taking this medicine. This medicine has caused ovarian failure in some women and reduced sperm counts in some men This medicine may interfere with the ability to have a child. Talk with your doctor or health care professional if you are concerned about your fertility. This medicine may cause a decrease in Co-Enzyme Q-10. You should make sure that you get enough Co-Enzyme Q-10 while you are taking this medicine. Discuss the foods you eat and the vitamins you take with your health care professional. What side effects may I notice from receiving this medicine? Side effects that you should report to your doctor or health care professional as soon as possible:  allergic reactions like skin rash, itching or hives, swelling of the face, lips, or tongue  breathing problems  chest pain  fast or irregular heartbeat  low blood counts - this medicine may decrease the number of white blood cells, red blood cells and platelets. You may be at increased risk for infections and bleeding.  pain, redness, or irritation  at site where injected  signs of infection - fever or chills, cough, sore throat, pain or difficulty passing urine  signs of decreased platelets or bleeding - bruising, pinpoint red spots on the skin, black, tarry stools, blood in the urine  swelling of the ankles, feet, hands  tiredness  weakness Side effects that usually do not require medical attention (report to your doctor or health care professional if they continue or are bothersome):  diarrhea  hair loss  mouth sores  nail discoloration or damage  nausea  red colored urine  vomiting This list may not describe all possible side effects. Call your doctor for medical advice about side effects. You may report side effects to FDA at 1-800-FDA-1088. Where should I keep my medicine? This drug is given in a hospital or clinic and will not be stored at home. NOTE: This sheet is a summary. It may not cover all possible information. If you have questions about this medicine, talk to your doctor, pharmacist, or health care provider.  2021 Elsevier/Gold Standard (2017-03-12 11:01:26)

## 2021-01-17 ENCOUNTER — Telehealth: Payer: Self-pay | Admitting: Oncology

## 2021-01-17 NOTE — Telephone Encounter (Signed)
Spoke to patient's significant other and explained MD plan from last visit for PET scan approx 8 weeks after last treatment with lab/MD few days later.  Appt details of PET scan and MD provided and she voiced understanding of the plan.

## 2021-01-17 NOTE — Telephone Encounter (Signed)
Pt called to check on his chemo treatment appts. I checked the last LOS and it was scheduled as listed. He stated he is suppose to get chemo every 2 weeks. Could some please contact him to discuss.

## 2021-02-26 ENCOUNTER — Other Ambulatory Visit: Payer: Self-pay

## 2021-02-26 ENCOUNTER — Ambulatory Visit
Admission: RE | Admit: 2021-02-26 | Discharge: 2021-02-26 | Disposition: A | Discharge status: Home / Self Care | Payer: Self-pay | Source: Ambulatory Visit | Attending: Oncology | Admitting: Oncology

## 2021-02-26 DIAGNOSIS — C8172 Other classical Hodgkin lymphoma, intrathoracic lymph nodes: Secondary | ICD-10-CM | POA: Insufficient documentation

## 2021-02-26 DIAGNOSIS — R590 Localized enlarged lymph nodes: Secondary | ICD-10-CM | POA: Insufficient documentation

## 2021-02-26 LAB — GLUCOSE, CAPILLARY: Glucose-Capillary: 82 mg/dL (ref 70–99)

## 2021-02-26 IMAGING — CT NM PET TUM IMG RESTAG (PS) SKULL BASE T - THIGH
9 of 10 series · 17 of 25 positions shown · non-contrast
Comparison: PET-CT [DATE].

CLINICAL DATA: Subsequent treatment strategy for Hodgkin's
lymphoma.

EXAM:
NUCLEAR MEDICINE PET SKULL BASE TO THIGH
TECHNIQUE: 8.97 mCi F-18 FDG was injected intravenously. Full-ring PET imaging
was performed from the skull base to thigh after the radiotracer. CT
data was obtained and used for attenuation correction and anatomic
localization.
Fasting blood glucose: 82 mg/dl

[Series 3: ct wb 5.0 b30f · axial · 5.0mm · 0.98mm/px · z∈[-330,+654]mm · 2 of 329 slices shown]
[im 1/329]
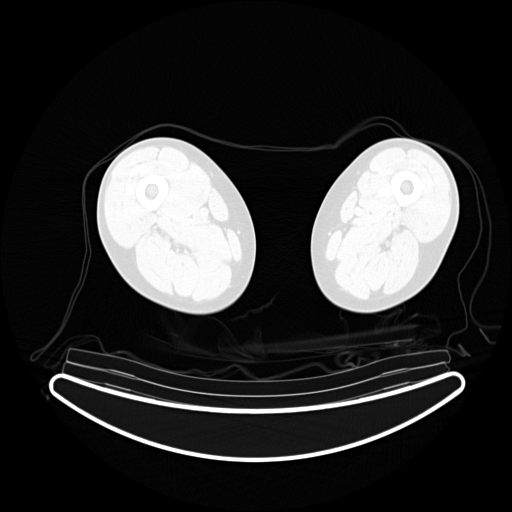
[im 329/329  brain]
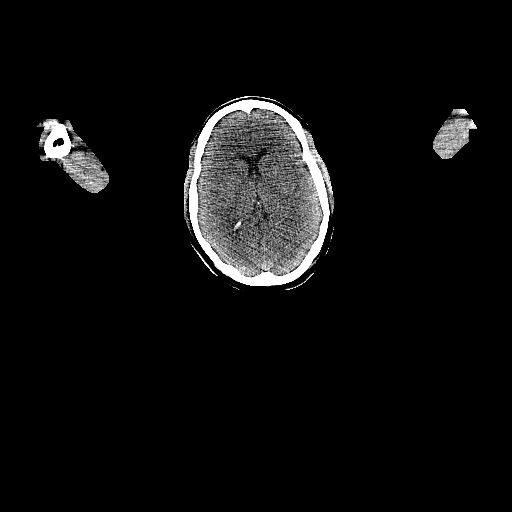

[Series 5: pet wb uncorrected (nac) · axial · 5.0mm · 4.07mm/px · z∈[-330,+654]mm · 2 of 329 slices shown]
[im 1/329]
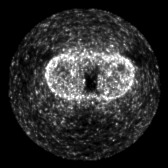
[im 329/329]
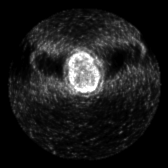

[Series 6: pet wb (ac) · axial · 5.0mm · 3.13mm/px · z∈[-330,+654]mm · 3 of 329 slices shown]
[im 1/329]
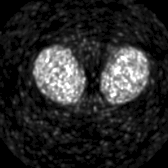
[im 219/329]
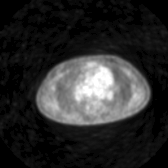
[im 329/329]
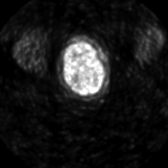

[Series 603: pet_ct axial fused · 3 of 327 slices shown]
[im 109/327]
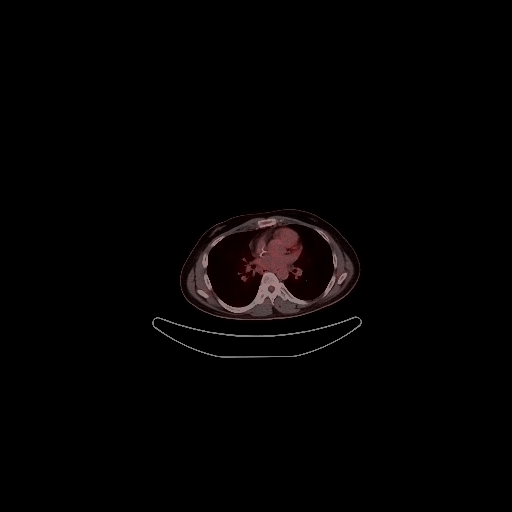
[im 218/327]
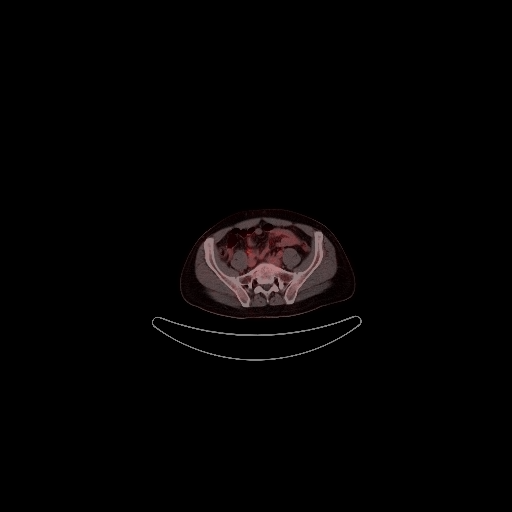
[im 327/327]
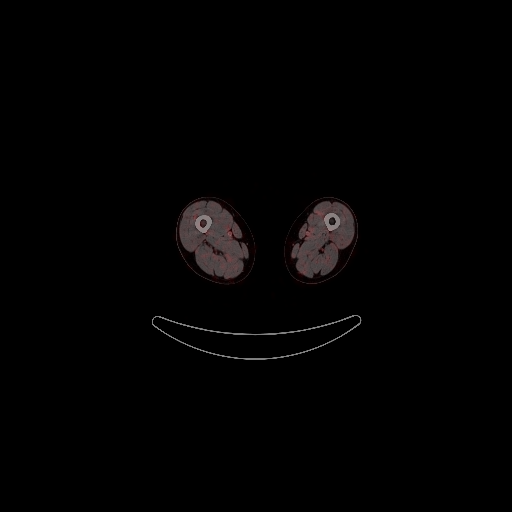

[Series 605: pet_ct sagittal fused · 2 of 162 slices shown]
[im 1/162]
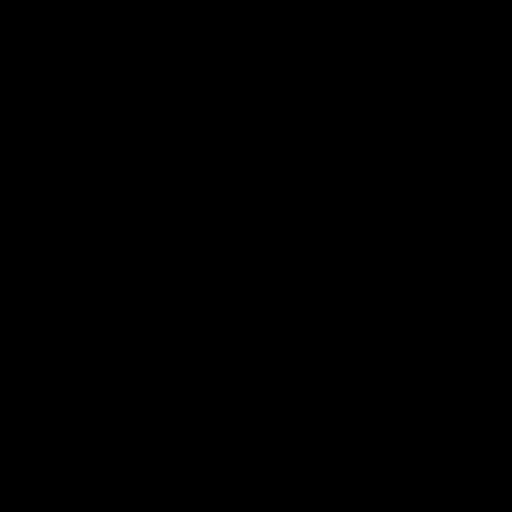
[im 162/162]
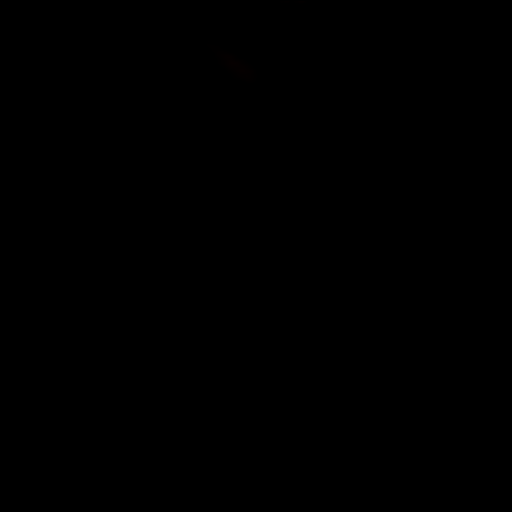

[Series 606: pet axial · 2 of 326 slices shown]
[im 109/326]
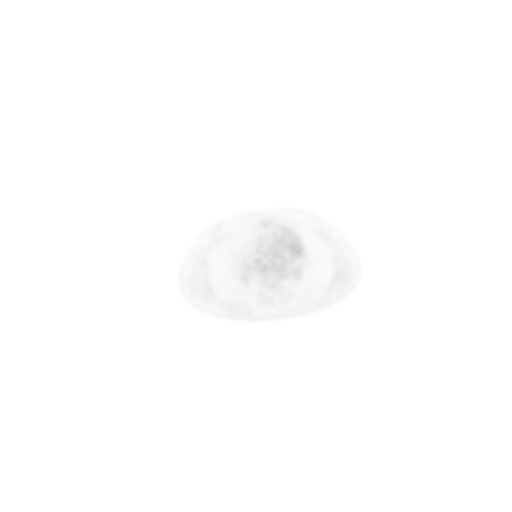
[im 217/326]
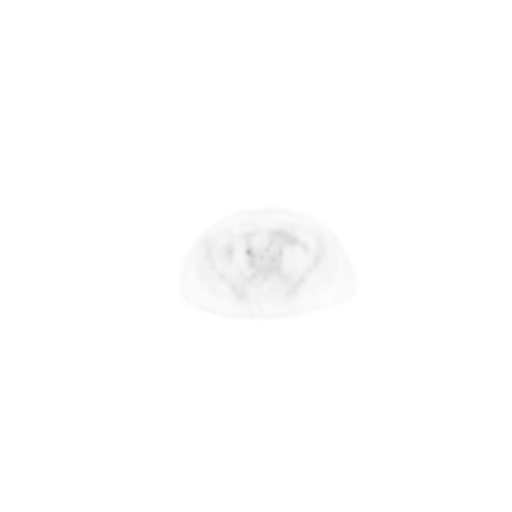

[Series 607: pet coronal · 1 of 114 slices shown]
[im 1/114]
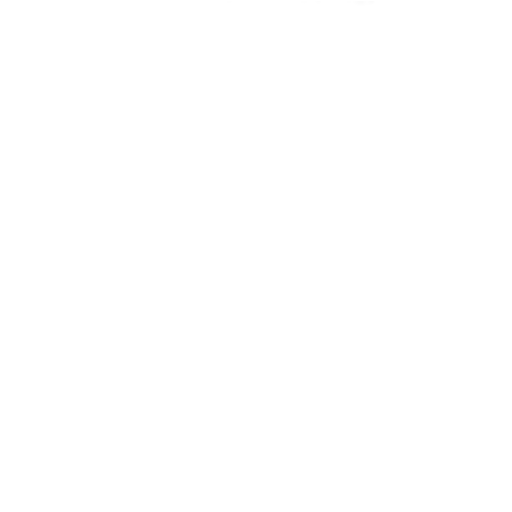

[Series 608: pet sagittal · 1 of 163 slices shown]
[im 1/163]
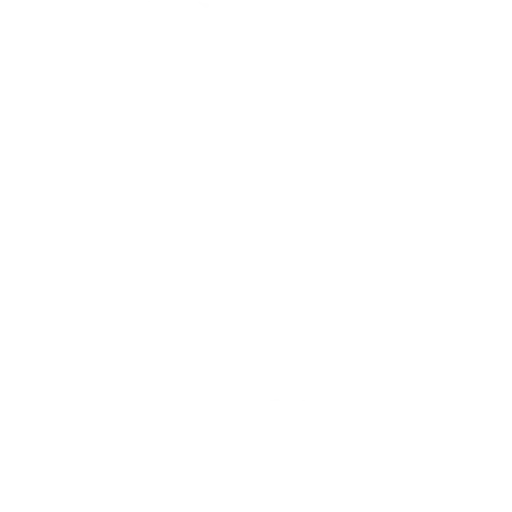

[Series 8112: results mm oncology reading · 5.0mm · 0.51mm/px · 1 of 3 slices shown]
[im 1/3]
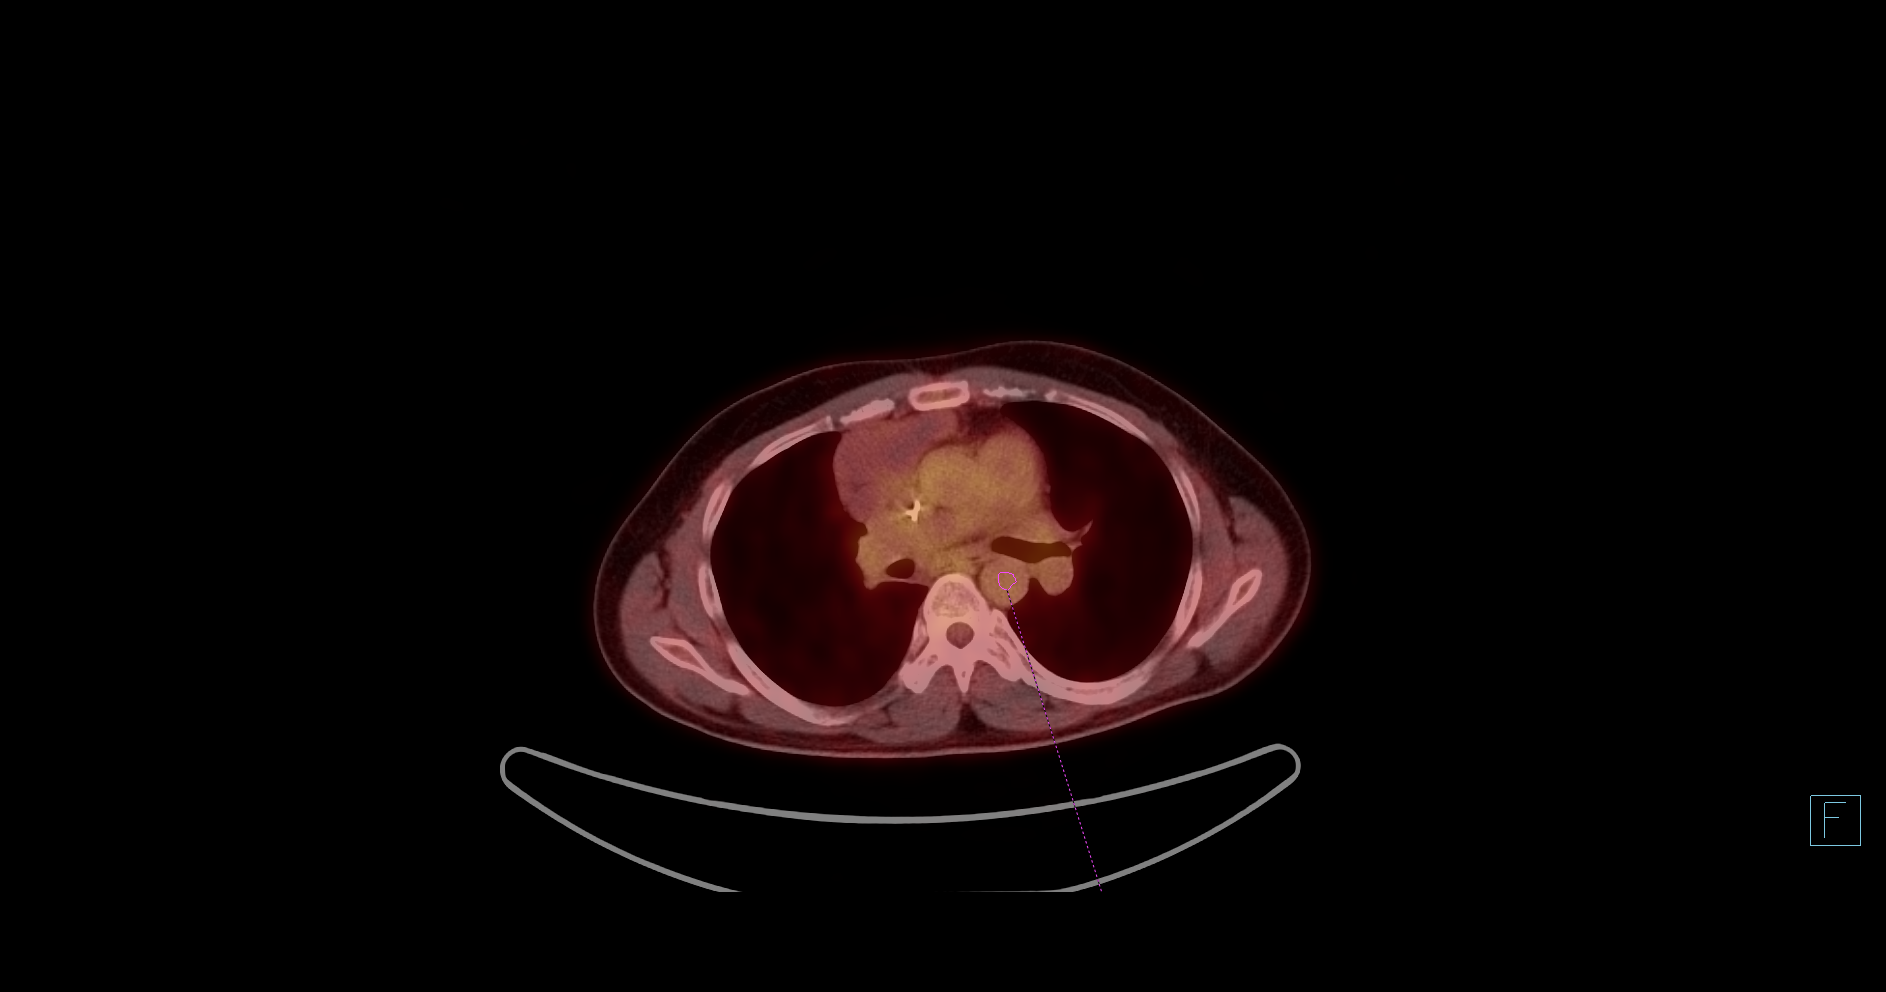

[17 of 25 positions shown; findings below may reference images not displayed]

FINDINGS: Mediastinal blood pool activity: SUV max

Liver activity: SUV max

NECK: No hypermetabolic lymph nodes in the neck. On the prior study
there was a small right-sided level III lymph node with SUV max of
2.0. I do not see this on today's study.

Incidental CT findings: none

CHEST: Persistent anterior mediastinal lymphadenopathy. This
measures a maximum 7.1 x 4.0 cm on image 102/3. This previously
measured a maximum of 8.5 x 4.4 cm. No hypermetabolism is
demonstrated. SUV max is 1.63 less than that of mediastinal
background activity ([HOSPITAL] 2).

No axillary adenopathy.  No pulmonary lesions on the CT scan.

Incidental CT findings: The left-sided PICC line is stable.

ABDOMEN/PELVIS: No abnormal hypermetabolic activity within the
liver, pancreas, adrenal glands, or spleen. No hypermetabolic lymph
nodes in the abdomen or pelvis.

Incidental CT findings: Diffuse fatty infiltration of the liver.
Early age advanced atherosclerotic calcifications involving the
distal aorta and iliac arteries. No inguinal adenopathy.

SKELETON: No focal hypermetabolic activity to suggest skeletal
metastasis.

Incidental CT findings: none
IMPRESSION: 1. Persistent anterior mediastinal lymphadenopathy but no
hypermetabolism ([HOSPITAL] 2).
2. No new or progressive findings.

## 2021-02-26 MED ORDER — FLUDEOXYGLUCOSE F - 18 (FDG) INJECTION
8.7000 | Freq: Once | INTRAVENOUS | Status: AC | PRN
  Administered 2021-02-26: 8.97 via INTRAVENOUS

## 2021-02-28 ENCOUNTER — Other Ambulatory Visit: Payer: Medicaid Other

## 2021-02-28 ENCOUNTER — Inpatient Hospital Stay: Payer: Self-pay | Attending: Oncology | Admitting: Oncology

## 2021-02-28 ENCOUNTER — Inpatient Hospital Stay: Payer: Self-pay

## 2021-02-28 DIAGNOSIS — M5011 Cervical disc disorder with radiculopathy,  high cervical region: Secondary | ICD-10-CM | POA: Insufficient documentation

## 2021-02-28 DIAGNOSIS — Z8571 Personal history of Hodgkin lymphoma: Secondary | ICD-10-CM | POA: Insufficient documentation

## 2021-02-28 DIAGNOSIS — F17210 Nicotine dependence, cigarettes, uncomplicated: Secondary | ICD-10-CM | POA: Insufficient documentation

## 2021-02-28 DIAGNOSIS — M4802 Spinal stenosis, cervical region: Secondary | ICD-10-CM | POA: Insufficient documentation

## 2021-03-09 ENCOUNTER — Encounter: Payer: Self-pay | Admitting: Oncology

## 2021-03-09 ENCOUNTER — Inpatient Hospital Stay (HOSPITAL_BASED_OUTPATIENT_CLINIC_OR_DEPARTMENT_OTHER): Payer: Medicaid Other | Admitting: Oncology

## 2021-03-09 ENCOUNTER — Inpatient Hospital Stay: Payer: Self-pay

## 2021-03-09 VITALS — BP 141/96 | HR 82 | Temp 98.0°F | Resp 18 | Wt 158.9 lb

## 2021-03-09 DIAGNOSIS — C8172 Other classical Hodgkin lymphoma, intrathoracic lymph nodes: Secondary | ICD-10-CM

## 2021-03-09 DIAGNOSIS — Z95828 Presence of other vascular implants and grafts: Secondary | ICD-10-CM

## 2021-03-09 LAB — CBC WITH DIFFERENTIAL/PLATELET
Abs Immature Granulocytes: 0.01 10*3/uL (ref 0.00–0.07)
Basophils Absolute: 0 10*3/uL (ref 0.0–0.1)
Basophils Relative: 1 %
Eosinophils Absolute: 0 10*3/uL (ref 0.0–0.5)
Eosinophils Relative: 1 %
HCT: 35.6 % — ABNORMAL LOW (ref 39.0–52.0)
Hemoglobin: 13.2 g/dL (ref 13.0–17.0)
Immature Granulocytes: 0 %
Lymphocytes Relative: 30 %
Lymphs Abs: 1.3 10*3/uL (ref 0.7–4.0)
MCH: 32.8 pg (ref 26.0–34.0)
MCHC: 37.1 g/dL — ABNORMAL HIGH (ref 30.0–36.0)
MCV: 88.3 fL (ref 80.0–100.0)
Monocytes Absolute: 0.8 10*3/uL (ref 0.1–1.0)
Monocytes Relative: 17 %
Neutro Abs: 2.4 10*3/uL (ref 1.7–7.7)
Neutrophils Relative %: 51 %
Platelets: 223 10*3/uL (ref 150–400)
RBC: 4.03 MIL/uL — ABNORMAL LOW (ref 4.22–5.81)
RDW: 13.2 % (ref 11.5–15.5)
WBC: 4.6 10*3/uL (ref 4.0–10.5)
nRBC: 0 % (ref 0.0–0.2)

## 2021-03-09 LAB — COMPREHENSIVE METABOLIC PANEL
ALT: 94 U/L — ABNORMAL HIGH (ref 0–44)
AST: 92 U/L — ABNORMAL HIGH (ref 15–41)
Albumin: 4.4 g/dL (ref 3.5–5.0)
Alkaline Phosphatase: 56 U/L (ref 38–126)
Anion gap: 13 (ref 5–15)
BUN: <5 mg/dL — ABNORMAL LOW (ref 6–20)
CO2: 23 mmol/L (ref 22–32)
Calcium: 9.2 mg/dL (ref 8.9–10.3)
Chloride: 104 mmol/L (ref 98–111)
Creatinine, Ser: 0.69 mg/dL (ref 0.61–1.24)
GFR, Estimated: >60 mL/min (ref >60)
Glucose, Bld: 89 mg/dL (ref 70–99)
Potassium: 3.6 mmol/L (ref 3.5–5.1)
Sodium: 140 mmol/L (ref 135–145)
Total Bilirubin: 0.7 mg/dL (ref 0.3–1.2)
Total Protein: 7.3 g/dL (ref 6.5–8.1)

## 2021-03-09 LAB — C-REACTIVE PROTEIN: CRP: 0.8 mg/dL (ref <1.0)

## 2021-03-09 MED ORDER — HEPARIN SOD (PORK) LOCK FLUSH 100 UNIT/ML IV SOLN
500.0000 [IU] | Freq: Once | INTRAVENOUS | Status: AC
Start: 2021-03-09 — End: 2021-03-09
  Administered 2021-03-09: 500 [IU] via INTRAVENOUS
  Filled 2021-03-09: qty 5

## 2021-03-09 MED ORDER — SODIUM CHLORIDE 0.9% FLUSH
10.0000 mL | INTRAVENOUS | Status: DC | PRN
  Administered 2021-03-09: 10 mL via INTRAVENOUS
  Filled 2021-03-09: qty 10

## 2021-03-09 MED ORDER — HEPARIN SOD (PORK) LOCK FLUSH 100 UNIT/ML IV SOLN
INTRAVENOUS | Status: AC
  Filled 2021-03-09: qty 5

## 2021-03-10 ENCOUNTER — Encounter: Payer: Self-pay | Admitting: Oncology

## 2021-03-10 NOTE — Progress Notes (Signed)
Hematology/Oncology follow up note [facility] [phone] [phone]   Patient Care Team: Patient, No Pcp Per (Inactive) as PCP - General (General Practice)  REFERRING PROVIDER: No ref. provider found  CHIEF COMPLAINTS/REASON FOR VISIT:  Follow up for hodgkin's lymphoma.   HISTORY OF PRESENTING ILLNESS:   Preston [name] is a  35 y.o.  male with PMH listed below was seen in consultation at the request of  No ref. provider found  for evaluation of Hodgkin's lymphoma.  Patient has a known diagnosis of Hodgkin's lymphoma and wants to transfer his oncology care to our cancer center.  Extensive medical records reviewed was performed  Previous oncology care was [place] Per note  10/28/2019 biopsy of right chest wall mass, fibromuscular soft tissue with atypical infiltrate consistent with Hodgkin lymphoma, classical type. There were scattered single atypical cells with morphology suggestive of Jaclynn Guarneri variant cells. These cells were reactive with CD 45, CD 15, CD 30 and nonreactive for pan keratin, cam 5.7, CD3, CD5, CD10, CD 20, melan-A, Sox 10. Ki-67 stained majority of Reed-Sternberg like cells.  Noticed right upper chest wall lump in January 2021. He was incarcerated for 27 months, released in May 2021.  Unintentional weight loss about 30 pounds, drenching night sweats.  01/17/2020 PET scan showed bulky anterior mediastinal mass 12.7cm. with extensive adenopathy in neck, right axilla, mediastinum, bilateral hilar regions, upper abdominal ligament adenopathy and retroperitoneal adenopathy. Abnormal hypermetabolic activity within a splenic focus. Mild patchy ground glass opacity in the right lower lobe may reflect postobstructive inflammatory change without hypermetabolic activity.   01/17/2020 Echo 1. Normal biventricular systolic function and size. LVEF 60%. Average global longitudinal strain is normal, -20%   2. No  significant valvular dysfunction.   3. Normal right-and left-sided filling pressures.  4. No prior study for comparison.  12/30/2019 LDH 588 01/24/2020 CRP 67.2, ESR 60  Stage IIIB Hodgkin's lymphoma, he was recommended ABVD x 2 cycles, followed by PET with plan of AVD x 4 or escalate to BEACOPP if not favorable results.   01/26/2020 ABVD Day 1,15 x 2 cycles.   03/24/2020 PET scan showed significant interval response to therapy. All PET positive lesions previously resolved 03/30/2020 AVD D1 04/13/2020 AVD D15  # He was not able to have chest medi port placed due to chest mass. He has central line placed on left upper extremity. #Central line access.  Patient has seen Dr. Lucky Cowboy for evaluation of the left upper extremity central line.  I discussed with Dr. Lucky Cowboy and tip is in good position in the SVC.  Okay to use from vascular surgeon's aspect.  [facility] pathology consultation on biopsy specimen from 10/28/2019 showed findings compatible with classic Hodgkin's lymphoma.  # 06/26/2020 - 11/29/20201 - Cycle 4 AVD  He no showed for his chemotherapy appointment in mid December 2021.    08/07/2020 seen at that time he reports acute onset of chest pain, left shoulder, back and left arm. He was sent to emergency room to have a CT scan done to rule out acute pulmonary embolism 08/07/2020, CT chest angiogram showed no pulmonary emboli or acute chest vascular pathology. Superior mediastinal lymphadenopathy consistent with clinical history of lymphoma.  Largest node measures 8.3 x 8.3 x 3.8 cm.  Several other smaller but pathologic lymph nodes in the anterior mediastinum, second largest at the aortopulmonary window measuring 1.3 x 1.9 x 3.1 cm.  No pulmonary mass or nodule.  Patient also had negative troponin,Patient was  discharged home for further follow-up with cancer center.  ER physician provided a prescription of Percocet 5/325 mg 1 tablet every 4 hours as needed for severe pain.  I obtained additional work-up  for work-up to see if there is lymphoma progression.  08/23/2020 PET scan showed dominant right paramidline anterior mediastinal mass lesion is stable in size comparing to her outside PET scan on 03/24/2020.  Lesion showed low-level FDG uptake Douville 3, not appreciably changed in the interval.  Mildly enlarged hepatoduodenal ligament lymph node measures minimally smaller on CT imaging today with Douville 2 uptake.  No additional area of unexpected or suspicious hypermetabolic zone.  No other findings of lymphadenopathy on noncontrast CT image today.  Patient was recommended to proceed with 2D echocardiogram and he had Echo done on 09/08/2020. The delay was due to him not showing up to appointment.  #09/08/2020  2D echocardiogram showed low normal LVEF 50% to 55%.  This is slightly decreased from his outside echocardiogram in June 2021 with LVEF of 60%.  09/01/2020- 09/15/2020 Cycle 5 AVD 2/222022 Cycle 6 D1 AVD  Neck shoulder pain worsen.   #10/18/2020  MRI cervical spine showed bulky degenerative disc extrusion at C5-C6 with severe spinal stenosis, spinal cord mass-effect and abnormal cord signal (edema and/or myelomalacia). Moderate to severe multifactorial right C6 foraminal stenosis. Smaller disc herniation at C3-C4, and lobulated disc bulging at C4-C5 with up to mild spinal stenosis at those levels. Up to moderate degenerative right C4, left C7, and moderate to severe bilateral C8 neural foraminal stenosis. 10/20/20 Patient underwent  C5-6 anterior cervical discectomy and fusion    11/10/2020 he was seen in the clinic prior to chemotherapy. At that time, he felt weak and has had weight loss due to his neck surgery. Therefore decision was made to hold chemo and give him 1-2 weeks for recovery.  Then he had multiple no show for chemotherapy treatment appointment.   # 5/26/.2022 cycle 6 D15 AVD.     INTERVAL HISTORY Preston [name] is a 35 y.o. male who has above history reviewed by me today  presents for follow up visit for management of Hodgkin's  Lymphoma  No new complaints. No constitutional symptoms.   Review of Systems  Constitutional:  Negative for appetite change, chills, fatigue, fever and unexpected weight change.  HENT:   Negative for hearing loss and voice change.   Eyes:  Negative for eye problems and icterus.  Respiratory:  Negative for chest tightness, cough and shortness of breath.   Cardiovascular:  Negative for leg swelling.  Gastrointestinal:  Negative for abdominal distention, abdominal pain and nausea.  Endocrine: Negative for hot flashes.  Genitourinary:  Negative for difficulty urinating, dysuria and frequency.   Musculoskeletal:  Negative for arthralgias, back pain and neck pain.  Skin:  Negative for itching and rash.  Neurological:  Negative for light-headedness and numbness.  Hematological:  Negative for adenopathy. Does not bruise/bleed easily.  Psychiatric/Behavioral:  Negative for confusion.    MEDICAL HISTORY:  Past Medical History:  Diagnosis Date   Hodgkin's lymphoma ([place])    Tobacco use     SURGICAL HISTORY: Past Surgical History:  Procedure Laterality Date   ANTERIOR CERVICAL DECOMP/DISCECTOMY FUSION N/A 10/20/2020   Procedure: ANTERIOR CERVICAL DECOMPRESSION/DISCECTOMY FUSION 1 LEVEL C5-C6;  Surgeon: Meade Maw, MD;  Location: [facility] ORS;  Service: Neurosurgery;  Laterality: N/A;    SOCIAL HISTORY: Social History   Socioeconomic History   Marital status: Single    Spouse name: Not on file  Number of children: Not on file   Years of education: Not on file   Highest education level: Not on file  Occupational History   Not on file  Tobacco Use   Smoking status: Every Day    Packs/day: 0.50    Years: 20.00    Pack years: 10.00    Types: Cigarettes   Smokeless tobacco: Never  Vaping Use   Vaping Use: Never used  Substance and Sexual Activity   Alcohol use: Yes    Comment: occasional    Drug use: Not Currently    Sexual activity: Not on file  Other Topics Concern   Not on file  Social History Narrative   Not on file   Social Determinants of Health   Financial Resource Strain: Not on file  Food Insecurity: Not on file  Transportation Needs: Not on file  Physical Activity: Not on file  Stress: Not on file  Social Connections: Not on file  Intimate Partner Violence: Not on file    FAMILY HISTORY: Family History  Problem Relation Age of Onset   Heart failure Mother    Pneumonia Father     ALLERGIES:  has No Known Allergies.  MEDICATIONS:  Current Outpatient Medications  Medication Sig Dispense Refill   acetaminophen (TYLENOL) 500 MG tablet Take 1,000 mg by mouth every 6 (six) hours as needed for mild pain or moderate pain.     ibuprofen (ADVIL) 200 MG tablet Take 600-800 mg by mouth every 6 (six) hours as needed for mild pain or moderate pain.     methocarbamol (ROBAXIN) 500 MG tablet Take 1 tablet (500 mg total) by mouth every 6 (six) hours as needed for muscle spasms. 21 tablet 0   ondansetron (ZOFRAN) 8 MG tablet Take 1 tablet (8 mg total) by mouth 2 (two) times daily. 60 tablet 1   prochlorperazine (COMPAZINE) 10 MG tablet Take 1 tablet (10 mg total) by mouth every 6 (six) hours as needed for nausea or vomiting. 60 tablet 1   dexamethasone (DECADRON) 4 MG tablet Take 2 tablets (8 mg total) by mouth daily. (Patient not taking: No sig reported) 6 tablet 0   omeprazole (PRILOSEC) 20 MG capsule Take 1 capsule (20 mg total) by mouth daily. (Patient not taking: No sig reported) 30 capsule 0   oxyCODONE-acetaminophen (PERCOCET) 5-325 MG tablet Take 1-2 tablets by mouth every 4 (four) hours as needed for moderate pain or severe pain. (Patient not taking: No sig reported) 30 tablet 0   No current facility-administered medications for this visit.   Facility-Administered Medications Ordered in Other Visits  Medication Dose Route Frequency Provider Last Rate Last Admin   heparin lock flush 100  unit/mL  500 Units Intravenous Once Earlie Server, MD       sodium chloride flush (NS) 0.9 % injection 10 mL  10 mL Intravenous PRN Earlie Server, MD   10 mL at 08/07/20 0826     PHYSICAL EXAMINATION: ECOG PERFORMANCE STATUS: 1 - Symptomatic but completely ambulatory Vitals:   03/09/21 1013  BP: (!) 141/96  Pulse: 82  Resp: 18  Temp: 98 F (36.7 C)   Filed Weights   03/09/21 1013  Weight: 158 lb 14.4 oz (72.1 kg)    Physical Exam Constitutional:      General: He is not in acute distress. HENT:     Head: Normocephalic and atraumatic.  Eyes:     General: No scleral icterus. Cardiovascular:     Rate and Rhythm: Normal rate  and regular rhythm.     Heart sounds: Normal heart sounds.  Pulmonary:     Effort: Pulmonary effort is normal. No respiratory distress.     Breath sounds: No wheezing.  Abdominal:     General: Bowel sounds are normal. There is no distension.     Palpations: Abdomen is soft.  Musculoskeletal:        General: No deformity. Normal range of motion.     Cervical back: Normal range of motion and neck supple.  Skin:    General: Skin is warm and dry.     Findings: No erythema or rash.  Neurological:     Mental Status: He is alert and oriented to person, place, and time. Mental status is at baseline.     Cranial Nerves: No cranial nerve deficit.     Coordination: Coordination normal.  Psychiatric:        Mood and Affect: Mood normal.  left upper arm central line access     LABORATORY DATA:  I have reviewed the data as listed Lab Results  Component Value Date   WBC 4.6 03/09/2021   HGB 13.2 03/09/2021   HCT 35.6 (L) 03/09/2021   MCV 88.3 03/09/2021   PLT 223 03/09/2021   Recent Labs    11/10/20 0917 01/01/21 0817 03/09/21 0949  NA 136 140 140  K 3.8 3.8 3.6  CL 100 103 104  CO2 _0 GLUCOSE 95 101* 89  BUN 9 11 <5*  CREATININE 0.72 0.68 0.69  CALCIUM 9.5 9.2 9.2  GFRNONAA >60 >60 >60  PROT 8.2* 7.5 7.3  ALBUMIN 4.6 4.2 4.4  AST 26 24 92*   ALT 20 26 94*  ALKPHOS 64 45 56  BILITOT 1.7* 1.0 0.7    Iron/TIBC/Ferritin/ %Sat No results found for: IRON, TIBC, FERRITIN, IRONPCTSAT    RADIOGRAPHIC STUDIES: I have personally reviewed the radiological images as listed and agreed with the findings in the report. NM PET Image Restag (PS) Skull Base To Thigh  Result Date: 02/27/2021 CLINICAL DATA:  Subsequent treatment strategy for Hodgkin's lymphoma. EXAM: NUCLEAR MEDICINE PET SKULL BASE TO THIGH TECHNIQUE: 8.97 mCi F-18 FDG was injected intravenously. Full-ring PET imaging was performed from the skull base to thigh after the radiotracer. CT data was obtained and used for attenuation correction and anatomic localization. Fasting blood glucose: 82 mg/dl COMPARISON:  PET-CT 08/23/2020. FINDINGS: Mediastinal blood pool activity: SUV max 2.02 Liver activity: SUV max 2.56 NECK: No hypermetabolic lymph nodes in the neck. On the prior study there was a small right-sided level III lymph node with SUV max of 2.0. I do not see this on today's study. Incidental CT findings: none CHEST: Persistent anterior mediastinal lymphadenopathy. This measures a maximum 7.1 x 4.0 cm on image 102/3. This previously measured a maximum of 8.5 x 4.4 cm. No hypermetabolism is demonstrated. SUV max is 1.63 less than that of mediastinal background activity (Deauville 2). No axillary adenopathy.  No pulmonary lesions on the CT scan. Incidental CT findings: The left-sided PICC line is stable. ABDOMEN/PELVIS: No abnormal hypermetabolic activity within the liver, pancreas, adrenal glands, or spleen. No hypermetabolic lymph nodes in the abdomen or pelvis. Incidental CT findings: Diffuse fatty infiltration of the liver. Early age advanced atherosclerotic calcifications involving the distal aorta and iliac arteries. No inguinal adenopathy. SKELETON: No focal hypermetabolic activity to suggest skeletal metastasis. Incidental CT findings: none IMPRESSION: 1. Persistent anterior  mediastinal lymphadenopathy but no hypermetabolism (Deauville 2). 2. No new or progressive  findings. Electronically Signed   By: Marijo Sanes M.D.   On: 02/27/2021 09:57      ASSESSMENT & PLAN:  1. 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 Port-A-Cath in place    # Hodgkin's lymphoma, classic type S/p 2 cycles of ABVD and 4 cycles of AVD.  PET scan images were independently reviewed by me and discussed with patient. Persistent anterior mediastinal lymphadenopathy 7.1 x 4cm, decreased in size. [Deauville 2]. No new or progressive findings. Discussed about surveillance plan.  Recommend CT chest abdomen pelvis w contrast in 6 months.   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 Follow-up with neurosurgery.  # Porta Cath in place. Recommend port flush every 8 weeks if keeping the port. Plan to reach out to Dr.Dew to see if his type of port is feasible to be kept long term.   All questions were answered. The patient knows to call the clinic with any problems questions or concerns.  Return of visit: 6 months. Lab - cbc,cmp, crp, esr, ldh  MD  Earlie Server, MD, PhD Hematology Oncology [facility] at [facility] Pager- [phone] 03/10/2021

## 2021-03-14 ENCOUNTER — Other Ambulatory Visit: Payer: Self-pay

## 2021-03-14 ENCOUNTER — Telehealth (INDEPENDENT_AMBULATORY_CARE_PROVIDER_SITE_OTHER): Payer: Self-pay

## 2021-03-14 DIAGNOSIS — C8172 Other classical Hodgkin lymphoma, intrathoracic lymph nodes: Secondary | ICD-10-CM

## 2021-03-14 NOTE — Telephone Encounter (Signed)
I called the pt and left a VM for the pt to call back to have his porta cath removed.

## 2021-03-22 ENCOUNTER — Telehealth (INDEPENDENT_AMBULATORY_CARE_PROVIDER_SITE_OTHER): Payer: Self-pay

## 2021-03-22 ENCOUNTER — Encounter (INDEPENDENT_AMBULATORY_CARE_PROVIDER_SITE_OTHER): Payer: Self-pay

## 2021-03-22 ENCOUNTER — Encounter: Payer: Self-pay | Admitting: Oncology

## 2021-03-22 NOTE — Telephone Encounter (Signed)
Patient port removal schedule with Dr Lucky Cowboy arrival time 12 pm on 03/29/2021. Pre-procedure instructions were gone over with the patient and will mailed out

## 2021-03-26 ENCOUNTER — Encounter: Payer: Self-pay | Admitting: Oncology

## 2021-03-26 NOTE — Telephone Encounter (Signed)
Patient port removal has moved up to 8:45 am with arrival 7:45 on 03/29/21. Patient was made aware with schedule changes and verbalized understanding.

## 2021-03-28 ENCOUNTER — Other Ambulatory Visit (INDEPENDENT_AMBULATORY_CARE_PROVIDER_SITE_OTHER): Payer: Self-pay | Admitting: Nurse Practitioner

## 2021-03-28 NOTE — Progress Notes (Signed)
Patient's phone number that he was reached at: [phone].

## 2021-03-28 NOTE — Progress Notes (Signed)
Per Dr. Lucky Cowboy, patient has been scheduled for later in the morning for his procedure on 8/18.  The patient will be at the [facility] at 10am for an 11am procedure start time on 8/18.

## 2021-03-29 ENCOUNTER — Ambulatory Visit: Admission: RE | Admit: 2021-03-29 | Payer: Medicaid Other | Source: Home / Self Care | Admitting: Vascular Surgery

## 2021-03-29 DIAGNOSIS — C8190 Hodgkin lymphoma, unspecified, unspecified site: Secondary | ICD-10-CM

## 2021-03-29 SURGERY — PORTA CATH REMOVAL
Anesthesia: Moderate Sedation

## 2021-05-04 ENCOUNTER — Inpatient Hospital Stay: Payer: Medicaid Other | Attending: Oncology

## 2021-06-29 ENCOUNTER — Inpatient Hospital Stay: Payer: Medicaid Other | Attending: Oncology

## 2021-08-01 ENCOUNTER — Telehealth: Payer: Self-pay | Admitting: Oncology

## 2021-08-01 NOTE — Telephone Encounter (Signed)
He's scheduled to see MD after scan. He may not know about appt. Winferd Humphrey will you please reach out to him

## 2021-08-01 NOTE — Telephone Encounter (Signed)
Pt called to set up an appt to see MD. Call back at [phone]

## 2021-08-24 ENCOUNTER — Inpatient Hospital Stay: Payer: Medicaid Other

## 2021-08-31 ENCOUNTER — Inpatient Hospital Stay: Payer: Medicaid Other | Attending: Oncology

## 2021-08-31 ENCOUNTER — Other Ambulatory Visit: Payer: Self-pay

## 2021-08-31 DIAGNOSIS — C8172 Other classical Hodgkin lymphoma, intrathoracic lymph nodes: Secondary | ICD-10-CM

## 2021-08-31 DIAGNOSIS — Z8572 Personal history of non-Hodgkin lymphomas: Secondary | ICD-10-CM | POA: Insufficient documentation

## 2021-08-31 LAB — COMPREHENSIVE METABOLIC PANEL
ALT: 48 U/L — ABNORMAL HIGH (ref 0–44)
AST: 56 U/L — ABNORMAL HIGH (ref 15–41)
Albumin: 4.6 g/dL (ref 3.5–5.0)
Alkaline Phosphatase: 48 U/L (ref 38–126)
Anion gap: 9 (ref 5–15)
BUN: 12 mg/dL (ref 6–20)
CO2: 25 mmol/L (ref 22–32)
Calcium: 9.3 mg/dL (ref 8.9–10.3)
Chloride: 100 mmol/L (ref 98–111)
Creatinine, Ser: 0.71 mg/dL (ref 0.61–1.24)
GFR, Estimated: >60 mL/min (ref >60)
Glucose, Bld: 104 mg/dL — ABNORMAL HIGH (ref 70–99)
Potassium: 4 mmol/L (ref 3.5–5.1)
Sodium: 134 mmol/L — ABNORMAL LOW (ref 135–145)
Total Bilirubin: 1.5 mg/dL — ABNORMAL HIGH (ref 0.3–1.2)
Total Protein: 7.5 g/dL (ref 6.5–8.1)

## 2021-08-31 LAB — CBC WITH DIFFERENTIAL/PLATELET
Abs Immature Granulocytes: 0.01 10*3/uL (ref 0.00–0.07)
Basophils Absolute: 0 10*3/uL (ref 0.0–0.1)
Basophils Relative: 1 %
Eosinophils Absolute: 0 10*3/uL (ref 0.0–0.5)
Eosinophils Relative: 1 %
HCT: 36.5 % — ABNORMAL LOW (ref 39.0–52.0)
Hemoglobin: 13.7 g/dL (ref 13.0–17.0)
Immature Granulocytes: 0 %
Lymphocytes Relative: 27 %
Lymphs Abs: 1.1 10*3/uL (ref 0.7–4.0)
MCH: 32.9 pg (ref 26.0–34.0)
MCHC: 37.5 g/dL — ABNORMAL HIGH (ref 30.0–36.0)
MCV: 87.7 fL (ref 80.0–100.0)
Monocytes Absolute: 0.7 10*3/uL (ref 0.1–1.0)
Monocytes Relative: 17 %
Neutro Abs: 2.3 10*3/uL (ref 1.7–7.7)
Neutrophils Relative %: 54 %
Platelets: 255 10*3/uL (ref 150–400)
RBC: 4.16 MIL/uL — ABNORMAL LOW (ref 4.22–5.81)
RDW: 13.1 % (ref 11.5–15.5)
WBC: 4.2 10*3/uL (ref 4.0–10.5)
nRBC: 0 % (ref 0.0–0.2)

## 2021-08-31 LAB — SEDIMENTATION RATE: Sed Rate: 1 mm/hr (ref 0–15)

## 2021-08-31 LAB — C-REACTIVE PROTEIN: CRP: 0.5 mg/dL (ref <1.0)

## 2021-08-31 LAB — LACTATE DEHYDROGENASE: LDH: 90 U/L — ABNORMAL LOW (ref 98–192)

## 2021-09-10 ENCOUNTER — Other Ambulatory Visit: Payer: Self-pay

## 2021-09-10 ENCOUNTER — Ambulatory Visit
Admission: RE | Admit: 2021-09-10 | Discharge: 2021-09-10 | Disposition: A | Discharge status: Home / Self Care | Payer: Medicaid Other | Source: Ambulatory Visit | Attending: Oncology | Admitting: Oncology

## 2021-09-10 DIAGNOSIS — C8172 Other classical Hodgkin lymphoma, intrathoracic lymph nodes: Secondary | ICD-10-CM

## 2021-09-10 IMAGING — CT CT CHEST-ABD-PELV W/ CM
2 of 4 series · 13 of 36 positions shown, 15 images · IV contrast (agent unspecified)
Comparison: PET-CT [DATE]

CLINICAL DATA: Follow-up Hodgkin's lymphoma.

EXAM:
CT CHEST, ABDOMEN, AND PELVIS WITH CONTRAST
TECHNIQUE: Multidetector CT imaging of the chest, abdomen and pelvis was
performed following the standard protocol during bolus
administration of intravenous contrast.

[Series 2: cap with · axial · 0.77mm/px · z∈[-932,-372]mm · 10 of 138 slices shown, 12 images]
[im 13/138  mediastinal]
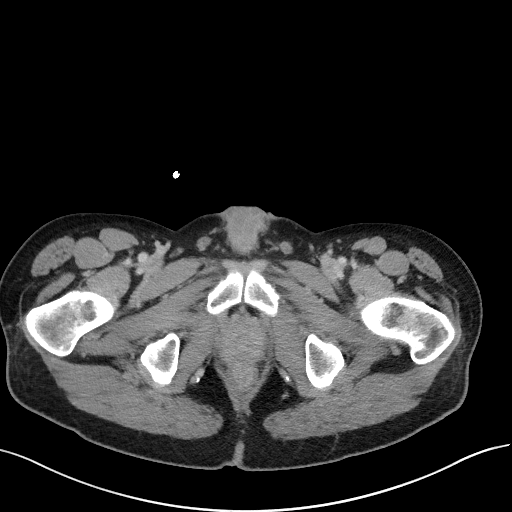
[im 13/138  bone]
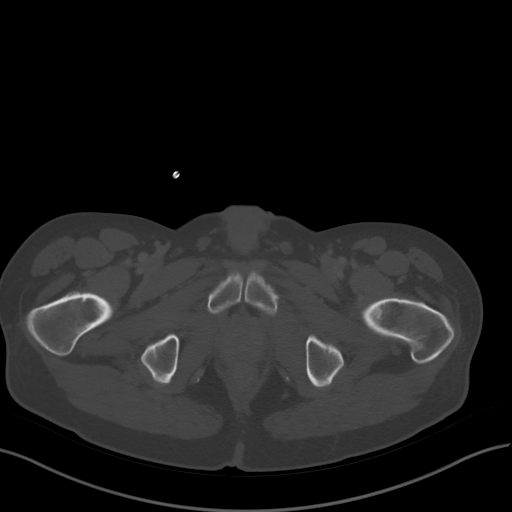
[im 25/138  mediastinal]
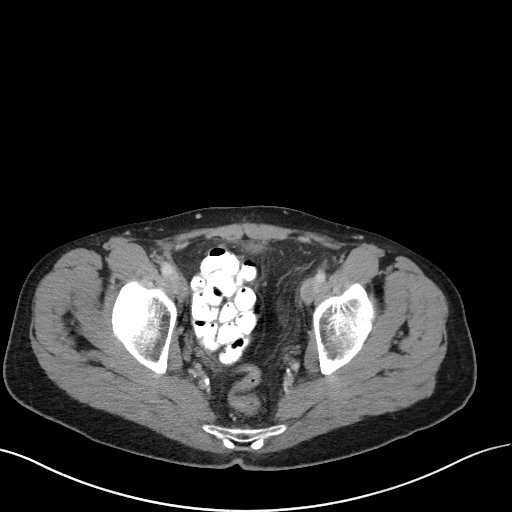
[im 38/138  mediastinal]
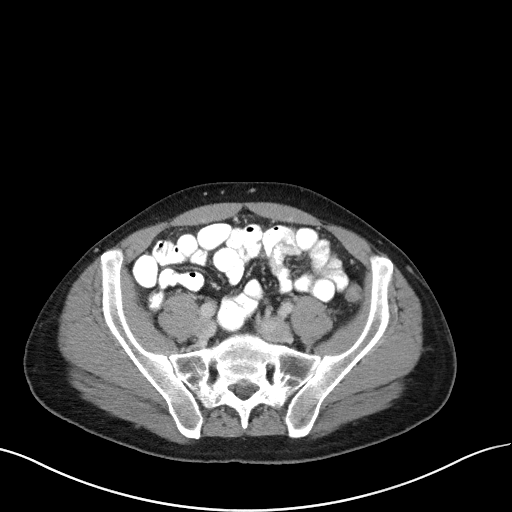
[im 50/138  mediastinal]
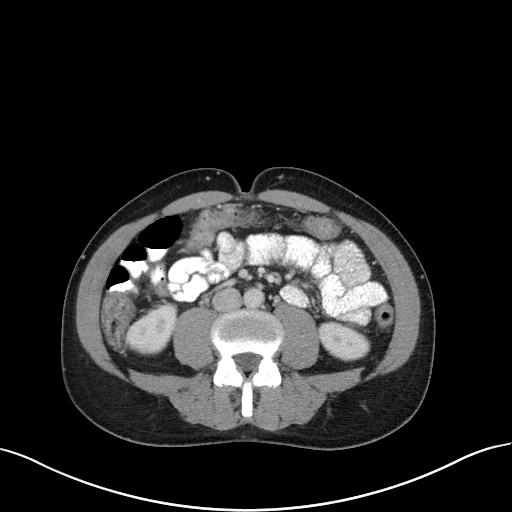
[im 63/138  mediastinal]
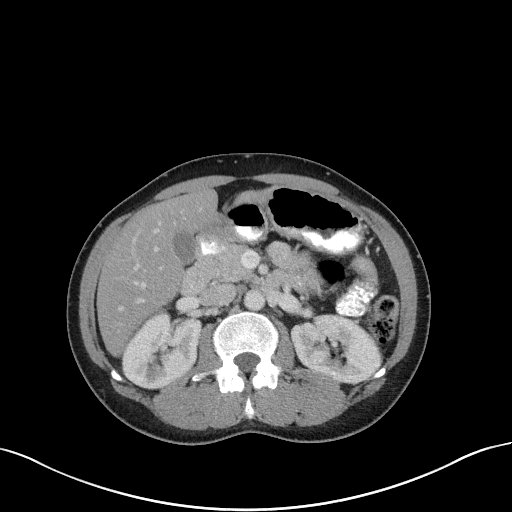
[im 75/138  mediastinal]
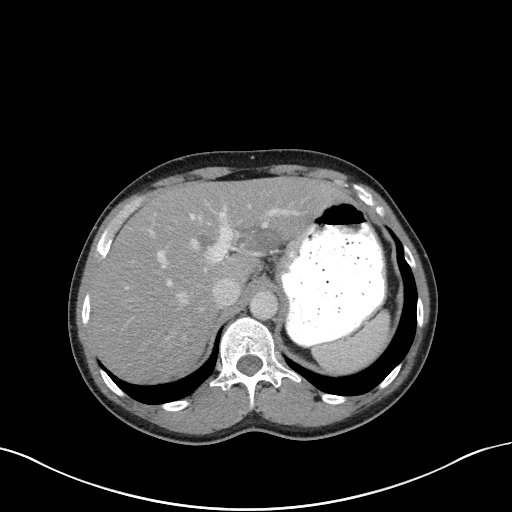
[im 88/138  mediastinal]
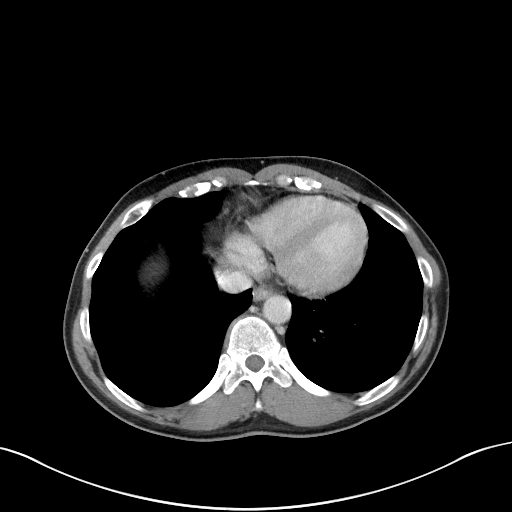
[im 100/138  mediastinal]
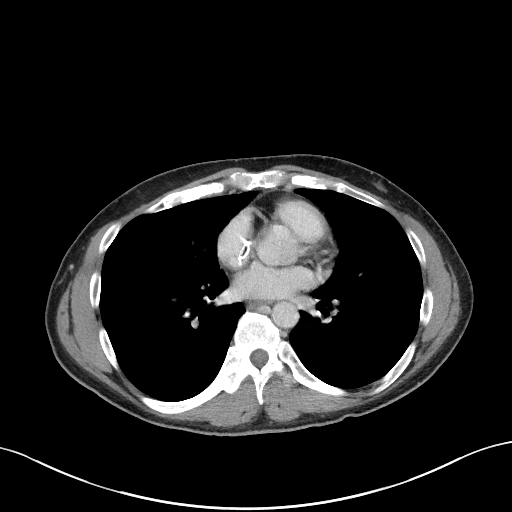
[im 113/138  mediastinal]
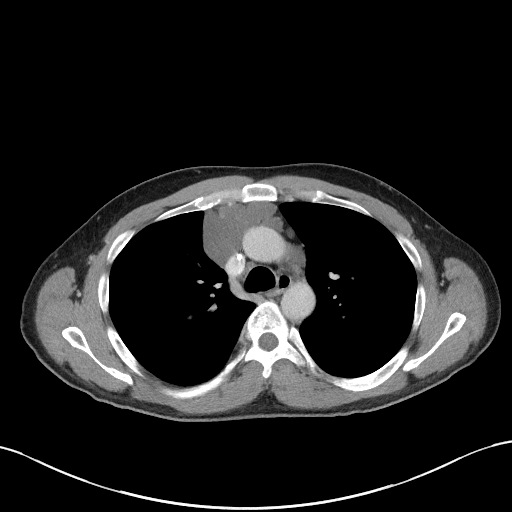
[im 113/138  bone]
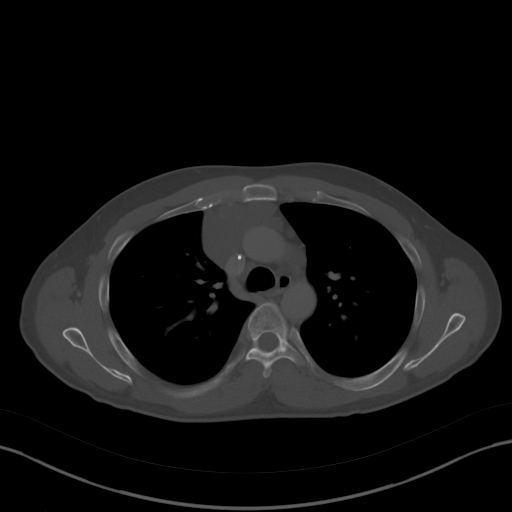
[im 125/138  mediastinal]
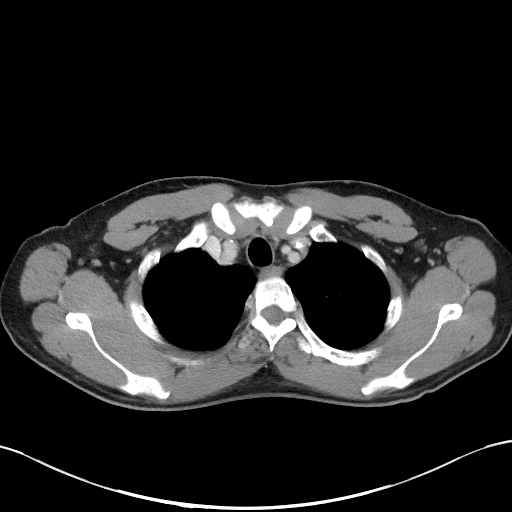

[Series 5: coronals · coronal · 0.78mm/px · 3 of 114 slices shown]
[im 23/114  mediastinal]
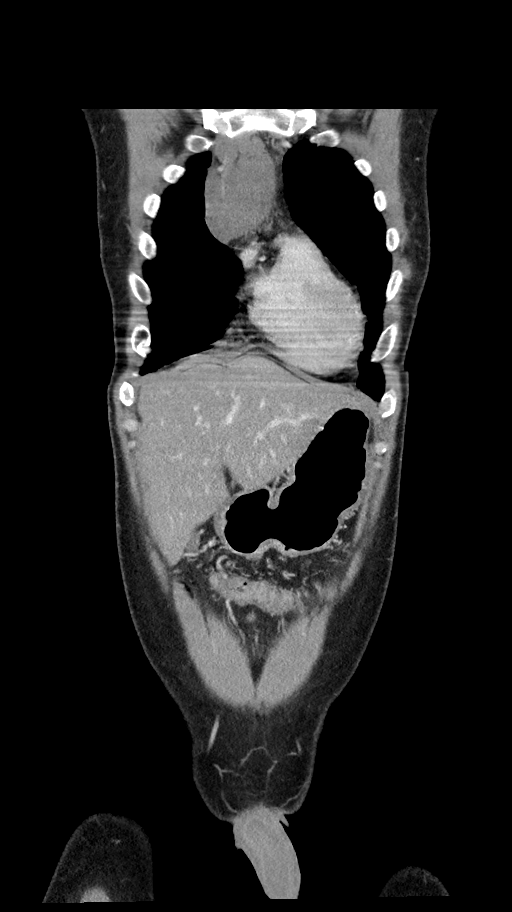
[im 46/114  mediastinal]
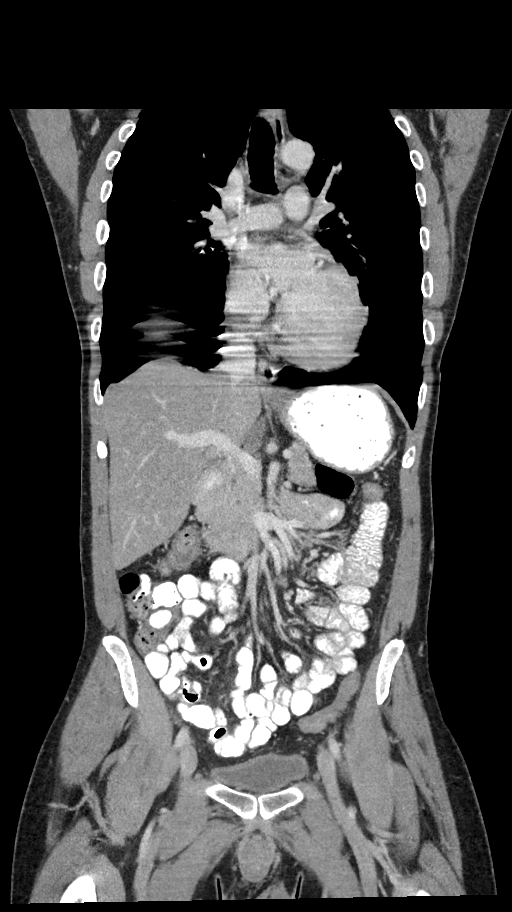
[im 68/114  mediastinal]
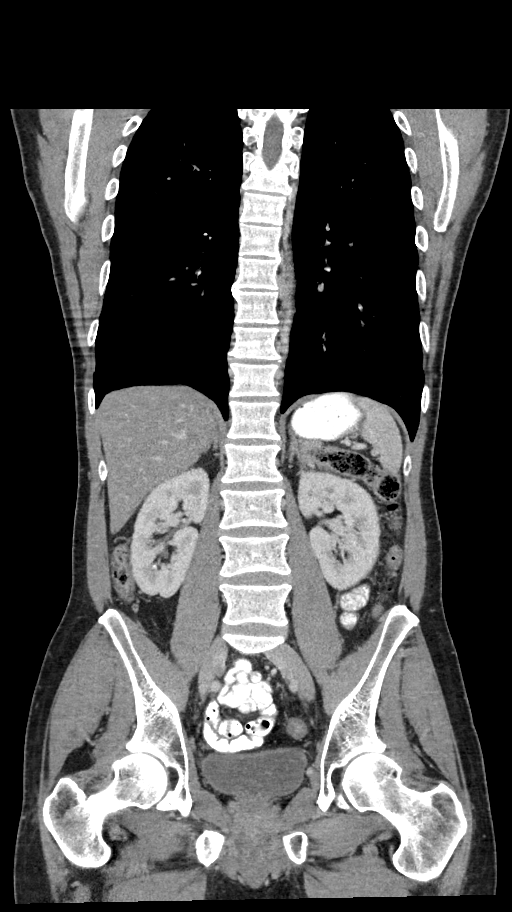

[13 of 36 positions shown; findings below may reference images not displayed]

RADIATION DOSE REDUCTION: This exam was performed according to the
departmental dose-optimization program which includes automated
exposure control, adjustment of the mA and/or kV according to
patient size and/or use of iterative reconstruction technique.

CONTRAST:  100mL OMNIPAQUE IOHEXOL 300 MG/ML  SOLN
FINDINGS: CT CHEST FINDINGS

Cardiovascular: The heart is normal in size. No pericardial
effusion. The aorta is normal in caliber. No dissection. The branch
vessels are patent. Scattered age advanced coronary artery
calcifications. The Port-A-Cath is in good position.

Mediastinum/Nodes: Persistent low attenuation measuring 6.6 x
cm. This measured approximately 7.1 x 4.0 cm on the prior PET-CT.
Smaller adjacent nodes are also stable with the largest pre-vascular
node measuring 15 mm. Small right hilar and subcarinal nodes are
also stable. No supraclavicular or axillary adenopathy. The thyroid
gland is unremarkable.

Lungs/Pleura: No acute pulmonary findings. No pulmonary lesions. No
pleural effusions or pleural nodules.

Musculoskeletal: No significant bony findings.

CT ABDOMEN PELVIS FINDINGS

Hepatobiliary: No hepatic lesions or intrahepatic biliary
dilatation. Stable diffuse fatty infiltration with more focal fat
noted along the posterior aspect of the left lobe. The portal and
hepatic veins are patent. No common bile duct dilatation.

Pancreas: No mass, inflammation or ductal dilatation.

Spleen: Normal size. No focal lesions.

Adrenals/Urinary Tract: The adrenal glands and kidneys are
unremarkable. Stable renal cysts. No worrisome renal lesions or
hydronephrosis. The bladder is unremarkable.

Stomach/Bowel: The stomach, duodenum, small bowel and colon are
unremarkable. No acute inflammatory changes, mass lesions or
obstructive findings. The terminal ileum and appendix are normal.

Vascular/Lymphatic: Scattered age advanced distal aortic and iliac
artery calcifications. No aneurysm or dissection. The major venous
structures are patent. There is a stable low-attenuation celiac axis
node on image 67/2 measuring 11 mm. No mesenteric or retroperitoneal
mass or adenopathy. No pelvic adenopathy.

Reproductive: The prostate gland and seminal vesicles are
unremarkable.

Other: No pelvic mass or adenopathy. No free pelvic fluid
collections. No inguinal mass or adenopathy. No abdominal wall
hernia or subcutaneous lesions.

Musculoskeletal: No significant bony findings.
IMPRESSION: 1. Overall stable mediastinal adenopathy. No new or progressive
findings.
2. Stable low-attenuation celiac axis node. No mesenteric,
retroperitoneal or pelvic adenopathy.
3. Stable diffuse fatty infiltration of the liver.
4. Stable renal cysts.
5. Age advanced coronary artery calcifications.

Aortic Atherosclerosis ([F0]-[F0]).

## 2021-09-10 MED ORDER — IOHEXOL 300 MG/ML  SOLN
100.0000 mL | Freq: Once | INTRAMUSCULAR | Status: AC | PRN
  Administered 2021-09-10: 100 mL via INTRAVENOUS

## 2021-09-11 ENCOUNTER — Encounter: Payer: Self-pay | Admitting: Oncology

## 2021-09-12 ENCOUNTER — Inpatient Hospital Stay: Payer: Medicaid Other

## 2021-09-12 ENCOUNTER — Encounter: Payer: Self-pay | Admitting: Oncology

## 2021-09-12 ENCOUNTER — Other Ambulatory Visit: Payer: Self-pay

## 2021-09-12 ENCOUNTER — Inpatient Hospital Stay: Payer: Self-pay | Attending: Oncology | Admitting: Oncology

## 2021-09-12 VITALS — BP 151/99 | Temp 97.0°F | Resp 18 | Wt 152.9 lb

## 2021-09-12 DIAGNOSIS — F17210 Nicotine dependence, cigarettes, uncomplicated: Secondary | ICD-10-CM | POA: Insufficient documentation

## 2021-09-12 DIAGNOSIS — C8172 Other classical Hodgkin lymphoma, intrathoracic lymph nodes: Secondary | ICD-10-CM

## 2021-09-12 DIAGNOSIS — R634 Abnormal weight loss: Secondary | ICD-10-CM | POA: Insufficient documentation

## 2021-09-12 DIAGNOSIS — M5412 Radiculopathy, cervical region: Secondary | ICD-10-CM | POA: Insufficient documentation

## 2021-09-12 DIAGNOSIS — Z95828 Presence of other vascular implants and grafts: Secondary | ICD-10-CM

## 2021-09-12 DIAGNOSIS — Z8572 Personal history of non-Hodgkin lymphomas: Secondary | ICD-10-CM | POA: Insufficient documentation

## 2021-09-12 MED ORDER — HEPARIN SOD (PORK) LOCK FLUSH 100 UNIT/ML IV SOLN
500.0000 [IU] | Freq: Once | INTRAVENOUS | Status: AC
Start: 2021-09-12 — End: 2021-09-12
  Administered 2021-09-12: 500 [IU] via INTRAVENOUS
  Filled 2021-09-12: qty 5

## 2021-09-12 MED ORDER — SODIUM CHLORIDE 0.9% FLUSH
10.0000 mL | Freq: Once | INTRAVENOUS | Status: AC
  Administered 2021-09-12: 10 mL via INTRAVENOUS
  Filled 2021-09-12: qty 10

## 2021-09-12 NOTE — Progress Notes (Signed)
Pt here for follow up.

## 2021-09-12 NOTE — Progress Notes (Signed)
Hematology/Oncology Progress note [phone] [phone]      Patient Care Team: Patient, No Pcp Per (Inactive) as PCP - General (General Practice)  REFERRING PROVIDER: No ref. provider found  CHIEF COMPLAINTS/REASON FOR VISIT:  Follow up for hodgkin's lymphoma.   HISTORY OF PRESENTING ILLNESS:   Preston [name] is a  36 y.o.  male with PMH listed below was seen in consultation at the request of  No ref. provider found  for evaluation of Hodgkin's lymphoma.  Patient has a known diagnosis of Hodgkin's lymphoma and wants to transfer his oncology care to our cancer center.  Extensive medical records reviewed was performed  Previous oncology care was [place] Per note  10/28/2019 biopsy of right chest wall mass, fibromuscular soft tissue with atypical infiltrate consistent with Hodgkin lymphoma, classical type. There were scattered single atypical cells with morphology suggestive of Jaclynn Guarneri variant cells. These cells were reactive with CD 45, CD 15, CD 30 and nonreactive for pan keratin, cam 5.7, CD3, CD5, CD10, CD 20, melan-A, Sox 10. Ki-67 stained majority of Reed-Sternberg like cells.  Noticed right upper chest wall lump in January 2021. He was incarcerated for 27 months, released in May 2021.  Unintentional weight loss about 30 pounds, drenching night sweats.  01/17/2020 PET scan showed bulky anterior mediastinal mass 12.7cm. with extensive adenopathy in neck, right axilla, mediastinum, bilateral hilar regions, upper abdominal ligament adenopathy and retroperitoneal adenopathy. Abnormal hypermetabolic activity within a splenic focus. Mild patchy ground glass opacity in the right lower lobe may reflect postobstructive inflammatory change without hypermetabolic activity.   01/17/2020 Echo 1. Normal biventricular systolic function and size. LVEF 60%. Average global longitudinal strain is normal, -20%   2. No significant valvular  dysfunction.   3. Normal right-and left-sided filling pressures.  4. No prior study for comparison.  12/30/2019 LDH 588 01/24/2020 CRP 67.2, ESR 60  Stage IIIB Hodgkin's lymphoma, he was recommended ABVD x 2 cycles, followed by PET with plan of AVD x 4 or escalate to BEACOPP if not favorable results.   01/26/2020 ABVD Day 1,15 x 2 cycles.   03/24/2020 PET scan showed significant interval response to therapy. All PET positive lesions previously resolved 03/30/2020 AVD D1 04/13/2020 AVD D15  # He was not able to have chest medi port placed due to chest mass. He has central line placed on left upper extremity. #Central line access.  Patient has seen Dr. Lucky Cowboy for evaluation of the left upper extremity central line.  I discussed with Dr. Lucky Cowboy and tip is in good position in the SVC.  Okay to use from vascular surgeon's aspect.  [facility] pathology consultation on biopsy specimen from 10/28/2019 showed findings compatible with classic Hodgkin's lymphoma.  # 06/26/2020 - 11/29/20201 - Cycle 4 AVD  He no showed for his chemotherapy appointment in mid December 2021.    08/07/2020 seen at that time he reports acute onset of chest pain, left shoulder, back and left arm. He was sent to emergency room to have a CT scan done to rule out acute pulmonary embolism 08/07/2020, CT chest angiogram showed no pulmonary emboli or acute chest vascular pathology. Superior mediastinal lymphadenopathy consistent with clinical history of lymphoma.  Largest node measures 8.3 x 8.3 x 3.8 cm.  Several other smaller but pathologic lymph nodes in the anterior mediastinum, second largest at the aortopulmonary window measuring 1.3 x 1.9 x 3.1 cm.  No pulmonary mass or nodule.  Patient also had negative troponin,Patient was discharged home  for further follow-up with cancer center.  ER physician provided a prescription of Percocet 5/325 mg 1 tablet every 4 hours as needed for severe pain.  I obtained additional work-up for work-up to see if  there is lymphoma progression.  08/23/2020 PET scan showed dominant right paramidline anterior mediastinal mass lesion is stable in size comparing to her outside PET scan on 03/24/2020.  Lesion showed low-level FDG uptake [place] 3, not appreciably changed in the interval.  Mildly enlarged hepatoduodenal ligament lymph node measures minimally smaller on CT imaging today with [place] 2 uptake.  No additional area of unexpected or suspicious hypermetabolic zone.  No other findings of lymphadenopathy on noncontrast CT image today.  Patient was recommended to proceed with 2D echocardiogram and he had Echo done on 09/08/2020. The delay was due to him not showing up to appointment.  #09/08/2020  2D echocardiogram showed low normal LVEF 50% to 55%.  This is slightly decreased from his outside echocardiogram in June 2021 with LVEF of 60%.  09/01/2020- 09/15/2020 Cycle 5 AVD 2/222022 Cycle 6 D1 AVD  Neck shoulder pain worsen.   #10/18/2020  MRI cervical spine showed bulky degenerative disc extrusion at C5-C6 with severe spinal stenosis, spinal cord mass-effect and abnormal cord signal (edema and/or myelomalacia). Moderate to severe multifactorial right C6 foraminal stenosis. Smaller disc herniation at C3-C4, and lobulated disc bulging at C4-C5 with up to mild spinal stenosis at those levels. Up to moderate degenerative right C4, left C7, and moderate to severe bilateral C8 neural foraminal stenosis. 10/20/20 Patient underwent  C5-6 anterior cervical discectomy and fusion    11/10/2020 he was seen in the clinic prior to chemotherapy. At that time, he felt weak and has had weight loss due to his neck surgery. Therefore decision was made to hold chemo and give him 1-2 weeks for recovery.  Then he had multiple no show for chemotherapy treatment appointment.   # 5/26/.2022 cycle 6 D15 AVD.     INTERVAL HISTORY Preston [name] is a 36 y.o. male who has above history reviewed by me today presents for follow up  visit for management of Hodgkin's  Lymphoma Patient denies constitutional symptoms.  He did not go to follow-up with Dr. Lucky Cowboy. He also did not keep his port flush appointments.  Denies any focal erythema, swelling. Patient has lost weight since last visit.  Review of Systems  Constitutional:  Negative for appetite change, chills, fatigue, fever and unexpected weight change.  HENT:   Negative for hearing loss and voice change.   Eyes:  Negative for eye problems and icterus.  Respiratory:  Negative for chest tightness, cough and shortness of breath.   Cardiovascular:  Negative for leg swelling.  Gastrointestinal:  Negative for abdominal distention, abdominal pain and nausea.  Endocrine: Negative for hot flashes.  Genitourinary:  Negative for difficulty urinating, dysuria and frequency.   Musculoskeletal:  Negative for arthralgias, back pain and neck pain.  Skin:  Negative for itching and rash.  Neurological:  Negative for light-headedness and numbness.  Hematological:  Negative for adenopathy. Does not bruise/bleed easily.  Psychiatric/Behavioral:  Negative for confusion.    MEDICAL HISTORY:  Past Medical History:  Diagnosis Date   Hodgkin's lymphoma ([place])    Tobacco use     SURGICAL HISTORY: Past Surgical History:  Procedure Laterality Date   ANTERIOR CERVICAL DECOMP/DISCECTOMY FUSION N/A 10/20/2020   Procedure: ANTERIOR CERVICAL DECOMPRESSION/DISCECTOMY FUSION 1 LEVEL C5-C6;  Surgeon: Meade Maw, MD;  Location: [facility] ORS;  Service: Neurosurgery;  Laterality:  N/A;    SOCIAL HISTORY: Social History   Socioeconomic History   Marital status: Married    Spouse name: Not on file   Number of children: Not on file   Years of education: Not on file   Highest education level: Not on file  Occupational History   Not on file  Tobacco Use   Smoking status: Every Day    Packs/day: 0.50    Years: 20.00    Pack years: 10.00    Types: Cigarettes   Smokeless tobacco: Never   Vaping Use   Vaping Use: Never used  Substance and Sexual Activity   Alcohol use: Yes    Comment: occasional    Drug use: Not Currently   Sexual activity: Not on file  Other Topics Concern   Not on file  Social History Narrative   Not on file   Social Determinants of Health   Financial Resource Strain: Not on file  Food Insecurity: Not on file  Transportation Needs: Not on file  Physical Activity: Not on file  Stress: Not on file  Social Connections: Not on file  Intimate Partner Violence: Not on file    FAMILY HISTORY: Family History  Problem Relation Age of Onset   Heart failure Mother    Pneumonia Father     ALLERGIES:  has No Known Allergies.  MEDICATIONS:  Current Outpatient Medications  Medication Sig Dispense Refill   acetaminophen (TYLENOL) 500 MG tablet Take 1,000 mg by mouth every 6 (six) hours as needed for mild pain or moderate pain.     ibuprofen (ADVIL) 200 MG tablet Take 600-800 mg by mouth every 6 (six) hours as needed for mild pain or moderate pain.     dexamethasone (DECADRON) 4 MG tablet Take 2 tablets (8 mg total) by mouth daily. (Patient not taking: Reported on 01/01/2021) 6 tablet 0   methocarbamol (ROBAXIN) 500 MG tablet Take 1 tablet (500 mg total) by mouth every 6 (six) hours as needed for muscle spasms. (Patient not taking: Reported on 09/12/2021) 21 tablet 0   omeprazole (PRILOSEC) 20 MG capsule Take 1 capsule (20 mg total) by mouth daily. (Patient not taking: Reported on 11/10/2020) 30 capsule 0   ondansetron (ZOFRAN) 8 MG tablet Take 1 tablet (8 mg total) by mouth 2 (two) times daily. (Patient not taking: Reported on 09/12/2021) 60 tablet 1   oxyCODONE-acetaminophen (PERCOCET) 5-325 MG tablet Take 1-2 tablets by mouth every 4 (four) hours as needed for moderate pain or severe pain. (Patient not taking: Reported on 11/10/2020) 30 tablet 0   prochlorperazine (COMPAZINE) 10 MG tablet Take 1 tablet (10 mg total) by mouth every 6 (six) hours as needed for  nausea or vomiting. (Patient not taking: Reported on 09/12/2021) 60 tablet 1   No current facility-administered medications for this visit.   Facility-Administered Medications Ordered in Other Visits  Medication Dose Route Frequency Provider Last Rate Last Admin   heparin lock flush 100 unit/mL  500 Units Intravenous Once Earlie Server, MD       sodium chloride flush (NS) 0.9 % injection 10 mL  10 mL Intravenous PRN Earlie Server, MD   10 mL at 08/07/20 0826     PHYSICAL EXAMINATION: ECOG PERFORMANCE STATUS: 1 - Symptomatic but completely ambulatory Vitals:   09/12/21 1338  BP: (!) 151/99  Resp: 18  Temp: (!) 97 F (36.1 C)   Filed Weights   09/12/21 1338  Weight: 152 lb 14.4 oz (69.4 kg)    Physical  Exam Constitutional:      General: He is not in acute distress. HENT:     Head: Normocephalic and atraumatic.  Eyes:     General: No scleral icterus. Cardiovascular:     Rate and Rhythm: Normal rate and regular rhythm.     Heart sounds: Normal heart sounds.  Pulmonary:     Effort: Pulmonary effort is normal. No respiratory distress.     Breath sounds: No wheezing.  Abdominal:     General: Bowel sounds are normal. There is no distension.     Palpations: Abdomen is soft.  Musculoskeletal:        General: No deformity. Normal range of motion.     Cervical back: Normal range of motion and neck supple.  Skin:    General: Skin is warm and dry.     Findings: No erythema or rash.  Neurological:     Mental Status: He is alert and oriented to person, place, and time. Mental status is at baseline.     Cranial Nerves: No cranial nerve deficit.     Coordination: Coordination normal.  Psychiatric:        Mood and Affect: Mood normal.  left upper arm central line access     LABORATORY DATA:  I have reviewed the data as listed Lab Results  Component Value Date   WBC 4.2 08/31/2021   HGB 13.7 08/31/2021   HCT 36.5 (L) 08/31/2021   MCV 87.7 08/31/2021   PLT 255 08/31/2021   Recent  Labs    01/01/21 0817 03/09/21 0949 08/31/21 1005  NA 140 140 134*  K 3.8 3.6 4.0  CL 103 104 100  CO2 _0 GLUCOSE 101* 89 104*  BUN 11 <5* 12  CREATININE 0.68 0.69 0.71  CALCIUM 9.2 9.2 9.3  GFRNONAA >60 >60 >60  PROT 7.5 7.3 7.5  ALBUMIN 4.2 4.4 4.6  AST 24 92* 56*  ALT 26 94* 48*  ALKPHOS 45 56 48  BILITOT 1.0 0.7 1.5*    Iron/TIBC/Ferritin/ %Sat No results found for: IRON, TIBC, FERRITIN, IRONPCTSAT    RADIOGRAPHIC STUDIES: I have personally reviewed the radiological images as listed and agreed with the findings in the report. CT CHEST ABDOMEN PELVIS W CONTRAST  Result Date: 09/11/2021 CLINICAL DATA:  Follow-up Hodgkin's lymphoma. EXAM: CT CHEST, ABDOMEN, AND PELVIS WITH CONTRAST TECHNIQUE: Multidetector CT imaging of the chest, abdomen and pelvis was performed following the standard protocol during bolus administration of intravenous contrast. RADIATION DOSE REDUCTION: This exam was performed according to the departmental dose-optimization program which includes automated exposure control, adjustment of the mA and/or kV according to patient size and/or use of iterative reconstruction technique. CONTRAST:  182m OMNIPAQUE IOHEXOL 300 MG/ML  SOLN COMPARISON:  PET-CT 02/24/2021 FINDINGS: CT CHEST FINDINGS Cardiovascular: The heart is normal in size. No pericardial effusion. The aorta is normal in caliber. No dissection. The branch vessels are patent. Scattered age advanced coronary artery calcifications. The Port-A-Cath is in good position. Mediastinum/Nodes: Persistent low attenuation measuring 6.6 x 3.8 cm. This measured approximately 7.1 x 4.0 cm on the prior PET-CT. Smaller adjacent nodes are also stable with the largest pre-vascular node measuring 15 mm. Small right hilar and subcarinal nodes are also stable. No supraclavicular or axillary adenopathy. The thyroid gland is unremarkable. Lungs/Pleura: No acute pulmonary findings. No pulmonary lesions. No pleural effusions or  pleural nodules. Musculoskeletal: No significant bony findings. CT ABDOMEN PELVIS FINDINGS Hepatobiliary: No hepatic lesions or intrahepatic biliary dilatation. Stable diffuse fatty  infiltration with more focal fat noted along the posterior aspect of the left lobe. The portal and hepatic veins are patent. No common bile duct dilatation. Pancreas: No mass, inflammation or ductal dilatation. Spleen: Normal size. No focal lesions. Adrenals/Urinary Tract: The adrenal glands and kidneys are unremarkable. Stable renal cysts. No worrisome renal lesions or hydronephrosis. The bladder is unremarkable. Stomach/Bowel: The stomach, duodenum, small bowel and colon are unremarkable. No acute inflammatory changes, mass lesions or obstructive findings. The terminal ileum and appendix are normal. Vascular/Lymphatic: Scattered age advanced distal aortic and iliac artery calcifications. No aneurysm or dissection. The major venous structures are patent. There is a stable low-attenuation celiac axis node on image 67/2 measuring 11 mm. No mesenteric or retroperitoneal mass or adenopathy. No pelvic adenopathy. Reproductive: The prostate gland and seminal vesicles are unremarkable. Other: No pelvic mass or adenopathy. No free pelvic fluid collections. No inguinal mass or adenopathy. No abdominal wall hernia or subcutaneous lesions. Musculoskeletal: No significant bony findings. IMPRESSION: 1. Overall stable mediastinal adenopathy. No new or progressive findings. 2. Stable low-attenuation celiac axis node. No mesenteric, retroperitoneal or pelvic adenopathy. 3. Stable diffuse fatty infiltration of the liver. 4. Stable renal cysts. 5. Age advanced coronary artery calcifications. Aortic Atherosclerosis (ICD10-I70.0). Electronically Signed   By: Marijo Sanes M.D.   On: 09/11/2021 08:57      ASSESSMENT & PLAN:  1. 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 Port-A-Cath in place    # Hodgkin's lymphoma,  classic type S/p 2 cycles of ABVD and 4 cycles of AVD.  09/10/2021 CT chest abdomen pelvis w contrast showed stable mediastinal adenopathy, no progressive findings.  Stable low-attenuation celiac axis node.  No mesenteric or retroperitoneal or pelvic adenopathy.  Stable diffuse fatty infiltration of the liver.  Stable renal cyst.  Age advanced coronary artery calcifications. Continue monitor.  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 Symptoms are stable.  # Porta Cath in place.  left antecubital fossa port, he has not flushed as instructed.  I discussed with vascular surgeon Dr.Dew.  Plan is to remove this port as it is associated with high risk of occlusion/thrombosis. Discussed with patient about vascular surgeons recommendation.  I advised patient to call Dr. Maryruth Bun office to schedule appointment.  All questions were answered. The patient knows to call the clinic with any problems questions or concerns.  Return of visit: 6 months. Lab - cbc,cmp, crp, esr, ldh  MD  Earlie Server, MD, PhD  09/12/2021

## 2021-09-18 ENCOUNTER — Telehealth (INDEPENDENT_AMBULATORY_CARE_PROVIDER_SITE_OTHER): Payer: Self-pay

## 2021-09-18 NOTE — Telephone Encounter (Signed)
I attempted to contact the patient to schedule him for a port removal. A message was left for a return call.

## 2021-09-18 NOTE — Telephone Encounter (Signed)
Preston [name] returned my call and is now scheduled with Dr. Lucky Cowboy for a port removal on 09/24/21 with a 6:45 am arrival time to the [facility]. Pre-procedure instructions were discussed and will be mailed.

## 2021-09-23 ENCOUNTER — Other Ambulatory Visit (INDEPENDENT_AMBULATORY_CARE_PROVIDER_SITE_OTHER): Payer: Self-pay | Admitting: Nurse Practitioner

## 2021-09-24 ENCOUNTER — Encounter: Admission: RE | Payer: Self-pay | Source: Home / Self Care

## 2021-09-24 ENCOUNTER — Ambulatory Visit: Admission: RE | Admit: 2021-09-24 | Payer: Medicaid Other | Source: Home / Self Care | Admitting: Vascular Surgery

## 2021-09-24 DIAGNOSIS — C8100 Nodular lymphocyte predominant Hodgkin lymphoma, unspecified site: Secondary | ICD-10-CM

## 2021-09-24 SURGERY — PORTA CATH REMOVAL
Anesthesia: Moderate Sedation

## 2021-09-24 MED ORDER — METHYLPREDNISOLONE SODIUM SUCC 125 MG IJ SOLR: 125.0000 mg | Freq: Once | INTRAMUSCULAR | Status: DC | PRN

## 2021-09-24 MED ORDER — CHLORHEXIDINE GLUCONATE CLOTH 2 % EX PADS: 6.0000 | MEDICATED_PAD | Freq: Every day | CUTANEOUS | Status: DC

## 2021-09-24 MED ORDER — MIDAZOLAM HCL 2 MG/ML PO SYRP: 8.0000 mg | ORAL_SOLUTION | Freq: Once | ORAL | Status: DC | PRN

## 2021-09-24 MED ORDER — DIPHENHYDRAMINE HCL 50 MG/ML IJ SOLN: 50.0000 mg | Freq: Once | INTRAMUSCULAR | Status: DC | PRN

## 2021-09-24 MED ORDER — CEFAZOLIN SODIUM-DEXTROSE 2-4 GM/100ML-% IV SOLN: 2.0000 g | Freq: Once | INTRAVENOUS | Status: DC

## 2021-09-24 MED ORDER — SODIUM CHLORIDE 0.9 % IV SOLN: INTRAVENOUS | Status: DC

## 2021-09-24 MED ORDER — FAMOTIDINE 20 MG PO TABS: 40.0000 mg | ORAL_TABLET | Freq: Once | ORAL | Status: DC | PRN

## 2021-10-17 ENCOUNTER — Telehealth (INDEPENDENT_AMBULATORY_CARE_PROVIDER_SITE_OTHER): Payer: Self-pay

## 2021-10-17 NOTE — Telephone Encounter (Signed)
Spoke with the patient and he has been rescheduled for his port removal with Dr. Lucky Cowboy. Patient is scheduled on 10/22/21 with a 9:30 am arrival time to the [facility]. Pre-procedure instructions were discussed and mailed. ?

## 2021-10-19 ENCOUNTER — Inpatient Hospital Stay: Payer: Medicaid Other | Attending: Oncology

## 2021-10-22 ENCOUNTER — Encounter: Admission: RE | Payer: Self-pay | Source: Home / Self Care

## 2021-10-22 ENCOUNTER — Other Ambulatory Visit (INDEPENDENT_AMBULATORY_CARE_PROVIDER_SITE_OTHER): Payer: Self-pay | Admitting: Nurse Practitioner

## 2021-10-22 ENCOUNTER — Ambulatory Visit: Admission: RE | Admit: 2021-10-22 | Payer: Medicaid Other | Source: Home / Self Care | Admitting: Vascular Surgery

## 2021-10-22 DIAGNOSIS — C8170 Other classical Hodgkin lymphoma, unspecified site: Secondary | ICD-10-CM

## 2021-10-22 DIAGNOSIS — C8100 Nodular lymphocyte predominant Hodgkin lymphoma, unspecified site: Secondary | ICD-10-CM

## 2021-10-22 SURGERY — PORTA CATH REMOVAL
Anesthesia: Moderate Sedation

## 2021-12-14 ENCOUNTER — Inpatient Hospital Stay: Payer: Medicaid Other | Attending: Oncology

## 2021-12-14 DIAGNOSIS — Z452 Encounter for adjustment and management of vascular access device: Secondary | ICD-10-CM | POA: Insufficient documentation

## 2021-12-14 DIAGNOSIS — Z95828 Presence of other vascular implants and grafts: Secondary | ICD-10-CM

## 2021-12-14 DIAGNOSIS — Z8571 Personal history of Hodgkin lymphoma: Secondary | ICD-10-CM | POA: Insufficient documentation

## 2021-12-14 MED ORDER — SODIUM CHLORIDE 0.9% FLUSH
10.0000 mL | Freq: Once | INTRAVENOUS | Status: AC
  Administered 2021-12-14: 10 mL via INTRAVENOUS
  Filled 2021-12-14: qty 10

## 2021-12-14 MED ORDER — HEPARIN SOD (PORK) LOCK FLUSH 100 UNIT/ML IV SOLN
500.0000 [IU] | Freq: Once | INTRAVENOUS | Status: AC
  Administered 2021-12-14: 500 [IU] via INTRAVENOUS
  Filled 2021-12-14: qty 5

## 2022-01-26 ENCOUNTER — Other Ambulatory Visit: Payer: Self-pay | Admitting: Nurse Practitioner

## 2022-02-08 ENCOUNTER — Inpatient Hospital Stay: Payer: Self-pay | Attending: Oncology

## 2022-02-08 DIAGNOSIS — Z452 Encounter for adjustment and management of vascular access device: Secondary | ICD-10-CM | POA: Insufficient documentation

## 2022-02-08 DIAGNOSIS — Z95828 Presence of other vascular implants and grafts: Secondary | ICD-10-CM

## 2022-02-08 DIAGNOSIS — Z8571 Personal history of Hodgkin lymphoma: Secondary | ICD-10-CM | POA: Insufficient documentation

## 2022-02-08 MED ORDER — SODIUM CHLORIDE 0.9% FLUSH
10.0000 mL | Freq: Once | INTRAVENOUS | Status: AC
  Administered 2022-02-08: 10 mL via INTRAVENOUS
  Filled 2022-02-08: qty 10

## 2022-02-08 MED ORDER — HEPARIN SOD (PORK) LOCK FLUSH 100 UNIT/ML IV SOLN
500.0000 [IU] | Freq: Once | INTRAVENOUS | Status: AC
  Administered 2022-02-08: 500 [IU] via INTRAVENOUS
  Filled 2022-02-08: qty 5

## 2022-03-12 ENCOUNTER — Inpatient Hospital Stay: Payer: Self-pay | Attending: Oncology

## 2022-03-12 ENCOUNTER — Ambulatory Visit
Admission: RE | Admit: 2022-03-12 | Discharge: 2022-03-12 | Disposition: A | Discharge status: Home / Self Care | Payer: Medicaid Other | Source: Ambulatory Visit | Attending: Oncology | Admitting: Oncology

## 2022-03-12 DIAGNOSIS — R634 Abnormal weight loss: Secondary | ICD-10-CM | POA: Insufficient documentation

## 2022-03-12 DIAGNOSIS — C8192 Hodgkin lymphoma, unspecified, intrathoracic lymph nodes: Secondary | ICD-10-CM | POA: Insufficient documentation

## 2022-03-12 DIAGNOSIS — F17210 Nicotine dependence, cigarettes, uncomplicated: Secondary | ICD-10-CM | POA: Insufficient documentation

## 2022-03-12 DIAGNOSIS — C8172 Other classical Hodgkin lymphoma, intrathoracic lymph nodes: Secondary | ICD-10-CM | POA: Insufficient documentation

## 2022-03-12 DIAGNOSIS — M5412 Radiculopathy, cervical region: Secondary | ICD-10-CM | POA: Insufficient documentation

## 2022-03-12 DIAGNOSIS — R7401 Elevation of levels of liver transaminase levels: Secondary | ICD-10-CM | POA: Insufficient documentation

## 2022-03-12 LAB — POCT I-STAT CREATININE: Creatinine, Ser: 0.6 mg/dL — ABNORMAL LOW (ref 0.61–1.24)

## 2022-03-12 MED ORDER — IOHEXOL 300 MG/ML  SOLN
100.0000 mL | Freq: Once | INTRAMUSCULAR | Status: AC | PRN
  Administered 2022-03-12: 100 mL via INTRAVENOUS

## 2022-03-14 ENCOUNTER — Inpatient Hospital Stay: Payer: Self-pay

## 2022-03-14 ENCOUNTER — Inpatient Hospital Stay (HOSPITAL_BASED_OUTPATIENT_CLINIC_OR_DEPARTMENT_OTHER): Payer: Self-pay | Admitting: Oncology

## 2022-03-14 ENCOUNTER — Encounter: Payer: Self-pay | Admitting: Oncology

## 2022-03-14 VITALS — BP 143/96 | HR 72 | Temp 99.4°F | Resp 18 | Wt 149.3 lb

## 2022-03-14 DIAGNOSIS — R7401 Elevation of levels of liver transaminase levels: Secondary | ICD-10-CM

## 2022-03-14 DIAGNOSIS — Z95828 Presence of other vascular implants and grafts: Secondary | ICD-10-CM

## 2022-03-14 DIAGNOSIS — R634 Abnormal weight loss: Secondary | ICD-10-CM

## 2022-03-14 DIAGNOSIS — C8172 Other classical Hodgkin lymphoma, intrathoracic lymph nodes: Secondary | ICD-10-CM

## 2022-03-14 DIAGNOSIS — C8170 Other classical Hodgkin lymphoma, unspecified site: Secondary | ICD-10-CM

## 2022-03-14 LAB — CBC WITH DIFFERENTIAL/PLATELET
Abs Immature Granulocytes: 0.04 10*3/uL (ref 0.00–0.07)
Basophils Absolute: 0 10*3/uL (ref 0.0–0.1)
Basophils Relative: 1 %
Eosinophils Absolute: 0 10*3/uL (ref 0.0–0.5)
Eosinophils Relative: 0 %
HCT: 33.1 % — ABNORMAL LOW (ref 39.0–52.0)
Hemoglobin: 12 g/dL — ABNORMAL LOW (ref 13.0–17.0)
Immature Granulocytes: 1 %
Lymphocytes Relative: 18 %
Lymphs Abs: 1.1 10*3/uL (ref 0.7–4.0)
MCH: 30.8 pg (ref 26.0–34.0)
MCHC: 36.3 g/dL — ABNORMAL HIGH (ref 30.0–36.0)
MCV: 84.9 fL (ref 80.0–100.0)
Monocytes Absolute: 0.6 10*3/uL (ref 0.1–1.0)
Monocytes Relative: 10 %
Neutro Abs: 4.2 10*3/uL (ref 1.7–7.7)
Neutrophils Relative %: 70 %
Platelets: 196 10*3/uL (ref 150–400)
RBC: 3.9 MIL/uL — ABNORMAL LOW (ref 4.22–5.81)
RDW: 14.6 % (ref 11.5–15.5)
WBC: 5.9 10*3/uL (ref 4.0–10.5)
nRBC: 0 % (ref 0.0–0.2)

## 2022-03-14 LAB — COMPREHENSIVE METABOLIC PANEL
ALT: 90 U/L — ABNORMAL HIGH (ref 0–44)
AST: 84 U/L — ABNORMAL HIGH (ref 15–41)
Albumin: 4.4 g/dL (ref 3.5–5.0)
Alkaline Phosphatase: 45 U/L (ref 38–126)
Anion gap: 7 (ref 5–15)
BUN: <5 mg/dL — ABNORMAL LOW (ref 6–20)
CO2: 27 mmol/L (ref 22–32)
Calcium: 9.3 mg/dL (ref 8.9–10.3)
Chloride: 100 mmol/L (ref 98–111)
Creatinine, Ser: 0.67 mg/dL (ref 0.61–1.24)
GFR, Estimated: >60 mL/min (ref >60)
Glucose, Bld: 85 mg/dL (ref 70–99)
Potassium: 4.2 mmol/L (ref 3.5–5.1)
Sodium: 134 mmol/L — ABNORMAL LOW (ref 135–145)
Total Bilirubin: 0.6 mg/dL (ref 0.3–1.2)
Total Protein: 7.8 g/dL (ref 6.5–8.1)

## 2022-03-14 LAB — LACTATE DEHYDROGENASE: LDH: 108 U/L (ref 98–192)

## 2022-03-14 LAB — SEDIMENTATION RATE: Sed Rate: 4 mm/hr (ref 0–15)

## 2022-03-14 NOTE — Progress Notes (Signed)
Hematology/Oncology Progress note [phone] [phone]      Patient Care Team: Patient, No Pcp Per as PCP - General (General Practice)  REFERRING PROVIDER: No ref. provider found  CHIEF COMPLAINTS/REASON FOR VISIT:  Follow up for hodgkin's lymphoma.   HISTORY OF PRESENTING ILLNESS:   Preston [name] is a  36 y.o.  male with PMH listed below was seen in consultation at the request of  No ref. provider found  for evaluation of Hodgkin's lymphoma.  Patient has a known diagnosis of Hodgkin's lymphoma and wants to transfer his oncology care to our cancer center.  Extensive medical records reviewed was performed  Previous oncology care was [facility] Per note  10/28/2019 biopsy of right chest wall mass, fibromuscular soft tissue with atypical infiltrate consistent with Hodgkin lymphoma, classical type. There were scattered single atypical cells with morphology suggestive of Jaclynn Guarneri variant cells. These cells were reactive with CD 45, CD 15, CD 30 and nonreactive for pan keratin, cam 5.7, CD3, CD5, CD10, CD 20, melan-A, Sox 10. Ki-67 stained majority of Reed-Sternberg like cells.  Noticed right upper chest wall lump in January 2021. He was incarcerated for 27 months, released in May 2021.  Unintentional weight loss about 30 pounds, drenching night sweats.  01/17/2020 PET scan showed bulky anterior mediastinal mass 12.7cm. with extensive adenopathy in neck, right axilla, mediastinum, bilateral hilar regions, upper abdominal ligament adenopathy and retroperitoneal adenopathy. Abnormal hypermetabolic activity within a splenic focus. Mild patchy ground glass opacity in the right lower lobe may reflect postobstructive inflammatory change without hypermetabolic activity.   01/17/2020 Echo 1. Normal biventricular systolic function and size. LVEF 60%. Average global longitudinal strain is normal, -20%   2. No significant valvular dysfunction.   3.  Normal right-and left-sided filling pressures.  4. No prior study for comparison.  12/30/2019 LDH 588 01/24/2020 CRP 67.2, ESR 60  Stage IIIB Hodgkin's lymphoma, he was recommended ABVD x 2 cycles, followed by PET with plan of AVD x 4 or escalate to BEACOPP if not favorable results.   01/26/2020 ABVD Day 1,15 x 2 cycles.   03/24/2020 PET scan showed significant interval response to therapy. All PET positive lesions previously resolved 03/30/2020 AVD D1 04/13/2020 AVD D15  # He was not able to have chest medi port placed due to chest mass. He has central line placed on left upper extremity. #Central line access.  Patient has seen Dr. Lucky Cowboy for evaluation of the left upper extremity central line.  I discussed with Dr. Lucky Cowboy and tip is in good position in the SVC.  Okay to use from vascular surgeon's aspect.  [name] pathology consultation on biopsy specimen from 10/28/2019 showed findings compatible with classic Hodgkin's lymphoma.  # 06/26/2020 - 11/29/20201 - Cycle 4 AVD  He no showed for his chemotherapy appointment in mid December 2021.    08/07/2020 seen at that time he reports acute onset of chest pain, left shoulder, back and left arm. He was sent to emergency room to have a CT scan done to rule out acute pulmonary embolism 08/07/2020, CT chest angiogram showed no pulmonary emboli or acute chest vascular pathology. Superior mediastinal lymphadenopathy consistent with clinical history of lymphoma.  Largest node measures 8.3 x 8.3 x 3.8 cm.  Several other smaller but pathologic lymph nodes in the anterior mediastinum, second largest at the aortopulmonary window measuring 1.3 x 1.9 x 3.1 cm.  No pulmonary mass or nodule.  Patient also had negative troponin,Patient was discharged home for  further follow-up with cancer center.  ER physician provided a prescription of Percocet 5/325 mg 1 tablet every 4 hours as needed for severe pain.  I obtained additional work-up for work-up to see if there is lymphoma  progression.  08/23/2020 PET scan showed dominant right paramidline anterior mediastinal mass lesion is stable in size comparing to her outside PET scan on 03/24/2020.  Lesion showed low-level FDG uptake [place] 3, not appreciably changed in the interval.  Mildly enlarged hepatoduodenal ligament lymph node measures minimally smaller on CT imaging today with [place] 2 uptake.  No additional area of unexpected or suspicious hypermetabolic zone.  No other findings of lymphadenopathy on noncontrast CT image today.  Patient was recommended to proceed with 2D echocardiogram and he had Echo done on 09/08/2020. The delay was due to him not showing up to appointment.  #09/08/2020  2D echocardiogram showed low normal LVEF 50% to 55%.  This is slightly decreased from his outside echocardiogram in June 2021 with LVEF of 60%.  09/01/2020- 09/15/2020 Cycle 5 AVD 2/222022 Cycle 6 D1 AVD  Neck shoulder pain worsen.   #10/18/2020  MRI cervical spine showed bulky degenerative disc extrusion at C5-C6 with severe spinal stenosis, spinal cord mass-effect and abnormal cord signal (edema and/or myelomalacia). Moderate to severe multifactorial right C6 foraminal stenosis. Smaller disc herniation at C3-C4, and lobulated disc bulging at C4-C5 with up to mild spinal stenosis at those levels. Up to moderate degenerative right C4, left C7, and moderate to severe bilateral C8 neural foraminal stenosis. 10/20/20 Patient underwent  C5-6 anterior cervical discectomy and fusion    11/10/2020 he was seen in the clinic prior to chemotherapy. At that time, he felt weak and has had weight loss due to his neck surgery. Therefore decision was made to hold chemo and give him 1-2 weeks for recovery.  Then he had multiple no show for chemotherapy treatment appointment.   # 5/26/.2022 cycle 6 D15 AVD.  #09/10/2021 CT chest abdomen pelvis w contrast showed stable mediastinal adenopathy, no progressive findings.  Stable low-attenuation celiac axis  node.  No mesenteric or retroperitoneal or pelvic adenopathy.  Stable diffuse fatty infiltration of the liver.  Stable renal cyst.  Age advanced coronary artery calcifications.    INTERVAL HISTORY Preston [name] is a 36 y.o. male who has above history reviewed by me today presents for follow up visit for management of Hodgkin's  Lymphoma Patient denies constitutional symptoms.  He did not go to follow-up with Dr. Lucky Cowboy for port removal.  He has good appetite. He has lost weight, no night sweating.  Denies any back pain, fever chills.   Review of Systems  Constitutional:  Positive for unexpected weight change. Negative for appetite change, chills, fatigue and fever.  HENT:   Negative for hearing loss and voice change.   Eyes:  Negative for eye problems and icterus.  Respiratory:  Negative for chest tightness, cough and shortness of breath.   Cardiovascular:  Negative for leg swelling.  Gastrointestinal:  Negative for abdominal distention, abdominal pain and nausea.  Endocrine: Negative for hot flashes.  Genitourinary:  Negative for difficulty urinating, dysuria and frequency.   Musculoskeletal:  Negative for arthralgias, back pain and neck pain.  Skin:  Negative for itching and rash.  Neurological:  Negative for light-headedness and numbness.  Hematological:  Negative for adenopathy. Does not bruise/bleed easily.  Psychiatric/Behavioral:  Negative for confusion.     MEDICAL HISTORY:  Past Medical History:  Diagnosis Date   Hodgkin's lymphoma ([place])  Tobacco use     SURGICAL HISTORY: Past Surgical History:  Procedure Laterality Date   ANTERIOR CERVICAL DECOMP/DISCECTOMY FUSION N/A 10/20/2020   Procedure: ANTERIOR CERVICAL DECOMPRESSION/DISCECTOMY FUSION 1 LEVEL C5-C6;  Surgeon: Meade Maw, MD;  Location: [facility] ORS;  Service: Neurosurgery;  Laterality: N/A;    SOCIAL HISTORY: Social History   Socioeconomic History   Marital status: Married    Spouse name: Not on  file   Number of children: Not on file   Years of education: Not on file   Highest education level: Not on file  Occupational History   Not on file  Tobacco Use   Smoking status: Every Day    Packs/day: 0.50    Years: 20.00    Total pack years: 10.00    Types: Cigarettes   Smokeless tobacco: Never  Vaping Use   Vaping Use: Never used  Substance and Sexual Activity   Alcohol use: Yes    Comment: occasional    Drug use: Not Currently   Sexual activity: Not on file  Other Topics Concern   Not on file  Social History Narrative   Not on file   Social Determinants of Health   Financial Resource Strain: Not on file  Food Insecurity: Not on file  Transportation Needs: Not on file  Physical Activity: Not on file  Stress: Not on file  Social Connections: Not on file  Intimate Partner Violence: Not on file    FAMILY HISTORY: Family History  Problem Relation Age of Onset   Heart failure Mother    Pneumonia Father     ALLERGIES:  has No Known Allergies.  MEDICATIONS:  Current Outpatient Medications  Medication Sig Dispense Refill   acetaminophen (TYLENOL) 500 MG tablet Take 1,000 mg by mouth every 6 (six) hours as needed for mild pain or moderate pain.     ibuprofen (ADVIL) 200 MG tablet Take 600-800 mg by mouth every 6 (six) hours as needed for mild pain or moderate pain.     dexamethasone (DECADRON) 4 MG tablet Take 2 tablets (8 mg total) by mouth daily. (Patient not taking: Reported on 01/01/2021) 6 tablet 0   methocarbamol (ROBAXIN) 500 MG tablet Take 1 tablet (500 mg total) by mouth every 6 (six) hours as needed for muscle spasms. (Patient not taking: Reported on 09/12/2021) 21 tablet 0   omeprazole (PRILOSEC) 20 MG capsule Take 1 capsule (20 mg total) by mouth daily. (Patient not taking: Reported on 11/10/2020) 30 capsule 0   ondansetron (ZOFRAN) 8 MG tablet Take 1 tablet (8 mg total) by mouth 2 (two) times daily. (Patient not taking: Reported on 09/12/2021) 60 tablet 1    oxyCODONE-acetaminophen (PERCOCET) 5-325 MG tablet Take 1-2 tablets by mouth every 4 (four) hours as needed for moderate pain or severe pain. (Patient not taking: Reported on 11/10/2020) 30 tablet 0   prochlorperazine (COMPAZINE) 10 MG tablet Take 1 tablet (10 mg total) by mouth every 6 (six) hours as needed for nausea or vomiting. (Patient not taking: Reported on 09/12/2021) 60 tablet 1   No current facility-administered medications for this visit.   Facility-Administered Medications Ordered in Other Visits  Medication Dose Route Frequency Provider Last Rate Last Admin   heparin lock flush 100 unit/mL  500 Units Intravenous Once Earlie Server, MD       sodium chloride flush (NS) 0.9 % injection 10 mL  10 mL Intravenous PRN Earlie Server, MD   10 mL at 08/07/20 0826     PHYSICAL EXAMINATION:  ECOG PERFORMANCE STATUS: 0 - Asymptomatic Vitals:   03/14/22 1340  BP: (!) 143/96  Pulse: 72  Resp: 18  Temp: 99.4 F (37.4 C)   Filed Weights   03/14/22 1340  Weight: 149 lb 4.8 oz (67.7 kg)    Physical Exam Constitutional:      General: He is not in acute distress. HENT:     Head: Normocephalic and atraumatic.  Eyes:     General: No scleral icterus. Cardiovascular:     Rate and Rhythm: Normal rate and regular rhythm.     Heart sounds: Normal heart sounds.  Pulmonary:     Effort: Pulmonary effort is normal. No respiratory distress.     Breath sounds: No wheezing.  Abdominal:     General: Bowel sounds are normal. There is no distension.     Palpations: Abdomen is soft.  Musculoskeletal:        General: No deformity. Normal range of motion.     Cervical back: Normal range of motion and neck supple.  Skin:    General: Skin is warm and dry.     Findings: No erythema or rash.  Neurological:     Mental Status: He is alert and oriented to person, place, and time. Mental status is at baseline.     Cranial Nerves: No cranial nerve deficit.     Coordination: Coordination normal.  Psychiatric:         Mood and Affect: Mood normal.   left upper arm central line access     LABORATORY DATA:  I have reviewed the data as listed    Latest Ref Rng & Units 03/14/2022    1:29 PM 08/31/2021   10:05 AM 03/09/2021    9:49 AM  CBC  WBC 4.0 - 10.5 K/uL 5.9  4.2  4.6   Hemoglobin 13.0 - 17.0 g/dL 12.0  13.7  13.2   Hematocrit 39.0 - 52.0 % 33.1  36.5  35.6   Platelets 150 - 400 K/uL 196  255  223       Latest Ref Rng & Units 03/14/2022    1:29 PM 03/12/2022    9:07 AM 08/31/2021   10:05 AM  CMP  Glucose 70 - 99 mg/dL 85   104   BUN 6 - 20 mg/dL <5   12   Creatinine 0.61 - 1.24 mg/dL 0.67  0.60  0.71   Sodium 135 - 145 mmol/L 134   134   Potassium 3.5 - 5.1 mmol/L 4.2   4.0   Chloride 98 - 111 mmol/L 100   100   CO2 22 - 32 mmol/L 27   25   Calcium 8.9 - 10.3 mg/dL 9.3   9.3   Total Protein 6.5 - 8.1 g/dL 7.8   7.5   Total Bilirubin 0.3 - 1.2 mg/dL 0.6   1.5   Alkaline Phos 38 - 126 U/L 45   48   AST 15 - 41 U/L 84   56   ALT 0 - 44 U/L 90   48      RADIOGRAPHIC STUDIES: I have personally reviewed the radiological images as listed and agreed with the findings in the report. CT CHEST ABDOMEN PELVIS W CONTRAST  Result Date: 03/12/2022 CLINICAL DATA:  Hodgkin's lymphoma, follow-up. * Tracking Code: BO * EXAM: CT CHEST, ABDOMEN, AND PELVIS WITH CONTRAST TECHNIQUE: Multidetector CT imaging of the chest, abdomen and pelvis was performed following the standard protocol during bolus administration of intravenous contrast. RADIATION DOSE  REDUCTION: This exam was performed according to the departmental dose-optimization program which includes automated exposure control, adjustment of the mA and/or kV according to patient size and/or use of iterative reconstruction technique. CONTRAST:  180m OMNIPAQUE IOHEXOL 300 MG/ML  SOLN COMPARISON:  Multiple priors including most recent CT chest abdomen and pelvis dated September 10, 2021 FINDINGS: CT CHEST FINDINGS Cardiovascular: Left upper extremity PICC with tip  in the right atrium. Normal caliber thoracic aorta. No central pulmonary embolus on this nondedicated study. Coronary artery calcifications. Normal size heart. No significant pericardial effusion/thickening. Mediastinum/Nodes: Slight interval increase in size in the anterior mediastinal mass which now measures 9.4 x 7.3 x 4.1 cm on images 79/6 and 28/2 previously measuring 9.3 x by 6.7 x 4.1 cm when remeasured for consistency. Adjacent prevascular lymph nodes are stable in size measuring up to 9 mm in short axis on image 24/2 unchanged when remeasured for consistency. No axillary or hilar adenopathy. Esophagus is grossly unremarkable. Lungs/Pleura: No suspicious pulmonary nodules or masses. No focal airspace consolidation. No pleural effusion. No pneumothorax. Musculoskeletal: No suspicious chest wall mass. Partially visualized anterior cervical fusion hardware. No aggressive lytic or blastic lesion of bone. CT ABDOMEN PELVIS FINDINGS Hepatobiliary: Stable diffuse hepatic steatosis with more prominent focus of fatty infiltration along the posterior left lobe of the liver. No suspicious hepatic lesion. Gallbladder is unremarkable. No biliary ductal dilation. Pancreas: No pancreatic ductal dilation or evidence of acute inflammation. Spleen: No splenomegaly or focal splenic lesion. Adrenals/Urinary Tract: Bilateral adrenal glands appear normal. Hydronephrosis. Kidneys demonstrate symmetric enhancement. Urinary bladder is unremarkable for degree of distension. Stomach/Bowel: Stomach is within normal limits. Appendix appears normal. No evidence of bowel wall thickening, distention, or inflammatory changes. Vascular/Lymphatic: Aortic atherosclerosis. The portal, splenic and superior mesenteric veins are patent. Stable low-attenuation celiac axis node measuring 13 mm in short axis on image 65/2, unchanged when remeasured for consistency. No new or progressive lymphadenopathy in the abdomen or pelvis. Reproductive: Prostate  is unremarkable. Other: No significant abdominopelvic free fluid. Musculoskeletal: No aggressive lytic or blastic lesion of bone. IMPRESSION: 1. Slight interval increase in size of the dominant anterior mediastinal nodal tissue. 2. Additional anterior mediastinal lymph nodes and the low-density celiac axis lymph node are stable. 3. No new areas of adenopathy in the chest, abdomen or pelvis. 4. No splenomegaly. 5. Hepatic steatosis with more prominent focal areas of fatty infiltration in the posterior left lobe of the liver, similar prior. 6.  Aortic Atherosclerosis (ICD10-I70.0). Electronically Signed   By: JDahlia BailiffM.D.   On: 03/12/2022 13:58      ASSESSMENT & PLAN:  1. Other classical Hodgkin lymphoma, unspecified body region ([place]   2. Port-A-Cath in place   3. Weight loss   4. Transaminitis    # Hodgkin's lymphoma, classic type S/p 2 cycles of ABVD and 4 cycles of AVD.  03/12/22 CT chest abdomen pelvis w contrast showed 1. Slight interval increase in size of the dominant anterior mediastinal nodal tissue. 2. Additional anterior mediastinal lymph nodes and the low-density celiac axis lymph node are stable. 3. No new areas of adenopathy in the chest, abdomen or pelvis.  He has had unintentional weight loss. Recommend PET for evaluation.   # Transaminitis, likely due to fatty liver disease. Recommend to avoid alcohol  #radiculopathy due to cervical stenosis status post surgical intervention. Improved symptoms.   # Porta Cath in place.  left antecubital fossa port, he has not flushed as instructed.  I previous discussed with vascular surgeon  Dr.Dew.  Plan is to remove this port as it is associated with high risk of occlusion/thrombosis. However patient did not follow up with vascular surgery.  Schedule port flush.   All questions were answered. The patient knows to call the clinic with any problems questions or concerns.  Return of visit: 4 months. Lab - cbc,cmp, crp, esr, ldh   MD  Earlie Server, MD, PhD  03/14/2022

## 2022-03-14 NOTE — Progress Notes (Signed)
Patient here for follow up. No new concerns voiced.  °

## 2022-03-27 ENCOUNTER — Ambulatory Visit: Admission: RE | Admit: 2022-03-27 | Payer: Medicaid Other | Source: Ambulatory Visit

## 2022-03-28 ENCOUNTER — Ambulatory Visit: Admission: RE | Admit: 2022-03-28 | Payer: Medicaid Other | Source: Ambulatory Visit

## 2022-03-29 ENCOUNTER — Telehealth: Payer: Self-pay

## 2022-03-29 NOTE — Telephone Encounter (Signed)
Received message from PET De/partment that pt did not show to PET scan on 8/17. Called pt and got VM for Erica (S/O). Left VM with PET department ph # so they can r/s scan per their availability.

## 2022-04-05 ENCOUNTER — Inpatient Hospital Stay: Payer: Self-pay

## 2022-05-31 ENCOUNTER — Inpatient Hospital Stay: Payer: Self-pay | Attending: Oncology

## 2022-07-15 ENCOUNTER — Telehealth: Payer: Self-pay | Admitting: *Deleted

## 2022-07-15 ENCOUNTER — Inpatient Hospital Stay: Payer: Medicaid Other | Attending: Oncology

## 2022-07-15 ENCOUNTER — Inpatient Hospital Stay (HOSPITAL_BASED_OUTPATIENT_CLINIC_OR_DEPARTMENT_OTHER): Payer: Medicaid Other | Admitting: Oncology

## 2022-07-15 ENCOUNTER — Encounter: Payer: Self-pay | Admitting: Oncology

## 2022-07-15 VITALS — BP 145/104 | HR 107 | Temp 99.7°F | Resp 18 | Ht 70.0 in | Wt 140.0 lb

## 2022-07-15 DIAGNOSIS — M549 Dorsalgia, unspecified: Secondary | ICD-10-CM | POA: Diagnosis not present

## 2022-07-15 DIAGNOSIS — R7401 Elevation of levels of liver transaminase levels: Secondary | ICD-10-CM | POA: Insufficient documentation

## 2022-07-15 DIAGNOSIS — C8170 Other classical Hodgkin lymphoma, unspecified site: Secondary | ICD-10-CM

## 2022-07-15 DIAGNOSIS — Z95828 Presence of other vascular implants and grafts: Secondary | ICD-10-CM | POA: Insufficient documentation

## 2022-07-15 DIAGNOSIS — M4802 Spinal stenosis, cervical region: Secondary | ICD-10-CM

## 2022-07-15 DIAGNOSIS — C8192 Hodgkin lymphoma, unspecified, intrathoracic lymph nodes: Secondary | ICD-10-CM | POA: Insufficient documentation

## 2022-07-15 DIAGNOSIS — M545 Low back pain, unspecified: Secondary | ICD-10-CM

## 2022-07-15 DIAGNOSIS — F17210 Nicotine dependence, cigarettes, uncomplicated: Secondary | ICD-10-CM | POA: Insufficient documentation

## 2022-07-15 LAB — CBC WITH DIFFERENTIAL/PLATELET
Abs Immature Granulocytes: 0.02 10*3/uL (ref 0.00–0.07)
Basophils Absolute: 0 10*3/uL (ref 0.0–0.1)
Basophils Relative: 0 %
Eosinophils Absolute: 0 10*3/uL (ref 0.0–0.5)
Eosinophils Relative: 0 %
HCT: 34.6 % — ABNORMAL LOW (ref 39.0–52.0)
Hemoglobin: 12.4 g/dL — ABNORMAL LOW (ref 13.0–17.0)
Immature Granulocytes: 0 %
Lymphocytes Relative: 22 %
Lymphs Abs: 1.2 10*3/uL (ref 0.7–4.0)
MCH: 27.8 pg (ref 26.0–34.0)
MCHC: 35.8 g/dL (ref 30.0–36.0)
MCV: 77.6 fL — ABNORMAL LOW (ref 80.0–100.0)
Monocytes Absolute: 0.8 10*3/uL (ref 0.1–1.0)
Monocytes Relative: 14 %
Neutro Abs: 3.7 10*3/uL (ref 1.7–7.7)
Neutrophils Relative %: 64 %
Platelets: 290 10*3/uL (ref 150–400)
RBC: 4.46 MIL/uL (ref 4.22–5.81)
RDW: 15.9 % — ABNORMAL HIGH (ref 11.5–15.5)
WBC: 5.7 10*3/uL (ref 4.0–10.5)
nRBC: 0 % (ref 0.0–0.2)

## 2022-07-15 LAB — COMPREHENSIVE METABOLIC PANEL
ALT: 78 U/L — ABNORMAL HIGH (ref 0–44)
AST: 111 U/L — ABNORMAL HIGH (ref 15–41)
Albumin: 4.6 g/dL (ref 3.5–5.0)
Alkaline Phosphatase: 58 U/L (ref 38–126)
Anion gap: 14 (ref 5–15)
BUN: 7 mg/dL (ref 6–20)
CO2: 23 mmol/L (ref 22–32)
Calcium: 9.5 mg/dL (ref 8.9–10.3)
Chloride: 100 mmol/L (ref 98–111)
Creatinine, Ser: 0.63 mg/dL (ref 0.61–1.24)
GFR, Estimated: >60 mL/min (ref >60)
Glucose, Bld: 96 mg/dL (ref 70–99)
Potassium: 4.1 mmol/L (ref 3.5–5.1)
Sodium: 137 mmol/L (ref 135–145)
Total Bilirubin: 1.6 mg/dL — ABNORMAL HIGH (ref 0.3–1.2)
Total Protein: 8.6 g/dL — ABNORMAL HIGH (ref 6.5–8.1)

## 2022-07-15 LAB — LACTATE DEHYDROGENASE: LDH: 129 U/L (ref 98–192)

## 2022-07-15 LAB — SEDIMENTATION RATE: Sed Rate: 13 mm/hr (ref 0–15)

## 2022-07-15 MED ORDER — SODIUM CHLORIDE 0.9% FLUSH
10.0000 mL | Freq: Once | INTRAVENOUS | Status: AC
  Administered 2022-07-15: 10 mL via INTRAVENOUS
  Filled 2022-07-15: qty 10

## 2022-07-15 MED ORDER — DEXAMETHASONE 4 MG PO TABS: 4.0000 mg | ORAL_TABLET | Freq: Every day | ORAL | 0 refills | Status: DC

## 2022-07-15 MED ORDER — OXYCODONE-ACETAMINOPHEN 5-325 MG PO TABS
1.0000 | ORAL_TABLET | ORAL | 0 refills | Status: DC | PRN
Start: 2022-07-15 — End: 2022-12-29

## 2022-07-15 MED ORDER — TRAMADOL HCL 50 MG PO TABS: 100.0000 mg | ORAL_TABLET | Freq: Three times a day (TID) | ORAL | 0 refills | Status: DC | PRN

## 2022-07-15 MED ORDER — HEPARIN SOD (PORK) LOCK FLUSH 100 UNIT/ML IV SOLN
500.0000 [IU] | Freq: Once | INTRAVENOUS | Status: AC
  Administered 2022-07-15: 500 [IU] via INTRAVENOUS
  Filled 2022-07-15: qty 5

## 2022-07-15 NOTE — Progress Notes (Signed)
Hematology/Oncology Progress note [phone] [phone]      Patient Care Team: Patient, No Pcp Per as PCP - General (General Practice)  ASSESSMENT & PLAN:   Malignant lymphoma, Hodgkin's type ([employer]) # Hodgkin's lymphoma, classic type S/p 2 cycles of ABVD and 4 cycles of AVD.  Repeat PET scan for restaging- insurance denied PET. CT imaged were reviewed.  Similar size of the anterior mediastinal mass/lymph node conglomerate with slight increase in size of the adjacent prevascular lymph nodes. I recommend close monitor and repeat CT in 2 months.    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  Back pain Not improved with Tylenol.  Pending image evaluation.  Recommend Tramadol 50-134m Q8h PRN.   Port-A-Cath in place left antecubital fossa port, he has not flushed as instructed.  I previous discussed with vascular surgeon Dr.Dew.  Plan is to remove this port as it is associated with high risk of occlusion/thrombosis. However patient did not follow up with vascular surgery.  He has port flush today.   Transaminitis Recommend patient to decrease alcohol intake.    No orders of the defined types were placed in this encounter.  Follow up TBD  All questions were answered. The patient knows to call the clinic with any problems, questions or concerns.  ZEarlie Server MD, PhD [facility] 07/15/2022   CHIEF COMPLAINTS/REASON FOR VISIT:  Follow up for hodgkin's lymphoma.   HISTORY OF PRESENTING ILLNESS:   Preston [name] a  36y.o.  male with PMH listed below was seen in consultation at the request of  No ref. provider found  for evaluation of Hodgkin's lymphoma.  Patient has a known diagnosis of Hodgkin's lymphoma and wants to transfer his oncology care to our cancer center.  Extensive medical records reviewed was performed  [facility]  10/28/2019 biopsy of right chest wall mass, fibromuscular soft tissue with atypical infiltrate consistent with Hodgkin lymphoma, classical type. There were scattered single atypical cells with morphology suggestive of RJaclynn Guarnerivariant cells. These cells were reactive with CD 45, CD 15, CD 30 and nonreactive for pan keratin, cam 5.7, CD3, CD5, CD10, CD 20, melan-A, Sox 10. Ki-67 stained majority of Reed-Sternberg like cells.  Noticed right upper chest wall lump in January 2021. He was incarcerated for 27 months, released in May 2021.  Unintentional weight loss about 30 pounds, drenching night sweats.  01/17/2020 PET scan showed bulky anterior mediastinal mass 12.7cm. with extensive adenopathy in neck, right axilla, mediastinum, bilateral hilar regions, upper abdominal ligament adenopathy and retroperitoneal adenopathy. Abnormal hypermetabolic activity within a splenic focus. Mild patchy ground glass opacity in the right lower lobe may reflect postobstructive inflammatory change without hypermetabolic activity.   01/17/2020 Echo 1. Normal biventricular systolic function and size. LVEF 60%. Average global longitudinal strain is normal, -20%   2. No significant valvular dysfunction.   3. Normal right-and left-sided filling pressures.  4. No prior study for comparison.  12/30/2019 LDH 588 01/24/2020 CRP 67.2, ESR 60  Stage IIIB Hodgkin's lymphoma, he was recommended ABVD x 2 cycles, followed by PET with plan of AVD x 4 or escalate to BEACOPP if not favorable results.   01/26/2020 ABVD Day 1,15 x 2 cycles.   03/24/2020 PET scan showed significant interval response to therapy. All PET positive lesions previously resolved 03/30/2020 AVD D1 04/13/2020 AVD D15  # He was not able to have  chest medi port placed due to chest mass. He has central line placed on left upper extremity. #Central line access.  Patient has seen Dr. Lucky Cowboy for evaluation of the left upper extremity central  line.  I discussed with Dr. Lucky Cowboy and tip is in good position in the SVC.  Okay to use from vascular surgeon's aspect.  [facility] pathology consultation on biopsy specimen from 10/28/2019 showed findings compatible with classic Hodgkin's lymphoma.  # 06/26/2020 - 11/29/20201 - Cycle 4 AVD  He no showed for his chemotherapy appointment in mid December 2021.    08/07/2020 seen at that time he reports acute onset of chest pain, left shoulder, back and left arm. He was sent to emergency room to have a CT scan done to rule out acute pulmonary embolism 08/07/2020, CT chest angiogram showed no pulmonary emboli or acute chest vascular pathology. Superior mediastinal lymphadenopathy consistent with clinical history of lymphoma.  Largest node measures 8.3 x 8.3 x 3.8 cm.  Several other smaller but pathologic lymph nodes in the anterior mediastinum, second largest at the aortopulmonary window measuring 1.3 x 1.9 x 3.1 cm.  No pulmonary mass or nodule.  Patient also had negative troponin,Patient was discharged home for further follow-up with cancer center.  ER physician provided a prescription of Percocet 5/325 mg 1 tablet every 4 hours as needed for severe pain.  I obtained additional work-up for work-up to see if there is lymphoma progression.  08/23/2020 PET scan showed dominant right paramidline anterior mediastinal mass lesion is stable in size comparing to her outside PET scan on 03/24/2020.  Lesion showed low-level FDG uptake [place] 3, not appreciably changed in the interval.  Mildly enlarged hepatoduodenal ligament lymph node measures minimally smaller on CT imaging today with [place] 2 uptake.  No additional area of unexpected or suspicious hypermetabolic zone.  No other findings of lymphadenopathy on noncontrast CT image today.  Patient was recommended to proceed with 2D echocardiogram and he had Echo done on 09/08/2020. The delay was due to him not showing up to appointment.  #09/08/2020  2D echocardiogram  showed low normal LVEF 50% to 55%.  This is slightly decreased from his outside echocardiogram in June 2021 with LVEF of 60%.  09/01/2020- 09/15/2020 Cycle 5 AVD 2/222022 Cycle 6 D1 AVD  Neck shoulder pain worsen.   #10/18/2020  MRI cervical spine showed bulky degenerative disc extrusion at C5-C6 with severe spinal stenosis, spinal cord mass-effect and abnormal cord signal (edema and/or myelomalacia). Moderate to severe multifactorial right C6 foraminal stenosis. Smaller disc herniation at C3-C4, and lobulated disc bulging at C4-C5 with up to mild spinal stenosis at those levels. Up to moderate degenerative right C4, left C7, and moderate to severe bilateral C8 neural foraminal stenosis. 10/20/20 Patient underwent  C5-6 anterior cervical discectomy and fusion    11/10/2020 he was seen in the clinic prior to chemotherapy. At that time, he felt weak and has had weight loss due to his neck surgery. Therefore decision was made to hold chemo and give him 1-2 weeks for recovery.  Then he had multiple no show for chemotherapy treatment appointment.   # 5/26/.2022 cycle 6 D15 AVD.  #09/10/2021 CT chest abdomen pelvis w contrast showed stable mediastinal adenopathy, no progressive findings.  Stable low-attenuation celiac axis node.  No mesenteric or retroperitoneal or pelvic adenopathy.  Stable diffuse fatty infiltration of the liver.  Stable renal cyst.  Age advanced coronary artery calcifications.  # 03/12/22 CT chest abdomen pelvis w contrast showed 1. Slight  interval increase in size of the dominant anterior mediastinal nodal tissue. 2. Additional anterior mediastinal lymph nodes and the low-density celiac axis lymph node are stable. 3. No new areas of adenopathy in the chest, abdomen or pelvis.  INTERVAL HISTORY Andris Brothers is a 36 y.o. male who has above history reviewed by me today presents for follow up visit for management of Hodgkin's  Lymphoma He has decreased appetite, lost 9 pounds of weight  since 3 months ago.  New onset of back pain, not improved with Tylenol.  PET was rescheduled to 07/18/22  Review of Systems  Constitutional:  Positive for unexpected weight change. Negative for appetite change, chills, fatigue and fever.  HENT:   Negative for hearing loss and voice change.   Eyes:  Negative for eye problems and icterus.  Respiratory:  Negative for chest tightness, cough and shortness of breath.   Cardiovascular:  Negative for leg swelling.  Gastrointestinal:  Negative for abdominal distention, abdominal pain and nausea.  Endocrine: Negative for hot flashes.  Genitourinary:  Negative for difficulty urinating, dysuria and frequency.   Musculoskeletal:  Negative for arthralgias, back pain and neck pain.  Skin:  Negative for itching and rash.  Neurological:  Negative for light-headedness and numbness.  Hematological:  Negative for adenopathy. Does not bruise/bleed easily.  Psychiatric/Behavioral:  Negative for confusion.     MEDICAL HISTORY:  Past Medical History:  Diagnosis Date   Hodgkin's lymphoma ([place])    Tobacco use     SURGICAL HISTORY: Past Surgical History:  Procedure Laterality Date   ANTERIOR CERVICAL DECOMP/DISCECTOMY FUSION N/A 10/20/2020   Procedure: ANTERIOR CERVICAL DECOMPRESSION/DISCECTOMY FUSION 1 LEVEL C5-C6;  Surgeon: Meade Maw, MD;  Location: [facility] ORS;  Service: Neurosurgery;  Laterality: N/A;    SOCIAL HISTORY: Social History   Socioeconomic History   Marital status: Married    Spouse name: Not on file   Number of children: Not on file   Years of education: Not on file   Highest education level: Not on file  Occupational History   Not on file  Tobacco Use   Smoking status: Every Day    Packs/day: 0.50    Years: 20.00    Total pack years: 10.00    Types: Cigarettes   Smokeless tobacco: Never  Vaping Use   Vaping Use: Never used  Substance and Sexual Activity   Alcohol use: Yes    Comment: occasional    Drug use: Not  Currently   Sexual activity: Not on file  Other Topics Concern   Not on file  Social History Narrative   Not on file   Social Determinants of Health   Financial Resource Strain: Not on file  Food Insecurity: Not on file  Transportation Needs: Not on file  Physical Activity: Not on file  Stress: Not on file  Social Connections: Not on file  Intimate Partner Violence: Not on file    FAMILY HISTORY: Family History  Problem Relation Age of Onset   Heart failure Mother    Pneumonia Father     ALLERGIES:  has No Known Allergies.  MEDICATIONS:  Current Outpatient Medications  Medication Sig Dispense Refill   acetaminophen (TYLENOL) 500 MG tablet Take 1,000 mg by mouth every 6 (six) hours as needed for mild pain or moderate pain.     ibuprofen (ADVIL) 200 MG tablet Take 600-800 mg by mouth every 6 (six) hours as needed for mild pain or moderate pain.     traMADol (ULTRAM) 50 MG tablet  Take 2 tablets (100 mg total) by mouth every 8 (eight) hours as needed. 45 tablet 0   dexamethasone (DECADRON) 4 MG tablet Take 1 tablet (4 mg total) by mouth daily. 5 tablet 0   methocarbamol (ROBAXIN) 500 MG tablet Take 1 tablet (500 mg total) by mouth every 6 (six) hours as needed for muscle spasms. (Patient not taking: Reported on 09/12/2021) 21 tablet 0   omeprazole (PRILOSEC) 20 MG capsule Take 1 capsule (20 mg total) by mouth daily. (Patient not taking: Reported on 11/10/2020) 30 capsule 0   ondansetron (ZOFRAN) 8 MG tablet Take 1 tablet (8 mg total) by mouth 2 (two) times daily. (Patient not taking: Reported on 09/12/2021) 60 tablet 1   oxyCODONE-acetaminophen (PERCOCET) 5-325 MG tablet Take 1-2 tablets by mouth every 4 (four) hours as needed for moderate pain or severe pain. 30 tablet 0   prochlorperazine (COMPAZINE) 10 MG tablet Take 1 tablet (10 mg total) by mouth every 6 (six) hours as needed for nausea or vomiting. (Patient not taking: Reported on 09/12/2021) 60 tablet 1   No current  facility-administered medications for this visit.   Facility-Administered Medications Ordered in Other Visits  Medication Dose Route Frequency Provider Last Rate Last Admin   heparin lock flush 100 unit/mL  500 Units Intravenous Once Earlie Server, MD       sodium chloride flush (NS) 0.9 % injection 10 mL  10 mL Intravenous PRN Earlie Server, MD   10 mL at 08/07/20 0826     PHYSICAL EXAMINATION: ECOG PERFORMANCE STATUS: 0 - Asymptomatic Vitals:   07/15/22 1336  BP: (!) 145/104  Pulse: (!) 107  Resp: 18  Temp: 99.7 F (37.6 C)  SpO2: 99%   Filed Weights   07/15/22 1336  Weight: 140 lb (63.5 kg)    Physical Exam Constitutional:      General: He is not in acute distress. HENT:     Head: Normocephalic and atraumatic.  Eyes:     General: No scleral icterus. Cardiovascular:     Rate and Rhythm: Normal rate and regular rhythm.     Heart sounds: Normal heart sounds.  Pulmonary:     Effort: Pulmonary effort is normal. No respiratory distress.     Breath sounds: No wheezing.  Abdominal:     General: Bowel sounds are normal. There is no distension.     Palpations: Abdomen is soft.  Musculoskeletal:        General: No deformity. Normal range of motion.     Cervical back: Normal range of motion and neck supple.  Skin:    General: Skin is warm and dry.     Findings: No erythema or rash.  Neurological:     Mental Status: He is alert and oriented to person, place, and time. Mental status is at baseline.     Cranial Nerves: No cranial nerve deficit.     Coordination: Coordination normal.  Psychiatric:        Mood and Affect: Mood normal.   left upper arm central line access     LABORATORY DATA:  I have reviewed the data as listed    Latest Ref Rng & Units 07/15/2022    1:28 PM 03/14/2022    1:29 PM 08/31/2021   10:05 AM  CBC  WBC 4.0 - 10.5 K/uL 5.7  5.9  4.2   Hemoglobin 13.0 - 17.0 g/dL 12.4  12.0  13.7   Hematocrit 39.0 - 52.0 % 34.6  33.1  36.5  Platelets 150 - 400 K/uL  290  196  255       Latest Ref Rng & Units 07/15/2022    1:28 PM 03/14/2022    1:29 PM 03/12/2022    9:07 AM  CMP  Glucose 70 - 99 mg/dL 96  85    BUN 6 - 20 mg/dL 7  <5    Creatinine 0.61 - 1.24 mg/dL 0.63  0.67  0.60   Sodium 135 - 145 mmol/L 137  134    Potassium 3.5 - 5.1 mmol/L 4.1  4.2    Chloride 98 - 111 mmol/L 100  100    CO2 22 - 32 mmol/L 23  27    Calcium 8.9 - 10.3 mg/dL 9.5  9.3    Total Protein 6.5 - 8.1 g/dL 8.6  7.8    Total Bilirubin 0.3 - 1.2 mg/dL 1.6  0.6    Alkaline Phos 38 - 126 U/L 58  45    AST 15 - 41 U/L 111  84    ALT 0 - 44 U/L 78  90       RADIOGRAPHIC STUDIES: I have personally reviewed the radiological images as listed and agreed with the findings in the report. No results found.

## 2022-07-15 NOTE — Telephone Encounter (Signed)
Dr. Tasia Catchings says pt has had oxycodone in the past. He can have up to 6 tramadol tablets per day (2 pills every 8hrs prn). Ok per Dr. Tasia Catchings.

## 2022-07-15 NOTE — Assessment & Plan Note (Addendum)
#   Hodgkin's lymphoma, classic type S/p 2 cycles of ABVD and 4 cycles of AVD.  Repeat PET scan for restaging- insurance denied PET. CT imaged were reviewed.  Similar size of the anterior mediastinal mass/lymph node conglomerate with slight increase in size of the adjacent prevascular lymph nodes. I recommend close monitor and repeat CT in 2 months.

## 2022-07-15 NOTE — Telephone Encounter (Signed)
Call from [place], Clement Sayres asking to add to directions on the Tramadol prescription to not exceed 4 tablets per day since he is morphine naive. Please advise or resend prescription if agree

## 2022-07-15 NOTE — Telephone Encounter (Signed)
Call returned to pharmacy Spoke with [facility] and informed of physician response. She states that it has been since 2021 since patient had Oxycodone so they consider him naive and that it is [facility] policy to have that it be on prescription and if you do not want iot on there, prescription needs to be sent to another pharmacy

## 2022-07-15 NOTE — Assessment & Plan Note (Signed)
radiculopathy due to cervical stenosis status post surgical intervention.

## 2022-07-15 NOTE — Assessment & Plan Note (Signed)
Recommend patient to decrease alcohol intake.

## 2022-07-15 NOTE — Assessment & Plan Note (Signed)
left antecubital fossa port, he has not flushed as instructed.  I previous discussed with vascular surgeon Dr.Dew.  Plan is to remove this port as it is associated with high risk of occlusion/thrombosis. However patient did not follow up with vascular surgery.  He has port flush today.

## 2022-07-15 NOTE — Assessment & Plan Note (Signed)
Not improved with Tylenol.  Pending image evaluation.  Recommend Tramadol 50-'100mg'$  Q8h PRN.

## 2022-07-16 MED ORDER — TRAMADOL HCL 50 MG PO TABS: 50.0000 mg | ORAL_TABLET | Freq: Four times a day (QID) | ORAL | 0 refills | Status: DC | PRN

## 2022-07-16 NOTE — Telephone Encounter (Signed)
Did you want to re-route medication to [facility] [facility]. Looks like [facility] is refusing to fill with current instructions.

## 2022-07-16 NOTE — Telephone Encounter (Signed)
Dr. Tasia Catchings has updated and sent new rx.

## 2022-07-18 ENCOUNTER — Ambulatory Visit: Payer: Medicaid Other

## 2022-07-18 ENCOUNTER — Telehealth: Payer: Self-pay

## 2022-07-18 NOTE — Telephone Encounter (Signed)
Pt in clinic this morning reporting that [facility] does not have his medication. Contacted [facility] and informed them of updated Tramadol rx sent. They said they will have medication ready approx 30 minutes from call.   Pt also stated that he showed up for PET appt this morning and it was cancelled.  Infomed him that this was due to insurance auth pending and that he would be contacted once it is approved. Pt verbalized understanding.

## 2022-07-22 ENCOUNTER — Other Ambulatory Visit: Payer: Self-pay

## 2022-07-22 DIAGNOSIS — C8170 Other classical Hodgkin lymphoma, unspecified site: Secondary | ICD-10-CM

## 2022-07-22 NOTE — Telephone Encounter (Signed)
CT dept has contacted and scheduled pt.

## 2022-07-22 NOTE — Telephone Encounter (Signed)
PET denied, will switch to CT ch/a/p w contrast (ASAP) per MD.   CT order entered. Pt informed of change and to expect call from CT dept for scheduling.

## 2022-07-26 ENCOUNTER — Ambulatory Visit
Admission: RE | Admit: 2022-07-26 | Discharge: 2022-07-26 | Disposition: A | Discharge status: Home / Self Care | Payer: Medicaid Other | Source: Ambulatory Visit | Attending: Oncology | Admitting: Oncology

## 2022-07-26 ENCOUNTER — Inpatient Hospital Stay: Payer: Medicaid Other

## 2022-07-26 DIAGNOSIS — C8170 Other classical Hodgkin lymphoma, unspecified site: Secondary | ICD-10-CM | POA: Insufficient documentation

## 2022-07-26 MED ORDER — IOHEXOL 300 MG/ML  SOLN
85.0000 mL | Freq: Once | INTRAMUSCULAR | Status: AC | PRN
  Administered 2022-07-26: 85 mL via INTRAVENOUS

## 2022-08-06 ENCOUNTER — Telehealth: Payer: Self-pay

## 2022-08-06 DIAGNOSIS — C8170 Other classical Hodgkin lymphoma, unspecified site: Secondary | ICD-10-CM

## 2022-08-06 NOTE — Telephone Encounter (Signed)
-----  Message from Earlie Server, MD sent at 08/06/2022  8:43 AM EST ----- Please let him know that I have reviewed his CT results, overall stable, with very minimal increase of lymph node.  I recommend to repeat CT in Mid February, CT chest abdomen pelvis w contrast. Also check labs cbc cmp ESR, LDH, iron tibc ferritin prior to MD visit thanks.   zy

## 2022-08-06 NOTE — Telephone Encounter (Signed)
Spoke to pt and informed him of results and MD recommendation. Pt verbalized understanding.   CT order entered.   Please contact and inform pt of appts (Mid Feb):   Labs (a few days prior to MD, can be same day as CT) MD a few days after lab/CT.

## 2022-09-09 ENCOUNTER — Inpatient Hospital Stay: Payer: Medicaid Other | Attending: Oncology

## 2022-09-20 ENCOUNTER — Other Ambulatory Visit: Payer: Self-pay

## 2022-09-20 ENCOUNTER — Ambulatory Visit: Admission: RE | Admit: 2022-09-20 | Payer: Medicaid Other | Source: Ambulatory Visit

## 2022-09-20 ENCOUNTER — Inpatient Hospital Stay: Payer: Medicaid Other | Attending: Oncology

## 2022-09-20 DIAGNOSIS — C8170 Other classical Hodgkin lymphoma, unspecified site: Secondary | ICD-10-CM

## 2022-09-23 ENCOUNTER — Ambulatory Visit
Admission: RE | Admit: 2022-09-23 | Discharge: 2022-09-23 | Disposition: A | Discharge status: Home / Self Care | Payer: Medicaid Other | Source: Ambulatory Visit | Attending: Oncology | Admitting: Oncology

## 2022-09-23 DIAGNOSIS — C8170 Other classical Hodgkin lymphoma, unspecified site: Secondary | ICD-10-CM | POA: Diagnosis not present

## 2022-09-23 MED ORDER — IOHEXOL 300 MG/ML  SOLN
100.0000 mL | Freq: Once | INTRAMUSCULAR | Status: AC | PRN
  Administered 2022-09-23: 100 mL via INTRAVENOUS

## 2022-09-26 ENCOUNTER — Inpatient Hospital Stay: Payer: Medicaid Other | Admitting: Oncology

## 2022-09-26 ENCOUNTER — Encounter: Payer: Self-pay | Admitting: Oncology

## 2022-09-26 NOTE — Assessment & Plan Note (Deleted)
#   Hodgkin's lymphoma, classic type S/p 2 cycles of ABVD and 4 cycles of AVD.  Repeat PET scan for restaging- insurance denied PET. CT imaged were reviewed.  Similar size of the anterior mediastinal mass/lymph node conglomerate with slight increase in size of the adjacent prevascular lymph nodes. I recommend close monitor and repeat CT in 2 months.

## 2022-09-27 ENCOUNTER — Encounter: Payer: Self-pay | Admitting: Oncology

## 2022-10-24 ENCOUNTER — Inpatient Hospital Stay: Payer: Medicaid Other

## 2022-10-24 ENCOUNTER — Inpatient Hospital Stay: Payer: Medicaid Other | Attending: Oncology | Admitting: Oncology

## 2022-10-24 NOTE — Assessment & Plan Note (Deleted)
#   Hodgkin's lymphoma, classic type S/p 2 cycles of ABVD and 4 cycles of AVD.  Repeat PET scan for restaging- insurance denied PET. CT imaged were reviewed.  Similar size of the anterior mediastinal mass/lymph node conglomerate with slight increase in size of the adjacent prevascular lymph nodes. I recommend close monitor and repeat CT in 2 months.   

## 2022-11-04 ENCOUNTER — Inpatient Hospital Stay: Payer: Medicaid Other

## 2022-12-03 ENCOUNTER — Inpatient Hospital Stay: Payer: Medicaid Other

## 2022-12-05 ENCOUNTER — Inpatient Hospital Stay: Payer: Medicaid Other | Attending: Oncology | Admitting: Oncology

## 2022-12-05 ENCOUNTER — Other Ambulatory Visit: Payer: Self-pay

## 2022-12-05 ENCOUNTER — Inpatient Hospital Stay: Payer: Medicaid Other

## 2022-12-05 ENCOUNTER — Encounter: Payer: Self-pay | Admitting: Oncology

## 2022-12-05 VITALS — BP 136/98 | HR 92 | Temp 97.4°F | Wt 140.8 lb

## 2022-12-05 DIAGNOSIS — D509 Iron deficiency anemia, unspecified: Secondary | ICD-10-CM | POA: Insufficient documentation

## 2022-12-05 DIAGNOSIS — F17210 Nicotine dependence, cigarettes, uncomplicated: Secondary | ICD-10-CM | POA: Insufficient documentation

## 2022-12-05 DIAGNOSIS — R200 Anesthesia of skin: Secondary | ICD-10-CM | POA: Diagnosis not present

## 2022-12-05 DIAGNOSIS — D500 Iron deficiency anemia secondary to blood loss (chronic): Secondary | ICD-10-CM

## 2022-12-05 DIAGNOSIS — M4802 Spinal stenosis, cervical region: Secondary | ICD-10-CM | POA: Diagnosis not present

## 2022-12-05 DIAGNOSIS — M25519 Pain in unspecified shoulder: Secondary | ICD-10-CM | POA: Diagnosis not present

## 2022-12-05 DIAGNOSIS — R7401 Elevation of levels of liver transaminase levels: Secondary | ICD-10-CM

## 2022-12-05 DIAGNOSIS — C8170 Other classical Hodgkin lymphoma, unspecified site: Secondary | ICD-10-CM

## 2022-12-05 DIAGNOSIS — C8192 Hodgkin lymphoma, unspecified, intrathoracic lymph nodes: Secondary | ICD-10-CM | POA: Diagnosis present

## 2022-12-05 DIAGNOSIS — K921 Melena: Secondary | ICD-10-CM | POA: Diagnosis not present

## 2022-12-05 DIAGNOSIS — Z95828 Presence of other vascular implants and grafts: Secondary | ICD-10-CM

## 2022-12-05 DIAGNOSIS — M542 Cervicalgia: Secondary | ICD-10-CM | POA: Diagnosis not present

## 2022-12-05 LAB — IRON AND TIBC
Iron: 31 ug/dL — ABNORMAL LOW (ref 45–182)
Saturation Ratios: 7 % — ABNORMAL LOW (ref 17.9–39.5)
TIBC: 465 ug/dL — ABNORMAL HIGH (ref 250–450)
UIBC: 434 ug/dL

## 2022-12-05 LAB — COMPREHENSIVE METABOLIC PANEL
ALT: 38 U/L (ref 0–44)
AST: 42 U/L — ABNORMAL HIGH (ref 15–41)
Albumin: 4.5 g/dL (ref 3.5–5.0)
Alkaline Phosphatase: 45 U/L (ref 38–126)
Anion gap: 11 (ref 5–15)
BUN: 5 mg/dL — ABNORMAL LOW (ref 6–20)
CO2: 24 mmol/L (ref 22–32)
Calcium: 9.3 mg/dL (ref 8.9–10.3)
Chloride: 99 mmol/L (ref 98–111)
Creatinine, Ser: 0.56 mg/dL — ABNORMAL LOW (ref 0.61–1.24)
GFR, Estimated: >60 mL/min (ref >60)
Glucose, Bld: 86 mg/dL (ref 70–99)
Potassium: 3.7 mmol/L (ref 3.5–5.1)
Sodium: 134 mmol/L — ABNORMAL LOW (ref 135–145)
Total Bilirubin: 0.9 mg/dL (ref 0.3–1.2)
Total Protein: 8.3 g/dL — ABNORMAL HIGH (ref 6.5–8.1)

## 2022-12-05 LAB — CBC WITH DIFFERENTIAL/PLATELET
Abs Immature Granulocytes: 0.02 10*3/uL (ref 0.00–0.07)
Basophils Absolute: 0 10*3/uL (ref 0.0–0.1)
Basophils Relative: 1 %
Eosinophils Absolute: 0 10*3/uL (ref 0.0–0.5)
Eosinophils Relative: 1 %
HCT: 29.6 % — ABNORMAL LOW (ref 39.0–52.0)
Hemoglobin: 10.1 g/dL — ABNORMAL LOW (ref 13.0–17.0)
Immature Granulocytes: 0 %
Lymphocytes Relative: 21 %
Lymphs Abs: 1.3 10*3/uL (ref 0.7–4.0)
MCH: 22.9 pg — ABNORMAL LOW (ref 26.0–34.0)
MCHC: 34.1 g/dL (ref 30.0–36.0)
MCV: 67 fL — ABNORMAL LOW (ref 80.0–100.0)
Monocytes Absolute: 1.1 10*3/uL — ABNORMAL HIGH (ref 0.1–1.0)
Monocytes Relative: 17 %
Neutro Abs: 3.7 10*3/uL (ref 1.7–7.7)
Neutrophils Relative %: 60 %
Platelets: 355 10*3/uL (ref 150–400)
RBC: 4.42 MIL/uL (ref 4.22–5.81)
RDW: 20.5 % — ABNORMAL HIGH (ref 11.5–15.5)
WBC: 6.3 10*3/uL (ref 4.0–10.5)
nRBC: 0.3 % — ABNORMAL HIGH (ref 0.0–0.2)

## 2022-12-05 LAB — LACTATE DEHYDROGENASE: LDH: 108 U/L (ref 98–192)

## 2022-12-05 LAB — SEDIMENTATION RATE: Sed Rate: 5 mm/hr (ref 0–15)

## 2022-12-05 LAB — FERRITIN: Ferritin: 14 ng/mL — ABNORMAL LOW (ref 24–336)

## 2022-12-05 MED ORDER — HEPARIN SOD (PORK) LOCK FLUSH 100 UNIT/ML IV SOLN
500.0000 [IU] | Freq: Once | INTRAVENOUS | Status: AC
  Administered 2022-12-05: 500 [IU] via INTRAVENOUS
  Filled 2022-12-05: qty 5

## 2022-12-05 NOTE — Assessment & Plan Note (Signed)
left antecubital fossa port, he has not flushed as instructed.  I previous discussed with vascular surgeon Dr.Dew.  Plan is to remove this port as it is associated with high risk of occlusion/thrombosis. However patient did not follow up with vascular surgery.  He has port flush today. Continue port flush.

## 2022-12-05 NOTE — Progress Notes (Addendum)
Hematology/Oncology Progress note [phone] [phone]) [phone]     CHIEF COMPLAINTS/REASON FOR VISIT:  Follow up for hodgkin's lymphoma, left side numbness   ASSESSMENT & PLAN:   Malignant lymphoma, Hodgkin's type (HCC) # Hodgkin's lymphoma, classic type S/p 2 cycles of ABVD and 4 cycles of AVD.  Feb 2024 CT scan showed decreased size of the dominant anterior mediastinal mass/lymph node conglomerate and adjacent mediastinal lymph nodes. I recommend close observation, and repeat CT in 4 months.    Cervical stenosis of spinal canal New onset left side numbness, obtain MRI cervical, thoracic, lumbar spine for evaluation.  I ask patient to contact neurosurgeon for evaluation.   Port-A-Cath in place left antecubital fossa port, he has not flushed as instructed.  I previous discussed with vascular surgeon Dr.Dew.  Plan is to remove this port as it is associated with high risk of occlusion/thrombosis. However patient did not follow up with vascular surgery.  He has port flush today. Continue port flush.   Transaminitis Improved.   IDA (iron deficiency anemia) Check iron tibc ferritin.  Lab Results  Component Value Date   IRON 31 (L) 12/05/2022   TIBC 465 (H) 12/05/2022   IRONPCTSAT 7 (L) 12/05/2022   FERRITIN 14 (L) 12/05/2022    Recommend patient to start oral iron supplementation. - RN will call patient to notify recommendation.  Repeat labs in 3 months to evaluate treatment response.   Blood in stool Refer to GI   Orders Placed This Encounter  Procedures   MR Cervical Spine W Wo Contrast    Standing Status:   Future    Standing Expiration Date:   12/05/2023    Order Specific Question:   If indicated for the ordered procedure, I authorize the administration of contrast media per Radiology protocol    Answer:   Yes    Order Specific Question:   What is the patient's sedation requirement?    Answer:   No Sedation    Order Specific Question:   Does the  patient have a pacemaker or implanted devices?    Answer:   No    Order Specific Question:   Use SRS Protocol?    Answer:   No    Order Specific Question:   Preferred imaging location?    Answer:   [facility] (table limit - [phone])   MR Thoracic Spine W Wo Contrast    Standing Status:   Future    Standing Expiration Date:   12/05/2023    Order Specific Question:   GRA to provide read?    Answer:   Yes    Order Specific Question:   If indicated for the ordered procedure, I authorize the administration of contrast media per Radiology protocol    Answer:   Yes    Order Specific Question:   What is the patient's sedation requirement?    Answer:   No Sedation    Order Specific Question:   Use SRS Protocol?    Answer:   No    Order Specific Question:   Does the patient have a pacemaker or implanted devices?    Answer:   No    Order Specific Question:   Preferred imaging location?    Answer:   [facility] (table limit - 550lbs)   MR Lumbar Spine W Wo Contrast    Standing Status:   Future    Standing Expiration Date:   12/05/2023    Order Specific Question:   If indicated for  the ordered procedure, I authorize the administration of contrast media per Radiology protocol    Answer:   Yes    Order Specific Question:   What is the patient's sedation requirement?    Answer:   No Sedation    Order Specific Question:   Does the patient have a pacemaker or implanted devices?    Answer:   No    Order Specific Question:   Use SRS Protocol?    Answer:   No    Order Specific Question:   Preferred imaging location?    Answer:   [facility] (table limit - 550lbs)   CT CHEST ABDOMEN PELVIS W CONTRAST    Standing Status:   Future    Standing Expiration Date:   12/05/2023    Scheduling Instructions:     To be scheduled a few days prior to seeing Dr. Cathie Hoops.    Order Specific Question:   If indicated for the ordered procedure, I authorize the administration of contrast media per Radiology  protocol    Answer:   Yes    Order Specific Question:   Does the patient have a contrast media/X-ray dye allergy?    Answer:   No    Order Specific Question:   Preferred imaging location?    Answer[facility]    Order Specific Question:   If indicated for the ordered procedure, I authorize the administration of oral contrast media per Radiology protocol    Answer:   Yes   Iron and TIBC    Standing Status:   Future    Number of Occurrences:   1    Standing Expiration Date:   12/05/2023   Ferritin    Standing Status:   Future    Number of Occurrences:   1    Standing Expiration Date:   12/05/2023   CBC with Differential (Cancer Center Only)    Standing Status:   Future    Standing Expiration Date:   12/05/2023   CMP (Cancer Center only)    Standing Status:   Future    Standing Expiration Date:   12/05/2023   Lactate dehydrogenase    Standing Status:   Future    Standing Expiration Date:   12/05/2023   Ambulatory referral to Gastroenterology    Referral Priority:   Routine    Referral Type:   Consultation    Referral Reason:   Specialty Services Required    Number of Visits Requested:   1    Follow up per LOS  All questions were answered. The patient knows to call the clinic with any problems, questions or concerns.  Rickard Patience, MD, PhD [facility] Hematology Oncology 12/05/2022     HISTORY OF PRESENTING ILLNESS:   Preston [name] is a  37 y.o.  male with PMH listed below was seen in consultation at the request of  No ref. provider found  for evaluation of Hodgkin's lymphoma.  Patient has a known diagnosis of Hodgkin's lymphoma and wants to transfer his oncology care to our cancer center.  Extensive medical records reviewed was performed  Previous oncology care was [facility] Per note  10/28/2019 biopsy of right chest wall mass, fibromuscular soft tissue with atypical infiltrate consistent with Hodgkin lymphoma, classical type. There were  scattered single atypical cells with morphology suggestive of Sharmon Revere variant cells. These cells were reactive with CD 45, CD 15, CD 30 and nonreactive for pan keratin, cam 5.7, CD3,  CD5, CD10, CD 20, melan-A, Sox 10. Ki-67 stained majority of Reed-Sternberg like cells.  Noticed right upper chest wall lump in January 2021. He was incarcerated for 27 months, released in May 2021.  Unintentional weight loss about 30 pounds, drenching night sweats.  01/17/2020 PET scan showed bulky anterior mediastinal mass 12.7cm. with extensive adenopathy in neck, right axilla, mediastinum, bilateral hilar regions, upper abdominal ligament adenopathy and retroperitoneal adenopathy. Abnormal hypermetabolic activity within a splenic focus. Mild patchy ground glass opacity in the right lower lobe may reflect postobstructive inflammatory change without hypermetabolic activity.   01/17/2020 Echo 1. Normal biventricular systolic function and size. LVEF 60%. Average global longitudinal strain is normal, -20%   2. No significant valvular dysfunction.   3. Normal right-and left-sided filling pressures.  4. No prior study for comparison.  12/30/2019 LDH 588 01/24/2020 CRP 67.2, ESR 60  Stage IIIB Hodgkin's lymphoma, he was recommended ABVD x 2 cycles, followed by PET with plan of AVD x 4 or escalate to BEACOPP if not favorable results.   01/26/2020 ABVD Day 1,15 x 2 cycles.   03/24/2020 PET scan showed significant interval response to therapy. All PET positive lesions previously resolved 03/30/2020 AVD D1 04/13/2020 AVD D15  # He was not able to have chest medi port placed due to chest mass. He has central line placed on left upper extremity. #Central line access.  Patient has seen Dr. Wyn Quaker for evaluation of the left upper extremity central line.  I discussed with Dr. Wyn Quaker and tip is in good position in the SVC.  Okay to use from vascular surgeon's aspect.  [facility] pathology consultation on biopsy specimen from 10/28/2019 showed  findings compatible with classic Hodgkin's lymphoma.  # 06/26/2020 - 11/29/20201 - Cycle 4 AVD  He no showed for his chemotherapy appointment in mid December 2021.    08/07/2020 seen at that time he reports acute onset of chest pain, left shoulder, back and left arm. He was sent to emergency room to have a CT scan done to rule out acute pulmonary embolism 08/07/2020, CT chest angiogram showed no pulmonary emboli or acute chest vascular pathology. Superior mediastinal lymphadenopathy consistent with clinical history of lymphoma.  Largest node measures 8.3 x 8.3 x 3.8 cm.  Several other smaller but pathologic lymph nodes in the anterior mediastinum, second largest at the aortopulmonary window measuring 1.3 x 1.9 x 3.1 cm.  No pulmonary mass or nodule.  Patient also had negative troponin,Patient was discharged home for further follow-up with cancer center.  ER physician provided a prescription of Percocet 5/325 mg 1 tablet every 4 hours as needed for severe pain.  I obtained additional work-up for work-up to see if there is lymphoma progression.  08/23/2020 PET scan showed dominant right paramidline anterior mediastinal mass lesion is stable in size comparing to her outside PET scan on 03/24/2020.  Lesion showed low-level FDG uptake [place] 3, not appreciably changed in the interval.  Mildly enlarged hepatoduodenal ligament lymph node measures minimally smaller on CT imaging today with [place] 2 uptake.  No additional area of unexpected or suspicious hypermetabolic zone.  No other findings of lymphadenopathy on noncontrast CT image today.  Patient was recommended to proceed with 2D echocardiogram and he had Echo done on 09/08/2020. The delay was due to him not showing up to appointment.  #09/08/2020  2D echocardiogram showed low normal LVEF 50% to 55%.  This is slightly decreased from his outside echocardiogram in June 2021 with LVEF of 60%.  09/01/2020- 09/15/2020 Cycle  5 AVD 2/222022 Cycle 6 D1  AVD  Neck shoulder pain worsen.   #10/18/2020  MRI cervical spine showed bulky degenerative disc extrusion at C5-C6 with severe spinal stenosis, spinal cord mass-effect and abnormal cord signal (edema and/or myelomalacia). Moderate to severe multifactorial right C6 foraminal stenosis. Smaller disc herniation at C3-C4, and lobulated disc bulging at C4-C5 with up to mild spinal stenosis at those levels. Up to moderate degenerative right C4, left C7, and moderate to severe bilateral C8 neural foraminal stenosis. 10/20/20 Patient underwent  C5-6 anterior cervical discectomy and fusion    11/10/2020 he was seen in the clinic prior to chemotherapy. At that time, he felt weak and has had weight loss due to his neck surgery. Therefore decision was made to hold chemo and give him 1-2 weeks for recovery.  Then he had multiple no show for chemotherapy treatment appointment.   # 5/26/.2022 cycle 6 D15 AVD.  #09/10/2021 CT chest abdomen pelvis w contrast showed stable mediastinal adenopathy, no progressive findings.  Stable low-attenuation celiac axis node.  No mesenteric or retroperitoneal or pelvic adenopathy.  Stable diffuse fatty infiltration of the liver.  Stable renal cyst.  Age advanced coronary artery calcifications.  # 03/12/22 CT chest abdomen pelvis w contrast showed 1. Slight interval increase in size of the dominant anterior mediastinal nodal tissue. 2. Additional anterior mediastinal lymph nodes and the low-density celiac axis lymph node are stable. 3. No new areas of adenopathy in the chest, abdomen or pelvis.  09/23/2022 CT chest abdomen pelvis w contrast showed 1. Decreased size of the dominant anterior mediastinal mass/lymph node conglomerate and adjacent mediastinal lymph nodes. 2. Stable low-attenuation celiac axis lymph nodes. 3. No new or progressive adenopathy in the chest, abdomen or pelvis and no splenomegaly. 4.  Aortic Atherosclerosis  INTERVAL HISTORY Gail Creekmore is a 37 y.o.  male who has above history reviewed by me today presents for follow up visit for management of Hodgkin's  Lymphoma He has decreased appetite, lost 9 pounds of weight since 3 months ago.  New onset of left side numbness, " from arm to all the way down my left side" for a few weeks.  Neck and back pain. No incontinence of bowel or bladder. Weight is stable. He is not compliant with port flush.    Review of Systems  Constitutional:  Negative for appetite change, chills, fatigue, fever and unexpected weight change.  HENT:   Negative for hearing loss and voice change.   Eyes:  Negative for eye problems and icterus.  Respiratory:  Negative for chest tightness, cough and shortness of breath.   Cardiovascular:  Negative for leg swelling.  Gastrointestinal:  Negative for abdominal distention, abdominal pain and nausea.  Endocrine: Negative for hot flashes.  Genitourinary:  Negative for difficulty urinating, dysuria and frequency.   Musculoskeletal:  Positive for back pain and neck pain. Negative for arthralgias.  Skin:  Negative for itching and rash.  Neurological:  Positive for numbness. Negative for light-headedness.  Hematological:  Negative for adenopathy. Does not bruise/bleed easily.  Psychiatric/Behavioral:  Negative for confusion.     MEDICAL HISTORY:  Past Medical History:  Diagnosis Date   Hodgkin's lymphoma    Tobacco use     SURGICAL HISTORY: Past Surgical History:  Procedure Laterality Date   ANTERIOR CERVICAL DECOMP/DISCECTOMY FUSION N/A 10/20/2020   Procedure: ANTERIOR CERVICAL DECOMPRESSION/DISCECTOMY FUSION 1 LEVEL C5-C6;  Surgeon: Venetia Night, MD;  Location: [facility] ORS;  Service: Neurosurgery;  Laterality: N/A;    SOCIAL HISTORY:  Social History   Socioeconomic History   Marital status: Married    Spouse name: Not on file   Number of children: Not on file   Years of education: Not on file   Highest education level: Not on file  Occupational History   Not on  file  Tobacco Use   Smoking status: Every Day    Packs/day: 0.50    Years: 20.00    Additional pack years: 0.00    Total pack years: 10.00    Types: Cigarettes   Smokeless tobacco: Never  Vaping Use   Vaping Use: Never used  Substance and Sexual Activity   Alcohol use: Yes    Comment: occasional    Drug use: Not Currently   Sexual activity: Not on file  Other Topics Concern   Not on file  Social History Narrative   Not on file   Social Determinants of Health   Financial Resource Strain: Not on file  Food Insecurity: Not on file  Transportation Needs: Not on file  Physical Activity: Not on file  Stress: Not on file  Social Connections: Not on file  Intimate Partner Violence: Not on file    FAMILY HISTORY: Family History  Problem Relation Age of Onset   Heart failure Mother    Pneumonia Father     ALLERGIES:  has No Known Allergies.  MEDICATIONS:  Current Outpatient Medications  Medication Sig Dispense Refill   acetaminophen (TYLENOL) 500 MG tablet Take 1,000 mg by mouth every 6 (six) hours as needed for mild pain or moderate pain.     ibuprofen (ADVIL) 200 MG tablet Take 600-800 mg by mouth every 6 (six) hours as needed for mild pain or moderate pain.     dexamethasone (DECADRON) 4 MG tablet Take 1 tablet (4 mg total) by mouth daily. (Patient not taking: Reported on 12/05/2022) 5 tablet 0   methocarbamol (ROBAXIN) 500 MG tablet Take 1 tablet (500 mg total) by mouth every 6 (six) hours as needed for muscle spasms. (Patient not taking: Reported on 09/12/2021) 21 tablet 0   omeprazole (PRILOSEC) 20 MG capsule Take 1 capsule (20 mg total) by mouth daily. (Patient not taking: Reported on 11/10/2020) 30 capsule 0   ondansetron (ZOFRAN) 8 MG tablet Take 1 tablet (8 mg total) by mouth 2 (two) times daily. (Patient not taking: Reported on 09/12/2021) 60 tablet 1   oxyCODONE-acetaminophen (PERCOCET) 5-325 MG tablet Take 1-2 tablets by mouth every 4 (four) hours as needed for moderate  pain or severe pain. (Patient not taking: Reported on 12/05/2022) 30 tablet 0   prochlorperazine (COMPAZINE) 10 MG tablet Take 1 tablet (10 mg total) by mouth every 6 (six) hours as needed for nausea or vomiting. (Patient not taking: Reported on 09/12/2021) 60 tablet 1   traMADol (ULTRAM) 50 MG tablet Take 1 tablet (50 mg total) by mouth every 6 (six) hours as needed. (Patient not taking: Reported on 12/05/2022) 30 tablet 0   No current facility-administered medications for this visit.   Facility-Administered Medications Ordered in Other Visits  Medication Dose Route Frequency Provider Last Rate Last Admin   heparin lock flush 100 unit/mL  500 Units Intravenous Once Rickard Patience, MD       sodium chloride flush (NS) 0.9 % injection 10 mL  10 mL Intravenous PRN Rickard Patience, MD   10 mL at 08/07/20 0826     PHYSICAL EXAMINATION: ECOG PERFORMANCE STATUS: 0 - Asymptomatic Vitals:   12/05/22 0900  BP: (!) 136/98  Pulse: 92  Temp: (!) 97.4 F (36.3 C)  SpO2: 98%   Filed Weights   12/05/22 0900  Weight: 140 lb 12.8 oz (63.9 kg)    Physical Exam Constitutional:      General: He is not in acute distress. HENT:     Head: Normocephalic and atraumatic.  Eyes:     General: No scleral icterus. Cardiovascular:     Rate and Rhythm: Normal rate and regular rhythm.     Heart sounds: Normal heart sounds.  Pulmonary:     Effort: Pulmonary effort is normal. No respiratory distress.     Breath sounds: No wheezing.  Abdominal:     General: Bowel sounds are normal. There is no distension.     Palpations: Abdomen is soft.  Musculoskeletal:        General: No deformity. Normal range of motion.     Cervical back: Normal range of motion and neck supple.  Skin:    General: Skin is warm and dry.     Findings: No erythema or rash.  Neurological:     Mental Status: He is alert and oriented to person, place, and time. Mental status is at baseline.     Cranial Nerves: No cranial nerve deficit.      Coordination: Coordination normal.  Psychiatric:        Mood and Affect: Mood normal.   left upper arm central line port.      LABORATORY DATA:  I have reviewed the data as listed    Latest Ref Rng & Units 12/05/2022    8:53 AM 07/15/2022    1:28 PM 03/14/2022    1:29 PM  CBC  WBC 4.0 - 10.5 K/uL 6.3  5.7  5.9   Hemoglobin 13.0 - 17.0 g/dL 16.1  09.6  04.5   Hematocrit 39.0 - 52.0 % 29.6  34.6  33.1   Platelets 150 - 400 K/uL 355  290  196       Latest Ref Rng & Units 12/05/2022    8:53 AM 07/15/2022    1:28 PM 03/14/2022    1:29 PM  CMP  Glucose 70 - 99 mg/dL 86  96  85   BUN 6 - 20 mg/dL 5  7  <5   Creatinine 4.09 - 1.24 mg/dL 8.11  9.14  7.82   Sodium 135 - 145 mmol/L 134  137  134   Potassium 3.5 - 5.1 mmol/L 3.7  4.1  4.2   Chloride 98 - 111 mmol/L 99  100  100   CO2 22 - 32 mmol/L 24  23  27    Calcium 8.9 - 10.3 mg/dL 9.3  9.5  9.3   Total Protein 6.5 - 8.1 g/dL 8.3  8.6  7.8   Total Bilirubin 0.3 - 1.2 mg/dL 0.9  1.6  0.6   Alkaline Phos 38 - 126 U/L 45  58  45   AST 15 - 41 U/L 42  111  84   ALT 0 - 44 U/L 38  78  90      RADIOGRAPHIC STUDIES: I have personally reviewed the radiological images as listed and agreed with the findings in the report. No results found.

## 2022-12-05 NOTE — Assessment & Plan Note (Signed)
New onset left side numbness, obtain MRI cervical, thoracic, lumbar spine for evaluation.  I ask patient to contact neurosurgeon for evaluation.

## 2022-12-05 NOTE — Assessment & Plan Note (Addendum)
Check iron tibc ferritin.  Lab Results  Component Value Date   IRON 31 (L) 12/05/2022   TIBC 465 (H) 12/05/2022   IRONPCTSAT 7 (L) 12/05/2022   FERRITIN 14 (L) 12/05/2022    Recommend patient to start oral iron supplementation. - RN will call patient to notify recommendation.  Repeat labs in 3 months to evaluate treatment response.

## 2022-12-05 NOTE — Assessment & Plan Note (Signed)
Refer to GI 

## 2022-12-05 NOTE — Assessment & Plan Note (Signed)
Improved

## 2022-12-05 NOTE — Assessment & Plan Note (Addendum)
#   Hodgkin's lymphoma, classic type S/p 2 cycles of ABVD and 4 cycles of AVD.  Feb 2024 CT scan showed decreased size of the dominant anterior mediastinal mass/lymph node conglomerate and adjacent mediastinal lymph nodes. I recommend close observation, and repeat CT in 4 months.

## 2022-12-06 ENCOUNTER — Telehealth: Payer: Self-pay

## 2022-12-06 MED ORDER — POLYSACCHARIDE IRON COMPLEX 150 MG PO CAPS: 150.0000 mg | ORAL_CAPSULE | Freq: Two times a day (BID) | ORAL | 1 refills | Status: DC

## 2022-12-06 NOTE — Telephone Encounter (Signed)
Sorry, forwarded message to scheduling by mistake, but Dr. Cathie Hoops sent another message stating that  3 month lab was not needed. She will just check iron labs when he is here in August.  Can you cancel lab in July please.

## 2022-12-06 NOTE — Telephone Encounter (Signed)
-----   Message from Rickard Patience, MD sent at 12/06/2022  8:43 AM EDT ----- Please let  him know that his iron level is low.  I recommend him to take oral iron supplementation. Rx sent to his pharmacy.  I recommend to add another lab encounter in 3 months to monitor treatment response.  labs ordered.

## 2022-12-06 NOTE — Telephone Encounter (Signed)
Called and spoke to s/o Garrett and informed her of rx sent to pharmacy

## 2022-12-06 NOTE — Addendum Note (Signed)
Addended by: Rickard Patience on: 12/06/2022 08:47 AM   Modules accepted: Orders

## 2022-12-14 ENCOUNTER — Ambulatory Visit: Admission: RE | Admit: 2022-12-14 | Payer: Medicaid Other | Source: Ambulatory Visit

## 2022-12-14 ENCOUNTER — Ambulatory Visit: Payer: Medicaid Other | Attending: Oncology

## 2022-12-28 ENCOUNTER — Encounter: Payer: Self-pay | Admitting: Emergency Medicine

## 2022-12-28 ENCOUNTER — Other Ambulatory Visit: Payer: Self-pay

## 2022-12-28 ENCOUNTER — Emergency Department: Payer: Medicaid Other

## 2022-12-28 ENCOUNTER — Observation Stay
Admission: EM | Admit: 2022-12-28 | Discharge: 2022-12-29 | Disposition: A | Discharge status: Home / Self Care | Payer: Medicaid Other | Attending: Internal Medicine | Admitting: Internal Medicine

## 2022-12-28 DIAGNOSIS — D509 Iron deficiency anemia, unspecified: Secondary | ICD-10-CM | POA: Diagnosis not present

## 2022-12-28 DIAGNOSIS — F17210 Nicotine dependence, cigarettes, uncomplicated: Secondary | ICD-10-CM | POA: Diagnosis not present

## 2022-12-28 DIAGNOSIS — F1090 Alcohol use, unspecified, uncomplicated: Secondary | ICD-10-CM | POA: Insufficient documentation

## 2022-12-28 DIAGNOSIS — R748 Abnormal levels of other serum enzymes: Secondary | ICD-10-CM | POA: Diagnosis not present

## 2022-12-28 DIAGNOSIS — K561 Intussusception: Secondary | ICD-10-CM | POA: Diagnosis not present

## 2022-12-28 DIAGNOSIS — Z95828 Presence of other vascular implants and grafts: Secondary | ICD-10-CM

## 2022-12-28 DIAGNOSIS — R52 Pain, unspecified: Secondary | ICD-10-CM | POA: Diagnosis not present

## 2022-12-28 DIAGNOSIS — R1084 Generalized abdominal pain: Secondary | ICD-10-CM | POA: Diagnosis not present

## 2022-12-28 DIAGNOSIS — M4802 Spinal stenosis, cervical region: Secondary | ICD-10-CM | POA: Diagnosis present

## 2022-12-28 DIAGNOSIS — M545 Low back pain, unspecified: Secondary | ICD-10-CM | POA: Diagnosis present

## 2022-12-28 DIAGNOSIS — G8929 Other chronic pain: Secondary | ICD-10-CM | POA: Diagnosis not present

## 2022-12-28 DIAGNOSIS — Z452 Encounter for adjustment and management of vascular access device: Secondary | ICD-10-CM | POA: Diagnosis not present

## 2022-12-28 DIAGNOSIS — R7401 Elevation of levels of liver transaminase levels: Secondary | ICD-10-CM | POA: Diagnosis present

## 2022-12-28 DIAGNOSIS — R7989 Other specified abnormal findings of blood chemistry: Secondary | ICD-10-CM | POA: Insufficient documentation

## 2022-12-28 DIAGNOSIS — Z789 Other specified health status: Secondary | ICD-10-CM | POA: Insufficient documentation

## 2022-12-28 DIAGNOSIS — Z720 Tobacco use: Secondary | ICD-10-CM | POA: Diagnosis present

## 2022-12-28 DIAGNOSIS — F1010 Alcohol abuse, uncomplicated: Secondary | ICD-10-CM

## 2022-12-28 DIAGNOSIS — C8190 Hodgkin lymphoma, unspecified, unspecified site: Secondary | ICD-10-CM | POA: Diagnosis present

## 2022-12-28 HISTORY — DX: Other chronic pain: G89.29

## 2022-12-28 HISTORY — DX: Alcohol abuse, uncomplicated: F10.10

## 2022-12-28 HISTORY — DX: Low back pain, unspecified: M54.50

## 2022-12-28 HISTORY — DX: Spinal stenosis, cervical region: M48.02

## 2022-12-28 LAB — CBC WITH DIFFERENTIAL/PLATELET
Abs Immature Granulocytes: 0.01 10*3/uL (ref 0.00–0.07)
Basophils Absolute: 0 10*3/uL (ref 0.0–0.1)
Basophils Relative: 1 %
Eosinophils Absolute: 0 10*3/uL (ref 0.0–0.5)
Eosinophils Relative: 1 %
HCT: 33.6 % — ABNORMAL LOW (ref 39.0–52.0)
Hemoglobin: 11.7 g/dL — ABNORMAL LOW (ref 13.0–17.0)
Immature Granulocytes: 0 %
Lymphocytes Relative: 23 %
Lymphs Abs: 1 10*3/uL (ref 0.7–4.0)
MCH: 23.4 pg — ABNORMAL LOW (ref 26.0–34.0)
MCHC: 34.8 g/dL (ref 30.0–36.0)
MCV: 67.2 fL — ABNORMAL LOW (ref 80.0–100.0)
Monocytes Absolute: 0.6 10*3/uL (ref 0.1–1.0)
Monocytes Relative: 15 %
Neutro Abs: 2.6 10*3/uL (ref 1.7–7.7)
Neutrophils Relative %: 60 %
Platelets: 335 10*3/uL (ref 150–400)
RBC: 5 MIL/uL (ref 4.22–5.81)
RDW: 21.1 % — ABNORMAL HIGH (ref 11.5–15.5)
Smear Review: NORMAL
WBC: 4.3 10*3/uL (ref 4.0–10.5)
nRBC: 0 % (ref 0.0–0.2)

## 2022-12-28 LAB — COMPREHENSIVE METABOLIC PANEL
ALT: 135 U/L — ABNORMAL HIGH (ref 0–44)
AST: 229 U/L — ABNORMAL HIGH (ref 15–41)
Albumin: 4.7 g/dL (ref 3.5–5.0)
Alkaline Phosphatase: 52 U/L (ref 38–126)
Anion gap: 12 (ref 5–15)
BUN: <5 mg/dL — ABNORMAL LOW (ref 6–20)
CO2: 23 mmol/L (ref 22–32)
Calcium: 9.1 mg/dL (ref 8.9–10.3)
Chloride: 98 mmol/L (ref 98–111)
Creatinine, Ser: 0.67 mg/dL (ref 0.61–1.24)
GFR, Estimated: >60 mL/min (ref >60)
Glucose, Bld: 90 mg/dL (ref 70–99)
Potassium: 3.8 mmol/L (ref 3.5–5.1)
Sodium: 133 mmol/L — ABNORMAL LOW (ref 135–145)
Total Bilirubin: 0.9 mg/dL (ref 0.3–1.2)
Total Protein: 8.2 g/dL — ABNORMAL HIGH (ref 6.5–8.1)

## 2022-12-28 LAB — ETHANOL: Alcohol, Ethyl (B): 58 mg/dL — ABNORMAL HIGH (ref <10)

## 2022-12-28 LAB — LACTIC ACID, PLASMA
Lactic Acid, Venous: 2.6 mmol/L (ref 0.5–1.9)
Lactic Acid, Venous: 2.5 mmol/L (ref 0.5–1.9)

## 2022-12-28 LAB — LIPASE, BLOOD: Lipase: 61 U/L — ABNORMAL HIGH (ref 11–51)

## 2022-12-28 MED ORDER — TRAMADOL HCL 50 MG PO TABS: 50.0000 mg | ORAL_TABLET | Freq: Four times a day (QID) | ORAL | Status: DC | PRN

## 2022-12-28 MED ORDER — ACETAMINOPHEN 650 MG RE SUPP
650.0000 mg | Freq: Four times a day (QID) | RECTAL | Status: DC | PRN
Start: 2022-12-28 — End: 2022-12-28

## 2022-12-28 MED ORDER — SODIUM CHLORIDE 0.9 % IV SOLN
12.5000 mg | Freq: Four times a day (QID) | INTRAVENOUS | Status: AC | PRN
  Filled 2022-12-28: qty 0.5

## 2022-12-28 MED ORDER — LORAZEPAM 2 MG/ML IJ SOLN: 2.0000 mg | INTRAMUSCULAR | Status: DC | PRN

## 2022-12-28 MED ORDER — ONDANSETRON HCL 4 MG/2ML IJ SOLN: 4.0000 mg | Freq: Four times a day (QID) | INTRAMUSCULAR | Status: DC | PRN

## 2022-12-28 MED ORDER — MORPHINE SULFATE (PF) 4 MG/ML IV SOLN
4.0000 mg | Freq: Once | INTRAVENOUS | Status: AC
  Administered 2022-12-28: 4 mg via INTRAVENOUS
  Filled 2022-12-28: qty 1

## 2022-12-28 MED ORDER — MORPHINE SULFATE (PF) 4 MG/ML IV SOLN
4.0000 mg | INTRAVENOUS | Status: DC | PRN
  Administered 2022-12-28 (×2): 4 mg via INTRAVENOUS
  Filled 2022-12-28 (×2): qty 1

## 2022-12-28 MED ORDER — SENNOSIDES-DOCUSATE SODIUM 8.6-50 MG PO TABS: 1.0000 | ORAL_TABLET | Freq: Every evening | ORAL | Status: DC | PRN

## 2022-12-28 MED ORDER — ACETAMINOPHEN 325 MG PO TABS
650.0000 mg | ORAL_TABLET | Freq: Four times a day (QID) | ORAL | Status: DC | PRN
  Administered 2022-12-28: 650 mg via ORAL

## 2022-12-28 MED ORDER — HEPARIN SODIUM (PORCINE) 5000 UNIT/ML IJ SOLN
5000.0000 [IU] | Freq: Three times a day (TID) | INTRAMUSCULAR | Status: DC
  Administered 2022-12-28 – 2022-12-29 (×2): 5000 [IU] via SUBCUTANEOUS
  Filled 2022-12-28 (×2): qty 1

## 2022-12-28 MED ORDER — IOHEXOL 300 MG/ML  SOLN
100.0000 mL | Freq: Once | INTRAMUSCULAR | Status: AC | PRN
  Administered 2022-12-28: 100 mL via INTRAVENOUS

## 2022-12-28 MED ORDER — SODIUM CHLORIDE 0.9 % IV SOLN: INTRAVENOUS | Status: DC

## 2022-12-28 MED ORDER — LACTATED RINGERS IV BOLUS
1000.0000 mL | Freq: Once | INTRAVENOUS | Status: AC
  Administered 2022-12-28: 1000 mL via INTRAVENOUS

## 2022-12-28 MED ORDER — FENTANYL CITRATE PF 50 MCG/ML IJ SOSY
25.0000 ug | PREFILLED_SYRINGE | INTRAMUSCULAR | Status: DC | PRN
  Administered 2022-12-28 – 2022-12-29 (×2): 25 ug via INTRAVENOUS
  Filled 2022-12-28 (×2): qty 1

## 2022-12-28 MED ORDER — ONDANSETRON HCL 4 MG PO TABS: 4.0000 mg | ORAL_TABLET | Freq: Four times a day (QID) | ORAL | Status: DC | PRN

## 2022-12-28 MED ORDER — POLYSACCHARIDE IRON COMPLEX 150 MG PO CAPS
150.0000 mg | ORAL_CAPSULE | Freq: Two times a day (BID) | ORAL | Status: DC
  Administered 2022-12-28 – 2022-12-29 (×2): 150 mg via ORAL
  Filled 2022-12-28 (×2): qty 1

## 2022-12-28 MED ORDER — ACETAMINOPHEN 650 MG RE SUPP: 650.0000 mg | Freq: Four times a day (QID) | RECTAL | Status: DC | PRN

## 2022-12-28 MED ORDER — NICOTINE 21 MG/24HR TD PT24
21.0000 mg | MEDICATED_PATCH | Freq: Every day | TRANSDERMAL | Status: DC | PRN
  Administered 2022-12-28: 21 mg via TRANSDERMAL
  Filled 2022-12-28: qty 1

## 2022-12-28 MED ORDER — SODIUM CHLORIDE 0.9 % IV BOLUS
1000.0000 mL | Freq: Once | INTRAVENOUS | Status: AC
  Administered 2022-12-28: 1000 mL via INTRAVENOUS

## 2022-12-28 MED ORDER — ACETAMINOPHEN 325 MG PO TABS: 650.0000 mg | ORAL_TABLET | Freq: Four times a day (QID) | ORAL | Status: DC | PRN

## 2022-12-28 MED ORDER — ONDANSETRON HCL 4 MG/2ML IJ SOLN
4.0000 mg | Freq: Once | INTRAMUSCULAR | Status: AC
  Administered 2022-12-28: 4 mg via INTRAVENOUS
  Filled 2022-12-28: qty 2

## 2022-12-28 NOTE — ED Triage Notes (Addendum)
Pt presents via [facility] from home with complaints of lower back pain for the last 1-2 years following a spinal surgery. Rates the pain 10/10 - has been alternating motrin and tylenol.  Hx of Hodgkins lymphoma. Pt has an MRI scheduled on 5/25. A&Ox4 at this time. Denies falls, injury, heavy lifting, CP or SOB.

## 2022-12-28 NOTE — Hospital Course (Signed)
Mr. Albino Mcclaran is a 37 year old male with history of malignant Hodgkin's lymphoma, with left antecubital fossa Port-A-Cath, chronic low back pain, alcohol use, iron deficiency anemia, who presents emergency department for chief concerns of low back pain and right-sided abdominal pain.  Vitals in the ED showed temperature of 98.4, respiration rate of 18, heart rate 97, blood pressure 128/83, SpO2 90% on room air.  Serum sodium is 133, potassium 3.8, chloride 98, bicarb 23, BUN of 90, serum creatinine less than 5, nonfasting blood glucose 90, WBC 4.3, hemoglobin 11.7, platelets of 335.  eGFR greater than 60.  AST was elevated at 229, ALT elevated at 135.  CT abdomen pelvis with contrast: Was read as no acute findings.  Portable short segment transient intussusception in the left mid abdomen without obstruction.  Fatty liver.  Aortic atherosclerosis.  EDP discussed case with general surgery who recommends no current intervention at this time and to check a lactic acid.  Lactic acid was checked and was found to be elevated at 2.6.  ED treatment: Morphine 4 mg IV one-time dose, ondansetron 4 mg IV one-time dose, sodium chloride 1 L bolus.

## 2022-12-28 NOTE — ED Provider Notes (Signed)
[facility] Provider Note    Event Date/Time   First MD Initiated Contact with Patient 12/28/22 [phone]     (approximate)   History   Back Pain   HPI  Landun Czarny is a 37 y.o. male with history of Hodgkin's lymphoma, lumbar spine surgery, presents emergency department with right flank pain.  Also stated has lost some of the mobility of his left leg but this has been ongoing for a year.  Patient has a history of lumbar spine surgery approximately 1 year ago and states it has not been normal since then.  He is currently doing chemotherapy for lymphoma.  States he is also drinking 3 40 ounce beers a day.  No vomiting or diarrhea.      Physical Exam   Triage Vital Signs: ED Triage Vitals [12/28/22 0616]  Enc Vitals Group     BP 123/74     Pulse Rate 97     Resp 18     Temp 98.4 F (36.9 C)     Temp Source Oral     SpO2 98 %     Weight 140 lb (63.5 kg)     Height 5\' 10"  (1.778 m)     Head Circumference      Peak Flow      Pain Score      Pain Loc      Pain Edu?      Excl. in GC?     Most recent vital signs: Vitals:   12/28/22 0616 12/28/22 1053  BP: 123/74 128/83  Pulse: 97 97  Resp: 18 18  Temp: 98.4 F (36.9 C) 98.4 F (36.9 C)  SpO2: 98% 98%     General: Awake, no distress.   CV:  Good peripheral perfusion. regular rate and  rhythm Resp:  Normal effort. Lungs cta Abd:  No distention.  Tender in the right upper quadrant Other:  Lumbar spine mildly tender, 5 or 5 strength in lower extremities with dorsiflexion and extension, neurovascular appears to be intact   ED Results / Procedures / Treatments   Labs (all labs ordered are listed, but only abnormal results are displayed) Labs Reviewed  CBC WITH DIFFERENTIAL/PLATELET - Abnormal; Notable for the following components:      Result Value   Hemoglobin 11.7 (*)    HCT 33.6 (*)    MCV 67.2 (*)    MCH 23.4 (*)    RDW 21.1 (*)    All other components within normal limits   COMPREHENSIVE METABOLIC PANEL - Abnormal; Notable for the following components:   Sodium 133 (*)    BUN <5 (*)    Total Protein 8.2 (*)    AST 229 (*)    ALT 135 (*)    All other components within normal limits  LIPASE, BLOOD - Abnormal; Notable for the following components:   Lipase 61 (*)    All other components within normal limits  LACTIC ACID, PLASMA - Abnormal; Notable for the following components:   Lactic Acid, Venous 2.6 (*)    All other components within normal limits  LACTIC ACID, PLASMA     EKG     RADIOLOGY Ultrasound right upper quadrant    PROCEDURES:   Procedures   MEDICATIONS ORDERED IN ED: Medications  iohexol (OMNIPAQUE) 300 MG/ML solution 100 mL (100 mLs Intravenous Contrast Given 12/28/22 1023)  sodium chloride 0.9 % bolus 1,000 mL (1,000 mLs Intravenous New Bag/Given 12/28/22 1155)  morphine (PF) 4 MG/ML  injection 4 mg (4 mg Intravenous Given 12/28/22 1153)  ondansetron (ZOFRAN) injection 4 mg (4 mg Intravenous Given 12/28/22 1154)     IMPRESSION / MDM / ASSESSMENT AND PLAN / ED COURSE  I reviewed the triage vital signs and the nursing notes.                              Differential diagnosis includes, but is not limited to, alcoholic liver, hepatitis, liver failure, acute cholecystitis, kidney stone, pancreatitis , cauda equina  Patient's presentation is most consistent with acute presentation with potential threat to life or bodily function.   Patient does have a MRI scheduled for May 25.  Labs and imaging ordered   Labs unconcerning due to his elevated liver function test, do feel that this is most likely due to his EtOH intake.  Ultrasound of the right upper quadrant independently reviewed interpreted by me as being negative for any acute abnormality  CT of the abdomen/pelvis with IV contrast, I did review the radiologist report, radiologist is concerned about a transient intussusception in the left side of the abdomen.  Did  discuss this with Dr. Fanny Bien.  Unsure if further we should call surgery or GI.  Did consult with surgery  Dr. Aleen Campi states nothing surgical at this time.  However if he develops nausea, vomiting, increasing abdominal pain should return emergency department for evaluation.  Also comments that maybe we should add a lactic acid, if normal he could be discharged  I did discuss this finding with the patient.  He is agreeable to have the lab work done.  He is stating that he is now hungry.  Explained to him I do not want him to eat at this time.  We will give him fluids and pain medication.  Lactic acid is increased at 2.6, patient is currently getting fluids and pain medication  Due to the increase in lactic acid at along with the transient intussusception combined with his lymphoma we will observe him overnight, will admit for observation.  Dr. Aleen Campi to come consult on the floor,  Consult to hospitalist, Dr. Sedalia Muta is the admitting agrees to admit for observation, patient is also agreeable to admission  Patient is hemodynamically stable at this time  FINAL CLINICAL IMPRESSION(S) / ED DIAGNOSES   Final diagnoses:  Generalized abdominal pain  Chronic midline back pain, unspecified back location  Elevated liver enzymes  ETOH abuse     Rx / DC Orders   ED Discharge Orders     None        Note:  This document was prepared using Dragon voice recognition software and may include unintentional dictation errors.    Faythe Ghee, PA-C 12/28/22 1303    Sharyn Creamer, MD 12/28/22 1600

## 2022-12-28 NOTE — Assessment & Plan Note (Signed)
Presumed secondary to chronic alcohol use Counseling given Repeat liver enzymes in the a.m.

## 2022-12-28 NOTE — Assessment & Plan Note (Signed)
-   As needed nicotine patch ordered ?

## 2022-12-28 NOTE — H&P (Signed)
History and Physical   Preston [name] ZOX:[id number] DOB: 1985-08-24 DOA: 12/28/2022  PCP: Patient, No Pcp Per  Outpatient Specialists: Dr. Cathie Hoops, medical oncology Patient coming from: home  I have personally briefly reviewed patient's old medical records in [facility] EMR.  Chief Concern: back pain and right sided abdominal pain  HPI: Mr. Preston [name] is a 37 year old male with history of malignant Hodgkin's lymphoma, with left antecubital fossa Port-A-Cath, chronic low back pain, alcohol use, iron deficiency anemia, who presents emergency department for chief concerns of low back pain and right-sided abdominal pain.  Vitals in the ED showed temperature of 98.4, respiration rate of 18, heart rate 97, blood pressure 128/83, SpO2 90% on room air.  Serum sodium is 133, potassium 3.8, chloride 98, bicarb 23, BUN of 90, serum creatinine less than 5, nonfasting blood glucose 90, WBC 4.3, hemoglobin 11.7, platelets of 335.  eGFR greater than 60.  AST was elevated at 229, ALT elevated at 135.  CT abdomen pelvis with contrast: Was read as no acute findings.  Portable short segment transient intussusception in the left mid abdomen without obstruction.  Fatty liver.  Aortic atherosclerosis.  EDP discussed case with general surgery who recommends no current intervention at this time and to check a lactic acid.  Lactic acid was checked and was found to be elevated at 2.6.  ED treatment: Morphine 4 mg IV one-time dose, ondansetron 4 mg IV one-time dose, sodium chloride 1 L bolus. --------------------------- At bedside, he is able to tell me his name, age, current calendar year, current location.  He reports generalized abdominal pain that started about one week ago, worse on right lower abdomen. He denies trauma to his person. He further endorses bright red blood and dark stool for about two weeks.  He denies fever at home, nausea, swelling of his lower extremities, syncope. He endorses  blood per rectum (dark blood) and bright red blood. He has been taking iron tablets for about one week and this did turn his stool dark.   His last bowel movement was two days ago and it was more 'normal' with streaks of bright blood.  He also endorses having external hemorrhoids.  Social history: He lives with his girlfriend. He smokes 5-6 cigaretttes per day. He denies three bottles of 40 oz of beers per day. He denies IV recreational drug use. He infrequently smokes THC for increase appetite. He is sexually active, and he does not use barrier protection.   ROS: Constitutional: no weight change, no fever ENT/Mouth: no sore throat, no rhinorrhea Eyes: no eye pain, no vision changes Cardiovascular: no chest pain, no dyspnea,  no edema, no palpitations Respiratory: no cough, no sputum, no wheezing Gastrointestinal: no nausea, no vomiting, no diarrhea, no constipation Genitourinary: no urinary incontinence Musculoskeletal: no arthralgias, no myalgias Skin: no skin lesions, no pruritus, Neuro: + weakness, no loss of consciousness, no syncope Psych: no anxiety, no depression, + decrease appetite Heme/Lymph: no bruising, + bleeding (bright red blood per rectum)  ED Course: Discussed with emergency medicine provider, patient requiring hospitalization for chief concerns of pain control.  Assessment/Plan  Principal Problem:   Inadequate pain control Active Problems:   Malignant lymphoma, Hodgkin's type (HCC)   Tobacco use   Cervical stenosis of spinal canal   Port-A-Cath in place   Transaminitis   IDA (iron deficiency anemia)   Elevated lactic acid level   Alcohol use   Intussusception intestine (HCC)   Assessment and Plan:  * Inadequate pain control Multiple  pain complaints including chronic low back pain and new right sided abdominal pain Query carcinoma related pain Per oncology clinic note on 07/15/2022: Patient has chronic low back pain, was not controlled with acetaminophen,  they recommended tramadol at that time I have resumed tramadol 50 mg every 6 hours as needed for mild pain; morphine 4 mg IV every 4 hours as needed for moderate pain, 20 hours of coverage ordered; fentanyl 25 mcg IV every 4 hours as needed for severe pain, 20 hours ordered AM team to reevaluate patient at bedside for continued IV opioid pain requirements  Intussusception intestine (HCC) Of the small intestine per CT read with elevated lactic acid Query pathologic lead point within the bowel General surgery consulted via EDP, Dr. Aleen Campi  Check HIV screening  Alcohol use Patient drinks 3 bottles of 40 ounce beers per day Ativan 2 mg IV as needed for seizure, 2 doses ordered with instructions: administer as appropriate and then let provider know.  Patient has history of heavy alcohol use, if needed we will initiate CIWA precaution. Check ethanol level Counseling given for etoh cessation, patient endorses understanding and compliance.  Elevated lactic acid level Follow second lactic acid General surgery has been consulted in setting of intussusception and elevated lactic acid Additional LR 1 L bolus ordered followed by sodium chloride infusion at 150 mL/h Heart healthy at this time  IDA (iron deficiency anemia) Baseline hemoglobin over the last 9 months has been 10.1-12.4  Transaminitis Presumed secondary to chronic alcohol use Counseling given Repeat liver enzymes in the a.m.  Port-A-Cath in place Left antecubital fossa  Cervical stenosis of spinal canal With chronic left-sided numbness  Tobacco use As needed nicotine patch ordered  Chart reviewed.   DVT prophylaxis: Heparin 5000 units Code Status: full code  Diet: Heart healthy Family Communication: a phone call was offered, patient decline  Disposition Plan: Clinical course Consults called: General surgery Admission status: Telemetry medical, observation  Past Medical History:  Diagnosis Date   Alcohol abuse     Chronic low back pain    Hodgkin's lymphoma (HCC)    Stenosis of cervical spine    Left   Tobacco use    Past Surgical History:  Procedure Laterality Date   ANTERIOR CERVICAL DECOMP/DISCECTOMY FUSION N/A 10/20/2020   Procedure: ANTERIOR CERVICAL DECOMPRESSION/DISCECTOMY FUSION 1 LEVEL C5-C6;  Surgeon: Venetia Night, MD;  Location: [facility] ORS;  Service: Neurosurgery;  Laterality: N/A;   Social History:  reports that he has been smoking cigarettes. He has a 10.00 pack-year smoking history. He has never used smokeless tobacco. He reports current alcohol use of about 3.0 standard drinks of alcohol per week. He reports that he does not currently use drugs.  No Known Allergies Family History  Problem Relation Age of Onset   Heart failure Mother    Pneumonia Father    Alcohol abuse Brother    Family history: Family history reviewed and not pertinent.  Prior to Admission medications   Medication Sig Start Date End Date Taking? Authorizing Provider  acetaminophen (TYLENOL) 500 MG tablet Take 1,000 mg by mouth every 6 (six) hours as needed for mild pain or moderate pain.    [provider]  dexamethasone (DECADRON) 4 MG tablet Take 1 tablet (4 mg total) by mouth daily. Patient not taking: Reported on 12/05/2022 07/15/22   Rickard Patience, MD  ibuprofen (ADVIL) 200 MG tablet Take 600-800 mg by mouth every 6 (six) hours as needed for mild pain or moderate pain.  [provider]  iron polysaccharides (NIFEREX) 150 MG capsule Take 1 capsule (150 mg total) by mouth 2 (two) times daily. 12/06/22   Rickard Patience, MD  methocarbamol (ROBAXIN) 500 MG tablet Take 1 tablet (500 mg total) by mouth every 6 (six) hours as needed for muscle spasms. Patient not taking: Reported on 09/12/2021 10/21/20   Marinda Elk, MD  omeprazole (PRILOSEC) 20 MG capsule Take 1 capsule (20 mg total) by mouth daily. Patient not taking: Reported on 11/10/2020 09/15/20   Borders, Daryl Eastern, NP  ondansetron (ZOFRAN) 8 MG  tablet Take 1 tablet (8 mg total) by mouth 2 (two) times daily. Patient not taking: Reported on 09/12/2021 06/26/20   Rickard Patience, MD  oxyCODONE-acetaminophen (PERCOCET) 5-325 MG tablet Take 1-2 tablets by mouth every 4 (four) hours as needed for moderate pain or severe pain. Patient not taking: Reported on 12/05/2022 07/15/22   Rickard Patience, MD  prochlorperazine (COMPAZINE) 10 MG tablet Take 1 tablet (10 mg total) by mouth every 6 (six) hours as needed for nausea or vomiting. Patient not taking: Reported on 09/12/2021 06/26/20   Rickard Patience, MD  traMADol (ULTRAM) 50 MG tablet Take 1 tablet (50 mg total) by mouth every 6 (six) hours as needed. Patient not taking: Reported on 12/05/2022 07/16/22   Rickard Patience, MD   Physical Exam: Vitals:   12/28/22 0616 12/28/22 1053  BP: 123/74 128/83  Pulse: 97 97  Resp: 18 18  Temp: 98.4 F (36.9 C) 98.4 F (36.9 C)  TempSrc: Oral Oral  SpO2: 98% 98%  Weight: 63.5 kg   Height: 5\' 10"  (1.778 m)    Constitutional: appears age appropriate, frail, NAD, cachectic, calm Eyes: PERRL, lids and conjunctivae normal ENMT: Mucous membranes are moist. Posterior pharynx clear of any exudate or lesions. Age-appropriate dentition. Hearing appropriate Neck: normal, supple, no masses, no thyromegaly Respiratory: clear to auscultation bilaterally, no wheezing, no crackles. Normal respiratory effort. No accessory muscle use.  Cardiovascular: Regular rate and rhythm, no murmurs / rubs / gallops. No extremity edema. 2+ pedal pulses. No carotid bruits.  Abdomen: + Mild tenderness, no masses palpated, no hepatosplenomegaly. Bowel sounds positive.  Musculoskeletal: no clubbing / cyanosis. No joint deformity upper and lower extremities. Good ROM, no contractures, no atrophy. Normal muscle tone.  Skin: no rashes, lesions, ulcers. No induration Neurologic: Sensation intact. Strength 5/5 in all 4.  Psychiatric: Normal judgment and insight. Alert and oriented x 3. Normal mood.   EKG: Not  indicated at this time Chest x-ray on Admission: Not indicated at this time  CT L-SPINE NO CHARGE  Result Date: 12/28/2022 CLINICAL DATA:  Back pain EXAM: CT LUMBAR SPINE WITHOUT CONTRAST TECHNIQUE: Multidetector CT imaging of the lumbar spine was performed without intravenous contrast administration. Multiplanar CT image reconstructions were also generated. RADIATION DOSE REDUCTION: This exam was performed according to the departmental dose-optimization program which includes automated exposure control, adjustment of the mA and/or kV according to patient size and/or use of iterative reconstruction technique. COMPARISON:  09/23/2022 FINDINGS: Segmentation: 5 non-rib-bearing lumbar segments. Alignment: Normal Vertebrae: No fracture or focal bone lesion. Paraspinal and other soft tissues: No paraspinal mass or hematoma. See concurrent CT abdomen/pelvis. Disc levels: Well maintained throughout. No significant protrusion or other pathologic findings. IMPRESSION: Negative lumbar spine. Electronically Signed   By: Corlis Leak M.D.   On: 12/28/2022 10:49   CT ABDOMEN PELVIS W CONTRAST  Result Date: 12/28/2022 CLINICAL DATA:  Abdominal pain, acute, nonlocalized EXAM: CT ABDOMEN AND PELVIS WITH CONTRAST  TECHNIQUE: Multidetector CT imaging of the abdomen and pelvis was performed using the standard protocol following bolus administration of intravenous contrast. RADIATION DOSE REDUCTION: This exam was performed according to the departmental dose-optimization program which includes automated exposure control, adjustment of the mA and/or kV according to patient size and/or use of iterative reconstruction technique. CONTRAST:  OMNIPAQUE IOHEXOL 300 MG/ML  SOLN COMPARISON:  09/23/2022 FINDINGS: Lower chest: No pleural or pericardial effusion. Hepatobiliary: Fatty liver without focal lesion or biliary ductal dilatation. No calcified gallstones. Gallbladder nondilated. Pancreas: Unremarkable. No pancreatic ductal  dilatation or surrounding inflammatory changes. Spleen: Normal in size without focal abnormality. Adrenals/Urinary Tract: No adrenal mass. Symmetric renal parenchymal enhancement without worrisome lesion or hydronephrosis. Urinary bladder is distended. Stomach/Bowel: Stomach incompletely distended, unremarkable. Small bowel is nondilated. Target sign in the left mid abdomen involving a loop of small bowel, not present on prior study, favor short-segment transient intussusception over circumferential wall thickening. No evidence of lead mass or obstruction. Normal appendix. The colon is partially distended by gas and fecal material, without acute finding. Vascular/Lymphatic: Minimal scattered aortoiliac calcified plaque without aneurysm. Portal vein patent. Stable subcentimeter pericardial nodes. 1.1 cm low-attenuation supraceliac adenopathy stable. No new abdominal or pelvic adenopathy. Reproductive: Prostate is unremarkable. Other: Pelvic phleboliths.  No ascites.  No free air. Musculoskeletal: No acute or significant osseous findings. IMPRESSION: 1. No acute findings. 2. Probable Short-segment transient intussusception in the left mid abdomen, without obstruction. If symptoms persist, CT enterography may be useful for more definitive characterization. 3. Fatty liver. 4.  Aortic Atherosclerosis (ICD10-I70.0). Electronically Signed   By: Corlis Leak M.D.   On: 12/28/2022 10:47   US Abdomen Limited RUQ (LIVER/GB)  Result Date: 12/28/2022 CLINICAL DATA:  37 year old male with 1 week of right upper quadrant abdominal pain. History of lymphoma. EXAM: ULTRASOUND ABDOMEN LIMITED RIGHT UPPER QUADRANT COMPARISON:  CT Chest, Abdomen, and Pelvis 09/23/2022. FINDINGS: Gallbladder: No gallstones or wall thickening visualized. No sonographic Murphy sign noted by sonographer. Common bile duct: Diameter: 2 mm, normal. Liver: Echogenic liver, along with evidence of steatosis in February (image 7 today). No discrete liver lesion.  No intrahepatic biliary ductal dilatation. Portal vein is patent on color Doppler imaging with normal direction of blood flow towards the liver. Other: Negative visible right kidney.  No free fluid. IMPRESSION: 1. Hepatic steatosis. 2. Otherwise normal right upper quadrant ultrasound. Electronically Signed   By: Odessa Fleming M.D.   On: 12/28/2022 09:33    Labs on Admission: I have personally reviewed following labs  CBC: Recent Labs  Lab 12/28/22 0636  WBC 4.3  NEUTROABS 2.6  HGB 11.7*  HCT 33.6*  MCV 67.2*  PLT 335   Basic Metabolic Panel: Recent Labs  Lab 12/28/22 0636  NA 133*  K 3.8  CL 98  CO2 23  GLUCOSE 90  BUN <5*  CREATININE 0.67  CALCIUM 9.1   GFR: Estimated Creatinine Clearance: 112.4 mL/min (by C-G formula based on SCr of 0.67 mg/dL).  Liver Function Tests: Recent Labs  Lab 12/28/22 0636  AST 229*  ALT 135*  ALKPHOS 52  BILITOT 0.9  PROT 8.2*  ALBUMIN 4.7   Recent Labs  Lab 12/28/22 0634  LIPASE 61*   This document was prepared using Dragon Voice Recognition software and may include unintentional dictation errors.  Dr. Sedalia Muta [facility]  If 7PM-7AM, please contact overnight-coverage provider If 7AM-7PM, please contact day attending provider [email or web address]  12/28/2022, 3:08 PM

## 2022-12-28 NOTE — Assessment & Plan Note (Addendum)
Patient drinks 3 bottles of 40 ounce beers per day Ativan 2 mg IV as needed for seizure, 2 doses ordered with instructions: administer as appropriate and then let provider know.  Patient has history of heavy alcohol use, if needed we will initiate CIWA precaution. Check ethanol level Counseling given for etoh cessation, patient endorses understanding and compliance.

## 2022-12-28 NOTE — Assessment & Plan Note (Signed)
Left antecubital fossa

## 2022-12-28 NOTE — Consult Note (Signed)
Date of Consultation:  12/28/2022  Requesting Physician:  Greig Right, PA-C  Reason for Consultation:  Intussusception  History of Present Illness: Preston [name] is a 37 y.o. male presenting to the ED today with lower back pain.  He had back surgery two years ago and reports he's had issues with back pain since then, as well as progressing numbness in his left side going from upper extremity to lower extremity.  He also reports having abdominal pain and points towards his lower abdomen.  He is having bowel function at his baseline which is every other day bowel movement, passing flatus, and denies any nausea or vomiting.  He does have a drinking history and on presentation to the ER, his AST/ALT were elevated to 229 and 135, with lipase of 61.  WBC was normal to 4.3, but his lactic acid was elevated to 2.6.  He had a CT scan of abdomen and pelvis which showed a short segment intussusception of the small bowel, without any evidence of obstruction.  U/S of the RUQ did not show any cholelithiasis, and CT of the lumbar spine did not show any abnormalities.  He's scheduled for MRI of the cervical and thoracic spine on 01/02/23.  He has a history of lymphoma and is followed by Dr. Cathie Hoops.  He reports his last chemotherapy was around 8 months ago and he's been doing well.    Past Medical History: Past Medical History:  Diagnosis Date   Alcohol abuse    Chronic low back pain    Hodgkin's lymphoma (HCC)    Stenosis of cervical spine    Left   Tobacco use      Past Surgical History: Past Surgical History:  Procedure Laterality Date   ANTERIOR CERVICAL DECOMP/DISCECTOMY FUSION N/A 10/20/2020   Procedure: ANTERIOR CERVICAL DECOMPRESSION/DISCECTOMY FUSION 1 LEVEL C5-C6;  Surgeon: Venetia Night, MD;  Location: [facility] ORS;  Service: Neurosurgery;  Laterality: N/A;    Home Medications: Prior to Admission medications   Medication Sig Start Date End Date Taking? Authorizing Provider  acetaminophen  (TYLENOL) 500 MG tablet Take 1,000 mg by mouth every 6 (six) hours as needed for mild pain or moderate pain.   Yes [provider]  ibuprofen (ADVIL) 200 MG tablet Take 600-800 mg by mouth every 6 (six) hours as needed for mild pain or moderate pain.   Yes [provider]  iron polysaccharides (NIFEREX) 150 MG capsule Take 1 capsule (150 mg total) by mouth 2 (two) times daily. 12/06/22  Yes Rickard Patience, MD  dexamethasone (DECADRON) 4 MG tablet Take 1 tablet (4 mg total) by mouth daily. Patient not taking: Reported on 12/05/2022 07/15/22   Rickard Patience, MD  methocarbamol (ROBAXIN) 500 MG tablet Take 1 tablet (500 mg total) by mouth every 6 (six) hours as needed for muscle spasms. Patient not taking: Reported on 09/12/2021 10/21/20   Marinda Elk, MD  omeprazole (PRILOSEC) 20 MG capsule Take 1 capsule (20 mg total) by mouth daily. Patient not taking: Reported on 11/10/2020 09/15/20   Borders, Daryl Eastern, NP  ondansetron (ZOFRAN) 8 MG tablet Take 1 tablet (8 mg total) by mouth 2 (two) times daily. Patient not taking: Reported on 09/12/2021 06/26/20   Rickard Patience, MD  oxyCODONE-acetaminophen (PERCOCET) 5-325 MG tablet Take 1-2 tablets by mouth every 4 (four) hours as needed for moderate pain or severe pain. Patient not taking: Reported on 12/05/2022 07/15/22   Rickard Patience, MD  prochlorperazine (COMPAZINE) 10 MG tablet Take 1 tablet (10 mg  total) by mouth every 6 (six) hours as needed for nausea or vomiting. Patient not taking: Reported on 09/12/2021 06/26/20   Rickard Patience, MD  traMADol (ULTRAM) 50 MG tablet Take 1 tablet (50 mg total) by mouth every 6 (six) hours as needed. Patient not taking: Reported on 12/05/2022 07/16/22   Rickard Patience, MD    Allergies: No Known Allergies  Social History:  reports that he has been smoking cigarettes. He has a 10.00 pack-year smoking history. He has never used smokeless tobacco. He reports current alcohol use. He reports that he does not currently use drugs.   Family  History: Family History  Problem Relation Age of Onset   Heart failure Mother    Pneumonia Father     Review of Systems: Review of Systems  Constitutional:  Negative for chills and fever.  HENT:  Negative for hearing loss.   Respiratory:  Negative for shortness of breath.   Cardiovascular:  Negative for chest pain.  Gastrointestinal:  Positive for abdominal pain. Negative for constipation, diarrhea, nausea and vomiting.  Genitourinary:  Negative for dysuria.  Musculoskeletal:  Positive for back pain.  Skin:  Negative for rash.  Neurological:  Positive for sensory change.  Psychiatric/Behavioral:  Negative for depression.     Physical Exam BP 128/83 (BP Location: Right Arm)   Pulse 97   Temp 98.4 F (36.9 C) (Oral)   Resp 18   Ht 5\' 10"  (1.778 m)   Wt 63.5 kg   SpO2 98%   BMI 20.09 kg/m  CONSTITUTIONAL: No acute distress, well nourished. HEENT:  Normocephalic, atraumatic, extraocular motion intact. NECK: Trachea is midline, and there is no jugular venous distension. RESPIRATORY:  Normal respiratory effort without pathologic use of accessory muscles. CARDIOVASCULAR: Regular rhythm and rate. GI: The abdomen is soft, non-distended, with some mild tenderness in the lower abdomen, right more than left.  No pain in the left upper quadrant.  No peritonitis.  MUSCULOSKELETAL:  Normal muscle strength and tone in all four extremities.  No peripheral edema or cyanosis. SKIN: Skin turgor is normal. There are no pathologic skin lesions.  NEUROLOGIC:  Cranial nerves are grossly intact. PSYCH:  Alert and oriented to person, place and time. Affect is normal.  Laboratory Analysis: Results for orders placed or performed during the hospital encounter of 12/28/22 (from the past 24 hour(s))  Lipase, blood     Status: Abnormal   Collection Time: 12/28/22  6:34 AM  Result Value Ref Range   Lipase 61 (H) 11 - 51 U/L  CBC with Differential     Status: Abnormal   Collection Time: 12/28/22   6:36 AM  Result Value Ref Range   WBC 4.3 4.0 - 10.5 K/uL   RBC 5.00 4.22 - 5.81 MIL/uL   Hemoglobin 11.7 (L) 13.0 - 17.0 g/dL   HCT 40.9 (L) 81.1 - 91.4 %   MCV 67.2 (L) 80.0 - 100.0 fL   MCH 23.4 (L) 26.0 - 34.0 pg   MCHC 34.8 30.0 - 36.0 g/dL   RDW 78.2 (H) 95.6 - 21.3 %   Platelets 335 150 - 400 K/uL   nRBC 0.0 0.0 - 0.2 %   Neutrophils Relative % 60 %   Neutro Abs 2.6 1.7 - 7.7 K/uL   Lymphocytes Relative 23 %   Lymphs Abs 1.0 0.7 - 4.0 K/uL   Monocytes Relative 15 %   Monocytes Absolute 0.6 0.1 - 1.0 K/uL   Eosinophils Relative 1 %   Eosinophils Absolute 0.0 0.0 -  0.5 K/uL   Basophils Relative 1 %   Basophils Absolute 0.0 0.0 - 0.1 K/uL   WBC Morphology MORPHOLOGY UNREMARKABLE    Smear Review Normal platelet morphology    Immature Granulocytes 0 %   Abs Immature Granulocytes 0.01 0.00 - 0.07 K/uL   Target Cells PRESENT   Comprehensive metabolic panel     Status: Abnormal   Collection Time: 12/28/22  6:36 AM  Result Value Ref Range   Sodium 133 (L) 135 - 145 mmol/L   Potassium 3.8 3.5 - 5.1 mmol/L   Chloride 98 98 - 111 mmol/L   CO2 23 22 - 32 mmol/L   Glucose, Bld 90 70 - 99 mg/dL   BUN <5 (L) 6 - 20 mg/dL   Creatinine, Ser 4.09 0.61 - 1.24 mg/dL   Calcium 9.1 8.9 - 81.1 mg/dL   Total Protein 8.2 (H) 6.5 - 8.1 g/dL   Albumin 4.7 3.5 - 5.0 g/dL   AST 914 (H) 15 - 41 U/L   ALT 135 (H) 0 - 44 U/L   Alkaline Phosphatase 52 38 - 126 U/L   Total Bilirubin 0.9 0.3 - 1.2 mg/dL   GFR, Estimated >78 >29 mL/min   Anion gap 12 5 - 15  Lactic acid, plasma     Status: Abnormal   Collection Time: 12/28/22 11:52 AM  Result Value Ref Range   Lactic Acid, Venous 2.6 (HH) 0.5 - 1.9 mmol/L    Imaging: CT L-SPINE NO CHARGE  Result Date: 12/28/2022 CLINICAL DATA:  Back pain EXAM: CT LUMBAR SPINE WITHOUT CONTRAST TECHNIQUE: Multidetector CT imaging of the lumbar spine was performed without intravenous contrast administration. Multiplanar CT image reconstructions were also  generated. RADIATION DOSE REDUCTION: This exam was performed according to the departmental dose-optimization program which includes automated exposure control, adjustment of the mA and/or kV according to patient size and/or use of iterative reconstruction technique. COMPARISON:  09/23/2022 FINDINGS: Segmentation: 5 non-rib-bearing lumbar segments. Alignment: Normal Vertebrae: No fracture or focal bone lesion. Paraspinal and other soft tissues: No paraspinal mass or hematoma. See concurrent CT abdomen/pelvis. Disc levels: Well maintained throughout. No significant protrusion or other pathologic findings. IMPRESSION: Negative lumbar spine. Electronically Signed   By: Corlis Leak M.D.   On: 12/28/2022 10:49   CT ABDOMEN PELVIS W CONTRAST  Result Date: 12/28/2022 CLINICAL DATA:  Abdominal pain, acute, nonlocalized EXAM: CT ABDOMEN AND PELVIS WITH CONTRAST TECHNIQUE: Multidetector CT imaging of the abdomen and pelvis was performed using the standard protocol following bolus administration of intravenous contrast. RADIATION DOSE REDUCTION: This exam was performed according to the departmental dose-optimization program which includes automated exposure control, adjustment of the mA and/or kV according to patient size and/or use of iterative reconstruction technique. CONTRAST:  OMNIPAQUE IOHEXOL 300 MG/ML  SOLN COMPARISON:  09/23/2022 FINDINGS: Lower chest: No pleural or pericardial effusion. Hepatobiliary: Fatty liver without focal lesion or biliary ductal dilatation. No calcified gallstones. Gallbladder nondilated. Pancreas: Unremarkable. No pancreatic ductal dilatation or surrounding inflammatory changes. Spleen: Normal in size without focal abnormality. Adrenals/Urinary Tract: No adrenal mass. Symmetric renal parenchymal enhancement without worrisome lesion or hydronephrosis. Urinary bladder is distended. Stomach/Bowel: Stomach incompletely distended, unremarkable. Small bowel is nondilated. Target sign in the  left mid abdomen involving a loop of small bowel, not present on prior study, favor short-segment transient intussusception over circumferential wall thickening. No evidence of lead mass or obstruction. Normal appendix. The colon is partially distended by gas and fecal material, without acute finding. Vascular/Lymphatic: Minimal scattered aortoiliac  calcified plaque without aneurysm. Portal vein patent. Stable subcentimeter pericardial nodes. 1.1 cm low-attenuation supraceliac adenopathy stable. No new abdominal or pelvic adenopathy. Reproductive: Prostate is unremarkable. Other: Pelvic phleboliths.  No ascites.  No free air. Musculoskeletal: No acute or significant osseous findings. IMPRESSION: 1. No acute findings. 2. Probable Short-segment transient intussusception in the left mid abdomen, without obstruction. If symptoms persist, CT enterography may be useful for more definitive characterization. 3. Fatty liver. 4.  Aortic Atherosclerosis (ICD10-I70.0). Electronically Signed   By: Corlis Leak M.D.   On: 12/28/2022 10:47   US Abdomen Limited RUQ (LIVER/GB)  Result Date: 12/28/2022 CLINICAL DATA:  37 year old male with 1 week of right upper quadrant abdominal pain. History of lymphoma. EXAM: ULTRASOUND ABDOMEN LIMITED RIGHT UPPER QUADRANT COMPARISON:  CT Chest, Abdomen, and Pelvis 09/23/2022. FINDINGS: Gallbladder: No gallstones or wall thickening visualized. No sonographic Murphy sign noted by sonographer. Common bile duct: Diameter: 2 mm, normal. Liver: Echogenic liver, along with evidence of steatosis in February (image 7 today). No discrete liver lesion. No intrahepatic biliary ductal dilatation. Portal vein is patent on color Doppler imaging with normal direction of blood flow towards the liver. Other: Negative visible right kidney.  No free fluid. IMPRESSION: 1. Hepatic steatosis. 2. Otherwise normal right upper quadrant ultrasound. Electronically Signed   By: Odessa Fleming M.D.   On: 12/28/2022 09:33     Assessment and Plan: This is a 37 y.o. male with intussusception  -- Discussed with patient the findings on his laboratory studies as well as CT scan.  He does have a small area of intussusception but there is no surrounding fat stranding or evidence of obstruction.  He has been tolerating a p.o. diet and has had 2 meals here in the emergency room.  His lactic acid has remained stable and he has been receiving IV fluids.  At this point he does not appear peritoneal and although his abdominal exam is not fully benign, the area of tenderness is not at the point where his intussusception is located.  Discussed with him that for now we could potentially do watchful waiting and closely monitor his abdominal exam.  There is any worsening in his clinical status or abdominal status, then discussed with him the potential for either repeat CT scan versus taking to the operating room for diagnostic laparoscopy.  He is in agreement with this plan.  For now it is okay to continue with diet as tolerated.  No antibiotics needed at this point.  Continue monitoring his lactic acid.  I spent 60 minutes dedicated to the care of this patient on the date of this encounter to include pre-visit review of records, face-to-face time with the patient discussing diagnosis and management, and any post-visit coordination of care.   Howie Ill, MD [facility] Pg:  [phone]

## 2022-12-28 NOTE — Assessment & Plan Note (Addendum)
Multiple pain complaints including chronic low back pain and new right sided abdominal pain Query carcinoma related pain Per oncology clinic note on 07/15/2022: Patient has chronic low back pain, was not controlled with acetaminophen, they recommended tramadol at that time I have resumed tramadol 50 mg every 6 hours as needed for mild pain; morphine 4 mg IV every 4 hours as needed for moderate pain, 20 hours of coverage ordered; fentanyl 25 mcg IV every 4 hours as needed for severe pain, 20 hours ordered AM team to reevaluate patient at bedside for continued IV opioid pain requirements

## 2022-12-28 NOTE — Assessment & Plan Note (Addendum)
Of the small intestine per CT read with elevated lactic acid Query pathologic lead point within the bowel General surgery consulted via EDP, Dr. Aleen Campi  Check HIV screening

## 2022-12-28 NOTE — Assessment & Plan Note (Signed)
Follow second lactic acid General surgery has been consulted in setting of intussusception and elevated lactic acid Additional LR 1 L bolus ordered followed by sodium chloride infusion at 150 mL/h Heart healthy at this time

## 2022-12-28 NOTE — Assessment & Plan Note (Signed)
Baseline hemoglobin over the last 9 months has been 10.1-12.4

## 2022-12-28 NOTE — Assessment & Plan Note (Addendum)
With chronic left-sided numbness

## 2022-12-28 NOTE — ED Notes (Signed)
Pt to be transported to room via wheelchair. Stable at time of departure 

## 2022-12-29 ENCOUNTER — Observation Stay: Payer: Medicaid Other

## 2022-12-29 DIAGNOSIS — Z789 Other specified health status: Secondary | ICD-10-CM

## 2022-12-29 DIAGNOSIS — G8929 Other chronic pain: Secondary | ICD-10-CM

## 2022-12-29 DIAGNOSIS — M549 Dorsalgia, unspecified: Secondary | ICD-10-CM | POA: Diagnosis not present

## 2022-12-29 DIAGNOSIS — K561 Intussusception: Secondary | ICD-10-CM | POA: Diagnosis not present

## 2022-12-29 DIAGNOSIS — R1084 Generalized abdominal pain: Secondary | ICD-10-CM | POA: Diagnosis not present

## 2022-12-29 DIAGNOSIS — R748 Abnormal levels of other serum enzymes: Secondary | ICD-10-CM

## 2022-12-29 LAB — HEPATIC FUNCTION PANEL
ALT: 112 U/L — ABNORMAL HIGH (ref 0–44)
AST: 128 U/L — ABNORMAL HIGH (ref 15–41)
Albumin: 3.9 g/dL (ref 3.5–5.0)
Alkaline Phosphatase: 49 U/L (ref 38–126)
Bilirubin, Direct: 0.2 mg/dL (ref 0.0–0.2)
Indirect Bilirubin: 1.5 mg/dL — ABNORMAL HIGH (ref 0.3–0.9)
Total Bilirubin: 1.7 mg/dL — ABNORMAL HIGH (ref 0.3–1.2)
Total Protein: 6.7 g/dL (ref 6.5–8.1)

## 2022-12-29 LAB — BASIC METABOLIC PANEL
Anion gap: 7 (ref 5–15)
BUN: 6 mg/dL (ref 6–20)
CO2: 27 mmol/L (ref 22–32)
Calcium: 9.1 mg/dL (ref 8.9–10.3)
Chloride: 100 mmol/L (ref 98–111)
Creatinine, Ser: 0.67 mg/dL (ref 0.61–1.24)
GFR, Estimated: >60 mL/min (ref >60)
Glucose, Bld: 79 mg/dL (ref 70–99)
Potassium: 3.8 mmol/L (ref 3.5–5.1)
Sodium: 134 mmol/L — ABNORMAL LOW (ref 135–145)

## 2022-12-29 LAB — CBC
HCT: 27.4 % — ABNORMAL LOW (ref 39.0–52.0)
Hemoglobin: 9.4 g/dL — ABNORMAL LOW (ref 13.0–17.0)
MCH: 23.3 pg — ABNORMAL LOW (ref 26.0–34.0)
MCHC: 34.3 g/dL (ref 30.0–36.0)
MCV: 67.8 fL — ABNORMAL LOW (ref 80.0–100.0)
Platelets: 246 10*3/uL (ref 150–400)
RBC: 4.04 MIL/uL — ABNORMAL LOW (ref 4.22–5.81)
RDW: 20.5 % — ABNORMAL HIGH (ref 11.5–15.5)
WBC: 4.9 10*3/uL (ref 4.0–10.5)
nRBC: 0 % (ref 0.0–0.2)

## 2022-12-29 LAB — HIV ANTIBODY (ROUTINE TESTING W REFLEX): HIV Screen 4th Generation wRfx: NONREACTIVE

## 2022-12-29 LAB — LACTIC ACID, PLASMA: Lactic Acid, Venous: 1.1 mmol/L (ref 0.5–1.9)

## 2022-12-29 MED ORDER — DOCUSATE SODIUM 100 MG PO CAPS: 100.0000 mg | ORAL_CAPSULE | Freq: Two times a day (BID) | ORAL | 0 refills | Status: DC

## 2022-12-29 MED ORDER — MORPHINE SULFATE (PF) 2 MG/ML IV SOLN
2.0000 mg | Freq: Four times a day (QID) | INTRAVENOUS | Status: DC | PRN
  Administered 2022-12-29: 2 mg via INTRAVENOUS
  Filled 2022-12-29: qty 1

## 2022-12-29 MED ORDER — POLYETHYLENE GLYCOL 3350 17 G PO PACK
17.0000 g | PACK | Freq: Every day | ORAL | Status: DC
  Administered 2022-12-29: 17 g via ORAL
  Filled 2022-12-29: qty 1

## 2022-12-29 MED ORDER — DOCUSATE SODIUM 100 MG PO CAPS
100.0000 mg | ORAL_CAPSULE | Freq: Two times a day (BID) | ORAL | Status: DC
  Administered 2022-12-29: 100 mg via ORAL
  Filled 2022-12-29: qty 1

## 2022-12-29 MED ORDER — NICOTINE 21 MG/24HR TD PT24: 21.0000 mg | MEDICATED_PATCH | Freq: Every day | TRANSDERMAL | 0 refills | Status: DC | PRN

## 2022-12-29 MED ORDER — OXYCODONE-ACETAMINOPHEN 5-325 MG PO TABS: 1.0000 | ORAL_TABLET | ORAL | Status: DC | PRN

## 2022-12-29 MED ORDER — OXYCODONE-ACETAMINOPHEN 5-325 MG PO TABS
1.0000 | ORAL_TABLET | Freq: Four times a day (QID) | ORAL | Status: DC | PRN
  Administered 2022-12-29: 2 via ORAL
  Filled 2022-12-29: qty 2

## 2022-12-29 MED ORDER — POLYETHYLENE GLYCOL 3350 17 G PO PACK: 17.0000 g | PACK | Freq: Every day | ORAL | 0 refills | Status: DC

## 2022-12-29 MED ORDER — OXYCODONE-ACETAMINOPHEN 5-325 MG PO TABS: 1.0000 | ORAL_TABLET | Freq: Three times a day (TID) | ORAL | 0 refills | Status: DC | PRN

## 2022-12-29 NOTE — Discharge Summary (Signed)
Physician Discharge Summary   Patient: Preston [name] MRN: [id number] DOB: 04-25-1986  Admit date:     12/28/2022  Discharge date: 12/29/22  Discharge Physician: Preston [name]   PCP: Patient, No Pcp Per   Recommendations at discharge:    F/u Preston [name] on your next appointment F/u Preston [name] in 1-2 weeks after your MRI is done Keep your MRI appt for 01/02/23  Discharge Diagnoses: Principal Problem:   Inadequate pain control Active Problems:   Malignant lymphoma, Hodgkin's type (HCC)   Tobacco use   Cervical stenosis of spinal canal   Port-A-Cath in place   Transaminitis   IDA (iron deficiency anemia)   Elevated lactic acid level   Alcohol use   Intussusception intestine (HCC)  Preston [name] is a 37 year old male with history of malignant Hodgkin's lymphoma, with left antecubital fossa Port-A-Cath, chronic low back pain, alcohol use, iron deficiency anemia, who presents emergency department for chief concerns of low back pain and right-sided abdominal pain.  CT abdomen pelvis with contrast: Was read as no acute findings. Portable short segment transient intussusception in the left mid abdomen without obstruction. Fatty liver. Aortic atherosclerosis.   Abdominal pain lower -- etiology unclear. -- CT scan results as above noted. -- Patient seen by the general surgery Preston [name]. Does not appear to be anything acute at present. Patient's labs are normal lactic acid normal. Repeat KUB normal gas pattern. Patient able to tolerated PO diet. No nausea vomiting. -- Received IV fluids. --Discussed narcotics will make it will worst given side effect of ileus. Will stop IV pain meds.  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 He voiced understanding  Constipation intermittent possible hemorrhoids -- patient recommended to use MiraLAX and docket set at home -- I recommended him to use Preparation H which he  has at home. -- Encourage fluid intake and fruits vegetables  Lower back pain chronic -- patient has MRI of lower back scheduled for March 23 as outpatient. I did recommend him to see neurosurgery Preston [name] who has operated on him before. -- PRN Percocet   History of Hodgkin's lymphoma -- patient follows with Preston. Cathie [name] as outpatient  Overall hemodynamically stable. Discharge plan discussed with patient he is in agreement. Preston [name] is in agreement as well.     Pain control - Preston [name] Controlled Substance Reporting System database was reviewed. and patient was instructed, not to drive, operate heavy machinery, perform activities at heights, swimming or participation in water activities or provide baby-sitting services while on Pain, Sleep and Anxiety Medications; until their outpatient Physician has advised to do so again. Also recommended to not to take more than prescribed Pain, Sleep and Anxiety Medications.  Consultants: neurosurgery  Discharge Diet Orders (From admission, onward)     Start     Ordered   12/29/22 0000  Diet - low sodium heart healthy        12/29/22 1304   12/29/22 0000  Diet - low sodium heart healthy        12/29/22 1334            DISCHARGE MEDICATION: Allergies as of 12/29/2022   No Known Allergies      Medication List     STOP taking these medications    methocarbamol 500 MG tablet Commonly known as: ROBAXIN   omeprazole 20 MG capsule Commonly known as: PRILOSEC   ondansetron 8 MG tablet Commonly known as: ZOFRAN  prochlorperazine 10 MG tablet Commonly known as: COMPAZINE   traMADol 50 MG tablet Commonly known as: ULTRAM       TAKE these medications    acetaminophen 500 MG tablet Commonly known as: TYLENOL Take 1,000 mg by mouth every 6 (six) hours as needed for mild pain or moderate pain.   docusate sodium 100 MG capsule Commonly known as: COLACE Take 1 capsule (100 mg total) by mouth 2 (two) times daily.    ibuprofen 200 MG tablet Commonly known as: ADVIL Take 600-800 mg by mouth every 6 (six) hours as needed for mild pain or moderate pain.   iron polysaccharides 150 MG capsule Commonly known as: NIFEREX Take 1 capsule (150 mg total) by mouth 2 (two) times daily.   nicotine 21 mg/24hr patch Commonly known as: NICODERM CQ - dosed in mg/24 hours Place 1 patch (21 mg total) onto the skin daily as needed (Nicotine craving).   oxyCODONE-acetaminophen 5-325 MG tablet Commonly known as: Percocet Take 1 tablet by mouth every 8 (eight) hours as needed for moderate pain or severe pain. What changed:  how much to take when to take this   polyethylene glycol 17 g packet Commonly known as: MIRALAX / GLYCOLAX Take 17 g by mouth daily.        Follow-up Information     Preston Patience, MD. Go to.   Specialty: Oncology Why: on your next appt Contact information: [address] [place] [address] [phone]         Preston Night, MD. Schedule an appointment as soon as possible for a visit in 1 week(s).   Specialty: Neurosurgery Why: for your back pain after you get your MRI done next week Contact information: [address] [place] [address] [phone]                Discharge Exam: Preston [name] Weights   12/28/22 0616  Weight: 63.5 kg   Alert and oriented times three respiratory clear to auscultation cardiovascular both heart sounds normal no murmur abdomen soft benign no guarding rigidity   Condition at discharge: fair  The results of significant diagnostics from this hospitalization (including imaging, microbiology, ancillary and laboratory) are listed below for reference.   Imaging Studies: CT L-SPINE NO CHARGE  Result Date: 12/28/2022 CLINICAL DATA:  Back pain EXAM: CT LUMBAR SPINE WITHOUT CONTRAST TECHNIQUE: Multidetector CT imaging of the lumbar spine was performed without intravenous contrast administration. Multiplanar CT  image reconstructions were also generated. RADIATION DOSE REDUCTION: This exam was performed according to the departmental dose-optimization program which includes automated exposure control, adjustment of the mA and/or kV according to patient size and/or use of iterative reconstruction technique. COMPARISON:  09/23/2022 FINDINGS: Segmentation: 5 non-rib-bearing lumbar segments. Alignment: Normal Vertebrae: No fracture or focal bone lesion. Paraspinal and other soft tissues: No paraspinal mass or hematoma. See concurrent CT abdomen/pelvis. Disc levels: Well maintained throughout. No significant protrusion or other pathologic findings. IMPRESSION: Negative lumbar spine. Electronically Signed   By: Corlis Leak M.D.   On: 12/28/2022 10:49   CT ABDOMEN PELVIS W CONTRAST  Result Date: 12/28/2022 CLINICAL DATA:  Abdominal pain, acute, nonlocalized EXAM: CT ABDOMEN AND PELVIS WITH CONTRAST TECHNIQUE: Multidetector CT imaging of the abdomen and pelvis was performed using the standard protocol following bolus administration of intravenous contrast. RADIATION DOSE REDUCTION: This exam was performed according to the departmental dose-optimization program which includes automated exposure control, adjustment of the mA and/or kV according to patient size and/or use  of iterative reconstruction technique. CONTRAST:  OMNIPAQUE IOHEXOL 300 MG/ML  SOLN COMPARISON:  09/23/2022 FINDINGS: Lower chest: No pleural or pericardial effusion. Hepatobiliary: Fatty liver without focal lesion or biliary ductal dilatation. No calcified gallstones. Gallbladder nondilated. Pancreas: Unremarkable. No pancreatic ductal dilatation or surrounding inflammatory changes. Spleen: Normal in size without focal abnormality. Adrenals/Urinary Tract: No adrenal mass. Symmetric renal parenchymal enhancement without worrisome lesion or hydronephrosis. Urinary bladder is distended. Stomach/Bowel: Stomach incompletely distended, unremarkable. Small bowel is  nondilated. Target sign in the left mid abdomen involving a loop of small bowel, not present on prior study, favor short-segment transient intussusception over circumferential wall thickening. No evidence of lead mass or obstruction. Normal appendix. The colon is partially distended by gas and fecal material, without acute finding. Vascular/Lymphatic: Minimal scattered aortoiliac calcified plaque without aneurysm. Portal vein patent. Stable subcentimeter pericardial nodes. 1.1 cm low-attenuation supraceliac adenopathy stable. No new abdominal or pelvic adenopathy. Reproductive: Prostate is unremarkable. Other: Pelvic phleboliths.  No ascites.  No free air. Musculoskeletal: No acute or significant osseous findings. IMPRESSION: 1. No acute findings. 2. Probable Short-segment transient intussusception in the left mid abdomen, without obstruction. If symptoms persist, CT enterography may be useful for more definitive characterization. 3. Fatty liver. 4.  Aortic Atherosclerosis (ICD10-I70.0). Electronically Signed   By: Corlis Leak M.D.   On: 12/28/2022 10:47   US Abdomen Limited RUQ (LIVER/GB)  Result Date: 12/28/2022 CLINICAL DATA:  37 year old male with 1 week of right upper quadrant abdominal pain. History of lymphoma. EXAM: ULTRASOUND ABDOMEN LIMITED RIGHT UPPER QUADRANT COMPARISON:  CT Chest, Abdomen, and Pelvis 09/23/2022. FINDINGS: Gallbladder: No gallstones or wall thickening visualized. No sonographic Murphy sign noted by sonographer. Common bile duct: Diameter: 2 mm, normal. Liver: Echogenic liver, along with evidence of steatosis in February (image 7 today). No discrete liver lesion. No intrahepatic biliary ductal dilatation. Portal vein is patent on color Doppler imaging with normal direction of blood flow towards the liver. Other: Negative visible right kidney.  No free fluid. IMPRESSION: 1. Hepatic steatosis. 2. Otherwise normal right upper quadrant ultrasound. Electronically Signed   By: Odessa Fleming M.D.    On: 12/28/2022 09:33    Microbiology: Results for orders placed or performed during the hospital encounter of 10/17/20  Resp Panel by RT-PCR (Flu A&B, Covid) Nasopharyngeal Swab     Status: None   Collection Time: 10/18/20 12:54 AM   Specimen: Nasopharyngeal Swab; Nasopharyngeal(NP) swabs in vial transport medium  Result Value Ref Range Status   SARS Coronavirus 2 by RT PCR NEGATIVE NEGATIVE Final    Comment: (NOTE) SARS-CoV-2 target nucleic acids are NOT DETECTED.  The SARS-CoV-2 RNA is generally detectable in upper respiratory specimens during the acute phase of infection. The lowest concentration of SARS-CoV-2 viral copies this assay can detect is 138 copies/mL. A negative result does not preclude SARS-Cov-2 infection and should not be used as the sole basis for treatment or other patient management decisions. A negative result may occur with  improper specimen collection/handling, submission of specimen other than nasopharyngeal swab, presence of viral mutation(s) within the areas targeted by this assay, and inadequate number of viral copies(<138 copies/mL). A negative result must be combined with clinical observations, patient history, and epidemiological information. The expected result is Negative.  Fact Sheet for Patients:  BloggerCourse.com  Fact Sheet for Healthcare Providers:  SeriousBroker.it  This test is no t yet approved or cleared by the [place] FDA and  has been authorized for detection and/or diagnosis of SARS-CoV-2 by  FDA under an Emergency Use Authorization (EUA). This EUA will remain  in effect (meaning this test can be used) for the duration of the COVID-19 declaration under Section 564(b)(1) of the Act, 21 U.S.C.section 360bbb-3(b)(1), unless the authorization is terminated  or revoked sooner.       Influenza A by PCR NEGATIVE NEGATIVE Final   Influenza B by PCR NEGATIVE NEGATIVE Final    Comment:  (NOTE) The Xpert Xpress SARS-CoV-2/FLU/RSV plus assay is intended as an aid in the diagnosis of influenza from Nasopharyngeal swab specimens and should not be used as a sole basis for treatment. Nasal washings and aspirates are unacceptable for Xpert Xpress SARS-CoV-2/FLU/RSV testing.  Fact Sheet for Patients: BloggerCourse.com  Fact Sheet for Healthcare Providers: SeriousBroker.it  This test is not yet approved or cleared by the [place] FDA and has been authorized for detection and/or diagnosis of SARS-CoV-2 by FDA under an Emergency Use Authorization (EUA). This EUA will remain in effect (meaning this test can be used) for the duration of the COVID-19 declaration under Section 564(b)(1) of the Act, 21 U.S.C. section 360bbb-3(b)(1), unless the authorization is terminated or revoked.  Performed at [facility], [address] [place], [place] [address]     Labs: CBC: Recent Labs  Lab 12/28/22 0636 12/29/22 0539  WBC 4.3 4.9  NEUTROABS 2.6  --   HGB 11.7* 9.4*  HCT 33.6* 27.4*  MCV 67.2* 67.8*  PLT 335 246   Basic Metabolic Panel: Recent Labs  Lab 12/28/22 0636 12/29/22 0539  NA 133* 134*  K 3.8 3.8  CL 98 100  CO2 23 27  GLUCOSE 90 79  BUN <5* 6  CREATININE 0.67 0.67  CALCIUM 9.1 9.1   Liver Function Tests: Recent Labs  Lab 12/28/22 0636 12/29/22 0539  AST 229* 128*  ALT 135* 112*  ALKPHOS 52 49  BILITOT 0.9 1.7*  PROT 8.2* 6.7  ALBUMIN 4.7 3.9   CBG: No results for input(s): "GLUCAP" in the last 168 hours.  Discharge time spent: greater than 30 minutes.  Signed: Enedina Finner, MD [facility] 12/29/2022

## 2022-12-29 NOTE — Discharge Instructions (Signed)
Use Preparation-H for your Hemorrhoids

## 2022-12-29 NOTE — Progress Notes (Signed)
12/29/2022  Subjective: No acute events overnight.  Patient reports this morning having a pain about 7 out of 10 still more than anything in the lower abdomen.  White blood cell count remains normal at 4.9.  Lactic acid this morning has normalized to 1.1 and KUB this morning to me shows a normal gas pattern without any evidence of obstruction.  Vital signs: Temp:  [98 F (36.7 C)-98.7 F (37.1 C)] 98.7 F (37.1 C) (05/19 0751) Pulse Rate:  [67-93] 71 (05/19 0751) Resp:  [15-21] 15 (05/19 0751) BP: (130-153)/(83-98) 140/92 (05/19 0751) SpO2:  [99 %-100 %] 100 % (05/19 0751)   Intake/Output: 05/18 0701 - 05/19 0700 In: 3355.7 [P.O.:480; I.V.:875.7; IV Piggyback:2000] Out: 1350 [Urine:1350] Last BM Date : 12/28/22 (per pt)  Physical Exam: Constitutional: No acute distress Abdomen: Soft, nondistended, with some tenderness to palpation in the lower abdomen.  No peritonitis.  Labs:  Recent Labs    12/28/22 0636 12/29/22 0539  WBC 4.3 4.9  HGB 11.7* 9.4*  HCT 33.6* 27.4*  PLT 335 246   Recent Labs    12/28/22 0636 12/29/22 0539  NA 133* 134*  K 3.8 3.8  CL 98 100  CO2 23 27  GLUCOSE 90 79  BUN <5* 6  CREATININE 0.67 0.67  CALCIUM 9.1 9.1   No results for input(s): "LABPROT", "INR" in the last 72 hours.  Imaging: No results found.  Assessment/Plan: This is a 37 y.o. male with abdominal pain and intussusception on imaging.  - Discussed with patient that if the intussusception were causing actual issues, he would be having worsening abdominal pain, not able to tolerate p.o. diet, no flatus, or worsening labs.  However he has been eating a regular food without issues, passing flatus, without any distention, and his lactic acid has normalized now to 1.1.  KUB does not show any evidence of obstruction either so at this point I do not think there is anything surgical in nature.  Cannot explain the true issue with his abdominal pain particular given that is in different  locations.  Could potentially be related to something he ate versus his drinking. - Patient can continue with his regular diet.  No surgical needs at this point.  Could potentially be discharged home if he is feeling well.   I spent 35 minutes dedicated to the care of this patient on the date of this encounter to include pre-visit review of records, face-to-face time with the patient discussing diagnosis and management, and any post-visit coordination of care.  Howie Ill, MD [facility]

## 2022-12-29 NOTE — Plan of Care (Signed)

## 2022-12-30 ENCOUNTER — Inpatient Hospital Stay: Payer: Medicaid Other

## 2022-12-31 ENCOUNTER — Inpatient Hospital Stay: Payer: Medicaid Other

## 2023-01-02 ENCOUNTER — Ambulatory Visit
Admission: RE | Admit: 2023-01-02 | Discharge: 2023-01-02 | Disposition: A | Discharge status: Home / Self Care | Payer: Medicaid Other | Source: Ambulatory Visit | Attending: Oncology | Admitting: Oncology

## 2023-01-02 DIAGNOSIS — R200 Anesthesia of skin: Secondary | ICD-10-CM | POA: Insufficient documentation

## 2023-01-02 MED ORDER — GADOBUTROL 1 MMOL/ML IV SOLN
6.0000 mL | Freq: Once | INTRAVENOUS | Status: AC | PRN
  Administered 2023-01-02: 6 mL via INTRAVENOUS

## 2023-01-03 ENCOUNTER — Telehealth: Payer: Self-pay

## 2023-01-03 DIAGNOSIS — C8170 Other classical Hodgkin lymphoma, unspecified site: Secondary | ICD-10-CM

## 2023-01-03 NOTE — Telephone Encounter (Signed)
-----   Message from Rickard Patience, MD sent at 01/03/2023  9:23 AM EDT ----- Please let patient know that MRI findings are consistent with known spondylosis/disc protrusion. Recommend him to follow up with neurosurgeon.  The known mass in the chest appears slightly bigger in size. Recommend CT chest w contrast asap thanks. Please arrange.

## 2023-01-03 NOTE — Telephone Encounter (Signed)
Spoke to pt and informed him of MD results and recommendations.   Please schedule CT chest ASAP and inform pt of appt details. He can be reached at [phone].

## 2023-01-07 ENCOUNTER — Ambulatory Visit: Admission: RE | Admit: 2023-01-07 | Payer: Medicaid Other | Source: Ambulatory Visit

## 2023-01-15 ENCOUNTER — Ambulatory Visit
Admission: RE | Admit: 2023-01-15 | Discharge: 2023-01-15 | Disposition: A | Discharge status: Home / Self Care | Payer: Medicaid Other | Source: Ambulatory Visit | Attending: Oncology | Admitting: Oncology

## 2023-01-15 DIAGNOSIS — C8170 Other classical Hodgkin lymphoma, unspecified site: Secondary | ICD-10-CM | POA: Diagnosis present

## 2023-01-15 MED ORDER — IOHEXOL 300 MG/ML  SOLN
75.0000 mL | Freq: Once | INTRAMUSCULAR | Status: AC | PRN
  Administered 2023-01-15: 75 mL via INTRAVENOUS

## 2023-02-20 ENCOUNTER — Telehealth: Payer: Self-pay | Admitting: *Deleted

## 2023-02-20 ENCOUNTER — Inpatient Hospital Stay: Payer: Medicaid Other | Attending: Oncology

## 2023-02-20 NOTE — Telephone Encounter (Addendum)
Patient called reporting that he is having pain and numbness in his left chest and arm and is asking for ain medicine to be sent in for it. Please advise.

## 2023-02-21 ENCOUNTER — Inpatient Hospital Stay (HOSPITAL_BASED_OUTPATIENT_CLINIC_OR_DEPARTMENT_OTHER): Payer: Medicaid Other | Admitting: Medical Oncology

## 2023-02-21 ENCOUNTER — Inpatient Hospital Stay: Payer: Medicaid Other | Attending: Oncology

## 2023-02-21 ENCOUNTER — Other Ambulatory Visit: Payer: Self-pay

## 2023-02-21 ENCOUNTER — Encounter: Payer: Medicaid Other | Admitting: Medical Oncology

## 2023-02-21 VITALS — BP 154/96 | HR 63 | Temp 98.4°F | Resp 16

## 2023-02-21 DIAGNOSIS — D500 Iron deficiency anemia secondary to blood loss (chronic): Secondary | ICD-10-CM

## 2023-02-21 DIAGNOSIS — C8170 Other classical Hodgkin lymphoma, unspecified site: Secondary | ICD-10-CM | POA: Diagnosis not present

## 2023-02-21 DIAGNOSIS — C8192 Hodgkin lymphoma, unspecified, intrathoracic lymph nodes: Secondary | ICD-10-CM | POA: Insufficient documentation

## 2023-02-21 DIAGNOSIS — Z87891 Personal history of nicotine dependence: Secondary | ICD-10-CM | POA: Insufficient documentation

## 2023-02-21 DIAGNOSIS — M4800 Spinal stenosis, site unspecified: Secondary | ICD-10-CM | POA: Diagnosis not present

## 2023-02-21 DIAGNOSIS — M79603 Pain in arm, unspecified: Secondary | ICD-10-CM | POA: Insufficient documentation

## 2023-02-21 DIAGNOSIS — R200 Anesthesia of skin: Secondary | ICD-10-CM | POA: Diagnosis not present

## 2023-02-21 DIAGNOSIS — M8929 Other disorders of bone development and growth, multiple sites: Secondary | ICD-10-CM | POA: Diagnosis present

## 2023-02-21 DIAGNOSIS — M4802 Spinal stenosis, cervical region: Secondary | ICD-10-CM

## 2023-02-21 DIAGNOSIS — G8929 Other chronic pain: Secondary | ICD-10-CM | POA: Insufficient documentation

## 2023-02-21 LAB — CBC WITH DIFFERENTIAL/PLATELET
Abs Immature Granulocytes: 0.04 10*3/uL (ref 0.00–0.07)
Basophils Absolute: 0 10*3/uL (ref 0.0–0.1)
Basophils Relative: 0 %
Eosinophils Absolute: 0 10*3/uL (ref 0.0–0.5)
Eosinophils Relative: 0 %
HCT: 29.4 % — ABNORMAL LOW (ref 39.0–52.0)
Hemoglobin: 10.2 g/dL — ABNORMAL LOW (ref 13.0–17.0)
Immature Granulocytes: 1 %
Lymphocytes Relative: 17 %
Lymphs Abs: 0.8 10*3/uL (ref 0.7–4.0)
MCH: 24.8 pg — ABNORMAL LOW (ref 26.0–34.0)
MCHC: 34.7 g/dL (ref 30.0–36.0)
MCV: 71.4 fL — ABNORMAL LOW (ref 80.0–100.0)
Monocytes Absolute: 0.6 10*3/uL (ref 0.1–1.0)
Monocytes Relative: 12 %
Neutro Abs: 3.3 10*3/uL (ref 1.7–7.7)
Neutrophils Relative %: 70 %
Platelets: 280 10*3/uL (ref 150–400)
RBC: 4.12 MIL/uL — ABNORMAL LOW (ref 4.22–5.81)
RDW: 19.5 % — ABNORMAL HIGH (ref 11.5–15.5)
WBC: 4.8 10*3/uL (ref 4.0–10.5)
nRBC: 0 % (ref 0.0–0.2)

## 2023-02-21 LAB — COMPREHENSIVE METABOLIC PANEL
ALT: 18 U/L (ref 0–44)
AST: 19 U/L (ref 15–41)
Albumin: 4.3 g/dL (ref 3.5–5.0)
Alkaline Phosphatase: 39 U/L (ref 38–126)
Anion gap: 10 (ref 5–15)
BUN: 8 mg/dL (ref 6–20)
CO2: 22 mmol/L (ref 22–32)
Calcium: 8.9 mg/dL (ref 8.9–10.3)
Chloride: 101 mmol/L (ref 98–111)
Creatinine, Ser: 0.53 mg/dL — ABNORMAL LOW (ref 0.61–1.24)
GFR, Estimated: >60 mL/min (ref >60)
Glucose, Bld: 88 mg/dL (ref 70–99)
Potassium: 3.2 mmol/L — ABNORMAL LOW (ref 3.5–5.1)
Sodium: 133 mmol/L — ABNORMAL LOW (ref 135–145)
Total Bilirubin: 1.4 mg/dL — ABNORMAL HIGH (ref 0.3–1.2)
Total Protein: 7.5 g/dL (ref 6.5–8.1)

## 2023-02-21 LAB — IRON AND TIBC
Iron: 53 ug/dL (ref 45–182)
Saturation Ratios: 11 % — ABNORMAL LOW (ref 17.9–39.5)
TIBC: 469 ug/dL — ABNORMAL HIGH (ref 250–450)
UIBC: 416 ug/dL

## 2023-02-21 LAB — VITAMIN B12: Vitamin B-12: 372 pg/mL (ref 180–914)

## 2023-02-21 LAB — RETIC PANEL
Immature Retic Fract: 15 % (ref 2.3–15.9)
RBC.: 4.08 MIL/uL — ABNORMAL LOW (ref 4.22–5.81)
Retic Count, Absolute: 37.9 10*3/uL (ref 19.0–186.0)
Retic Ct Pct: 0.9 % (ref 0.4–3.1)
Reticulocyte Hemoglobin: 29.7 pg (ref >27.9)

## 2023-02-21 LAB — FERRITIN: Ferritin: 11 ng/mL — ABNORMAL LOW (ref 24–336)

## 2023-02-21 MED ORDER — GABAPENTIN 300 MG PO CAPS: 300.0000 mg | ORAL_CAPSULE | Freq: Two times a day (BID) | ORAL | 0 refills | Status: DC

## 2023-02-21 MED ORDER — PREDNISONE 10 MG PO TABS: ORAL_TABLET | ORAL | 0 refills | Status: DC

## 2023-02-21 MED ORDER — SODIUM CHLORIDE 0.9% FLUSH
10.0000 mL | Freq: Once | INTRAVENOUS | Status: AC
  Administered 2023-02-21: 10 mL via INTRAVENOUS
  Filled 2023-02-21: qty 10

## 2023-02-21 MED ORDER — SODIUM CHLORIDE 0.9 % IV SOLN
Freq: Once | INTRAVENOUS | Status: DC
  Filled 2023-02-21: qty 250

## 2023-02-21 MED ORDER — HEPARIN SOD (PORK) LOCK FLUSH 100 UNIT/ML IV SOLN
500.0000 [IU] | Freq: Once | INTRAVENOUS | Status: AC
  Administered 2023-02-21: 500 [IU] via INTRAVENOUS
  Filled 2023-02-21: qty 5

## 2023-02-21 NOTE — Telephone Encounter (Signed)
Spoke with patient and advised [facility] visit today. He has appt for pot flush at 2 pm, so we will do port flush/portlabs and see Maralyn Sago after to be evaluated. Patient verbalized understanding.

## 2023-02-21 NOTE — Progress Notes (Signed)
Hematology and Oncology Follow Up Visit  Preston [name] [id number] 04-05-1986 37 y.o. 02/21/2023  Past Medical History:  Diagnosis Date   Alcohol abuse    Chronic low back pain    Hodgkin's lymphoma (HCC)    Stenosis of cervical spine    Left   Tobacco use     Principle Diagnosis:  Hodgkin's lymphoma, classic type   Current Therapy:   Observation  PriorTherapy:    S/p 2 cycles of ABVD and 4 cycles of AVD.   Interim History:  Preston [name] is here for an unplanned visit for pain:  Arm Pain: Patient has chronic pain and numbness that is secondary to his severe spinal stenosis. Initially pain also attributed to his Hodgkin's Lymphoma. Today he reports that pain has been worse than normal for the past week or so. No known injury. He denies any other neurological changes. No pain with cardiac activity. He has tried tylenol and ibuprofen without much relief. He has an appointment with a neurosurgeons office in a few months.   No unintentional weight loss, night sweats, facial droop, slurred speech.   Wt Readings from Last 3 Encounters:  12/28/22 140 lb (63.5 kg)  12/05/22 140 lb 12.8 oz (63.9 kg)  07/15/22 140 lb (63.5 kg)     Medications:   Current Outpatient Medications:    acetaminophen (TYLENOL) 500 MG tablet, Take 1,000 mg by mouth every 6 (six) hours as needed for mild pain or moderate pain., Disp: , Rfl:    gabapentin (NEURONTIN) 300 MG capsule, Take 1 capsule (300 mg total) by mouth 2 (two) times daily for 10 days., Disp: 20 capsule, Rfl: 0   ibuprofen (ADVIL) 200 MG tablet, Take 600-800 mg by mouth every 6 (six) hours as needed for mild pain or moderate pain., Disp: , Rfl:    iron polysaccharides (NIFEREX) 150 MG capsule, Take 1 capsule (150 mg total) by mouth 2 (two) times daily., Disp: 120 capsule, Rfl: 1   predniSONE (DELTASONE) 10 MG tablet, Take 4 tablets by mouth with breakfast for 2 days, 2 tablets by mouth for 2 days and 1 tablet by mouth for 2 days., Disp: 14  tablet, Rfl: 0   docusate sodium (COLACE) 100 MG capsule, Take 1 capsule (100 mg total) by mouth 2 (two) times daily. (Patient not taking: Reported on 02/21/2023), Disp: 10 capsule, Rfl: 0   nicotine (NICODERM CQ - DOSED IN MG/24 HOURS) 21 mg/24hr patch, Place 1 patch (21 mg total) onto the skin daily as needed (Nicotine craving). (Patient not taking: Reported on 02/21/2023), Disp: 28 patch, Rfl: 0   oxyCODONE-acetaminophen (PERCOCET) 5-325 MG tablet, Take 1 tablet by mouth every 8 (eight) hours as needed for moderate pain or severe pain. (Patient not taking: Reported on 02/21/2023), Disp: 10 tablet, Rfl: 0   polyethylene glycol (MIRALAX / GLYCOLAX) 17 g packet, Take 17 g by mouth daily. (Patient not taking: Reported on 02/21/2023), Disp: 14 each, Rfl: 0 No current facility-administered medications for this visit.  Facility-Administered Medications Ordered in Other Visits:    heparin lock flush 100 unit/mL, 500 Units, Intravenous, Once, Rickard Patience, MD   sodium chloride flush (NS) 0.9 % injection 10 mL, 10 mL, Intravenous, PRN, Rickard Patience, MD, 10 mL at 08/07/20 4098  Allergies: No Known Allergies  Past Medical History, Surgical history, Social history, and Family History were reviewed and updated.  Review of Systems: As stated above in HPI   Physical Exam:  oral temperature is 98.4 F (36.9 C). His blood  pressure is 154/96 (abnormal) and his pulse is 63. His respiration is 16 and oxygen saturation is 100%.   Physical Exam General: NAD. Speech is intact Lymph: No palpable lymphadenopathy of head, neck or upper torso Cardiovascular: regular rate and rhythm, no peripheral edema Pulmonary: clear ant fields Extremities: no edema, no joint deformities Skin: no rashes Neurological: Cranial nerves appear intact. Grip strength 5/5 bilaterally. Gait normal.  MSK: Scant tenderness to palpation of left pectoral muscle . No midline tenderness of spine.    Lab Results  Component Value Date   WBC 4.8  02/21/2023   HGB 10.2 (L) 02/21/2023   HCT 29.4 (L) 02/21/2023   MCV 71.4 (L) 02/21/2023   PLT 280 02/21/2023     Chemistry      Component Value Date/Time   NA 133 (L) 02/21/2023 1409   K 3.2 (L) 02/21/2023 1409   CL 101 02/21/2023 1409   CO2 22 02/21/2023 1409   BUN 8 02/21/2023 1409   CREATININE 0.53 (L) 02/21/2023 1409      Component Value Date/Time   CALCIUM 8.9 02/21/2023 1409   ALKPHOS 39 02/21/2023 1409   AST 19 02/21/2023 1409   ALT 18 02/21/2023 1409   BILITOT 1.4 (H) 02/21/2023 1409      Assessment and Plan- Patient is a 37 y.o. male    Encounter Diagnoses  Name Primary?   Other classical Hodgkin lymphoma, unspecified body region [facility]) Yes   Left sided numbness    Cervical stenosis of spinal canal    Acute on chronic pain. I have reviewed his recent images and discussed pain with patient. At this time his pain appears secondary to an acute flare of his spinal stenosis. He would benefit from steroids and gabapentin. Discussed how to take along with risks/benefits. I have also discussed red flag signs and symptoms. I will plan to have him return in 2 weeks for follow up to ensure his symptoms have not worsened. If needed, we will move up his planned repeat imaging regarding his Hodgkin's lymphoma to ensure this is not related to his current pain. At the moment his blood work, exam and vitals do not support a recurrence.    Disposition: 2 weeks APP, labs (CBC w/, CMP)   Clent Jacks PA-C 7/12/20243:40 PM

## 2023-02-21 NOTE — Progress Notes (Signed)
Pain has started 2 weeks ago. Pain is in his left arm into shoulder and L side of thoracic area. Arm has numbness down entire back of arm as well.Tension and pulling from neck area to left breast area.Tylenol and ibuprofen quit helping his pain. Doesn't have any narcotics to take.Not able to sleep due to pain. Pain currently 8/10 on pain scale.

## 2023-02-24 ENCOUNTER — Inpatient Hospital Stay: Payer: Medicaid Other

## 2023-03-06 ENCOUNTER — Other Ambulatory Visit: Payer: Self-pay

## 2023-03-06 DIAGNOSIS — C8170 Other classical Hodgkin lymphoma, unspecified site: Secondary | ICD-10-CM

## 2023-03-06 DIAGNOSIS — D500 Iron deficiency anemia secondary to blood loss (chronic): Secondary | ICD-10-CM

## 2023-03-07 ENCOUNTER — Inpatient Hospital Stay: Payer: Medicaid Other

## 2023-03-07 ENCOUNTER — Encounter: Payer: Self-pay | Admitting: Medical Oncology

## 2023-03-07 ENCOUNTER — Inpatient Hospital Stay (HOSPITAL_BASED_OUTPATIENT_CLINIC_OR_DEPARTMENT_OTHER): Payer: Medicaid Other | Admitting: Medical Oncology

## 2023-03-07 VITALS — BP 156/99 | HR 75 | Temp 97.2°F | Wt 143.0 lb

## 2023-03-07 DIAGNOSIS — R7401 Elevation of levels of liver transaminase levels: Secondary | ICD-10-CM | POA: Diagnosis not present

## 2023-03-07 DIAGNOSIS — K921 Melena: Secondary | ICD-10-CM | POA: Diagnosis not present

## 2023-03-07 DIAGNOSIS — D500 Iron deficiency anemia secondary to blood loss (chronic): Secondary | ICD-10-CM

## 2023-03-07 DIAGNOSIS — C8170 Other classical Hodgkin lymphoma, unspecified site: Secondary | ICD-10-CM

## 2023-03-07 DIAGNOSIS — Z95828 Presence of other vascular implants and grafts: Secondary | ICD-10-CM

## 2023-03-07 DIAGNOSIS — F1029 Alcohol dependence with unspecified alcohol-induced disorder: Secondary | ICD-10-CM

## 2023-03-07 DIAGNOSIS — M79603 Pain in arm, unspecified: Secondary | ICD-10-CM | POA: Diagnosis not present

## 2023-03-07 DIAGNOSIS — R634 Abnormal weight loss: Secondary | ICD-10-CM

## 2023-03-07 LAB — CMP (CANCER CENTER ONLY)
ALT: 17 U/L (ref 0–44)
AST: 26 U/L (ref 15–41)
Albumin: 4.2 g/dL (ref 3.5–5.0)
Alkaline Phosphatase: 38 U/L (ref 38–126)
Anion gap: 12 (ref 5–15)
BUN: 9 mg/dL (ref 6–20)
CO2: 23 mmol/L (ref 22–32)
Calcium: 9.3 mg/dL (ref 8.9–10.3)
Chloride: 101 mmol/L (ref 98–111)
Creatinine: 0.54 mg/dL — ABNORMAL LOW (ref 0.61–1.24)
GFR, Estimated: >60 mL/min (ref >60)
Glucose, Bld: 86 mg/dL (ref 70–99)
Potassium: 4 mmol/L (ref 3.5–5.1)
Sodium: 136 mmol/L (ref 135–145)
Total Bilirubin: 1 mg/dL (ref 0.3–1.2)
Total Protein: 7.7 g/dL (ref 6.5–8.1)

## 2023-03-07 LAB — CBC WITH DIFFERENTIAL (CANCER CENTER ONLY)
Abs Immature Granulocytes: 0.02 10*3/uL (ref 0.00–0.07)
Basophils Absolute: 0 10*3/uL (ref 0.0–0.1)
Basophils Relative: 0 %
Eosinophils Absolute: 0 10*3/uL (ref 0.0–0.5)
Eosinophils Relative: 0 %
HCT: 29.2 % — ABNORMAL LOW (ref 39.0–52.0)
Hemoglobin: 10.1 g/dL — ABNORMAL LOW (ref 13.0–17.0)
Immature Granulocytes: 0 %
Lymphocytes Relative: 13 %
Lymphs Abs: 0.8 10*3/uL (ref 0.7–4.0)
MCH: 24.8 pg — ABNORMAL LOW (ref 26.0–34.0)
MCHC: 34.6 g/dL (ref 30.0–36.0)
MCV: 71.7 fL — ABNORMAL LOW (ref 80.0–100.0)
Monocytes Absolute: 0.7 10*3/uL (ref 0.1–1.0)
Monocytes Relative: 11 %
Neutro Abs: 4.4 10*3/uL (ref 1.7–7.7)
Neutrophils Relative %: 76 %
Platelet Count: 240 10*3/uL (ref 150–400)
RBC: 4.07 MIL/uL — ABNORMAL LOW (ref 4.22–5.81)
RDW: 18.4 % — ABNORMAL HIGH (ref 11.5–15.5)
WBC Count: 5.8 10*3/uL (ref 4.0–10.5)
nRBC: 0 % (ref 0.0–0.2)

## 2023-03-07 MED ORDER — HEPARIN SOD (PORK) LOCK FLUSH 100 UNIT/ML IV SOLN
500.0000 [IU] | Freq: Once | INTRAVENOUS | Status: AC
  Administered 2023-03-07: 500 [IU] via INTRAVENOUS
  Filled 2023-03-07: qty 5

## 2023-03-07 MED ORDER — SODIUM CHLORIDE 0.9% FLUSH
10.0000 mL | Freq: Once | INTRAVENOUS | Status: AC
  Administered 2023-03-07: 10 mL via INTRAVENOUS
  Filled 2023-03-07: qty 10

## 2023-03-07 MED ORDER — GABAPENTIN 300 MG PO CAPS: ORAL_CAPSULE | ORAL | 2 refills | Status: DC

## 2023-03-07 MED ORDER — TRAMADOL HCL 50 MG PO TABS: 50.0000 mg | ORAL_TABLET | Freq: Two times a day (BID) | ORAL | 0 refills | Status: AC | PRN

## 2023-03-07 NOTE — Progress Notes (Signed)
Hematology and Oncology Follow Up Visit  Rue Routon [id number] 11/16/1985 37 y.o. 03/07/2023  Past Medical History:  Diagnosis Date   Alcohol abuse    Chronic low back pain    Hodgkin's lymphoma (HCC)    Stenosis of cervical spine    Left   Tobacco use     Principle Diagnosis:  Hodgkin's lymphoma, classic type  IDA  Current Therapy:   Observation Venofer  PriorTherapy:    S/p 2 cycles of ABVD and 4 cycles of AVD.   Interim History:  Mr. Helmich is here for an unplanned visit for pain:  Arm Pain: Patient has chronic pain and numbness that is secondary to his severe spinal stenosis. Initially pain also attributed to his Hodgkin's Lymphoma. At his last visit 2 weeks ago he reported pain that had been worse than normal without injury or neurological changes. He was trailed on steroids and gabapentin. Today he states that the prednisone helped with the pain and the gabapentin helped with both the numbness as well as some of the pain. Pain reduced from 10/10 to 6 or 7/10. The gabapentin also helped him eat more and drink less ETOH. Off of this medication he drinks three 40 ounce beers per day. He ran out of his gabapentin 1 day ago. He has noticed a difference and asks to be back on this medication. He also asks for a pain medication to help with the pain he has.   In addition I wanted to discuss his IDA with him. Per patient he has been on oral iron supplementation for the past month. He takes this 1-2 times per day and is tolerating it well. He is still craving ice. In terms of the work up for his IDA, he has never seem GI. He denies any epistaxis, hemoptysis, hematuria, melena. No kidney disease, no history of UC or crohns disease. He does not donate blood frequently. No known history of sickle cell disease or thalassemia though it does not appear patient has had work up. He does drink ETOH heavily and has a history of transaminitis He does not follow any special diets but due to  his ETOH intake he does not eat well or often. NO history of bariatric surgery.   Wt Readings from Last 3 Encounters:  03/07/23 143 lb (64.9 kg)  12/28/22 140 lb (63.5 kg)  12/05/22 140 lb 12.8 oz (63.9 kg)     Medications:   Current Outpatient Medications:    acetaminophen (TYLENOL) 500 MG tablet, Take 1,000 mg by mouth every 6 (six) hours as needed for mild pain or moderate pain., Disp: , Rfl:    docusate sodium (COLACE) 100 MG capsule, Take 1 capsule (100 mg total) by mouth 2 (two) times daily. (Patient not taking: Reported on 02/21/2023), Disp: 10 capsule, Rfl: 0   gabapentin (NEURONTIN) 300 MG capsule, Take 1 capsule (300 mg total) by mouth 2 (two) times daily for 10 days., Disp: 20 capsule, Rfl: 0   ibuprofen (ADVIL) 200 MG tablet, Take 600-800 mg by mouth every 6 (six) hours as needed for mild pain or moderate pain., Disp: , Rfl:    iron polysaccharides (NIFEREX) 150 MG capsule, Take 1 capsule (150 mg total) by mouth 2 (two) times daily., Disp: 120 capsule, Rfl: 1   nicotine (NICODERM CQ - DOSED IN MG/24 HOURS) 21 mg/24hr patch, Place 1 patch (21 mg total) onto the skin daily as needed (Nicotine craving). (Patient not taking: Reported on 02/21/2023), Disp: 28 patch, Rfl: 0  oxyCODONE-acetaminophen (PERCOCET) 5-325 MG tablet, Take 1 tablet by mouth every 8 (eight) hours as needed for moderate pain or severe pain. (Patient not taking: Reported on 02/21/2023), Disp: 10 tablet, Rfl: 0   polyethylene glycol (MIRALAX / GLYCOLAX) 17 g packet, Take 17 g by mouth daily. (Patient not taking: Reported on 02/21/2023), Disp: 14 each, Rfl: 0   predniSONE (DELTASONE) 10 MG tablet, Take 4 tablets by mouth with breakfast for 2 days, 2 tablets by mouth for 2 days and 1 tablet by mouth for 2 days. (Patient not taking: Reported on 03/07/2023), Disp: 14 tablet, Rfl: 0 No current facility-administered medications for this visit.  Facility-Administered Medications Ordered in Other Visits:    heparin lock flush  100 unit/mL, 500 Units, Intravenous, Once, Rickard Patience, MD   sodium chloride flush (NS) 0.9 % injection 10 mL, 10 mL, Intravenous, PRN, Rickard Patience, MD, 10 mL at 08/07/20 2440  Allergies: No Known Allergies  Past Medical History, Surgical history, Social history, and Family History were reviewed and updated.  Review of Systems: As stated above in HPI   Physical Exam:  weight is 143 lb (64.9 kg). His tympanic temperature is 97.2 F (36.2 C) (abnormal). His blood pressure is 156/99 (abnormal) and his pulse is 75. His oxygen saturation is 100%.   Physical Exam General: NAD. Speech is intact Lymph: No palpable lymphadenopathy of head, neck or upper torso Cardiovascular: regular rate and rhythm, no peripheral edema Pulmonary: clear ant fields Extremities: no edema, no joint deformities Skin: no rashes Neurological: Cranial nerves appear intact. Grip strength 5/5 bilaterally. Gait normal.  MSK: Scant tenderness to palpation of left pectoral muscle . No midline tenderness of spine.    Lab Results  Component Value Date   WBC 4.8 02/21/2023   HGB 10.2 (L) 02/21/2023   HCT 29.4 (L) 02/21/2023   MCV 71.4 (L) 02/21/2023   PLT 280 02/21/2023     Chemistry      Component Value Date/Time   NA 133 (L) 02/21/2023 1409   K 3.2 (L) 02/21/2023 1409   CL 101 02/21/2023 1409   CO2 22 02/21/2023 1409   BUN 8 02/21/2023 1409   CREATININE 0.53 (L) 02/21/2023 1409      Component Value Date/Time   CALCIUM 8.9 02/21/2023 1409   ALKPHOS 39 02/21/2023 1409   AST 19 02/21/2023 1409   ALT 18 02/21/2023 1409   BILITOT 1.4 (H) 02/21/2023 1409      Assessment and Plan- Patient is a 37 y.o. male    Encounter Diagnoses  Name Primary?   Other classical Hodgkin lymphoma, unspecified body region (HCC) Yes   Iron deficiency anemia due to chronic blood loss     Glad to hear that the gabapentin was so helpful for him in many ways. We discussed that gabapentin can also be used for ETOH reduction efforts.  At this time I am going to have him restart the gabapentin with goal of titrating from BID to TID in 1 week. I have also agreed to prescribing him tramadol until his appointment with neurosurgery on Oct 24th. He is aware that after their visit in Oct I will be transferring his rx to them and will no longer be prescribing his gabapentin or pain medication as the pain is related to his stenosis and less from complications of his lymphoma. PMPD reviewed.  We discussed black box warnings in regards to his tramadol which he has tolerated well in the past. He has agreed to actively work on  slow ETOH reduction. In addition he is agreeable to a GI referral for IDA work up. For now he will start a pre-natal vitamin and we will start IV iron sessions. Reviewed how these are performed along with risks/benefits. I have recommended Venofer 300 mg weekly x 3. At his next follow up we will check for other vitamin deficiencies that may be impacting his symptoms and CBC levels.   Disposition: Venofer 300 mg weekly x 3 weeks total starting next week RTC 1 month MD/APP (CBC w/, CMP, iron, ferritin , folate, B12)-  Clent Jacks PA-C 7/26/20242:30 PM

## 2023-03-13 ENCOUNTER — Inpatient Hospital Stay: Payer: Medicaid Other | Attending: Oncology

## 2023-03-13 VITALS — BP 134/82 | HR 79 | Temp 98.0°F

## 2023-03-13 DIAGNOSIS — D500 Iron deficiency anemia secondary to blood loss (chronic): Secondary | ICD-10-CM

## 2023-03-13 MED ORDER — HEPARIN SOD (PORK) LOCK FLUSH 100 UNIT/ML IV SOLN
500.0000 [IU] | Freq: Once | INTRAVENOUS | Status: AC | PRN
  Administered 2023-03-13: 500 [IU]
  Filled 2023-03-13: qty 5

## 2023-03-13 MED ORDER — SODIUM CHLORIDE 0.9 % IV SOLN
300.0000 mg | Freq: Once | INTRAVENOUS | Status: AC
  Administered 2023-03-13: 300 mg via INTRAVENOUS
  Filled 2023-03-13: qty 300

## 2023-03-13 MED ORDER — SODIUM CHLORIDE 0.9 % IV SOLN
Freq: Once | INTRAVENOUS | Status: AC
  Filled 2023-03-13: qty 250

## 2023-03-13 MED ORDER — SODIUM CHLORIDE 0.9% FLUSH
10.0000 mL | Freq: Once | INTRAVENOUS | Status: AC | PRN
  Administered 2023-03-13: 10 mL
  Filled 2023-03-13: qty 10

## 2023-03-20 ENCOUNTER — Inpatient Hospital Stay: Payer: Medicaid Other

## 2023-03-20 VITALS — BP 136/90 | HR 89 | Temp 99.5°F | Resp 18

## 2023-03-20 DIAGNOSIS — D500 Iron deficiency anemia secondary to blood loss (chronic): Secondary | ICD-10-CM | POA: Diagnosis not present

## 2023-03-20 MED ORDER — HEPARIN SOD (PORK) LOCK FLUSH 100 UNIT/ML IV SOLN
500.0000 [IU] | Freq: Once | INTRAVENOUS | Status: AC | PRN
  Administered 2023-03-20: 500 [IU]
  Filled 2023-03-20: qty 5

## 2023-03-20 MED ORDER — SODIUM CHLORIDE 0.9 % IV SOLN
Freq: Once | INTRAVENOUS | Status: AC
  Filled 2023-03-20: qty 250

## 2023-03-20 MED ORDER — SODIUM CHLORIDE 0.9 % IV SOLN
300.0000 mg | Freq: Once | INTRAVENOUS | Status: AC
  Administered 2023-03-20: 300 mg via INTRAVENOUS
  Filled 2023-03-20: qty 300

## 2023-03-27 ENCOUNTER — Other Ambulatory Visit: Payer: Self-pay | Admitting: Oncology

## 2023-03-27 ENCOUNTER — Telehealth: Payer: Self-pay | Admitting: *Deleted

## 2023-03-27 ENCOUNTER — Inpatient Hospital Stay: Payer: Medicaid Other

## 2023-03-27 NOTE — Telephone Encounter (Signed)
Patient called reporting that he ios scheduled for iron infusion and that he had a reaction after his last infusion after leaving here and did not let us know claiming that he could not get through to tell us. He states that he had severe itching and little bumps all over. He is asking if he still needs to get iron . He is scheduled for same iron (venofer)on 8/23 and does not see doctor until 8/28. Please advise

## 2023-03-27 NOTE — Telephone Encounter (Signed)
I called patient and asked if he is comfortable with getting premeds prior to his next iron infusion or if he wants to wait until seen by doctor to discuss other options. He asked what I meant by pre medications and I explained to him that it would probably consist of benadryl and maybe steroids before he gets his iron and he said oh yeah, I can do that. Please add to his treatment plan.  Of note, he states that he did not stay but 10-15 minutes after his iron was given so I asked that he hang around for 30-40 minutes after his next infusion to be sure he does not have another reaction. He agreed to this

## 2023-04-04 ENCOUNTER — Inpatient Hospital Stay: Payer: Medicaid Other

## 2023-04-04 ENCOUNTER — Ambulatory Visit: Admission: RE | Admit: 2023-04-04 | Payer: Medicaid Other | Source: Ambulatory Visit

## 2023-04-04 ENCOUNTER — Telehealth: Payer: Self-pay | Admitting: Oncology

## 2023-04-04 NOTE — Telephone Encounter (Signed)
Pt had called to cancel CT today due to car troubles. CT has been r/s for Monday and following appts have been adjusted.  Mailing AVS with updated appts, pt aware of CT on monday

## 2023-04-07 ENCOUNTER — Ambulatory Visit: Payer: Medicaid Other

## 2023-04-07 ENCOUNTER — Other Ambulatory Visit: Payer: Medicaid Other

## 2023-04-07 ENCOUNTER — Ambulatory Visit: Payer: Medicaid Other | Admitting: Oncology

## 2023-04-07 ENCOUNTER — Inpatient Hospital Stay: Payer: Medicaid Other

## 2023-04-07 ENCOUNTER — Ambulatory Visit: Payer: Medicaid Other | Admitting: Nurse Practitioner

## 2023-04-07 ENCOUNTER — Ambulatory Visit: Admission: RE | Admit: 2023-04-07 | Payer: Medicaid Other | Source: Ambulatory Visit

## 2023-04-09 ENCOUNTER — Ambulatory Visit: Payer: Medicaid Other | Admitting: Oncology

## 2023-04-09 ENCOUNTER — Inpatient Hospital Stay: Payer: Medicaid Other

## 2023-04-11 ENCOUNTER — Ambulatory Visit
Admission: RE | Admit: 2023-04-11 | Discharge: 2023-04-11 | Disposition: A | Discharge status: Home / Self Care | Payer: Medicaid Other | Source: Ambulatory Visit | Attending: Oncology | Admitting: Oncology

## 2023-04-11 ENCOUNTER — Inpatient Hospital Stay: Payer: Medicaid Other

## 2023-04-11 DIAGNOSIS — C8170 Other classical Hodgkin lymphoma, unspecified site: Secondary | ICD-10-CM | POA: Diagnosis not present

## 2023-04-11 DIAGNOSIS — D500 Iron deficiency anemia secondary to blood loss (chronic): Secondary | ICD-10-CM

## 2023-04-11 LAB — CMP (CANCER CENTER ONLY)
ALT: 17 U/L (ref 0–44)
AST: 19 U/L (ref 15–41)
Albumin: 4.2 g/dL (ref 3.5–5.0)
Alkaline Phosphatase: 40 U/L (ref 38–126)
Anion gap: 8 (ref 5–15)
BUN: 6 mg/dL (ref 6–20)
CO2: 24 mmol/L (ref 22–32)
Calcium: 9.1 mg/dL (ref 8.9–10.3)
Chloride: 102 mmol/L (ref 98–111)
Creatinine: 0.75 mg/dL (ref 0.61–1.24)
GFR, Estimated: >60 mL/min (ref >60)
Glucose, Bld: 104 mg/dL — ABNORMAL HIGH (ref 70–99)
Potassium: 3.8 mmol/L (ref 3.5–5.1)
Sodium: 134 mmol/L — ABNORMAL LOW (ref 135–145)
Total Bilirubin: 0.9 mg/dL (ref 0.3–1.2)
Total Protein: 7.2 g/dL (ref 6.5–8.1)

## 2023-04-11 LAB — CBC WITH DIFFERENTIAL/PLATELET
Abs Immature Granulocytes: 0 10*3/uL (ref 0.00–0.07)
Basophils Absolute: 0 10*3/uL (ref 0.0–0.1)
Basophils Relative: 1 %
Eosinophils Absolute: 0 10*3/uL (ref 0.0–0.5)
Eosinophils Relative: 1 %
HCT: 35.8 % — ABNORMAL LOW (ref 39.0–52.0)
Hemoglobin: 12.7 g/dL — ABNORMAL LOW (ref 13.0–17.0)
Immature Granulocytes: 0 %
Lymphocytes Relative: 28 %
Lymphs Abs: 1.1 10*3/uL (ref 0.7–4.0)
MCH: 28.4 pg (ref 26.0–34.0)
MCHC: 35.5 g/dL (ref 30.0–36.0)
MCV: 80.1 fL (ref 80.0–100.0)
Monocytes Absolute: 0.6 10*3/uL (ref 0.1–1.0)
Monocytes Relative: 15 %
Neutro Abs: 2.2 10*3/uL (ref 1.7–7.7)
Neutrophils Relative %: 55 %
Platelets: 248 10*3/uL (ref 150–400)
RBC: 4.47 MIL/uL (ref 4.22–5.81)
RDW: 21.1 % — ABNORMAL HIGH (ref 11.5–15.5)
WBC: 3.9 10*3/uL — ABNORMAL LOW (ref 4.0–10.5)
nRBC: 0 % (ref 0.0–0.2)

## 2023-04-11 LAB — IRON AND TIBC
Iron: 82 ug/dL (ref 45–182)
Saturation Ratios: 22 % (ref 17.9–39.5)
TIBC: 379 ug/dL (ref 250–450)
UIBC: 297 ug/dL

## 2023-04-11 LAB — FERRITIN: Ferritin: 61 ng/mL (ref 24–336)

## 2023-04-11 LAB — LACTATE DEHYDROGENASE: LDH: 92 U/L — ABNORMAL LOW (ref 98–192)

## 2023-04-11 MED ORDER — BARIUM SULFATE 2 % PO SUSP
450.0000 mL | ORAL | Status: AC
  Administered 2023-04-11 (×2): 1 mL via ORAL

## 2023-04-11 MED ORDER — SODIUM CHLORIDE 0.9% FLUSH
10.0000 mL | Freq: Once | INTRAVENOUS | Status: AC | PRN
  Administered 2023-04-11: 10 mL
  Filled 2023-04-11: qty 10

## 2023-04-11 MED ORDER — IOHEXOL 300 MG/ML  SOLN
100.0000 mL | Freq: Once | INTRAMUSCULAR | Status: AC | PRN
  Administered 2023-04-11: 100 mL via INTRAVENOUS

## 2023-04-11 MED ORDER — HEPARIN SOD (PORK) LOCK FLUSH 100 UNIT/ML IV SOLN
500.0000 [IU] | Freq: Once | INTRAVENOUS | Status: AC | PRN
  Administered 2023-04-11: 500 [IU]
  Filled 2023-04-11: qty 5

## 2023-04-15 ENCOUNTER — Telehealth: Payer: Self-pay | Admitting: *Deleted

## 2023-04-15 NOTE — Telephone Encounter (Signed)
Patient called requesting medicine for pain stating that the Gabapentin is helping with the numbness but not the pain in his left neck/chest area. Please advise

## 2023-04-15 NOTE — Telephone Encounter (Signed)
Per Dr. Cathie Hoops, she recommends for him to reach out to neurosurgery. He has stenosis and pain is most likely related to that. It was discussed at last visit

## 2023-04-15 NOTE — Telephone Encounter (Signed)
I attempted to call patient to inform of doctor response, but it went to voice mail and unable to leave a message due to voice mail being full

## 2023-04-15 NOTE — Telephone Encounter (Signed)
Called report  IMPRESSION: 1. Increased size of the dominant anterior mediastinal mass and mediastinal lymph nodes, compatible with progressive lymphomatous disease 2. No lymphadenopathy below the diaphragm and no splenomegaly. 3.  Aortic Atherosclerosis (ICD10-I70.0).   These results will be called to the ordering clinician or representative by the Radiologist Assistant, and communication documented in the PACS or [employer].     Electronically Signed   By: Maudry Mayhew M.D.   On: 04/12/2023 08:58

## 2023-04-16 ENCOUNTER — Inpatient Hospital Stay: Payer: Medicaid Other

## 2023-04-16 ENCOUNTER — Inpatient Hospital Stay: Payer: Medicaid Other | Admitting: Oncology

## 2023-04-16 NOTE — Telephone Encounter (Signed)
I again attempted to reach patient and got voice mail that aid that his mailbox is full

## 2023-04-17 ENCOUNTER — Inpatient Hospital Stay: Payer: Medicaid Other

## 2023-04-17 ENCOUNTER — Inpatient Hospital Stay: Payer: Medicaid Other | Attending: Oncology | Admitting: Oncology

## 2023-04-18 ENCOUNTER — Encounter: Payer: Self-pay | Admitting: Oncology

## 2023-04-18 ENCOUNTER — Inpatient Hospital Stay: Payer: Medicaid Other | Admitting: Oncology

## 2023-04-18 ENCOUNTER — Other Ambulatory Visit: Payer: Medicaid Other

## 2023-04-18 ENCOUNTER — Ambulatory Visit: Payer: Medicaid Other

## 2023-04-18 ENCOUNTER — Inpatient Hospital Stay: Payer: Medicaid Other

## 2023-04-21 ENCOUNTER — Inpatient Hospital Stay: Payer: Medicaid Other

## 2023-04-23 ENCOUNTER — Ambulatory Visit: Payer: Medicaid Other | Admitting: Oncology

## 2023-04-23 ENCOUNTER — Inpatient Hospital Stay: Payer: Medicaid Other

## 2023-05-02 ENCOUNTER — Inpatient Hospital Stay: Payer: Medicaid Other

## 2023-05-02 ENCOUNTER — Inpatient Hospital Stay: Payer: Medicaid Other | Attending: Oncology | Admitting: Oncology

## 2023-05-02 ENCOUNTER — Encounter: Payer: Self-pay | Admitting: Oncology

## 2023-05-02 VITALS — BP 134/95 | HR 86 | Temp 98.0°F | Resp 18 | Wt 144.0 lb

## 2023-05-02 DIAGNOSIS — D500 Iron deficiency anemia secondary to blood loss (chronic): Secondary | ICD-10-CM

## 2023-05-02 DIAGNOSIS — C8170 Other classical Hodgkin lymphoma, unspecified site: Secondary | ICD-10-CM | POA: Diagnosis not present

## 2023-05-02 DIAGNOSIS — Z95828 Presence of other vascular implants and grafts: Secondary | ICD-10-CM

## 2023-05-02 DIAGNOSIS — I700 Atherosclerosis of aorta: Secondary | ICD-10-CM | POA: Diagnosis not present

## 2023-05-02 DIAGNOSIS — R61 Generalized hyperhidrosis: Secondary | ICD-10-CM | POA: Diagnosis not present

## 2023-05-02 DIAGNOSIS — F17210 Nicotine dependence, cigarettes, uncomplicated: Secondary | ICD-10-CM | POA: Insufficient documentation

## 2023-05-02 DIAGNOSIS — C8192 Hodgkin lymphoma, unspecified, intrathoracic lymph nodes: Secondary | ICD-10-CM | POA: Insufficient documentation

## 2023-05-02 DIAGNOSIS — K921 Melena: Secondary | ICD-10-CM

## 2023-05-02 DIAGNOSIS — F1090 Alcohol use, unspecified, uncomplicated: Secondary | ICD-10-CM | POA: Diagnosis not present

## 2023-05-02 DIAGNOSIS — D509 Iron deficiency anemia, unspecified: Secondary | ICD-10-CM | POA: Insufficient documentation

## 2023-05-02 DIAGNOSIS — M4802 Spinal stenosis, cervical region: Secondary | ICD-10-CM

## 2023-05-02 LAB — RETIC PANEL
Immature Retic Fract: 15.4 % (ref 2.3–15.9)
RBC.: 4.45 MIL/uL (ref 4.22–5.81)
Retic Count, Absolute: 44.9 10*3/uL (ref 19.0–186.0)
Retic Ct Pct: 1 % (ref 0.4–3.1)
Reticulocyte Hemoglobin: 34.2 pg (ref >27.9)

## 2023-05-02 LAB — CBC (CANCER CENTER ONLY)
HCT: 36.3 % — ABNORMAL LOW (ref 39.0–52.0)
Hemoglobin: 13 g/dL (ref 13.0–17.0)
MCH: 29 pg (ref 26.0–34.0)
MCHC: 35.8 g/dL (ref 30.0–36.0)
MCV: 81 fL (ref 80.0–100.0)
Platelet Count: 228 10*3/uL (ref 150–400)
RBC: 4.48 MIL/uL (ref 4.22–5.81)
RDW: 17.5 % — ABNORMAL HIGH (ref 11.5–15.5)
WBC Count: 5 10*3/uL (ref 4.0–10.5)
nRBC: 0 % (ref 0.0–0.2)

## 2023-05-02 LAB — HEPATITIS PANEL, ACUTE
HCV Ab: NONREACTIVE
Hep A IgM: NONREACTIVE
Hep B C IgM: NONREACTIVE
Hepatitis B Surface Ag: NONREACTIVE

## 2023-05-02 LAB — SEDIMENTATION RATE: Sed Rate: 2 mm/hr (ref 0–15)

## 2023-05-02 MED ORDER — TRAMADOL HCL 50 MG PO TABS
50.0000 mg | ORAL_TABLET | Freq: Three times a day (TID) | ORAL | 0 refills | Status: DC | PRN
Start: 2023-05-02 — End: 2024-02-02

## 2023-05-02 NOTE — Assessment & Plan Note (Signed)
left antecubital fossa port, he has not flushed as instructed.  I previous discussed with vascular surgeon Dr.Dew.  Plan is to remove this port as it is associated with high risk of occlusion/thrombosis. However patient did not follow up with vascular surgery.  Continue port flush.

## 2023-05-02 NOTE — Assessment & Plan Note (Signed)
#   Hodgkin's lymphoma, classic type S/p 2 cycles of ABVD and 4 cycles of AVD.  Sept 2024 CT showed increased size of anterior mediastinal mass and mediastinal lymph nodes Concerning for disease progression Recommend PET restaging and if confirmed increase activity, recommend biopsy

## 2023-05-02 NOTE — Assessment & Plan Note (Signed)
Check iron tibc ferritin.  Lab Results  Component Value Date   IRON 82 04/11/2023   TIBC 379 04/11/2023   IRONPCTSAT 22 04/11/2023   FERRITIN 61 04/11/2023    Improved hemoglobin and iron store

## 2023-05-02 NOTE — Progress Notes (Signed)
Hematology/Oncology Progress note [phone] [phone]     CHIEF COMPLAINTS/REASON FOR VISIT:  Follow up for hodgkin's lymphoma, left side numbness   ASSESSMENT & PLAN:   Malignant lymphoma, Hodgkin's type (HCC) # Hodgkin's lymphoma, classic type S/p 2 cycles of ABVD and 4 cycles of AVD.  Sept 2024 CT showed increased size of anterior mediastinal mass and mediastinal lymph nodes Concerning for disease progression Recommend PET restaging and if confirmed increase activity, recommend biopsy   Cervical stenosis of spinal canal New onset left side numbness, June 2024  MRI cervical, thoracic, lumbar spine results were reviewed with patient.  I ask patient to contact neurosurgeon for evaluation.  Tramadol 50mg  Q8h PRN for pain.   Port-A-Cath in place left antecubital fossa port, he has not flushed as instructed.  I previous discussed with vascular surgeon Dr.Dew.  Plan is to remove this port as it is associated with high risk of occlusion/thrombosis. However patient did not follow up with vascular surgery.  Continue port flush.   Iron deficiency anemia Check iron tibc ferritin.  Lab Results  Component Value Date   IRON 82 04/11/2023   TIBC 379 04/11/2023   IRONPCTSAT 22 04/11/2023   FERRITIN 61 04/11/2023    Improved hemoglobin and iron store  Blood in stool Refer to GI, he did not establish care    Orders Placed This Encounter  Procedures   NM PET Image Restag (PS) Skull Base To Thigh    Standing Status:   Future    Standing Expiration Date:   05/01/2024    Order Specific Question:   If indicated for the ordered procedure, I authorize the administration of a radiopharmaceutical per Radiology protocol    Answer:   Yes    Order Specific Question:   Preferred imaging location?    Answer:   [facility]   Hepatitis panel, acute    Standing Status:   Future    Number of Occurrences:   1    Standing Expiration Date:   05/01/2024    Sedimentation rate    Standing Status:   Future    Number of Occurrences:   1    Standing Expiration Date:   05/01/2024   CBC (Cancer Center Only)    Standing Status:   Future    Number of Occurrences:   1    Standing Expiration Date:   05/01/2024   Retic Panel    Standing Status:   Future    Number of Occurrences:   1    Standing Expiration Date:   05/01/2024    Follow up TBD, depending on PET scan results.   All questions were answered. The patient knows to call the clinic with any problems, questions or concerns.  Rickard Patience, MD, PhD [facility] Hematology Oncology 05/02/2023     HISTORY OF PRESENTING ILLNESS:   Preston [name] is a  37 y.o.  male with PMH listed below was seen in consultation at the request of  No ref. provider found  for evaluation of Hodgkin's lymphoma.  Patient has a known diagnosis of Hodgkin's lymphoma and wants to transfer his oncology care to our cancer center.  Extensive medical records reviewed was performed  Previous oncology care was [facility] Per note  10/28/2019 biopsy of right chest wall mass, fibromuscular soft tissue with atypical infiltrate consistent with Hodgkin lymphoma, classical type. There were scattered single atypical cells with morphology suggestive of Sharmon Revere variant cells. These cells were reactive with  CD 45, CD 15, CD 30 and nonreactive for pan keratin, cam 5.7, CD3, CD5, CD10, CD 20, melan-A, Sox 10. Ki-67 stained majority of Reed-Sternberg like cells.  Noticed right upper chest wall lump in January 2021. He was incarcerated for 27 months, released in May 2021.  Unintentional weight loss about 30 pounds, drenching night sweats.  01/17/2020 PET scan showed bulky anterior mediastinal mass 12.7cm. with extensive adenopathy in neck, right axilla, mediastinum, bilateral hilar regions, upper abdominal ligament adenopathy and retroperitoneal adenopathy. Abnormal hypermetabolic activity within a splenic focus.  Mild patchy ground glass opacity in the right lower lobe may reflect postobstructive inflammatory change without hypermetabolic activity.   01/17/2020 Echo 1. Normal biventricular systolic function and size. LVEF 60%. Average global longitudinal strain is normal, -20%   2. No significant valvular dysfunction.   3. Normal right-and left-sided filling pressures.  4. No prior study for comparison.  12/30/2019 LDH 588 01/24/2020 CRP 67.2, ESR 60  Stage IIIB Hodgkin's lymphoma, he was recommended ABVD x 2 cycles, followed by PET with plan of AVD x 4 or escalate to BEACOPP if not favorable results.   01/26/2020 ABVD Day 1,15 x 2 cycles.   03/24/2020 PET scan showed significant interval response to therapy. All PET positive lesions previously resolved 03/30/2020 AVD D1 04/13/2020 AVD D15  # He was not able to have chest medi port placed due to chest mass. He has central line placed on left upper extremity. #Central line access.  Patient has seen Dr. Wyn Quaker for evaluation of the left upper extremity central line.  I discussed with Dr. Wyn Quaker and tip is in good position in the SVC.  Okay to use from vascular surgeon's aspect.  [facility] pathology consultation on biopsy specimen from 10/28/2019 showed findings compatible with classic Hodgkin's lymphoma.  # 06/26/2020 - 11/29/20201 - Cycle 4 AVD  He no showed for his chemotherapy appointment in mid December 2021.    08/07/2020 seen at that time he reports acute onset of chest pain, left shoulder, back and left arm. He was sent to emergency room to have a CT scan done to rule out acute pulmonary embolism 08/07/2020, CT chest angiogram showed no pulmonary emboli or acute chest vascular pathology. Superior mediastinal lymphadenopathy consistent with clinical history of lymphoma.  Largest node measures 8.3 x 8.3 x 3.8 cm.  Several other smaller but pathologic lymph nodes in the anterior mediastinum, second largest at the aortopulmonary window measuring 1.3 x 1.9 x 3.1 cm.   No pulmonary mass or nodule.  Patient also had negative troponin,Patient was discharged home for further follow-up with cancer center.  ER physician provided a prescription of Percocet 5/325 mg 1 tablet every 4 hours as needed for severe pain.  I obtained additional work-up for work-up to see if there is lymphoma progression.  08/23/2020 PET scan showed dominant right paramidline anterior mediastinal mass lesion is stable in size comparing to her outside PET scan on 03/24/2020.  Lesion showed low-level FDG uptake [place] 3, not appreciably changed in the interval.  Mildly enlarged hepatoduodenal ligament lymph node measures minimally smaller on CT imaging today with [place] 2 uptake.  No additional area of unexpected or suspicious hypermetabolic zone.  No other findings of lymphadenopathy on noncontrast CT image today.  Patient was recommended to proceed with 2D echocardiogram and he had Echo done on 09/08/2020. The delay was due to him not showing up to appointment.  #09/08/2020  2D echocardiogram showed low normal LVEF 50% to 55%.  This is slightly decreased  from his outside echocardiogram in June 2021 with LVEF of 60%.  09/01/2020- 09/15/2020 Cycle 5 AVD 2/222022 Cycle 6 D1 AVD  Neck shoulder pain worsen.   #10/18/2020  MRI cervical spine showed bulky degenerative disc extrusion at C5-C6 with severe spinal stenosis, spinal cord mass-effect and abnormal cord signal (edema and/or myelomalacia). Moderate to severe multifactorial right C6 foraminal stenosis. Smaller disc herniation at C3-C4, and lobulated disc bulging at C4-C5 with up to mild spinal stenosis at those levels. Up to moderate degenerative right C4, left C7, and moderate to severe bilateral C8 neural foraminal stenosis. 10/20/20 Patient underwent  C5-6 anterior cervical discectomy and fusion    11/10/2020 he was seen in the clinic prior to chemotherapy. At that time, he felt weak and has had weight loss due to his neck surgery. Therefore decision  was made to hold chemo and give him 1-2 weeks for recovery.  Then he had multiple no show for chemotherapy treatment appointment.   # 5/26/.2022 cycle 6 D15 AVD.  #09/10/2021 CT chest abdomen pelvis w contrast showed stable mediastinal adenopathy, no progressive findings.  Stable low-attenuation celiac axis node.  No mesenteric or retroperitoneal or pelvic adenopathy.  Stable diffuse fatty infiltration of the liver.  Stable renal cyst.  Age advanced coronary artery calcifications.  # 03/12/22 CT chest abdomen pelvis w contrast showed 1. Slight interval increase in size of the dominant anterior mediastinal nodal tissue. 2. Additional anterior mediastinal lymph nodes and the low-density celiac axis lymph node are stable. 3. No new areas of adenopathy in the chest, abdomen or pelvis.  09/23/2022 CT chest abdomen pelvis w contrast showed 1. Decreased size of the dominant anterior mediastinal mass/lymph node conglomerate and adjacent mediastinal lymph nodes. 2. Stable low-attenuation celiac axis lymph nodes. 3. No new or progressive adenopathy in the chest, abdomen or pelvis and no splenomegaly. 4.  Aortic Atherosclerosis  INTERVAL HISTORY Preston [name] is a 37 y.o. male who has above history reviewed by me today presents for follow up visit for management of Hodgkin's  Lymphoma Patient is not compliant with his follow-up and no-show to his follow-up appointment.. He has gained 4 pounds since his last visit 3 months ago. Patient reports pain of right rib cage area. Night sweats, 4-5 times per week.  01/21/2023 CT chest w contrast  Stable mediastinal mass and other mediastinal lymph nodes. No new mass lesion, fluid collection or lymph node enlargement in the thorax.Fatty liver infiltration.Coronary artery calcifications. Please correlate for other coronary risk factors.   Aortic Atherosclerosis  04/12/2023 CT chest abdomen pelvis showed 1. Increased size of the dominant anterior mediastinal  mass and mediastinal lymph nodes, compatible with progressive lymphomatous disease 2. No lymphadenopathy below the diaphragm and no splenomegaly. 3.  Aortic Atherosclerosis (ICD10-I70.0)    Review of Systems  Constitutional:  Negative for appetite change, chills, fatigue, fever and unexpected weight change.  HENT:   Negative for hearing loss and voice change.   Eyes:  Negative for eye problems and icterus.  Respiratory:  Negative for chest tightness, cough and shortness of breath.   Cardiovascular:  Negative for leg swelling.  Gastrointestinal:  Negative for abdominal distention, abdominal pain and nausea.  Endocrine: Negative for hot flashes.  Genitourinary:  Negative for difficulty urinating, dysuria and frequency.   Musculoskeletal:  Positive for back pain and neck pain. Negative for arthralgias.       Right rib cage pain  Skin:  Negative for itching and rash.  Neurological:  Positive for numbness. Negative for  light-headedness.  Hematological:  Negative for adenopathy. Does not bruise/bleed easily.  Psychiatric/Behavioral:  Negative for confusion.     MEDICAL HISTORY:  Past Medical History:  Diagnosis Date   Alcohol abuse    Chronic low back pain    Hodgkin's lymphoma (HCC)    Stenosis of cervical spine    Left   Tobacco use     SURGICAL HISTORY: Past Surgical History:  Procedure Laterality Date   ANTERIOR CERVICAL DECOMP/DISCECTOMY FUSION N/A 10/20/2020   Procedure: ANTERIOR CERVICAL DECOMPRESSION/DISCECTOMY FUSION 1 LEVEL C5-C6;  Surgeon: Venetia Night, MD;  Location: [facility] ORS;  Service: Neurosurgery;  Laterality: N/A;    SOCIAL HISTORY: Social History   Socioeconomic History   Marital status: Married    Spouse name: Not on file   Number of children: Not on file   Years of education: Not on file   Highest education level: Not on file  Occupational History   Not on file  Tobacco Use   Smoking status: Every Day    Current packs/day: 0.50    Average  packs/day: 0.5 packs/day for 20.0 years (10.0 ttl pk-yrs)    Types: Cigarettes   Smokeless tobacco: Never  Vaping Use   Vaping status: Never Used  Substance and Sexual Activity   Alcohol use: Yes    Alcohol/week: 3.0 standard drinks of alcohol    Types: 3 Cans of beer per week    Comment: daily   Drug use: Not Currently   Sexual activity: Yes    Partners: Female  Other Topics Concern   Not on file  Social History Narrative   Not on file   Social Determinants of Health   Financial Resource Strain: Unknown (03/29/2020)   Received from [facility]   Overall Financial Resource Strain (CARDIA)    Difficulty of Paying Living Expenses: Patient declined  Food Insecurity: No Food Insecurity (12/28/2022)   Hunger Vital Sign    Worried About Running Out of Food in the Last Year: Never true    Ran Out of Food in the Last Year: Never true  Transportation Needs: No Transportation Needs (12/28/2022)   PRAPARE - Administrator, Civil Service (Medical): No    Lack of Transportation (Non-Medical): No  Physical Activity: Not on file  Stress: Not on file  Social Connections: Unknown (10/08/2022)   Received from [facility]   Social Network    Social Network: Not on file  Intimate Partner Violence: Not At Risk (12/28/2022)   Humiliation, Afraid, Rape, and Kick questionnaire    Fear of Current or Ex-Partner: No    Emotionally Abused: No    Physically Abused: No    Sexually Abused: No    FAMILY HISTORY: Family History  Problem Relation Age of Onset   Heart failure Mother    Pneumonia Father    Alcohol abuse Brother     ALLERGIES:  has No Known Allergies.  MEDICATIONS:  Current Outpatient Medications  Medication Sig Dispense Refill   acetaminophen (TYLENOL) 500 MG tablet Take 1,000 mg by mouth every 6 (six) hours as needed for mild pain or moderate pain.     gabapentin (NEURONTIN) 300 MG capsule Take 1 capsule by  mouth twice daily for 7 days then increase to 3 times per day 90 capsule 2   ibuprofen (ADVIL) 200 MG tablet Take 600-800 mg by mouth every 6 (six) hours as needed for mild pain or moderate pain.  iron polysaccharides (NIFEREX) 150 MG capsule Take 1 capsule (150 mg total) by mouth 2 (two) times daily. 120 capsule 1   traMADol (ULTRAM) 50 MG tablet Take 1 tablet (50 mg total) by mouth every 8 (eight) hours as needed. 60 tablet 0   No current facility-administered medications for this visit.   Facility-Administered Medications Ordered in Other Visits  Medication Dose Route Frequency Provider Last Rate Last Admin   heparin lock flush 100 unit/mL  500 Units Intravenous Once Rickard Patience, MD       sodium chloride flush (NS) 0.9 % injection 10 mL  10 mL Intravenous PRN Rickard Patience, MD   10 mL at 08/07/20 0826     PHYSICAL EXAMINATION: ECOG PERFORMANCE STATUS: 0 - Asymptomatic Vitals:   05/02/23 1102  BP: (!) 134/95  Pulse: 86  Resp: 18  Temp: 98 F (36.7 C)   Filed Weights   05/02/23 1102  Weight: 144 lb (65.3 kg)    Physical Exam Constitutional:      General: He is not in acute distress. HENT:     Head: Normocephalic and atraumatic.  Eyes:     General: No scleral icterus. Cardiovascular:     Rate and Rhythm: Normal rate and regular rhythm.     Heart sounds: Normal heart sounds.  Pulmonary:     Effort: Pulmonary effort is normal. No respiratory distress.     Breath sounds: No wheezing.  Abdominal:     General: Bowel sounds are normal. There is no distension.     Palpations: Abdomen is soft.  Musculoskeletal:        General: No deformity. Normal range of motion.     Cervical back: Normal range of motion and neck supple.  Skin:    General: Skin is warm and dry.     Findings: No erythema or rash.  Neurological:     Mental Status: He is alert and oriented to person, place, and time. Mental status is at baseline.     Cranial Nerves: No cranial nerve deficit.     Coordination:  Coordination normal.  Psychiatric:        Mood and Affect: Mood normal.   left upper arm central line port.      LABORATORY DATA:  I have reviewed the data as listed    Latest Ref Rng & Units 05/02/2023   11:34 AM 04/11/2023   11:22 AM 03/07/2023    2:03 PM  CBC  WBC 4.0 - 10.5 K/uL 5.0  3.9  5.8   Hemoglobin 13.0 - 17.0 g/dL 84.6  96.2  95.2   Hematocrit 39.0 - 52.0 % 36.3  35.8  29.2   Platelets 150 - 400 K/uL 228  248  240       Latest Ref Rng & Units 04/11/2023   11:22 AM 03/07/2023    2:03 PM 02/21/2023    2:09 PM  CMP  Glucose 70 - 99 mg/dL 841  86  88   BUN 6 - 20 mg/dL 6  9  8    Creatinine 0.61 - 1.24 mg/dL 3.24  4.01  0.27   Sodium 135 - 145 mmol/L 134  136  133   Potassium 3.5 - 5.1 mmol/L 3.8  4.0  3.2   Chloride 98 - 111 mmol/L 102  101  101   CO2 22 - 32 mmol/L 24  23  22    Calcium 8.9 - 10.3 mg/dL 9.1  9.3  8.9   Total Protein 6.5 - 8.1  g/dL 7.2  7.7  7.5   Total Bilirubin 0.3 - 1.2 mg/dL 0.9  1.0  1.4   Alkaline Phos 38 - 126 U/L 40  38  39   AST 15 - 41 U/L 19  26  19    ALT 0 - 44 U/L 17  17  18       RADIOGRAPHIC STUDIES: I have personally reviewed the radiological images as listed and agreed with the findings in the report. CT CHEST ABDOMEN PELVIS W CONTRAST  Result Date: 04/12/2023 CLINICAL DATA:  History of Hodgkin's lymphoma, follow-up. * Tracking Code: BO * EXAM: CT CHEST, ABDOMEN, AND PELVIS WITH CONTRAST TECHNIQUE: Multidetector CT imaging of the chest, abdomen and pelvis was performed following the standard protocol during bolus administration of intravenous contrast. RADIATION DOSE REDUCTION: This exam was performed according to the departmental dose-optimization program which includes automated exposure control, adjustment of the mA and/or kV according to patient size and/or use of iterative reconstruction technique. CONTRAST:  OMNIPAQUE IOHEXOL 300 MG/ML  SOLN COMPARISON:  Multiple priors including CT September 23, 2022 January 15, 2023 and Dec 28, 2022. FINDINGS: CT CHEST FINDINGS Cardiovascular: Left upper extremity PICC with tip in the right atrium. Normal caliber thoracic aorta. No central pulmonary embolus on this nondedicated study. Mediastinum/Nodes: Increased size of the dominant anterior mediastinal mass now measuring 7.1 x 3.8 cm on image 26/2 previously 6.4 x 3.8 cm. Prevascular lymph node measures 9 mm in short axis on image 23/2, unchanged. AP window lymph node conglomerate measures 2.9 x 2.0 cm on image 24/2 previously 2.3 x 1.3 cm. No suspicious thyroid nodule. The esophagus is grossly unremarkable. Lungs/Pleura: No suspicious pulmonary nodules or masses. No pleural effusion. No pneumothorax. Musculoskeletal: No aggressive lytic or blastic lesion of bone CT ABDOMEN PELVIS FINDINGS Hepatobiliary: No suspicious hepatic lesion. Gallbladder is unremarkable. No biliary ductal dilation. Pancreas: No pancreatic ductal dilation or evidence of acute inflammation. Spleen: No splenomegaly. Adrenals/Urinary Tract: Bilateral adrenal glands appear normal. No hydronephrosis. Kidneys demonstrate symmetric enhancement. Urinary bladder is unremarkable. Stomach/Bowel: Stomach is unremarkable for degree of distension. Radiopaque enteric contrast material traverses the rectum. No pathologic dilation of small or large bowel. Moderate volume of formed stool in the colon. Normal appendix. Vascular/Lymphatic: Aortic atherosclerosis. Normal caliber abdominal aorta. Smooth IVC contours. No pathologically enlarged abdominal or pelvic lymph nodes. Reproductive: Prostate is unremarkable. Other: No significant abdominopelvic free fluid. Musculoskeletal: No aggressive lytic or blastic lesion of bone IMPRESSION: 1. Increased size of the dominant anterior mediastinal mass and mediastinal lymph nodes, compatible with progressive lymphomatous disease 2. No lymphadenopathy below the diaphragm and no splenomegaly. 3.  Aortic Atherosclerosis (ICD10-I70.0). These results will be  called to the ordering clinician or representative by the Radiologist Assistant, and communication documented in the PACS or [employer]. Electronically Signed   By: Maudry Mayhew M.D.   On: 04/12/2023 08:58

## 2023-05-02 NOTE — Assessment & Plan Note (Signed)
Refer to GI, he did not establish care

## 2023-05-02 NOTE — Progress Notes (Signed)
Pt here for follow up. Pt reports pain to right upper rib area.

## 2023-05-02 NOTE — Assessment & Plan Note (Signed)
New onset left side numbness, June 2024  MRI cervical, thoracic, lumbar spine results were reviewed with patient.  I ask patient to contact neurosurgeon for evaluation.  Tramadol 50mg  Q8h PRN for pain.

## 2023-05-07 ENCOUNTER — Ambulatory Visit
Admission: RE | Admit: 2023-05-07 | Discharge: 2023-05-07 | Disposition: A | Discharge status: Home / Self Care | Payer: Medicaid Other | Source: Ambulatory Visit | Attending: Oncology | Admitting: Oncology

## 2023-05-07 DIAGNOSIS — C8170 Other classical Hodgkin lymphoma, unspecified site: Secondary | ICD-10-CM | POA: Diagnosis present

## 2023-05-07 LAB — GLUCOSE, CAPILLARY: Glucose-Capillary: 90 mg/dL (ref 70–99)

## 2023-05-07 MED ORDER — FLUDEOXYGLUCOSE F - 18 (FDG) INJECTION
7.5000 | Freq: Once | INTRAVENOUS | Status: AC | PRN
  Administered 2023-05-07: 8.02 via INTRAVENOUS

## 2023-05-13 ENCOUNTER — Encounter: Payer: Self-pay | Admitting: Gastroenterology

## 2023-05-13 ENCOUNTER — Ambulatory Visit: Payer: Medicaid Other | Admitting: Gastroenterology

## 2023-05-13 NOTE — Progress Notes (Deleted)
Gastroenterology Consultation  Referring Provider:     Rushie Chestnut, PA-C Primary Care Physician:  Patient, No Pcp Per Primary Gastroenterologist:  Dr. Servando Snare     Reason for Consultation:     Iron deficient anemia        HPI:   Preston [name] is a 37 y.o. y/o male referred for consultation & management of iron deficiency anemia by Dr. Patient, No Pcp Per.  ***  Component     Latest Ref Rng 12/05/2022 02/21/2023 04/11/2023  Iron     45 - 182 ug/dL 31 (L)  53  82   TIBC     250 - 450 ug/dL 161 (H)  096 (H)  045   Saturation Ratios     17.9 - 39.5 % 7 (L)  11 (L)  22     Past Medical History:  Diagnosis Date   Alcohol abuse    Chronic low back pain    Hodgkin's lymphoma (HCC)    Stenosis of cervical spine    Left   Tobacco use     Past Surgical History:  Procedure Laterality Date   ANTERIOR CERVICAL DECOMP/DISCECTOMY FUSION N/A 10/20/2020   Procedure: ANTERIOR CERVICAL DECOMPRESSION/DISCECTOMY FUSION 1 LEVEL C5-C6;  Surgeon: Venetia Night, MD;  Location: [facility] ORS;  Service: Neurosurgery;  Laterality: N/A;    Prior to Admission medications   Medication Sig Start Date End Date Taking? Authorizing Provider  acetaminophen (TYLENOL) 500 MG tablet Take 1,000 mg by mouth every 6 (six) hours as needed for mild pain or moderate pain.    [provider]  gabapentin (NEURONTIN) 300 MG capsule Take 1 capsule by mouth twice daily for 7 days then increase to 3 times per day 03/07/23   Rushie Chestnut, PA-C  ibuprofen (ADVIL) 200 MG tablet Take 600-800 mg by mouth every 6 (six) hours as needed for mild pain or moderate pain.    [provider]  iron polysaccharides (NIFEREX) 150 MG capsule Take 1 capsule (150 mg total) by mouth 2 (two) times daily. 12/06/22   Rickard Patience, MD  traMADol (ULTRAM) 50 MG tablet Take 1 tablet (50 mg total) by mouth every 8 (eight) hours as needed. 05/02/23   Rickard Patience, MD    Family History  Problem Relation Age of Onset   Heart  failure Mother    Pneumonia Father    Alcohol abuse Brother      Social History   Tobacco Use   Smoking status: Every Day    Current packs/day: 0.50    Average packs/day: 0.5 packs/day for 20.0 years (10.0 ttl pk-yrs)    Types: Cigarettes   Smokeless tobacco: Never  Vaping Use   Vaping status: Never Used  Substance Use Topics   Alcohol use: Yes    Alcohol/week: 3.0 standard drinks of alcohol    Types: 3 Cans of beer per week    Comment: daily   Drug use: Not Currently    Allergies as of 05/13/2023   (No Known Allergies)    Review of Systems:    All systems reviewed and negative except where noted in HPI.   Physical Exam:  There were no vitals taken for this visit. No LMP for male patient. General:   Alert,  Well-developed, well-nourished, pleasant and cooperative in NAD Head:  Normocephalic and atraumatic. Eyes:  Sclera clear, no icterus.   Conjunctiva pink. Ears:  Normal auditory acuity. Neck:  Supple; no masses or thyromegaly. Lungs:  Respirations even and  unlabored.  Clear throughout to auscultation.   No wheezes, crackles, or rhonchi. No acute distress. Heart:  Regular rate and rhythm; no murmurs, clicks, rubs, or gallops. Abdomen:  Normal bowel sounds.  No bruits.  Soft, non-tender and non-distended without masses, hepatosplenomegaly or hernias noted.  No guarding or rebound tenderness.  Negative Carnett sign.   Rectal:  Deferred.  Pulses:  Normal pulses noted. Extremities:  No clubbing or edema.  No cyanosis. Neurologic:  Alert and oriented x3;  grossly normal neurologically. Skin:  Intact without significant lesions or rashes.  No jaundice. Lymph Nodes:  No significant cervical adenopathy. Psych:  Alert and cooperative. Normal mood and affect.  Imaging Studies: No results found.  Assessment and Plan:   Preston [name] is a 37 y.o. y/o male ***    Midge Minium, MD. Clementeen Graham    Note: This dictation was prepared with Dragon dictation along with smaller  phrase technology. Any transcriptional errors that result from this process are unintentional.

## 2023-05-15 ENCOUNTER — Ambulatory Visit: Payer: Medicaid Other | Admitting: Gastroenterology

## 2023-05-26 ENCOUNTER — Telehealth: Payer: Self-pay | Admitting: Oncology

## 2023-05-26 NOTE — Telephone Encounter (Signed)
Patient called requesting PET results- Done 9/25.  He would like a call back-  Thank you

## 2023-05-27 NOTE — Telephone Encounter (Signed)
Attempted to call pt, no answer and VM is full. He will also need additional port flushes every 8 weeks until visit in 6  months. Next port flush scheduled in Nov.

## 2023-05-28 ENCOUNTER — Other Ambulatory Visit: Payer: Self-pay

## 2023-05-28 ENCOUNTER — Emergency Department: Payer: Medicaid Other

## 2023-05-28 ENCOUNTER — Emergency Department
Admission: EM | Admit: 2023-05-28 | Discharge: 2023-05-29 | Disposition: A | Discharge status: Home / Self Care | Payer: Medicaid Other | Attending: Emergency Medicine | Admitting: Emergency Medicine

## 2023-05-28 DIAGNOSIS — S0083XA Contusion of other part of head, initial encounter: Secondary | ICD-10-CM | POA: Diagnosis not present

## 2023-05-28 DIAGNOSIS — S0990XA Unspecified injury of head, initial encounter: Secondary | ICD-10-CM

## 2023-05-28 DIAGNOSIS — W1830XA Fall on same level, unspecified, initial encounter: Secondary | ICD-10-CM | POA: Diagnosis not present

## 2023-05-28 DIAGNOSIS — R531 Weakness: Secondary | ICD-10-CM | POA: Insufficient documentation

## 2023-05-28 DIAGNOSIS — M5442 Lumbago with sciatica, left side: Secondary | ICD-10-CM | POA: Insufficient documentation

## 2023-05-28 DIAGNOSIS — C8170 Other classical Hodgkin lymphoma, unspecified site: Secondary | ICD-10-CM

## 2023-05-28 DIAGNOSIS — W19XXXA Unspecified fall, initial encounter: Secondary | ICD-10-CM

## 2023-05-28 LAB — CBC
HCT: 43.9 % (ref 39.0–52.0)
Hemoglobin: 15.7 g/dL (ref 13.0–17.0)
MCH: 29.7 pg (ref 26.0–34.0)
MCHC: 35.8 g/dL (ref 30.0–36.0)
MCV: 83.1 fL (ref 80.0–100.0)
Platelets: 312 10*3/uL (ref 150–400)
RBC: 5.28 MIL/uL (ref 4.22–5.81)
RDW: 15.1 % (ref 11.5–15.5)
WBC: 6.5 10*3/uL (ref 4.0–10.5)
nRBC: 0 % (ref 0.0–0.2)

## 2023-05-28 LAB — URINALYSIS, ROUTINE W REFLEX MICROSCOPIC
Bilirubin Urine: NEGATIVE
Glucose, UA: NEGATIVE mg/dL
Ketones, ur: 20 mg/dL — AB
Leukocytes,Ua: NEGATIVE
Nitrite: NEGATIVE
Protein, ur: NEGATIVE mg/dL
RBC / HPF: 0 RBC/hpf (ref 0–5)
Specific Gravity, Urine: 1.009 (ref 1.005–1.030)
pH: 5 (ref 5.0–8.0)

## 2023-05-28 LAB — BASIC METABOLIC PANEL
Anion gap: 17 — ABNORMAL HIGH (ref 5–15)
BUN: 9 mg/dL (ref 6–20)
CO2: 22 mmol/L (ref 22–32)
Calcium: 9.3 mg/dL (ref 8.9–10.3)
Chloride: 95 mmol/L — ABNORMAL LOW (ref 98–111)
Creatinine, Ser: 0.73 mg/dL (ref 0.61–1.24)
GFR, Estimated: >60 mL/min (ref >60)
Glucose, Bld: 72 mg/dL (ref 70–99)
Potassium: 5.1 mmol/L (ref 3.5–5.1)
Sodium: 134 mmol/L — ABNORMAL LOW (ref 135–145)

## 2023-05-28 MED ORDER — SODIUM CHLORIDE 0.9 % IV BOLUS
500.0000 mL | Freq: Once | INTRAVENOUS | Status: AC
  Administered 2023-05-28: 500 mL via INTRAVENOUS

## 2023-05-28 MED ORDER — OXYCODONE-ACETAMINOPHEN 5-325 MG PO TABS
1.0000 | ORAL_TABLET | Freq: Once | ORAL | Status: AC
  Administered 2023-05-28: 1 via ORAL
  Filled 2023-05-28: qty 1

## 2023-05-28 NOTE — Telephone Encounter (Signed)
Pt informed and AVS will be mailed out per pt request

## 2023-05-28 NOTE — ED Triage Notes (Addendum)
Pt to ED via [facility] c/o weakness X1week. Pt reports having "low mobility" and unable to get around. Pt fell day before yesterday and hit his head, no loc. Pt usually ambulates with no assistance. Pt having problems moving left leg up, has been going on for a year. Pt has spinal surgery about 3 years ago. Denies CP, SOB, fevers Hx of hodgkins lymphoma, hasn't had chemo in a year.

## 2023-05-28 NOTE — ED Provider Notes (Signed)
[facility] Provider Note    Event Date/Time   First MD Initiated Contact with Patient 05/28/23 2108     (approximate)   History   Weakness   HPI  Preston [name] is a 37 y.o. male past medical history significant for malignant lymphoma, Hodgkin's type, cervical stenosis status post surgery 3 years ago, presents to the emergency department following a fall with weakness.  Patient states that he came in today because he was having new lower back pain.  States that he has been having multiple frequent falls and that is been ongoing for quite some time.  States that he has significant weakness and numbness sensation to his left leg.  States that that is been ongoing for the past 1 year.  States that he is unable to ambulate and usually requires his children or girlfriend to help him get around.  States that he has had more frequent falls over the past 1 week.  Over the past 1 month has been having urinary incontinence.  Denies any burning with urination.  Does state that he had a new fall 1 week ago where he hit his head.  Denies hitting his back.  Recently had a PET scan for concern for progression of his lymphoma.  States that he has been having ongoing left arm weakness since his cervical spine surgery 3 years ago with Dr. Marcell Barlow.  Denies any IV drug use.  Not on anticoagulation.     Physical Exam   Triage Vital Signs: ED Triage Vitals  Encounter Vitals Group     BP 05/28/23 2018 117/76     Systolic BP Percentile --      Diastolic BP Percentile --      Pulse Rate 05/28/23 2018 100     Resp 05/28/23 2018 20     Temp 05/28/23 2023 98.1 F (36.7 C)     Temp Source 05/28/23 2023 Oral     SpO2 05/28/23 2018 96 %     Weight 05/28/23 2019 144 lb (65.3 kg)     Height 05/28/23 2019 5\' 10"  (1.778 m)     Head Circumference --      Peak Flow --      Pain Score 05/28/23 2019 8     Pain Loc --      Pain Education --      Exclude from Growth Chart --      Most recent vital signs: Vitals:   05/28/23 2018 05/28/23 2023  BP: 117/76   Pulse: 100   Resp: 20   Temp:  98.1 F (36.7 C)  SpO2: 96%     Physical Exam Constitutional:      Appearance: He is well-developed.  HENT:     Head:     Comments: Ecchymosis to the left side of the face    Mouth/Throat:     Mouth: Mucous membranes are moist.  Eyes:     Extraocular Movements: Extraocular movements intact.     Conjunctiva/sclera: Conjunctivae normal.     Pupils: Pupils are equal, round, and reactive to light.  Cardiovascular:     Rate and Rhythm: Regular rhythm.  Pulmonary:     Effort: No respiratory distress.  Abdominal:     Tenderness: There is no abdominal tenderness.  Musculoskeletal:     Cervical back: Normal range of motion. Tenderness present.  Skin:    General: Skin is warm.     Capillary Refill: Capillary refill takes less than 2 seconds.  Neurological:     Mental Status: He is alert.     GCS: GCS eye subscore is 4. GCS verbal subscore is 5. GCS motor subscore is 6.     Cranial Nerves: Cranial nerves 2-12 are intact.     Sensory: Sensation is intact.     Motor: Weakness present.     Comments: 1/5 strength to the left lower extremity.  4/5 strength to the left upper extremity.  No saddle anesthesia  Psychiatric:        Mood and Affect: Mood normal.     IMPRESSION / MDM / ASSESSMENT AND PLAN / ED COURSE  I reviewed the triage vital signs and the nursing notes.  Differential diagnosis including intracranial hemorrhage, cauda equina/epidural compression syndrome, malignancy with possible metastasis, dehydration   EKG  I, Corena Herter, the attending physician, personally viewed and interpreted this ECG.   Rate: Normal  Rhythm: Normal sinus  Intervals: Prolonged QTc  ST&T Change: Nonspecific ST changes Signs of atrial enlargement  No tachycardic or bradycardic dysrhythmias while on cardiac telemetry.  RADIOLOGY I independently reviewed imaging, my  interpretation of imaging: CT scan of the head with no signs of intracranial hemorrhage or infarction.  Read as no acute findings.  Ordered CT scan of the cervical spine, face and MRI of the cervical, thoracic and lumbar spine to evaluate for traumatic injury, discitis, malignancy, compression syndrome  LABS (all labs ordered are listed, but only abnormal results are displayed) Labs interpreted as -    Labs Reviewed  BASIC METABOLIC PANEL - Abnormal; Notable for the following components:      Result Value   Sodium 134 (*)    Chloride 95 (*)    Anion gap 17 (*)    All other components within normal limits  URINALYSIS, ROUTINE W REFLEX MICROSCOPIC - Abnormal; Notable for the following components:   Color, Urine YELLOW (*)    APPearance CLEAR (*)    Hgb urine dipstick SMALL (*)    Ketones, ur 20 (*)    Bacteria, UA RARE (*)    All other components within normal limits  CBC     MDM    No signs of urinary tract infection.  No significant electrolyte abnormality.  Did have an elevated anion gap of 17 which is likely in the setting of ketosis, does have ketones in his urine.  Given a 500 bolus.  Given p.o. Percocet for lower back pain.  Currently MRIs are pending and care transferred to incoming provider.   PROCEDURES:  Critical Care performed: No  Procedures  Patient's presentation is most consistent with acute presentation with potential threat to life or bodily function.   MEDICATIONS ORDERED IN ED: Medications  sodium chloride 0.9 % bolus 500 mL (500 mLs Intravenous New Bag/Given 05/28/23 2233)  oxyCODONE-acetaminophen (PERCOCET/ROXICET) 5-325 MG per tablet 1 tablet (1 tablet Oral Given 05/28/23 2235)    FINAL CLINICAL IMPRESSION(S) / ED DIAGNOSES   Final diagnoses:  Weakness  Fall, initial encounter  Traumatic injury of head, initial encounter  Acute bilateral low back pain with left-sided sciatica     Rx / DC Orders   ED Discharge Orders     None         Note:  This document was prepared using Dragon voice recognition software and may include unintentional dictation errors.   Corena Herter, MD 05/29/23 0020

## 2023-05-29 MED ORDER — LIDOCAINE 5 % EX PTCH: 1.0000 | MEDICATED_PATCH | Freq: Two times a day (BID) | CUTANEOUS | 0 refills | Status: DC

## 2023-05-29 MED ORDER — LIDOCAINE 5 % EX PTCH
1.0000 | MEDICATED_PATCH | CUTANEOUS | Status: DC
  Administered 2023-05-29: 1 via TRANSDERMAL
  Filled 2023-05-29: qty 1

## 2023-05-29 MED ORDER — KETOROLAC TROMETHAMINE 30 MG/ML IJ SOLN
15.0000 mg | Freq: Once | INTRAMUSCULAR | Status: AC
  Administered 2023-05-29: 15 mg via INTRAVENOUS
  Filled 2023-05-29: qty 1

## 2023-05-29 MED ORDER — MORPHINE SULFATE (PF) 4 MG/ML IV SOLN
4.0000 mg | Freq: Once | INTRAVENOUS | Status: AC
  Administered 2023-05-29: 4 mg via INTRAVENOUS
  Filled 2023-05-29: qty 1

## 2023-05-29 NOTE — Discharge Instructions (Signed)
Use Tylenol for pain and fevers.  Up to 1000 mg per dose, up to 4 times per day.  Do not take more than 4000 mg of Tylenol/acetaminophen within 24 hours..  Please use lidocaine patches at your site of pain.  Apply 1 patch at a time, leave on for 12 hours, then remove for 12 hours.  12 hours on, 12 hours off.  Do not apply more than 1 patch at a time.

## 2023-05-29 NOTE — ED Notes (Signed)
Patient resting in bed free from sign of distress. Breathing unlabored speaking in full sentences with symmetric chest rise and fall. Bed low and locked with side rails raised x2. Call bell in reach and monitor in place.   

## 2023-05-29 NOTE — ED Provider Notes (Signed)
Patient received in signout from Dr. Arnoldo Morale pending MRI reads of his whole spine.  Patient has had left leg weakness for multiple years since a cervical ACDF.  Reports increasing falls past couple months, increased acute L > R lumbar pain precipitated his presentation today.  His MRIs demonstrate no acute features, similar as comparisons from this past May.  Reports he has a previously scheduled for spine surgery clinic visit next week.  He acknowledges chronic weakness without any acute changes tonight beyond lumbar pain.   Considering his reassuring MRIs and chronicity of his symptoms, doubt cauda equina, fractures or more severe derangements.  We discussed plan of care and after shared decision making decided upon outpatient management with close spine surgery, PCP follow-up.  We discussed appropriate ED return precautions. I considered admission for this patient.   His pain significantly improves with a lidoderm patch and single dose toradol.    Delton [place], MD 05/29/23 [phone]

## 2023-05-29 NOTE — ED Notes (Signed)
Patient provided with snack tray, orange juice, and water per request and with provider approval. Patient resting in bed free from sign of distress. Breathing unlabored speaking in full sentences with symmetric chest rise and fall. Bed low and locked with side rails raised x2. Call bell in reach and monitor in place.

## 2023-05-29 NOTE — ED Notes (Signed)
Patient resting in bed free from sign of distress. Breathing unlabored speaking in full sentences with symmetric chest rise and fall. Bed low and locked with side rails raised x2. Call bell in reach and monitor in place. Pt is requesting pain medication. MD notified and orders placed.

## 2023-05-29 NOTE — ED Notes (Signed)
Patient resting in bed free from sign of distress. Breathing unlabored speaking in full sentences with symmetric chest rise and fall. Bed low and locked with side rails raised x2. Call bell in reach and monitor in place.

## 2023-05-31 NOTE — Plan of Care (Signed)
[facility] Tonsillectomy/Adenoidectomy, Postoperative PEDS care plan entered in error.

## 2023-06-16 ENCOUNTER — Inpatient Hospital Stay: Payer: Medicaid Other | Attending: Oncology

## 2023-06-16 DIAGNOSIS — C8192 Hodgkin lymphoma, unspecified, intrathoracic lymph nodes: Secondary | ICD-10-CM | POA: Insufficient documentation

## 2023-06-16 DIAGNOSIS — D500 Iron deficiency anemia secondary to blood loss (chronic): Secondary | ICD-10-CM

## 2023-06-16 MED ORDER — HEPARIN SOD (PORK) LOCK FLUSH 100 UNIT/ML IV SOLN
500.0000 [IU] | Freq: Once | INTRAVENOUS | Status: AC | PRN
  Administered 2023-06-16: 500 [IU]
  Filled 2023-06-16: qty 5

## 2023-06-16 MED ORDER — SODIUM CHLORIDE 0.9% FLUSH
10.0000 mL | Freq: Once | INTRAVENOUS | Status: AC | PRN
  Administered 2023-06-16: 10 mL
  Filled 2023-06-16: qty 10

## 2023-08-11 ENCOUNTER — Inpatient Hospital Stay: Payer: Medicaid Other

## 2023-08-15 ENCOUNTER — Inpatient Hospital Stay: Payer: Medicaid Other

## 2023-08-18 ENCOUNTER — Inpatient Hospital Stay: Payer: Medicaid Other | Attending: Oncology

## 2023-10-06 ENCOUNTER — Inpatient Hospital Stay: Payer: Medicaid Other | Attending: Oncology

## 2023-11-28 ENCOUNTER — Inpatient Hospital Stay: Payer: Medicaid Other | Attending: Oncology

## 2023-11-28 ENCOUNTER — Inpatient Hospital Stay: Payer: Medicaid Other | Admitting: Oncology

## 2023-11-28 NOTE — Assessment & Plan Note (Deleted)
#   Hodgkin's lymphoma, classic type S/p 2 cycles of ABVD and 4 cycles of AVD.  Sept 2024 CT showed increased size of anterior mediastinal mass and mediastinal lymph nodes Concerning for disease progression Recommend PET restaging and if confirmed increase activity, recommend biopsy

## 2023-12-01 ENCOUNTER — Encounter: Payer: Self-pay | Admitting: Oncology

## 2023-12-17 ENCOUNTER — Inpatient Hospital Stay: Admitting: Oncology

## 2023-12-17 ENCOUNTER — Inpatient Hospital Stay

## 2023-12-24 ENCOUNTER — Inpatient Hospital Stay: Attending: Oncology

## 2023-12-24 ENCOUNTER — Inpatient Hospital Stay: Admitting: Oncology

## 2023-12-24 NOTE — Assessment & Plan Note (Deleted)
#   Hodgkin's lymphoma, classic type S/p 2 cycles of ABVD and 4 cycles of AVD.  Sept 2024 CT showed increased size of anterior mediastinal mass and mediastinal lymph nodes Concerning for disease progression Recommend PET restaging and if confirmed increase activity, recommend biopsy

## 2024-01-06 ENCOUNTER — Other Ambulatory Visit

## 2024-01-06 ENCOUNTER — Ambulatory Visit: Admitting: Oncology

## 2024-01-13 ENCOUNTER — Inpatient Hospital Stay: Admitting: Oncology

## 2024-01-13 ENCOUNTER — Inpatient Hospital Stay

## 2024-02-02 ENCOUNTER — Encounter: Payer: Self-pay | Admitting: Emergency Medicine

## 2024-02-02 ENCOUNTER — Encounter: Payer: Self-pay | Admitting: Oncology

## 2024-02-02 ENCOUNTER — Other Ambulatory Visit (HOSPITAL_COMMUNITY): Payer: Self-pay

## 2024-02-02 ENCOUNTER — Emergency Department

## 2024-02-02 ENCOUNTER — Observation Stay
Admission: EM | Admit: 2024-02-02 | Discharge: 2024-02-03 | Disposition: A | Discharge status: Home / Self Care | Attending: Internal Medicine | Admitting: Internal Medicine

## 2024-02-02 ENCOUNTER — Observation Stay
Admit: 2024-02-02 | Discharge: 2024-02-02 | Disposition: A | Discharge status: Home / Self Care | Attending: Internal Medicine

## 2024-02-02 ENCOUNTER — Other Ambulatory Visit: Payer: Self-pay

## 2024-02-02 ENCOUNTER — Telehealth (HOSPITAL_COMMUNITY): Payer: Self-pay | Admitting: Pharmacy Technician

## 2024-02-02 DIAGNOSIS — R55 Syncope and collapse: Secondary | ICD-10-CM | POA: Diagnosis not present

## 2024-02-02 DIAGNOSIS — C8190 Hodgkin lymphoma, unspecified, unspecified site: Secondary | ICD-10-CM | POA: Diagnosis not present

## 2024-02-02 DIAGNOSIS — F17200 Nicotine dependence, unspecified, uncomplicated: Secondary | ICD-10-CM | POA: Insufficient documentation

## 2024-02-02 DIAGNOSIS — R739 Hyperglycemia, unspecified: Secondary | ICD-10-CM | POA: Diagnosis not present

## 2024-02-02 DIAGNOSIS — F1290 Cannabis use, unspecified, uncomplicated: Secondary | ICD-10-CM | POA: Insufficient documentation

## 2024-02-02 DIAGNOSIS — Z8571 Personal history of Hodgkin lymphoma: Secondary | ICD-10-CM

## 2024-02-02 DIAGNOSIS — Z86711 Personal history of pulmonary embolism: Secondary | ICD-10-CM | POA: Diagnosis present

## 2024-02-02 DIAGNOSIS — I2699 Other pulmonary embolism without acute cor pulmonale: Secondary | ICD-10-CM | POA: Diagnosis not present

## 2024-02-02 LAB — HEMOGLOBIN A1C
Hgb A1c MFr Bld: 4 % — ABNORMAL LOW (ref 4.8–5.6)
Mean Plasma Glucose: 68.1 mg/dL

## 2024-02-02 LAB — ECHOCARDIOGRAM COMPLETE
AR max vel: 2.57 cm2
AV Area VTI: 2.77 cm2
AV Area mean vel: 2.69 cm2
AV Mean grad: 5 mmHg
AV Peak grad: 11 mmHg
Ao pk vel: 1.66 m/s
Area-P 1/2: 3.61 cm2
Height: 70 in
MV VTI: 1.68 cm2
S' Lateral: 2.8 cm
Weight: 2240 [oz_av]

## 2024-02-02 LAB — URINALYSIS, ROUTINE W REFLEX MICROSCOPIC
Bacteria, UA: NONE SEEN
Bilirubin Urine: NEGATIVE
Glucose, UA: NEGATIVE mg/dL
Hgb urine dipstick: NEGATIVE
Ketones, ur: 20 mg/dL — AB
Leukocytes,Ua: NEGATIVE
Nitrite: NEGATIVE
Protein, ur: 30 mg/dL — AB
Specific Gravity, Urine: 1.019 (ref 1.005–1.030)
pH: 5 (ref 5.0–8.0)

## 2024-02-02 LAB — CBC WITH DIFFERENTIAL/PLATELET
Abs Immature Granulocytes: 0.03 10*3/uL (ref 0.00–0.07)
Basophils Absolute: 0 10*3/uL (ref 0.0–0.1)
Basophils Relative: 0 %
Eosinophils Absolute: 0 10*3/uL (ref 0.0–0.5)
Eosinophils Relative: 0 %
HCT: 34.4 % — ABNORMAL LOW (ref 39.0–52.0)
Hemoglobin: 11.9 g/dL — ABNORMAL LOW (ref 13.0–17.0)
Immature Granulocytes: 0 %
Lymphocytes Relative: 7 %
Lymphs Abs: 0.6 10*3/uL — ABNORMAL LOW (ref 0.7–4.0)
MCH: 25.8 pg — ABNORMAL LOW (ref 26.0–34.0)
MCHC: 34.6 g/dL (ref 30.0–36.0)
MCV: 74.6 fL — ABNORMAL LOW (ref 80.0–100.0)
Monocytes Absolute: 0.7 10*3/uL (ref 0.1–1.0)
Monocytes Relative: 9 %
Neutro Abs: 6.3 10*3/uL (ref 1.7–7.7)
Neutrophils Relative %: 84 %
Platelets: 269 10*3/uL (ref 150–400)
RBC: 4.61 MIL/uL (ref 4.22–5.81)
RDW: 16.5 % — ABNORMAL HIGH (ref 11.5–15.5)
WBC: 7.6 10*3/uL (ref 4.0–10.5)
nRBC: 0 % (ref 0.0–0.2)

## 2024-02-02 LAB — ETHANOL: Alcohol, Ethyl (B): <15 mg/dL (ref <15)

## 2024-02-02 LAB — COMPREHENSIVE METABOLIC PANEL WITH GFR
ALT: 37 U/L (ref 0–44)
AST: 47 U/L — ABNORMAL HIGH (ref 15–41)
Albumin: 4.7 g/dL (ref 3.5–5.0)
Alkaline Phosphatase: 42 U/L (ref 38–126)
Anion gap: 8 (ref 5–15)
BUN: 8 mg/dL (ref 6–20)
CO2: 28 mmol/L (ref 22–32)
Calcium: 9.5 mg/dL (ref 8.9–10.3)
Chloride: 97 mmol/L — ABNORMAL LOW (ref 98–111)
Creatinine, Ser: 0.8 mg/dL (ref 0.61–1.24)
GFR, Estimated: >60 mL/min (ref >60)
Glucose, Bld: 140 mg/dL — ABNORMAL HIGH (ref 70–99)
Potassium: 3.5 mmol/L (ref 3.5–5.1)
Sodium: 133 mmol/L — ABNORMAL LOW (ref 135–145)
Total Bilirubin: 1.9 mg/dL — ABNORMAL HIGH (ref 0.0–1.2)
Total Protein: 8.2 g/dL — ABNORMAL HIGH (ref 6.5–8.1)

## 2024-02-02 LAB — PROTIME-INR
INR: 1 (ref 0.8–1.2)
Prothrombin Time: 13.2 s (ref 11.4–15.2)

## 2024-02-02 LAB — TROPONIN I (HIGH SENSITIVITY)
Troponin I (High Sensitivity): 6 ng/L (ref <18)
Troponin I (High Sensitivity): 5 ng/L (ref <18)

## 2024-02-02 LAB — URINE DRUG SCREEN, QUALITATIVE (ARMC ONLY)
Amphetamines, Ur Screen: NOT DETECTED
Barbiturates, Ur Screen: NOT DETECTED
Benzodiazepine, Ur Scrn: NOT DETECTED
Cannabinoid 50 Ng, Ur ~~LOC~~: POSITIVE — AB
Cocaine Metabolite,Ur ~~LOC~~: NOT DETECTED
MDMA (Ecstasy)Ur Screen: NOT DETECTED
Methadone Scn, Ur: NOT DETECTED
Opiate, Ur Screen: NOT DETECTED
Phencyclidine (PCP) Ur S: NOT DETECTED
Tricyclic, Ur Screen: NOT DETECTED

## 2024-02-02 LAB — MAGNESIUM: Magnesium: 1.9 mg/dL (ref 1.7–2.4)

## 2024-02-02 LAB — TYPE AND SCREEN
ABO/RH(D): O POS
Antibody Screen: NEGATIVE

## 2024-02-02 LAB — HEPARIN LEVEL (UNFRACTIONATED)
Heparin Unfractionated: 0.25 [IU]/mL — ABNORMAL LOW (ref 0.30–0.70)
Heparin Unfractionated: 0.24 [IU]/mL — ABNORMAL LOW (ref 0.30–0.70)

## 2024-02-02 LAB — HIV ANTIBODY (ROUTINE TESTING W REFLEX): HIV Screen 4th Generation wRfx: NONREACTIVE

## 2024-02-02 LAB — CBG MONITORING, ED: Glucose-Capillary: 146 mg/dL — ABNORMAL HIGH (ref 70–99)

## 2024-02-02 LAB — CK: Total CK: 143 U/L (ref 49–397)

## 2024-02-02 LAB — D-DIMER, QUANTITATIVE: D-Dimer, Quant: 0.6 ug{FEU}/mL — ABNORMAL HIGH (ref 0.00–0.50)

## 2024-02-02 MED ORDER — HYDROCODONE-ACETAMINOPHEN 5-325 MG PO TABS: 1.0000 | ORAL_TABLET | ORAL | Status: DC | PRN

## 2024-02-02 MED ORDER — HEPARIN BOLUS VIA INFUSION
1000.0000 [IU] | Freq: Once | INTRAVENOUS | Status: AC
  Administered 2024-02-02: 1000 [IU] via INTRAVENOUS
  Filled 2024-02-02: qty 1000

## 2024-02-02 MED ORDER — HEPARIN BOLUS VIA INFUSION
3800.0000 [IU] | Freq: Once | INTRAVENOUS | Status: AC
  Administered 2024-02-02: 3800 [IU] via INTRAVENOUS
  Filled 2024-02-02: qty 3800

## 2024-02-02 MED ORDER — ONDANSETRON HCL 4 MG/2ML IJ SOLN
4.0000 mg | Freq: Once | INTRAMUSCULAR | Status: AC
  Administered 2024-02-02: 4 mg via INTRAVENOUS
  Filled 2024-02-02: qty 2

## 2024-02-02 MED ORDER — NICOTINE 14 MG/24HR TD PT24
14.0000 mg | MEDICATED_PATCH | Freq: Every day | TRANSDERMAL | Status: DC
  Administered 2024-02-02 – 2024-02-03 (×2): 14 mg via TRANSDERMAL
  Filled 2024-02-02 (×2): qty 1

## 2024-02-02 MED ORDER — IOHEXOL 350 MG/ML SOLN
75.0000 mL | Freq: Once | INTRAVENOUS | Status: AC | PRN
  Administered 2024-02-02: 75 mL via INTRAVENOUS

## 2024-02-02 MED ORDER — SODIUM CHLORIDE 0.9 % IV BOLUS (SEPSIS)
1000.0000 mL | Freq: Once | INTRAVENOUS | Status: AC
  Administered 2024-02-02: 1000 mL via INTRAVENOUS

## 2024-02-02 MED ORDER — ONDANSETRON HCL 4 MG/2ML IJ SOLN: 4.0000 mg | Freq: Four times a day (QID) | INTRAMUSCULAR | Status: DC | PRN

## 2024-02-02 MED ORDER — MORPHINE SULFATE (PF) 2 MG/ML IV SOLN: 2.0000 mg | INTRAVENOUS | Status: DC | PRN

## 2024-02-02 MED ORDER — THIAMINE HCL 100 MG/ML IJ SOLN
100.0000 mg | Freq: Once | INTRAMUSCULAR | Status: AC
  Administered 2024-02-02: 100 mg via INTRAVENOUS
  Filled 2024-02-02: qty 2

## 2024-02-02 MED ORDER — KETOROLAC TROMETHAMINE 30 MG/ML IJ SOLN
30.0000 mg | Freq: Four times a day (QID) | INTRAMUSCULAR | Status: DC | PRN
  Administered 2024-02-02: 30 mg via INTRAVENOUS
  Filled 2024-02-02: qty 1

## 2024-02-02 MED ORDER — ONDANSETRON HCL 4 MG PO TABS
4.0000 mg | ORAL_TABLET | Freq: Four times a day (QID) | ORAL | Status: DC | PRN
Start: 2024-02-02 — End: 2024-02-03

## 2024-02-02 MED ORDER — SODIUM CHLORIDE 0.9% FLUSH
3.0000 mL | Freq: Two times a day (BID) | INTRAVENOUS | Status: DC
  Administered 2024-02-02 – 2024-02-03 (×3): 3 mL via INTRAVENOUS

## 2024-02-02 MED ORDER — HEPARIN (PORCINE) 25000 UT/250ML-% IV SOLN
1350.0000 [IU]/h | INTRAVENOUS | Status: DC
  Administered 2024-02-02: 1100 [IU]/h via INTRAVENOUS
  Filled 2024-02-02 (×2): qty 250

## 2024-02-02 MED ORDER — ENSURE PLUS HIGH PROTEIN PO LIQD
237.0000 mL | Freq: Two times a day (BID) | ORAL | Status: DC
  Administered 2024-02-02 – 2024-02-03 (×2): 237 mL via ORAL

## 2024-02-02 MED ORDER — ACETAMINOPHEN 325 MG PO TABS: 650.0000 mg | ORAL_TABLET | Freq: Four times a day (QID) | ORAL | Status: DC | PRN

## 2024-02-02 MED ORDER — KETOROLAC TROMETHAMINE 30 MG/ML IJ SOLN
30.0000 mg | Freq: Once | INTRAMUSCULAR | Status: AC
  Administered 2024-02-02: 30 mg via INTRAVENOUS
  Filled 2024-02-02: qty 1

## 2024-02-02 MED ORDER — MORPHINE SULFATE (PF) 2 MG/ML IV SOLN
2.0000 mg | INTRAVENOUS | Status: DC | PRN
  Administered 2024-02-02 – 2024-02-03 (×6): 2 mg via INTRAVENOUS
  Filled 2024-02-02 (×6): qty 1

## 2024-02-02 MED ORDER — LACTATED RINGERS IV SOLN: INTRAVENOUS | Status: DC

## 2024-02-02 NOTE — ED Provider Notes (Signed)
 [facility] Provider Note    Event Date/Time   First MD Initiated Contact with Patient 02/02/24 [phone]     (approximate)   History   Loss of Consciousness   HPI  Preston [name] is a 38 y.o. male with history of iron  deficiency anemia, alcohol use disorder, Hodgkin's lymphoma, cervical stenosis status post ACDF who presents to the emergency department after syncopal event.  States he just got done eating dinner and about 10 minutes later he started suddenly feeling lightheaded and got very hot, sweaty and felt like he was going to pass out.  He had an episode of loss of consciousness where he fell into the floor.  It is unclear if there was any witnessed seizure activity with there was no tongue biting or incontinence.  He reports he has had a similar episode but this was many years ago.  Denies any chest pain or shortness of breath.  No history of ACS, arrhythmia, PE or DVT.  No calf tenderness or calf swelling.  He did have vomiting after this episode.  No diarrhea.  Reports seeing some blood in his stool a week ago but none since.  No melena.  He complains of neck pain but states this is chronic.  He takes gabapentin .  He denies any alcohol today.  Denies drug use.   History provided by patient, EMS.    Past Medical History:  Diagnosis Date   Alcohol abuse    Chronic low back pain    Hodgkin's lymphoma (HCC)    Stenosis of cervical spine    Left   Tobacco use     Past Surgical History:  Procedure Laterality Date   ANTERIOR CERVICAL DECOMP/DISCECTOMY FUSION N/A 10/20/2020   Procedure: ANTERIOR CERVICAL DECOMPRESSION/DISCECTOMY FUSION 1 LEVEL C5-C6;  Surgeon: Clois Fret, MD;  Location: [facility] ORS;  Service: Neurosurgery;  Laterality: N/A;    MEDICATIONS:  Prior to Admission medications   Medication Sig Start Date End Date Taking? Authorizing Provider  acetaminophen  (TYLENOL ) 500 MG tablet Take 1,000 mg by mouth every 6 (six) hours as needed  for mild pain or moderate pain.    [provider]  gabapentin  (NEURONTIN ) 300 MG capsule Take 1 capsule by mouth twice daily for 7 days then increase to 3 times per day 03/07/23   Tonette Lauraine HERO, PA-C  ibuprofen (ADVIL) 200 MG tablet Take 600-800 mg by mouth every 6 (six) hours as needed for mild pain or moderate pain.    [provider]  iron  polysaccharides (NIFEREX) 150 MG capsule Take 1 capsule (150 mg total) by mouth 2 (two) times daily. 12/06/22   Babara Call, MD  lidocaine  (LIDODERM ) 5 % Place 1 patch onto the skin every 12 (twelve) hours. Remove & Discard patch within 12 hours or as directed by MD 05/29/23 05/28/24  Claudene Rover, MD  traMADol  (ULTRAM ) 50 MG tablet Take 1 tablet (50 mg total) by mouth every 8 (eight) hours as needed. 05/02/23   Babara Call, MD    Physical Exam   Triage Vital Signs: ED Triage Vitals  Encounter Vitals Group     BP 02/02/24 0214 114/77     Girls Systolic BP Percentile --      Girls Diastolic BP Percentile --      Boys Systolic BP Percentile --      Boys Diastolic BP Percentile --      Pulse Rate 02/02/24 0214 69     Resp 02/02/24 0214 18  Temp 02/02/24 0214 98 F (36.7 C)     Temp Source 02/02/24 0214 Oral     SpO2 02/02/24 0214 100 %     Weight 02/02/24 0214 140 lb (63.5 kg)     Height 02/02/24 0214 5' 10 (1.778 m)     Head Circumference --      Peak Flow --      Pain Score 02/02/24 0216 8     Pain Loc --      Pain Education --      Exclude from Growth Chart --     Most recent vital signs: Vitals:   02/02/24 0315 02/02/24 0430  BP: 125/72 119/78  Pulse: 67 73  Resp: 13   Temp:    SpO2: 100% 99%    CONSTITUTIONAL: Alert, responds appropriately to questions. Well-appearing; well-nourished HEAD: Normocephalic, atraumatic EYES: Conjunctivae clear, pupils appear equal, sclera nonicteric ENT: normal nose; moist mucous membranes NECK: Supple, normal ROM, patient does have some cervical spine tenderness on exam without  step-off or deformity CARD: RRR; S1 and S2 appreciated RESP: Normal chest excursion without splinting or tachypnea; breath sounds clear and equal bilaterally; no wheezes, no rhonchi, no rales, no hypoxia or respiratory distress, speaking full sentences ABD/GI: Non-distended; soft, non-tender, no rebound, no guarding, no peritoneal signs RECTAL:  Normal rectal tone, no gross blood or melena, guaiac NEGATIVE, no hemorrhoids appreciated, nontender rectal exam, no fecal impaction. Chaperone present. BACK: The back appears normal, no other midline spinal tenderness or step-off or deformity EXT: Normal ROM in all joints; no deformity noted, no edema, no calf tenderness or calf swelling SKIN: Normal color for age and race; warm; no rash on exposed skin NEURO: Moves all extremities equally, normal speech, normal sensation diffusely, no facial asymmetry PSYCH: The patient's mood and manner are appropriate.   ED Results / Procedures / Treatments   LABS: (all labs ordered are listed, but only abnormal results are displayed) Labs Reviewed  CBC WITH DIFFERENTIAL/PLATELET - Abnormal; Notable for the following components:      Result Value   Hemoglobin 11.9 (*)    HCT 34.4 (*)    MCV 74.6 (*)    MCH 25.8 (*)    RDW 16.5 (*)    Lymphs Abs 0.6 (*)    All other components within normal limits  COMPREHENSIVE METABOLIC PANEL WITH GFR - Abnormal; Notable for the following components:   Sodium 133 (*)    Chloride 97 (*)    Glucose, Bld 140 (*)    Total Protein 8.2 (*)    AST 47 (*)    Total Bilirubin 1.9 (*)    All other components within normal limits  D-DIMER, QUANTITATIVE - Abnormal; Notable for the following components:   D-Dimer, Quant 0.60 (*)    All other components within normal limits  URINALYSIS, ROUTINE W REFLEX MICROSCOPIC - Abnormal; Notable for the following components:   Color, Urine YELLOW (*)    APPearance HAZY (*)    Ketones, ur 20 (*)    Protein, ur 30 (*)    All other  components within normal limits  URINE DRUG SCREEN, QUALITATIVE ([facility] ONLY) - Abnormal; Notable for the following components:   Cannabinoid 50 Ng, Ur [place] POSITIVE (*)    All other components within normal limits  CBG MONITORING, ED - Abnormal; Notable for the following components:   Glucose-Capillary 146 (*)    All other components within normal limits  MAGNESIUM  CK  ETHANOL  PROTIME-INR  HIV ANTIBODY (  ROUTINE TESTING W REFLEX)  TYPE AND SCREEN  TROPONIN I (HIGH SENSITIVITY)  TROPONIN I (HIGH SENSITIVITY)     EKG:  EKG Interpretation Date/Time:  Monday February 02 2024 02:16:38 EDT Ventricular Rate:  77 PR Interval:  156 QRS Duration:  88 QT Interval:  374 QTC Calculation: 423 R Axis:   86  Text Interpretation: Normal sinus rhythm with sinus arrhythmia Minimal voltage criteria for LVH, may be normal variant ( Sokolow-Lyon ) Borderline ECG When compared with ECG of 28-May-2023 20:29, QT has shortened Confirmed by Neomi Neptune [phone]) on 02/02/2024 2:33:27 AM         RADIOLOGY: My personal review and interpretation of imaging: CT scan shows left-sided PE.  No right heart strain.  CT head and cervical spine show no acute injury.  I have personally reviewed all radiology reports.   CT Angio Chest PE W and/or Wo Contrast Result Date: 02/02/2024 CLINICAL DATA:  Pulmonary embolism suspected. Low to intermediate probability. Patient feels like he was going to pass out after eating. EXAM: CT ANGIOGRAPHY CHEST WITH CONTRAST TECHNIQUE: Multidetector CT imaging of the chest was performed using the standard protocol during bolus administration of intravenous contrast. Multiplanar CT image reconstructions and MIPs were obtained to evaluate the vascular anatomy. RADIATION DOSE REDUCTION: This exam was performed according to the departmental dose-optimization program which includes automated exposure control, adjustment of the mA and/or kV according to patient size and/or use of iterative  reconstruction technique. CONTRAST:  75mL OMNIPAQUE  IOHEXOL  350 MG/ML SOLN COMPARISON:  04/11/2023 FINDINGS: Cardiovascular: There are filling defects identified within distal segmental and subsegmental pulmonary arteries to the posterior and medial left lower lobe, image 116/4. The RV to LV ratio is less than 1.0. Heart size is normal. No pericardial effusion. Age advanced atherosclerotic calcifications are noted. Mediastinum/Nodes: Anterior mediastinal mass measures 6.9 x 3.6 cm, image 65/4. On the previous exam this measured 7.1 x 3.8 cm. Nodal conglomeration within the AP window measures 2.7 x 2.2 cm, image 60/4. Previously 2.9 x 2.2 cm. Pre-vascular lymph node measures 0.8 cm, image 57/4. Previously 0.9 cm.Trachea, thyroid gland and esophagus demonstrate no significant findings Lungs/Pleura: The central airways are grossly patent. No pleural effusion or airspace consolidation. No pneumothorax identified. Upper Abdomen: No acute abnormality. Musculoskeletal: No chest wall abnormality. No acute or significant osseous findings. Review of the MIP images confirms the above findings. IMPRESSION: 1. Examination is positive for acute pulmonary embolism within distal segmental and subsegmental pulmonary arteries to the posterior and medial left lower lobe. 2. No signs of right heart strain. 3. Anterior mediastinal mass and mediastinal adenopathy is similar in appearance to previous exam. Findings are compatible with known lymphoma. 4.  Aortic Atherosclerosis (ICD10-I70.0). Electronically Signed   By: Waddell Calk M.D.   On: 02/02/2024 05:46   DG Chest Portable 1 View Result Date: 02/02/2024 CLINICAL DATA:  Syncope EXAM: PORTABLE CHEST 1 VIEW COMPARISON:  04/11/2023, 05/07/2023 FINDINGS: Right paramediastinal mass, as previously demonstrated. No focal airspace consolidation or pulmonary edema. Left approach central venous catheter tip is in the mid SVC. No pleural effusion or pneumothorax. IMPRESSION: 1. No acute  cardiopulmonary disease. 2. Right paramediastinal mass, as previously demonstrated. Electronically Signed   By: Franky Stanford M.D.   On: 02/02/2024 03:31   CT HEAD WO CONTRAST ( ) Result Date: 02/02/2024 CLINICAL DATA:  Recent syncopal episode with headaches and neck pain, initial encounter EXAM: CT HEAD WITHOUT CONTRAST CT CERVICAL SPINE WITHOUT CONTRAST TECHNIQUE: Multidetector CT imaging of the head and cervical  spine was performed following the standard protocol without intravenous contrast. Multiplanar CT image reconstructions of the cervical spine were also generated. RADIATION DOSE REDUCTION: This exam was performed according to the departmental dose-optimization program which includes automated exposure control, adjustment of the mA and/or kV according to patient size and/or use of iterative reconstruction technique. COMPARISON:  05/28/2023 FINDINGS: CT HEAD FINDINGS Brain: No evidence of acute infarction, hemorrhage, hydrocephalus, extra-axial collection or mass lesion/mass effect. Vascular: No hyperdense vessel or unexpected calcification. Skull: Normal. Negative for fracture or focal lesion. Sinuses/Orbits: Opacification of the left maxillary antrum is again seen stable from the prior exam. Other: None. CT CERVICAL SPINE FINDINGS Alignment: Mild straightening of the normal cervical lordosis is noted. Skull base and vertebrae: Interbody fusion is noted at C5-6 with anterior fixation. Mild osteophytic changes and facet hypertrophic changes are noted. No acute fracture or acute facet abnormality is noted. The odontoid is within normal limits. Soft tissues and spinal canal: Surrounding soft tissue structures are within normal limits. Upper chest: Visualized lung apices are unremarkable. Other: None IMPRESSION: CT of the head: No acute intracranial abnormality noted. Continued opacification of left maxillary antrum. CT of the cervical spine: Degenerative changes without acute abnormality. Electronically  Signed   By: Oneil Devonshire M.D.   On: 02/02/2024 03:11   CT Cervical Spine Wo Contrast Result Date: 02/02/2024 CLINICAL DATA:  Recent syncopal episode with headaches and neck pain, initial encounter EXAM: CT HEAD WITHOUT CONTRAST CT CERVICAL SPINE WITHOUT CONTRAST TECHNIQUE: Multidetector CT imaging of the head and cervical spine was performed following the standard protocol without intravenous contrast. Multiplanar CT image reconstructions of the cervical spine were also generated. RADIATION DOSE REDUCTION: This exam was performed according to the departmental dose-optimization program which includes automated exposure control, adjustment of the mA and/or kV according to patient size and/or use of iterative reconstruction technique. COMPARISON:  05/28/2023 FINDINGS: CT HEAD FINDINGS Brain: No evidence of acute infarction, hemorrhage, hydrocephalus, extra-axial collection or mass lesion/mass effect. Vascular: No hyperdense vessel or unexpected calcification. Skull: Normal. Negative for fracture or focal lesion. Sinuses/Orbits: Opacification of the left maxillary antrum is again seen stable from the prior exam. Other: None. CT CERVICAL SPINE FINDINGS Alignment: Mild straightening of the normal cervical lordosis is noted. Skull base and vertebrae: Interbody fusion is noted at C5-6 with anterior fixation. Mild osteophytic changes and facet hypertrophic changes are noted. No acute fracture or acute facet abnormality is noted. The odontoid is within normal limits. Soft tissues and spinal canal: Surrounding soft tissue structures are within normal limits. Upper chest: Visualized lung apices are unremarkable. Other: None IMPRESSION: CT of the head: No acute intracranial abnormality noted. Continued opacification of left maxillary antrum. CT of the cervical spine: Degenerative changes without acute abnormality. Electronically Signed   By: Oneil Devonshire M.D.   On: 02/02/2024 03:11     PROCEDURES:  Critical Care  performed: Yes, see critical care procedure note(s)   CRITICAL CARE Performed by: Josette Johnita Palleschi   Total critical care time: 30 minutes  Critical care time was exclusive of separately billable procedures and treating other patients.  Critical care was necessary to treat or prevent imminent or life-threatening deterioration.  Critical care was time spent personally by me on the following activities: development of treatment plan with patient and/or surrogate as well as nursing, discussions with consultants, evaluation of patient's response to treatment, examination of patient, obtaining history from patient or surrogate, ordering and performing treatments and interventions, ordering and review of laboratory studies, ordering  and review of radiographic studies, pulse oximetry and re-evaluation of patient's condition.   SABRA1-3 Lead EKG Interpretation  Performed by: Lillith Mcneff, Josette SAILOR, DO Authorized by: Darria Corvera, Josette SAILOR, DO     Interpretation: normal     ECG rate:  69   ECG rate assessment: normal     Rhythm: sinus rhythm     Ectopy: none     Conduction: normal       IMPRESSION / MDM / ASSESSMENT AND PLAN / ED COURSE  I reviewed the triage vital signs and the nursing notes.    Patient here after a syncopal event.  The patient is on the cardiac monitor to evaluate for evidence of arrhythmia and/or significant heart rate changes.   DIFFERENTIAL DIAGNOSIS (includes but not limited to):   Dehydration, orthostatic hypotension, vasovagal syncope, arrhythmia, ACS, PE, electrolyte derangement, cervical spine injury, GI bleed, iron  deficiency anemia   Patient's presentation is most consistent with acute presentation with potential threat to life or bodily function.   PLAN: Will obtain cardiac labs including D-dimer.  Will obtain CT of the head, cervical spine, chest x-ray.  Will give IV fluids, Toradol , Zofran  for symptomatic relief.   MEDICATIONS GIVEN IN ED: Medications  sodium  chloride 0.9 % bolus 1,000 mL (has no administration in time range)  heparin  bolus via infusion 3,800 Units (has no administration in time range)  heparin  ADULT infusion 100 units/mL (25000 units/250mL) (has no administration in time range)  sodium chloride  flush (NS) 0.9 % injection 3 mL (has no administration in time range)  HYDROcodone-acetaminophen  (NORCO/VICODIN) 5-325 MG per tablet 1-2 tablet (has no administration in time range)  morphine  (PF) 2 MG/ML injection 2 mg (has no administration in time range)  ondansetron  (ZOFRAN ) tablet 4 mg (has no administration in time range)    Or  ondansetron  (ZOFRAN ) injection 4 mg (has no administration in time range)  sodium chloride  0.9 % bolus 1,000 mL (0 mLs Intravenous Stopped 02/02/24 0540)  ketorolac  (TORADOL ) 30 MG/ML injection 30 mg (30 mg Intravenous Given 02/02/24 0318)  ondansetron  (ZOFRAN ) injection 4 mg (4 mg Intravenous Given 02/02/24 0318)  thiamine  (VITAMIN B1) injection 100 mg (100 mg Intravenous Given 02/02/24 0333)  iohexol  (OMNIPAQUE ) 350 MG/ML injection 75 mL (75 mLs Intravenous Contrast Given 02/02/24 0515)     ED COURSE: Patient's hemoglobin is 11.9.  Normal electrolytes.  Troponin x 2 negative.  D-dimer elevated.  Will obtain CTA of the chest.  CT head, cervical spine, chest x-ray reviewed and interpreted by myself and the radiologist and showed no acute abnormality.  He has right paramediastinal mass that has been seen previously on imaging.  Patient states he is still feeling lightheaded but improved.  Will give second liter of fluid.  Currently blood pressure is in the 100s systolic.   6:00 AM  Pt's CT scan reviewed and interpreted by myself and the radiologist.  Patient has a left-sided pulmonary embolus without right heart strain.  Given syncopal event along with this, will admit for IV heparin , echocardiogram, further cardiac monitoring.  Given his reports of bloody stool 5 to 6 days ago, I did perform a rectal exam with his  permission.  He has normal brown appearing stool that is guaiac negative.  Will discuss with hospitalist.   Alcohol level negative here.  He is not having any signs of alcohol withdrawal.  He is getting hydrated and IV thiamine .  CONSULTS:  Consulted and discussed patient's case with hospitalist, Dr. Cleatus.  I have recommended  admission and consulting physician agrees and will place admission orders.  Patient (and family if present) agree with this plan.   I reviewed all nursing notes, vitals, pertinent previous records.  All labs, EKGs, imaging ordered have been independently reviewed and interpreted by myself.    OUTSIDE RECORDS REVIEWED: Reviewed previous heme-onc notes.  Patient has received iron  transfusions for iron  deficiency anemia.       FINAL CLINICAL IMPRESSION(S) / ED DIAGNOSES   Final diagnoses:  Syncope and collapse  Other acute pulmonary embolism without acute cor pulmonale (HCC)     Rx / DC Orders   ED Discharge Orders     None        Note:  This document was prepared using Dragon voice recognition software and may include unintentional dictation errors.   Martyn Timme, Josette SAILOR, DO 02/02/24 [phone]

## 2024-02-02 NOTE — ED Notes (Signed)
 Pt given breakfast tray

## 2024-02-02 NOTE — Progress Notes (Signed)
 PHARMACY - ANTICOAGULATION CONSULT NOTE  Pharmacy Consult for Heparin   Indication: pulmonary embolus  No Known Allergies  Patient Measurements: Height: 5' 10 (177.8 cm) Weight: 63.5 kg (140 lb) IBW/kg (Calculated) : 73 HEPARIN  DW (KG): 63.5  Vital Signs: Temp: 98.4 F (36.9 C) (06/23 0946) Temp Source: Oral (06/23 0946) BP: 123/72 (06/23 0946) Pulse Rate: 62 (06/23 0946)  Labs: Recent Labs    02/02/24 0305 02/02/24 0421 02/02/24 1318  HGB 11.9*  --   --   HCT 34.4*  --   --   PLT 269  --   --   LABPROT 13.2  --   --   INR 1.0  --   --   HEPARINUNFRC  --   --  0.25*  CREATININE 0.80  --   --   CKTOTAL 143  --   --   TROPONINIHS 6 5  --     Estimated Creatinine Clearance: 111.3 mL/min (by C-G formula based on SCr of 0.8 mg/dL).   Medical History: Past Medical History:  Diagnosis Date   Alcohol abuse    Chronic low back pain    Hodgkin's lymphoma (HCC)    Stenosis of cervical spine    Left   Tobacco use     Medications:  Medications Prior to Admission  Medication Sig Dispense Refill Last Dose/Taking   acetaminophen  (TYLENOL ) 500 MG tablet Take 1,000 mg by mouth every 6 (six) hours as needed for mild pain or moderate pain.   Taking As Needed   gabapentin  (NEURONTIN ) 300 MG capsule Take 1 capsule by mouth twice daily for 7 days then increase to 3 times per day 90 capsule 2 Unknown    Assessment: Pharmacy consulted to dose heparin  in this 38 year old male admitted with PE.  No prior anticoag noted.   CrCl = 111.3 ml/min   6/23 1318 HL 0.25,  subtherapeutic  Goal of Therapy:  Heparin  level 0.3-0.7 units/ml Monitor platelets by anticoagulation protocol: Yes   Plan:  6/23 1318 HL 0.25,  subtherapeutic Will order 1000 units bolus x 1 Increase heparin  infusion to 1250 units/hr Check anti-Xa level in 6 hours and daily while on heparin  Continue to monitor H&H and platelets  Allean Haas PharmD Clinical Pharmacist 02/02/2024

## 2024-02-02 NOTE — Consult Note (Signed)
 Hematology/Oncology Consult note [phone] [phone]      Patient Care Team: Patient, No Pcp Per as PCP - General (General Practice) Babara Call, MD as Consulting Physician (Oncology)   Name of the patient: Preston [name]  [id number]  09/03/1985   REASON FOR COSULTATION:  Abnormal CT scan, history of lymphoma, pulmonary embolism History of presenting illness-  38 y.o. male with PMH listed at below who presents to ER for evaluation of syncope episode. Patient reports that he was sitting on the couch after eating.  He got suddenly lightheaded, dizzy and hot and sweaty.  Occasionally shortness of breath with exertion for about 1 to 2 months.  He has noticed more night sweats. He went to the emergency room for further evaluation. CT head without contrast showed no acute intracranial abnormality noted. CT cervical spine without contrast showed no acute abnormality. CT chest angiogram with and without contrast showed acute pulmonary embolism within the distal segmental and subsegmental pulmonary arteries to the posterior and medial left lower lobe.  No signs of right heart strain.  Anterior mediastinum mass and mediastinal adenopathy is similar in appearance to previous examination.  Aortic atherosclerosis  Patient denies any recent immobilization events.  Patient smokes every day. Oncology was consulted for further evaluation.  No Known Allergies  Patient Active Problem List   Diagnosis Date Noted   Malignant lymphoma, Hodgkin's type (HCC) 12/30/2019    Priority: High   Iron  deficiency anemia 12/05/2022    Priority: Medium    Blood in stool 12/05/2022    Priority: Medium    Back pain 07/15/2022    Priority: Medium    Port-A-Cath in place 07/15/2022    Priority: Medium    Cervical stenosis of spinal canal 10/18/2020    Priority: Medium    Transaminitis 07/15/2022    Priority: Low   Acute pulmonary embolism (HCC) 02/02/2024   Generalized abdominal  pain 12/29/2022   Elevated liver enzymes 12/29/2022   Inadequate pain control 12/28/2022   Elevated lactic acid level 12/28/2022   Alcohol use 12/28/2022   Intussusception intestine (HCC) 12/28/2022   Spinal stenosis of cervical region, severe 10/18/2020   Elevated blood alcohol level 10/18/2020   Limb weakness 10/18/2020   Excoriation of multiple sites 10/18/2020   Other chest pain 09/15/2020   Pain in joint of left shoulder 09/15/2020   SOB (shortness of breath) 09/15/2020   Encounter for antineoplastic chemotherapy 07/10/2020   Tobacco use 05/17/2020   Goals of care, counseling/discussion 05/17/2020     Past Medical History:  Diagnosis Date   Alcohol abuse    Chronic low back pain    Hodgkin's lymphoma (HCC)    Stenosis of cervical spine    Left   Tobacco use      Past Surgical History:  Procedure Laterality Date   ANTERIOR CERVICAL DECOMP/DISCECTOMY FUSION N/A 10/20/2020   Procedure: ANTERIOR CERVICAL DECOMPRESSION/DISCECTOMY FUSION 1 LEVEL C5-C6;  Surgeon: Clois Fret, MD;  Location: [facility] ORS;  Service: Neurosurgery;  Laterality: N/A;    Social History   Socioeconomic History   Marital status: Married    Spouse name: Not on file   Number of children: Not on file   Years of education: Not on file   Highest education level: Not on file  Occupational History   Not on file  Tobacco Use   Smoking status: Every Day    Current packs/day: 0.50    Average packs/day: 0.5 packs/day for 20.0 years (10.0 ttl pk-yrs)  Types: Cigarettes   Smokeless tobacco: Never  Vaping Use   Vaping status: Never Used  Substance and Sexual Activity   Alcohol use: Yes    Alcohol/week: 3.0 standard drinks of alcohol    Types: 3 Cans of beer per week    Comment: daily   Drug use: Not Currently   Sexual activity: Yes    Partners: Female  Other Topics Concern   Not on file  Social History Narrative   Not on file   Social Drivers of Health   Financial Resource Strain:  Unknown (03/29/2020)   Received from [facility]   Overall Financial Resource Strain (CARDIA)    Difficulty of Paying Living Expenses: Patient declined  Food Insecurity: No Food Insecurity (02/02/2024)   Hunger Vital Sign    Worried About Running Out of Food in the Last Year: Never true    Ran Out of Food in the Last Year: Never true  Transportation Needs: Unmet Transportation Needs (02/02/2024)   PRAPARE - Administrator, Civil Service (Medical): Yes    Lack of Transportation (Non-Medical): Yes  Physical Activity: Not on file  Stress: Not on file  Social Connections: Unknown (10/08/2022)   Received from [facility]   Social Network    Social Network: Not on file  Intimate Partner Violence: Not At Risk (02/02/2024)   Humiliation, Afraid, Rape, and Kick questionnaire    Fear of Current or Ex-Partner: No    Emotionally Abused: No    Physically Abused: No    Sexually Abused: No     Family History  Problem Relation Age of Onset   Heart failure Mother    Pneumonia Father    Alcohol abuse Brother      Current Facility-Administered Medications:    acetaminophen  (TYLENOL ) tablet 650 mg, 650 mg, Oral, Q6H PRN, Barbarann Nest, MD   feeding supplement (ENSURE PLUS HIGH PROTEIN) liquid 237 mL, 237 mL, Oral, BID BM, Cleatus Delayne GAILS, MD   heparin  ADULT infusion 100 units/mL (25000 units/250mL), 1,100 Units/hr, Intravenous, Continuous, Barbarann Nest, MD, Last Rate: 11 mL/hr at 02/02/24 0628, 1,100 Units/hr at 02/02/24 9371   ketorolac  (TORADOL ) 30 MG/ML injection 30 mg, 30 mg, Intravenous, Q6H PRN, Barbarann Nest, MD   lactated ringers  infusion, , Intravenous, Continuous, Barbarann Nest, MD, Last Rate: 100 mL/hr at 02/02/24 1024, New Bag at 02/02/24 1024   morphine  (PF) 2 MG/ML injection 2 mg, 2 mg, Intravenous, Q2H PRN, Barbarann Nest, MD, 2 mg at 02/02/24 1021   nicotine  (NICODERM CQ  - dosed in mg/24 hours) patch 14 mg, 14 mg, Transdermal, Daily, Barbarann Nest, MD, 14 mg at 02/02/24 1021   ondansetron  (ZOFRAN ) tablet 4 mg, 4 mg, Oral, Q6H PRN **OR** ondansetron  (ZOFRAN ) injection 4 mg, 4 mg, Intravenous, Q6H PRN, Cleatus Delayne GAILS, MD   sodium chloride  flush (NS) 0.9 % injection 3 mL, 3 mL, Intravenous, Q12H, Cleatus Delayne V, MD, 3 mL at 02/02/24 1022  Facility-Administered Medications Ordered in Other Encounters:    heparin  lock flush 100 unit/mL, 500 Units, Intravenous, Once, Babara Call, MD   sodium chloride  flush (NS) 0.9 % injection 10 mL, 10 mL, Intravenous, PRN, Babara Call, MD, 10 mL at 08/07/20 9173  Review of Systems  Constitutional:  Positive for fatigue and unexpected weight change. Negative for appetite change, chills and fever.  HENT:   Negative for hearing loss and voice change.   Eyes:  Negative for eye problems and icterus.  Respiratory:  Positive for  shortness of breath. Negative for chest tightness and cough.   Cardiovascular:  Negative for chest pain and leg swelling.  Gastrointestinal:  Negative for abdominal distention and abdominal pain.  Endocrine: Negative for hot flashes.       Night sweats  Genitourinary:  Negative for difficulty urinating, dysuria and frequency.   Musculoskeletal:  Negative for arthralgias.  Skin:  Negative for itching and rash.  Neurological:  Positive for light-headedness. Negative for numbness.  Hematological:  Negative for adenopathy. Does not bruise/bleed easily.  Psychiatric/Behavioral:  Negative for confusion.     PHYSICAL EXAM Vitals:   02/02/24 0745 02/02/24 0800 02/02/24 0830 02/02/24 0946  BP: 128/84 (!) 134/93 115/68 123/72  Pulse: 65 82 68 62  Resp:    19  Temp:    98.4 F (36.9 C)  TempSrc:    Oral  SpO2: 100% 100% 100% 100%  Weight:      Height:       Physical Exam Constitutional:      General: He is not in acute distress.    Appearance: He is not diaphoretic.  HENT:     Head: Normocephalic and atraumatic.     Mouth/Throat:     Pharynx: No oropharyngeal exudate.    Eyes:     General: No scleral icterus.    Pupils: Pupils are equal, round, and reactive to light.    Cardiovascular:     Rate and Rhythm: Normal rate and regular rhythm.     Heart sounds: No murmur heard. Pulmonary:     Effort: Pulmonary effort is normal. No respiratory distress.  Abdominal:     General: There is no distension.     Palpations: Abdomen is soft.     Tenderness: There is no abdominal tenderness.   Musculoskeletal:        General: Normal range of motion.     Cervical back: Normal range of motion and neck supple.   Skin:    General: Skin is warm and dry.     Findings: No erythema.   Neurological:     Mental Status: He is alert and oriented to person, place, and time. Mental status is at baseline.     Cranial Nerves: No cranial nerve deficit.     Motor: No weakness or abnormal muscle tone.     Coordination: Coordination normal.   Psychiatric:        Mood and Affect: Affect normal.       LABORATORY STUDIES    Latest Ref Rng & Units 02/02/2024    3:05 AM 05/28/2023    8:33 PM 05/02/2023   11:34 AM  CBC  WBC 4.0 - 10.5 K/uL 7.6  6.5  5.0   Hemoglobin 13.0 - 17.0 g/dL 88.0  84.2  86.9   Hematocrit 39.0 - 52.0 % 34.4  43.9  36.3   Platelets 150 - 400 K/uL 269  312  228       Latest Ref Rng & Units 02/02/2024    3:05 AM 05/28/2023    8:33 PM 04/11/2023   11:22 AM  CMP  Glucose 70 - 99 mg/dL 859  72  895   BUN 6 - 20 mg/dL 8  9  6    Creatinine 0.61 - 1.24 mg/dL 9.19  9.26  9.24   Sodium 135 - 145 mmol/L 133  134  134   Potassium 3.5 - 5.1 mmol/L 3.5  5.1  3.8   Chloride 98 - 111 mmol/L 97  95  102  CO2 22 - 32 mmol/L 28  22  24    Calcium 8.9 - 10.3 mg/dL 9.5  9.3  9.1   Total Protein 6.5 - 8.1 g/dL 8.2   7.2   Total Bilirubin 0.0 - 1.2 mg/dL 1.9   0.9   Alkaline Phos 38 - 126 U/L 42   40   AST 15 - 41 U/L 47   19   ALT 0 - 44 U/L 37   17      RADIOGRAPHIC STUDIES: I have personally reviewed the radiological images as listed and agreed with  the findings in the report. CT Angio Chest PE W and/or Wo Contrast Result Date: 02/02/2024 CLINICAL DATA:  Pulmonary embolism suspected. Low to intermediate probability. Patient feels like he was going to pass out after eating. EXAM: CT ANGIOGRAPHY CHEST WITH CONTRAST TECHNIQUE: Multidetector CT imaging of the chest was performed using the standard protocol during bolus administration of intravenous contrast. Multiplanar CT image reconstructions and MIPs were obtained to evaluate the vascular anatomy. RADIATION DOSE REDUCTION: This exam was performed according to the departmental dose-optimization program which includes automated exposure control, adjustment of the mA and/or kV according to patient size and/or use of iterative reconstruction technique. CONTRAST:  75mL OMNIPAQUE  IOHEXOL  350 MG/ML SOLN COMPARISON:  04/11/2023 FINDINGS: Cardiovascular: There are filling defects identified within distal segmental and subsegmental pulmonary arteries to the posterior and medial left lower lobe, image 116/4. The RV to LV ratio is less than 1.0. Heart size is normal. No pericardial effusion. Age advanced atherosclerotic calcifications are noted. Mediastinum/Nodes: Anterior mediastinal mass measures 6.9 x 3.6 cm, image 65/4. On the previous exam this measured 7.1 x 3.8 cm. Nodal conglomeration within the AP window measures 2.7 x 2.2 cm, image 60/4. Previously 2.9 x 2.2 cm. Pre-vascular lymph node measures 0.8 cm, image 57/4. Previously 0.9 cm.Trachea, thyroid gland and esophagus demonstrate no significant findings Lungs/Pleura: The central airways are grossly patent. No pleural effusion or airspace consolidation. No pneumothorax identified. Upper Abdomen: No acute abnormality. Musculoskeletal: No chest wall abnormality. No acute or significant osseous findings. Review of the MIP images confirms the above findings. IMPRESSION: 1. Examination is positive for acute pulmonary embolism within distal segmental and subsegmental  pulmonary arteries to the posterior and medial left lower lobe. 2. No signs of right heart strain. 3. Anterior mediastinal mass and mediastinal adenopathy is similar in appearance to previous exam. Findings are compatible with known lymphoma. 4.  Aortic Atherosclerosis (ICD10-I70.0). Electronically Signed   By: Waddell Calk M.D.   On: 02/02/2024 05:46   DG Chest Portable 1 View Result Date: 02/02/2024 CLINICAL DATA:  Syncope EXAM: PORTABLE CHEST 1 VIEW COMPARISON:  04/11/2023, 05/07/2023 FINDINGS: Right paramediastinal mass, as previously demonstrated. No focal airspace consolidation or pulmonary edema. Left approach central venous catheter tip is in the mid SVC. No pleural effusion or pneumothorax. IMPRESSION: 1. No acute cardiopulmonary disease. 2. Right paramediastinal mass, as previously demonstrated. Electronically Signed   By: Franky Stanford M.D.   On: 02/02/2024 03:31   CT HEAD WO CONTRAST ( ) Result Date: 02/02/2024 CLINICAL DATA:  Recent syncopal episode with headaches and neck pain, initial encounter EXAM: CT HEAD WITHOUT CONTRAST CT CERVICAL SPINE WITHOUT CONTRAST TECHNIQUE: Multidetector CT imaging of the head and cervical spine was performed following the standard protocol without intravenous contrast. Multiplanar CT image reconstructions of the cervical spine were also generated. RADIATION DOSE REDUCTION: This exam was performed according to the departmental dose-optimization program which includes automated exposure control, adjustment of the  mA and/or kV according to patient size and/or use of iterative reconstruction technique. COMPARISON:  05/28/2023 FINDINGS: CT HEAD FINDINGS Brain: No evidence of acute infarction, hemorrhage, hydrocephalus, extra-axial collection or mass lesion/mass effect. Vascular: No hyperdense vessel or unexpected calcification. Skull: Normal. Negative for fracture or focal lesion. Sinuses/Orbits: Opacification of the left maxillary antrum is again seen stable from  the prior exam. Other: None. CT CERVICAL SPINE FINDINGS Alignment: Mild straightening of the normal cervical lordosis is noted. Skull base and vertebrae: Interbody fusion is noted at C5-6 with anterior fixation. Mild osteophytic changes and facet hypertrophic changes are noted. No acute fracture or acute facet abnormality is noted. The odontoid is within normal limits. Soft tissues and spinal canal: Surrounding soft tissue structures are within normal limits. Upper chest: Visualized lung apices are unremarkable. Other: None IMPRESSION: CT of the head: No acute intracranial abnormality noted. Continued opacification of left maxillary antrum. CT of the cervical spine: Degenerative changes without acute abnormality. Electronically Signed   By: Oneil Devonshire M.D.   On: 02/02/2024 03:11   CT Cervical Spine Wo Contrast Result Date: 02/02/2024 CLINICAL DATA:  Recent syncopal episode with headaches and neck pain, initial encounter EXAM: CT HEAD WITHOUT CONTRAST CT CERVICAL SPINE WITHOUT CONTRAST TECHNIQUE: Multidetector CT imaging of the head and cervical spine was performed following the standard protocol without intravenous contrast. Multiplanar CT image reconstructions of the cervical spine were also generated. RADIATION DOSE REDUCTION: This exam was performed according to the departmental dose-optimization program which includes automated exposure control, adjustment of the mA and/or kV according to patient size and/or use of iterative reconstruction technique. COMPARISON:  05/28/2023 FINDINGS: CT HEAD FINDINGS Brain: No evidence of acute infarction, hemorrhage, hydrocephalus, extra-axial collection or mass lesion/mass effect. Vascular: No hyperdense vessel or unexpected calcification. Skull: Normal. Negative for fracture or focal lesion. Sinuses/Orbits: Opacification of the left maxillary antrum is again seen stable from the prior exam. Other: None. CT CERVICAL SPINE FINDINGS Alignment: Mild straightening of the  normal cervical lordosis is noted. Skull base and vertebrae: Interbody fusion is noted at C5-6 with anterior fixation. Mild osteophytic changes and facet hypertrophic changes are noted. No acute fracture or acute facet abnormality is noted. The odontoid is within normal limits. Soft tissues and spinal canal: Surrounding soft tissue structures are within normal limits. Upper chest: Visualized lung apices are unremarkable. Other: None IMPRESSION: CT of the head: No acute intracranial abnormality noted. Continued opacification of left maxillary antrum. CT of the cervical spine: Degenerative changes without acute abnormality. Electronically Signed   By: Oneil Devonshire M.D.   On: 02/02/2024 03:11     Assessment and plan-   Acute pulmonary embolism, unprovoked. Reviewed anticoagulation, switch to DOACs if his insurance covers. Follow-up outpatient  Hodgkin's lymphoma 05/07/2023, PET scan showed stable treated mediastinal nodal mass with no hypermetabolic him to suggest active disease. Currently CT angiogram chest with and without contrast showed stable anterior mediastinal mass and mediastinal adenopathy. No significant change of size.   he does seems to have more constitutional symptoms including weight loss and night sweats.   Check ESR Consider repeat PET scan of patient.  Syncope, likely secondary to dehydration, PE.   Thank you for allowing me to participate in the care of this patient.   [name], MD, PhD Hematology Oncology 02/02/2024

## 2024-02-02 NOTE — H&P (Addendum)
 History and Physical    Patient: Preston [name] FMW:[id number] DOB: 1985/11/27 DOA: 02/02/2024 DOS: the patient was seen and examined on 02/02/2024 PCP: Patient, No Pcp Per  Patient coming from: Home - lives with sister, nieces, nephew; NOK: [place], [place], [phone]   Chief Complaint: Syncope  HPI: Preston [name] is a 38 y.o. male with medical history significant of ETOH use d/o, chronic pain, and Hodgkin's lymphoma who presented on 6/23 with a syncopal episode.  He reports that he was eating and then sitting on the couch.  He got lightheaded, dizzy, got hot/sweaty.  He fell out and passed out.  LOC for maybe 1-2 minutes.  He vomited about 3 minutes and then felt a little better.  Occasional SOB with exertion for maybe 1-2 months.  Occasional BLE edema.  He has had more night sweats.  He was diagnosed with lymphoma 4 years ago, treated with chemo x 3 years.  He thinks he has been in remission, has been seeing then cancer doctor.  He sees Dr. Babara.  + weight loss, unintentional.    ER Course:  Carryover per EDP patient here with history of Hodgkin's lymphoma, iron  deficiency anemia. Had a syncopal event at home after eating dinner while sitting in a chair. Continued to feel lightheaded here. CT chest shows left PE, no right heart strain. Troponin x 2 negative. Getting heparin .      Review of Systems: As mentioned in the history of present illness. All other systems reviewed and are negative. Past Medical History:  Diagnosis Date   Alcohol abuse    Chronic low back pain    Hodgkin's lymphoma (HCC)    Stenosis of cervical spine    Left   Tobacco use    Past Surgical History:  Procedure Laterality Date   ANTERIOR CERVICAL DECOMP/DISCECTOMY FUSION N/A 10/20/2020   Procedure: ANTERIOR CERVICAL DECOMPRESSION/DISCECTOMY FUSION 1 LEVEL C5-C6;  Surgeon: Clois Fret, MD;  Location: [facility] ORS;  Service: Neurosurgery;  Laterality: N/A;   Social History:  reports that  he has been smoking cigarettes. He has a 10 pack-year smoking history. He has never used smokeless tobacco. He reports current alcohol use of about 3.0 standard drinks of alcohol per week. He reports that he does not currently use drugs.  No Known Allergies  Family History  Problem Relation Age of Onset   Heart failure Mother    Pneumonia Father    Alcohol abuse Brother     Prior to Admission medications   Medication Sig Start Date End Date Taking? Authorizing Provider  acetaminophen  (TYLENOL ) 500 MG tablet Take 1,000 mg by mouth every 6 (six) hours as needed for mild pain or moderate pain.    [provider]  gabapentin  (NEURONTIN ) 300 MG capsule Take 1 capsule by mouth twice daily for 7 days then increase to 3 times per day 03/07/23   Tonette Lauraine HERO, PA-C  ibuprofen (ADVIL) 200 MG tablet Take 600-800 mg by mouth every 6 (six) hours as needed for mild pain or moderate pain.    [provider]  iron  polysaccharides (NIFEREX) 150 MG capsule Take 1 capsule (150 mg total) by mouth 2 (two) times daily. 12/06/22   Babara Call, MD  lidocaine  (LIDODERM ) 5 % Place 1 patch onto the skin every 12 (twelve) hours. Remove & Discard patch within 12 hours or as directed by MD 05/29/23 05/28/24  Claudene Rover, MD  traMADol  (ULTRAM ) 50 MG tablet Take 1 tablet (50 mg total) by mouth every 8 (eight)  hours as needed. 05/02/23   Babara Call, MD    Physical Exam: Vitals:   02/02/24 0629 02/02/24 0745 02/02/24 0800 02/02/24 0830  BP:  128/84 (!) 134/93 115/68  Pulse:  65 82 68  Resp:      Temp: 98 F (36.7 C)     TempSrc: Oral     SpO2:  100% 100% 100%  Weight:      Height:       General:  Appears calm and comfortable and is in NAD, on RA Eyes:  PERRL, EOMI, normal lids, iris ENT:  grossly normal hearing, lips & tongue, mmm; appropriate dentition Neck:  no LAD, masses or thyromegaly Cardiovascular:  RRR. No LE edema.  Respiratory:   CTA bilaterally with no wheezes/rales/rhonchi.  Normal  respiratory effort. Abdomen:  soft, NT, ND Skin:  no rash or induration seen on limited exam Musculoskeletal:  grossly normal tone BUE/BLE, good ROM, no bony abnormality Psychiatric:  grossly normal mood and affect, speech fluent and appropriate, AOx3 Neurologic:  CN 2-12 grossly intact, moves all extremities in coordinated fashion   Radiological Exams on Admission: Independently reviewed - see discussion in A/P where applicable  CT Angio Chest PE W and/or Wo Contrast Result Date: 02/02/2024 CLINICAL DATA:  Pulmonary embolism suspected. Low to intermediate probability. Patient feels like he was going to pass out after eating. EXAM: CT ANGIOGRAPHY CHEST WITH CONTRAST TECHNIQUE: Multidetector CT imaging of the chest was performed using the standard protocol during bolus administration of intravenous contrast. Multiplanar CT image reconstructions and MIPs were obtained to evaluate the vascular anatomy. RADIATION DOSE REDUCTION: This exam was performed according to the departmental dose-optimization program which includes automated exposure control, adjustment of the mA and/or kV according to patient size and/or use of iterative reconstruction technique. CONTRAST:  75mL OMNIPAQUE  IOHEXOL  350 MG/ML SOLN COMPARISON:  04/11/2023 FINDINGS: Cardiovascular: There are filling defects identified within distal segmental and subsegmental pulmonary arteries to the posterior and medial left lower lobe, image 116/4. The RV to LV ratio is less than 1.0. Heart size is normal. No pericardial effusion. Age advanced atherosclerotic calcifications are noted. Mediastinum/Nodes: Anterior mediastinal mass measures 6.9 x 3.6 cm, image 65/4. On the previous exam this measured 7.1 x 3.8 cm. Nodal conglomeration within the AP window measures 2.7 x 2.2 cm, image 60/4. Previously 2.9 x 2.2 cm. Pre-vascular lymph node measures 0.8 cm, image 57/4. Previously 0.9 cm.Trachea, thyroid gland and esophagus demonstrate no significant findings  Lungs/Pleura: The central airways are grossly patent. No pleural effusion or airspace consolidation. No pneumothorax identified. Upper Abdomen: No acute abnormality. Musculoskeletal: No chest wall abnormality. No acute or significant osseous findings. Review of the MIP images confirms the above findings. IMPRESSION: 1. Examination is positive for acute pulmonary embolism within distal segmental and subsegmental pulmonary arteries to the posterior and medial left lower lobe. 2. No signs of right heart strain. 3. Anterior mediastinal mass and mediastinal adenopathy is similar in appearance to previous exam. Findings are compatible with known lymphoma. 4.  Aortic Atherosclerosis (ICD10-I70.0). Electronically Signed   By: Waddell Calk M.D.   On: 02/02/2024 05:46   DG Chest Portable 1 View Result Date: 02/02/2024 CLINICAL DATA:  Syncope EXAM: PORTABLE CHEST 1 VIEW COMPARISON:  04/11/2023, 05/07/2023 FINDINGS: Right paramediastinal mass, as previously demonstrated. No focal airspace consolidation or pulmonary edema. Left approach central venous catheter tip is in the mid SVC. No pleural effusion or pneumothorax. IMPRESSION: 1. No acute cardiopulmonary disease. 2. Right paramediastinal mass, as previously demonstrated. Electronically  Signed   By: Franky Stanford M.D.   On: 02/02/2024 03:31   CT HEAD WO CONTRAST ( ) Result Date: 02/02/2024 CLINICAL DATA:  Recent syncopal episode with headaches and neck pain, initial encounter EXAM: CT HEAD WITHOUT CONTRAST CT CERVICAL SPINE WITHOUT CONTRAST TECHNIQUE: Multidetector CT imaging of the head and cervical spine was performed following the standard protocol without intravenous contrast. Multiplanar CT image reconstructions of the cervical spine were also generated. RADIATION DOSE REDUCTION: This exam was performed according to the departmental dose-optimization program which includes automated exposure control, adjustment of the mA and/or kV according to patient size  and/or use of iterative reconstruction technique. COMPARISON:  05/28/2023 FINDINGS: CT HEAD FINDINGS Brain: No evidence of acute infarction, hemorrhage, hydrocephalus, extra-axial collection or mass lesion/mass effect. Vascular: No hyperdense vessel or unexpected calcification. Skull: Normal. Negative for fracture or focal lesion. Sinuses/Orbits: Opacification of the left maxillary antrum is again seen stable from the prior exam. Other: None. CT CERVICAL SPINE FINDINGS Alignment: Mild straightening of the normal cervical lordosis is noted. Skull base and vertebrae: Interbody fusion is noted at C5-6 with anterior fixation. Mild osteophytic changes and facet hypertrophic changes are noted. No acute fracture or acute facet abnormality is noted. The odontoid is within normal limits. Soft tissues and spinal canal: Surrounding soft tissue structures are within normal limits. Upper chest: Visualized lung apices are unremarkable. Other: None IMPRESSION: CT of the head: No acute intracranial abnormality noted. Continued opacification of left maxillary antrum. CT of the cervical spine: Degenerative changes without acute abnormality. Electronically Signed   By: Oneil Devonshire M.D.   On: 02/02/2024 03:11   CT Cervical Spine Wo Contrast Result Date: 02/02/2024 CLINICAL DATA:  Recent syncopal episode with headaches and neck pain, initial encounter EXAM: CT HEAD WITHOUT CONTRAST CT CERVICAL SPINE WITHOUT CONTRAST TECHNIQUE: Multidetector CT imaging of the head and cervical spine was performed following the standard protocol without intravenous contrast. Multiplanar CT image reconstructions of the cervical spine were also generated. RADIATION DOSE REDUCTION: This exam was performed according to the departmental dose-optimization program which includes automated exposure control, adjustment of the mA and/or kV according to patient size and/or use of iterative reconstruction technique. COMPARISON:  05/28/2023 FINDINGS: CT HEAD  FINDINGS Brain: No evidence of acute infarction, hemorrhage, hydrocephalus, extra-axial collection or mass lesion/mass effect. Vascular: No hyperdense vessel or unexpected calcification. Skull: Normal. Negative for fracture or focal lesion. Sinuses/Orbits: Opacification of the left maxillary antrum is again seen stable from the prior exam. Other: None. CT CERVICAL SPINE FINDINGS Alignment: Mild straightening of the normal cervical lordosis is noted. Skull base and vertebrae: Interbody fusion is noted at C5-6 with anterior fixation. Mild osteophytic changes and facet hypertrophic changes are noted. No acute fracture or acute facet abnormality is noted. The odontoid is within normal limits. Soft tissues and spinal canal: Surrounding soft tissue structures are within normal limits. Upper chest: Visualized lung apices are unremarkable. Other: None IMPRESSION: CT of the head: No acute intracranial abnormality noted. Continued opacification of left maxillary antrum. CT of the cervical spine: Degenerative changes without acute abnormality. Electronically Signed   By: Oneil Devonshire M.D.   On: 02/02/2024 03:11    EKG: Independently reviewed.   0216 - NSR with rate 77; no evidence of acute ischemia 0314 - NSR with rate 76; LVH; no evidence of acute ischemia   Labs on Admission: I have personally reviewed the available labs and imaging studies at the time of the admission.  Pertinent labs:    Glucose  140 HS troponin 6, 5  WBC 7.6 Hgb 11.9 UA: 20 ketones, 30 protein UDS + THC   Assessment and Plan: Principal Problem:   Acute pulmonary embolism (HCC)    Acute PE Patient without prior episodes of thromboembolic disease presenting with new PE Patient is not showing evidence of hemodynamic instability at this time PESI score is Class II, low risk, indicating a 79% 30-day mortality risk S-PESI score is high, indicating that the patient has an 8.9% risk of death and a 1.5% risk of recurrent VTE or  non-fatal bleeding Will observe on telemetry  R heart strain was not seen on CT Troponin negative Will order echo to assess the severity of clot burden  Initiate anticoagulation - for now, will start treatment-dose heparin  with plan to transition to alternative [facility] agent tomorrow [place] O2 as needed (none currently We discussed oral AC treatment options and risks/benefits including frequent MD visits and dietary regulation with Coumadin vs. Significant expense with DOAC therapy Will request pharmacy team consultation to assist with cost analysis of the various DOAC options based on his insurance ($4 for either Eliquis or Xarelto) Smoking cessation has been strongly encouraged Patients with active cancer are at high risk (>8%/year) for recurrent VTE and should be maintained on indefinite AC. The patient understands that thromboembolic disease can be catastrophic and even deadly and that he must be complaint with physician appointments and anticoagulation. Defer decision about DVT US  to oncology, as it appears to be unlikely to change current management  Hodgkin's lymphoma Last saw Dr. Babara in 04/2023 There was concern for disease progression at that time and he was recommended for PET restaging, possible biopsy He appears to have been lost to f/u He now reports recurrent symptoms Imaging shows LAD suggestive of lymphoma Dr. Babara has been consulted and will see the patient today  Syncope Dehydration with ketonuria plus PE and malignancy likely led to syncopal event IVF Overnight monitoring  Hyperglycemia No reported h/o DM Will check A1c Monitor with labs for now  Marijuana use Cessation encouraged  Tobacco dependence Cessation is crucial, counseling provided Nicotine  patch ordered     Advance Care Planning:   Code Status: Full Code   Consults: Oncology, pharmacy  DVT Prophylaxis: Heparin  drip  Family Communication: None present  Severity of Illness: The appropriate patient  status for this patient is OBSERVATION. Observation status is judged to be reasonable and necessary in order to provide the required intensity of service to ensure the patient's safety. The patient's presenting symptoms, physical exam findings, and initial radiographic and laboratory data in the context of their medical condition is felt to place them at decreased risk for further clinical deterioration. Furthermore, it is anticipated that the patient will be medically stable for discharge from the hospital within 2 midnights of admission.   Author: Delon Herald, MD 02/02/2024 9:33 AM  For on call review [email or web address].

## 2024-02-02 NOTE — Progress Notes (Signed)
 PHARMACY - ANTICOAGULATION CONSULT NOTE  Pharmacy Consult for Heparin   Indication: pulmonary embolus  No Known Allergies  Patient Measurements: Height: 5' 10 (177.8 cm) Weight: 63.5 kg (140 lb) IBW/kg (Calculated) : 73 HEPARIN  DW (KG): 63.5  Vital Signs: Temp: 98 F (36.7 C) (06/23 0214) Temp Source: Oral (06/23 0214) BP: 119/78 (06/23 0430) Pulse Rate: 73 (06/23 0430)  Labs: Recent Labs    02/02/24 0305 02/02/24 0421  HGB 11.9*  --   HCT 34.4*  --   PLT 269  --   LABPROT 13.2  --   INR 1.0  --   CREATININE 0.80  --   CKTOTAL 143  --   TROPONINIHS 6 5    Estimated Creatinine Clearance: 111.3 mL/min (by C-G formula based on SCr of 0.8 mg/dL).   Medical History: Past Medical History:  Diagnosis Date   Alcohol abuse    Chronic low back pain    Hodgkin's lymphoma (HCC)    Stenosis of cervical spine    Left   Tobacco use     Medications:  (Not in a hospital admission)   Assessment: Pharmacy consulted to dose heparin  in this 38 year old male admitted with PE.  No prior anticoag noted.   CrCl = 111.3 ml/min   Goal of Therapy:  Heparin  level 0.3-0.7 units/ml Monitor platelets by anticoagulation protocol: Yes   Plan:  Give 3800 units bolus x 1 Start heparin  infusion at 1100 units/hr Check anti-Xa level in 6 hours and daily while on heparin  Continue to monitor H&H and platelets  Nathanie Ottley D 02/02/2024,5:59 AM

## 2024-02-02 NOTE — Telephone Encounter (Signed)
Patient Product/process development scientist completed.    The patient is insured through [facility] MEDICAID.     Ran test claim for Eliquis 5 mg and the current 30 day co-pay is $4.00.  Ran test claim for Xarelto 20 mg and the current 30 day co-pay is $4.00.  This test claim was processed through [facility]- copay amounts may vary at other pharmacies due to pharmacy/plan contracts, or as the patient moves through the different stages of their insurance plan.     Roland Earl, CPHT Pharmacy Technician III Certified Patient Advocate [facility] Patient Advocate Team Direct Number: [phone]  Fax: [phone]

## 2024-02-02 NOTE — ED Triage Notes (Signed)
 First RN Note: Pt to ED via [facility], per EMS pt had just finished eating when he felt like he was going to pass out, got pale. Per EMS pt had an episode of vomiting, per SO on scene pt had a syncopal episode, was assisted to the floor, did not hit his head. Per EMS pt has been eating/drinking normally. Per EMS pt c/o head/nack pain that is chronic in nature. Pt to lobby A&O x4 in wheelchair.    115/78 97% RA 70HR 99CBG

## 2024-02-02 NOTE — Progress Notes (Signed)
 PHARMACY - ANTICOAGULATION CONSULT NOTE  Pharmacy Consult for Heparin   Indication: pulmonary embolus  No Known Allergies  Patient Measurements: Height: 5' 10 (177.8 cm) Weight: 63.5 kg (140 lb) IBW/kg (Calculated) : 73 HEPARIN  DW (KG): 63.5  Vital Signs: Temp: 97.8 F (36.6 C) (06/23 2024) Temp Source: Oral (06/23 1506) BP: 132/86 (06/23 2024) Pulse Rate: 66 (06/23 2024)  Labs: Recent Labs    02/02/24 0305 02/02/24 0421 02/02/24 1318 02/02/24 2003  HGB 11.9*  --   --   --   HCT 34.4*  --   --   --   PLT 269  --   --   --   LABPROT 13.2  --   --   --   INR 1.0  --   --   --   HEPARINUNFRC  --   --  0.25* 0.24*  CREATININE 0.80  --   --   --   CKTOTAL 143  --   --   --   TROPONINIHS 6 5  --   --     Estimated Creatinine Clearance: 111.3 mL/min (by C-G formula based on SCr of 0.8 mg/dL).   Medical History: Past Medical History:  Diagnosis Date   Alcohol abuse    Chronic low back pain    Hodgkin's lymphoma (HCC)    Stenosis of cervical spine    Left   Tobacco use     Medications:  Medications Prior to Admission  Medication Sig Dispense Refill Last Dose/Taking   acetaminophen  (TYLENOL ) 500 MG tablet Take 1,000 mg by mouth every 6 (six) hours as needed for mild pain or moderate pain.   Taking As Needed   gabapentin  (NEURONTIN ) 300 MG capsule Take 1 capsule by mouth twice daily for 7 days then increase to 3 times per day 90 capsule 2 Unknown    Assessment: Pharmacy consulted to dose heparin  in this 38 year old male admitted with PE.  No prior anticoag noted.   CrCl = 111.3 ml/min   6/23 1318 HL 0.25,  subtherapeutic  Goal of Therapy:  Heparin  level 0.3-0.7 units/ml Monitor platelets by anticoagulation protocol: Yes  6/23 1318 HL 0.25,  subtherapeutic 6/23 2003 HL 0.24, subtherapeutic  Plan:  Will order heparin  1000 units bolus x 1 Increase heparin  infusion to 1350 units/hr Check anti-Xa level in 6 hours after rate change Continue to monitor H&H  and platelets  Kayla JULIANNA Blew ,PharmD Clinical Pharmacist 02/02/2024

## 2024-02-03 ENCOUNTER — Other Ambulatory Visit: Payer: Self-pay

## 2024-02-03 DIAGNOSIS — I2699 Other pulmonary embolism without acute cor pulmonale: Secondary | ICD-10-CM | POA: Diagnosis not present

## 2024-02-03 LAB — CBC
HCT: 29.5 % — ABNORMAL LOW (ref 39.0–52.0)
Hemoglobin: 10.1 g/dL — ABNORMAL LOW (ref 13.0–17.0)
MCH: 26 pg (ref 26.0–34.0)
MCHC: 34.2 g/dL (ref 30.0–36.0)
MCV: 75.8 fL — ABNORMAL LOW (ref 80.0–100.0)
Platelets: 215 10*3/uL (ref 150–400)
RBC: 3.89 MIL/uL — ABNORMAL LOW (ref 4.22–5.81)
RDW: 16.3 % — ABNORMAL HIGH (ref 11.5–15.5)
WBC: 5.6 10*3/uL (ref 4.0–10.5)
nRBC: 0 % (ref 0.0–0.2)

## 2024-02-03 LAB — HEPARIN LEVEL (UNFRACTIONATED)
Heparin Unfractionated: 0.31 [IU]/mL (ref 0.30–0.70)
Heparin Unfractionated: 0.45 [IU]/mL (ref 0.30–0.70)

## 2024-02-03 LAB — SEDIMENTATION RATE: Sed Rate: 3 mm/h (ref 0–15)

## 2024-02-03 MED ORDER — NICOTINE POLACRILEX 2 MG MT GUM: 2.0000 mg | CHEWING_GUM | OROMUCOSAL | Status: DC | PRN

## 2024-02-03 MED ORDER — ELIQUIS 5 MG PO TABS
10.0000 mg | ORAL_TABLET | Freq: Two times a day (BID) | ORAL | 0 refills | Status: DC
  Filled 2024-02-03: qty 74, 30d supply, fill #0
  Filled 2024-02-03: qty 28, 7d supply, fill #0

## 2024-02-03 MED ORDER — APIXABAN 5 MG PO TABS
5.0000 mg | ORAL_TABLET | Freq: Two times a day (BID) | ORAL | 6 refills | Status: DC
  Filled 2024-02-03: qty 60, 30d supply, fill #0

## 2024-02-03 MED ORDER — TRAMADOL HCL 50 MG PO TABS
50.0000 mg | ORAL_TABLET | Freq: Four times a day (QID) | ORAL | Status: DC | PRN
  Administered 2024-02-03: 50 mg via ORAL
  Filled 2024-02-03: qty 1

## 2024-02-03 MED ORDER — APIXABAN 5 MG PO TABS
10.0000 mg | ORAL_TABLET | Freq: Once | ORAL | Status: AC
  Administered 2024-02-03: 10 mg via ORAL
  Filled 2024-02-03: qty 2

## 2024-02-03 MED ORDER — TRAMADOL HCL 50 MG PO TABS
50.0000 mg | ORAL_TABLET | Freq: Four times a day (QID) | ORAL | 0 refills | Status: AC | PRN
  Filled 2024-02-03: qty 9, 3d supply, fill #0

## 2024-02-03 NOTE — Progress Notes (Signed)
 PHARMACY - ANTICOAGULATION CONSULT NOTE  Pharmacy Consult for Heparin   Indication: pulmonary embolus  No Known Allergies  Patient Measurements: Height: 5' 10 (177.8 cm) Weight: 63.5 kg (140 lb) IBW/kg (Calculated) : 73 HEPARIN  DW (KG): 63.5  Vital Signs: Temp: 98 F (36.7 C) (06/24 0050) Temp Source: Oral (06/24 0050) BP: 125/75 (06/24 0050) Pulse Rate: 64 (06/24 0050)  Labs: Recent Labs    02/02/24 0305 02/02/24 0421 02/02/24 1318 02/02/24 2003 02/03/24 0333  HGB 11.9*  --   --   --  10.1*  HCT 34.4*  --   --   --  29.5*  PLT 269  --   --   --  215  LABPROT 13.2  --   --   --   --   INR 1.0  --   --   --   --   HEPARINUNFRC  --   --  0.25* 0.24* 0.31  CREATININE 0.80  --   --   --   --   CKTOTAL 143  --   --   --   --   TROPONINIHS 6 5  --   --   --     Estimated Creatinine Clearance: 111.3 mL/min (by C-G formula based on SCr of 0.8 mg/dL).   Medical History: Past Medical History:  Diagnosis Date   Alcohol abuse    Chronic low back pain    Hodgkin's lymphoma (HCC)    Stenosis of cervical spine    Left   Tobacco use     Medications:  Medications Prior to Admission  Medication Sig Dispense Refill Last Dose/Taking   acetaminophen  (TYLENOL ) 500 MG tablet Take 1,000 mg by mouth every 6 (six) hours as needed for mild pain or moderate pain.   Taking As Needed   gabapentin  (NEURONTIN ) 300 MG capsule Take 1 capsule by mouth twice daily for 7 days then increase to 3 times per day 90 capsule 2 Unknown    Assessment: Pharmacy consulted to dose heparin  in this 38 year old male admitted with PE.  No prior anticoag noted.   CrCl = 111.3 ml/min   6/23 1318 HL 0.25,  subtherapeutic  Goal of Therapy:  Heparin  level 0.3-0.7 units/ml Monitor platelets by anticoagulation protocol: Yes  6/23 1318 HL 0.25,  subtherapeutic 6/23 2003 HL 0.24, subtherapeutic 6/24 0333 HL 0.31, therapeutic x 1  Plan:  Continue heparin  infusion to 1350 units/hr Recheck HL in 6 hours  after rate change Continue to monitor H&H and platelets  Rankin CANDIE Dills, PharmD, [facility] 02/03/2024 4:15 AM

## 2024-02-03 NOTE — Progress Notes (Signed)
 IV's removed discharge instructions reviewed and meds delivered to room.  Patient to be discharged to home with girlfriend

## 2024-02-03 NOTE — Plan of Care (Signed)
   Problem: Education: Goal: Knowledge of General Education information will improve Description: Including pain rating scale, medication(s)/side effects and non-pharmacologic comfort measures Outcome: Progressing   Problem: Clinical Measurements: Goal: Ability to maintain clinical measurements within normal limits will improve Outcome: Progressing   Problem: Nutrition: Goal: Adequate nutrition will be maintained Outcome: Progressing

## 2024-02-03 NOTE — Discharge Instructions (Signed)
 Some PCP options in [place] area- not a comprehensive list  [facility]- [phone] [facility]- [phone] [facility]- [phone] [facility]- [phone] [facility]- [phone] [facility]- [phone]  or [facility] Physician Referral Line [phone]

## 2024-02-03 NOTE — Discharge Summary (Signed)
 Physician Discharge Summary   Patient: Preston [name] MRN: [id number] DOB: Sep 08, 1985  Admit date:     02/02/2024  Discharge date: 02/03/24  Discharge Physician: Drue ONEIDA Potter   PCP: Patient, No Pcp Per   Recommendations at discharge:  Approved hematology oncology  Discharge Diagnoses:  Acute PE Hodgkin's lymphoma Syncope Hyperglycemia Marijuana use Tobacco dependence  Hospital Course: Preston [name] is a 38 y.o. male with medical history significant of ETOH use d/o, chronic pain, and Hodgkin's lymphoma who presented on 6/23 with a syncopal episode.  He reports that he was eating and then sitting on the couch.  He got lightheaded, dizzy, got hot/sweaty.  He fell out and passed out.  LOC for maybe 1-2 minutes.  Patient was seen by oncology has not been recommended for outpatient follow-up.  Echocardiogram did not show right heart strain.  Patient currently not requiring oxygen not being cleared for discharge on oral anticoagulant and will follow-up with hematology oncologist.   Consultants: Heme-onc Procedures performed: None Disposition Home Diet recommendation:  Cardiac diet DISCHARGE MEDICATION: Allergies as of 02/03/2024   No Known Allergies      Medication List     TAKE these medications    acetaminophen  500 MG tablet Commonly known as: TYLENOL  Take 1,000 mg by mouth every 6 (six) hours as needed for mild pain or moderate pain.   Eliquis 5 MG Tabs tablet Generic drug: apixaban Take 2 tablets (10 mg total) by mouth 2 (two) times daily for 7 days, then take 1 tablet (5 mg total) by mouth 2 (two) times daily   apixaban 5 MG Tabs tablet Commonly known as: ELIQUIS Take 1 tablet (5 mg total) by mouth 2 (two) times daily. Start taking on: February 11, 2024   gabapentin  300 MG capsule Commonly known as: Neurontin  Take 1 capsule by mouth twice daily for 7 days then increase to 3 times per day   traMADol  50 MG tablet Commonly known as: ULTRAM  Take 1 tablet  (50 mg total) by mouth every 6 (six) hours as needed for up to 3 days for moderate pain (pain score 4-6).        Discharge Exam: Filed Weights   02/02/24 0214  Weight: 63.5 kg   General:  Appears calm and comfortable and is in NAD, on RA Eyes:  PERRL, EOMI, normal lids, iris ENT:  grossly normal hearing, lips & tongue, mmm; appropriate dentition Neck:  no LAD, masses or thyromegaly Cardiovascular:  RRR. No LE edema.  Respiratory:   CTA bilaterally with no wheezes/rales/rhonchi.  Normal respiratory effort. Abdomen:  soft, NT, ND Skin:  no rash or induration seen on limited exam Musculoskeletal:  grossly normal tone BUE/BLE, good ROM, no bony abnormality Psychiatric:  grossly normal mood and affect, speech fluent and appropriate, AOx3 Neurologic:  CN 2-12 grossly intact, moves all extremities in coordinated fashion  Condition at discharge: good  The results of significant diagnostics from this hospitalization (including imaging, microbiology, ancillary and laboratory) are listed below for reference.   Imaging Studies: ECHOCARDIOGRAM COMPLETE Result Date: 02/02/2024    ECHOCARDIOGRAM REPORT   Patient Name:   Preston [name] Date of Exam: 02/02/2024 Medical Rec #:  [id number]           Height:       70.0 in Accession #:    [id number]          Weight:       140.0 lb Date of Birth:  1986-05-20  BSA:          1.794 m Patient Age:    37 years            BP:           123/72 mmHg Patient Gender: M                   HR:           76 bpm. Exam Location:  [facility] Procedure: 2D Echo, Cardiac Doppler, Color Doppler and Strain Analysis (Both            Spectral and Color Flow Doppler were utilized during procedure). Indications:     Syncope R55  History:         Patient has prior history of Echocardiogram examinations.                  History of pulmonary embolism, history of lymphoma; Risk                  Factors:Current Smoker.  Sonographer:     Delon Dade Referring Phys:  [id number] DELAYNE LULLA SOLIAN Diagnosing Phys: Deatrice Cage MD  Sonographer Comments: Global longitudinal strain was attempted. IMPRESSIONS  1. Left ventricular ejection fraction, by estimation, is 60 to 65%. The left ventricle has normal function. The left ventricle has no regional wall motion abnormalities. Left ventricular diastolic parameters were normal. The average left ventricular global longitudinal strain is -24.0 %. The global longitudinal strain is normal.  2. Right ventricular systolic function is normal. The right ventricular size is normal. Tricuspid regurgitation signal is inadequate for assessing PA pressure.  3. The mitral valve is normal in structure. No evidence of mitral valve regurgitation. No evidence of mitral stenosis.  4. The aortic valve is normal in structure. Aortic valve regurgitation is not visualized. No aortic stenosis is present.  5. The inferior vena cava is normal in size with greater than 50% respiratory variability, suggesting right atrial pressure of 3 mmHg. FINDINGS  Left Ventricle: Left ventricular ejection fraction, by estimation, is 60 to 65%. The left ventricle has normal function. The left ventricle has no regional wall motion abnormalities. The average left ventricular global longitudinal strain is -24.0 %. Strain was performed and the global longitudinal strain is normal. The left ventricular internal cavity size was normal in size. There is no left ventricular hypertrophy. Left ventricular diastolic parameters were normal. Right Ventricle: The right ventricular size is normal. No increase in right ventricular wall thickness. Right ventricular systolic function is normal. Tricuspid regurgitation signal is inadequate for assessing PA pressure. Left Atrium: Left atrial size was normal in size. Right Atrium: Right atrial size was normal in size. Pericardium: There is no evidence of pericardial effusion. Mitral Valve: The mitral valve is normal in structure. No evidence of mitral valve  regurgitation. No evidence of mitral valve stenosis. MV peak gradient, 8.5 mmHg. The mean mitral valve gradient is 3.0 mmHg. Tricuspid Valve: The tricuspid valve is normal in structure. Tricuspid valve regurgitation is not demonstrated. No evidence of tricuspid stenosis. Aortic Valve: The aortic valve is normal in structure. Aortic valve regurgitation is not visualized. No aortic stenosis is present. Aortic valve mean gradient measures 5.0 mmHg. Aortic valve peak gradient measures 11.0 mmHg. Aortic valve area, by VTI measures 2.77 cm. Pulmonic Valve: The pulmonic valve was normal in structure. Pulmonic valve regurgitation is trivial. No evidence of pulmonic stenosis. Aorta: The aortic root is normal in size and structure. Venous: The  inferior vena cava is normal in size with greater than 50% respiratory variability, suggesting right atrial pressure of 3 mmHg. IAS/Shunts: No atrial level shunt detected by color flow Doppler.  LEFT VENTRICLE PLAX 2D LVIDd:         4.90 cm   Diastology LVIDs:         2.80 cm   LV e' medial:    14.80 cm/s LV PW:         1.00 cm   LV E/e' medial:  6.8 LV IVS:        0.80 cm   LV e' lateral:   12.90 cm/s LVOT diam:     2.00 cm   LV E/e' lateral: 7.8 LV SV:         74 LV SV Index:   41        2D Longitudinal Strain LVOT Area:     3.14 cm  2D Strain GLS Avg:     -24.0 %  RIGHT VENTRICLE RV Basal diam:  2.80 cm RV S prime:     15.70 cm/s TAPSE (M-mode): 2.2 cm LEFT ATRIUM             Index        RIGHT ATRIUM           Index LA diam:        2.60 cm 1.45 cm/m   RA Area:     13.30 cm LA Vol (A2C):   36.7 ml 20.46 ml/m  RA Volume:   30.70 ml  17.12 ml/m LA Vol (A4C):   17.7 ml 9.87 ml/m LA Biplane Vol: 26.2 ml 14.61 ml/m  AORTIC VALVE                     PULMONIC VALVE AV Area (Vmax):    2.57 cm      PV Vmax:        0.76 m/s AV Area (Vmean):   2.69 cm      PV Peak grad:   2.3 mmHg AV Area (VTI):     2.77 cm      RVOT Peak grad: 2 mmHg AV Vmax:           166.00 cm/s AV Vmean:           104.000 cm/s AV VTI:            0.268 m AV Peak Grad:      11.0 mmHg AV Mean Grad:      5.0 mmHg LVOT Vmax:         136.00 cm/s LVOT Vmean:        89.100 cm/s LVOT VTI:          0.236 m LVOT/AV VTI ratio: 0.88  AORTA Ao Root diam: 3.10 cm Ao Asc diam:  2.70 cm MITRAL VALVE MV Area (PHT): 3.61 cm     SHUNTS MV Area VTI:   1.68 cm     Systemic VTI:  0.24 m MV Peak grad:  8.5 mmHg     Systemic Diam: 2.00 cm MV Mean grad:  3.0 mmHg MV Vmax:       1.46 m/s MV Vmean:      72.3 cm/s MV Decel Time: 210 msec MV E velocity: 101.00 cm/s MV A velocity: 97.90 cm/s MV E/A ratio:  1.03 Deatrice Cage MD Electronically signed by Deatrice Cage MD Signature Date/Time: 02/02/2024/3:51:04 PM    Final    CT Angio Chest PE W and/or  Wo Contrast Result Date: 02/02/2024 CLINICAL DATA:  Pulmonary embolism suspected. Low to intermediate probability. Patient feels like he was going to pass out after eating. EXAM: CT ANGIOGRAPHY CHEST WITH CONTRAST TECHNIQUE: Multidetector CT imaging of the chest was performed using the standard protocol during bolus administration of intravenous contrast. Multiplanar CT image reconstructions and MIPs were obtained to evaluate the vascular anatomy. RADIATION DOSE REDUCTION: This exam was performed according to the departmental dose-optimization program which includes automated exposure control, adjustment of the mA and/or kV according to patient size and/or use of iterative reconstruction technique. CONTRAST:  75mL OMNIPAQUE  IOHEXOL  350 MG/ML SOLN COMPARISON:  04/11/2023 FINDINGS: Cardiovascular: There are filling defects identified within distal segmental and subsegmental pulmonary arteries to the posterior and medial left lower lobe, image 116/4. The RV to LV ratio is less than 1.0. Heart size is normal. No pericardial effusion. Age advanced atherosclerotic calcifications are noted. Mediastinum/Nodes: Anterior mediastinal mass measures 6.9 x 3.6 cm, image 65/4. On the previous exam this measured 7.1 x  3.8 cm. Nodal conglomeration within the AP window measures 2.7 x 2.2 cm, image 60/4. Previously 2.9 x 2.2 cm. Pre-vascular lymph node measures 0.8 cm, image 57/4. Previously 0.9 cm.Trachea, thyroid gland and esophagus demonstrate no significant findings Lungs/Pleura: The central airways are grossly patent. No pleural effusion or airspace consolidation. No pneumothorax identified. Upper Abdomen: No acute abnormality. Musculoskeletal: No chest wall abnormality. No acute or significant osseous findings. Review of the MIP images confirms the above findings. IMPRESSION: 1. Examination is positive for acute pulmonary embolism within distal segmental and subsegmental pulmonary arteries to the posterior and medial left lower lobe. 2. No signs of right heart strain. 3. Anterior mediastinal mass and mediastinal adenopathy is similar in appearance to previous exam. Findings are compatible with known lymphoma. 4.  Aortic Atherosclerosis (ICD10-I70.0). Electronically Signed   By: Waddell Calk M.D.   On: 02/02/2024 05:46   DG Chest Portable 1 View Result Date: 02/02/2024 CLINICAL DATA:  Syncope EXAM: PORTABLE CHEST 1 VIEW COMPARISON:  04/11/2023, 05/07/2023 FINDINGS: Right paramediastinal mass, as previously demonstrated. No focal airspace consolidation or pulmonary edema. Left approach central venous catheter tip is in the mid SVC. No pleural effusion or pneumothorax. IMPRESSION: 1. No acute cardiopulmonary disease. 2. Right paramediastinal mass, as previously demonstrated. Electronically Signed   By: Franky Stanford M.D.   On: 02/02/2024 03:31   CT HEAD WO CONTRAST ( ) Result Date: 02/02/2024 CLINICAL DATA:  Recent syncopal episode with headaches and neck pain, initial encounter EXAM: CT HEAD WITHOUT CONTRAST CT CERVICAL SPINE WITHOUT CONTRAST TECHNIQUE: Multidetector CT imaging of the head and cervical spine was performed following the standard protocol without intravenous contrast. Multiplanar CT image reconstructions  of the cervical spine were also generated. RADIATION DOSE REDUCTION: This exam was performed according to the departmental dose-optimization program which includes automated exposure control, adjustment of the mA and/or kV according to patient size and/or use of iterative reconstruction technique. COMPARISON:  05/28/2023 FINDINGS: CT HEAD FINDINGS Brain: No evidence of acute infarction, hemorrhage, hydrocephalus, extra-axial collection or mass lesion/mass effect. Vascular: No hyperdense vessel or unexpected calcification. Skull: Normal. Negative for fracture or focal lesion. Sinuses/Orbits: Opacification of the left maxillary antrum is again seen stable from the prior exam. Other: None. CT CERVICAL SPINE FINDINGS Alignment: Mild straightening of the normal cervical lordosis is noted. Skull base and vertebrae: Interbody fusion is noted at C5-6 with anterior fixation. Mild osteophytic changes and facet hypertrophic changes are noted. No acute fracture or acute facet abnormality is  noted. The odontoid is within normal limits. Soft tissues and spinal canal: Surrounding soft tissue structures are within normal limits. Upper chest: Visualized lung apices are unremarkable. Other: None IMPRESSION: CT of the head: No acute intracranial abnormality noted. Continued opacification of left maxillary antrum. CT of the cervical spine: Degenerative changes without acute abnormality. Electronically Signed   By: Oneil Devonshire M.D.   On: 02/02/2024 03:11   CT Cervical Spine Wo Contrast Result Date: 02/02/2024 CLINICAL DATA:  Recent syncopal episode with headaches and neck pain, initial encounter EXAM: CT HEAD WITHOUT CONTRAST CT CERVICAL SPINE WITHOUT CONTRAST TECHNIQUE: Multidetector CT imaging of the head and cervical spine was performed following the standard protocol without intravenous contrast. Multiplanar CT image reconstructions of the cervical spine were also generated. RADIATION DOSE REDUCTION: This exam was performed  according to the departmental dose-optimization program which includes automated exposure control, adjustment of the mA and/or kV according to patient size and/or use of iterative reconstruction technique. COMPARISON:  05/28/2023 FINDINGS: CT HEAD FINDINGS Brain: No evidence of acute infarction, hemorrhage, hydrocephalus, extra-axial collection or mass lesion/mass effect. Vascular: No hyperdense vessel or unexpected calcification. Skull: Normal. Negative for fracture or focal lesion. Sinuses/Orbits: Opacification of the left maxillary antrum is again seen stable from the prior exam. Other: None. CT CERVICAL SPINE FINDINGS Alignment: Mild straightening of the normal cervical lordosis is noted. Skull base and vertebrae: Interbody fusion is noted at C5-6 with anterior fixation. Mild osteophytic changes and facet hypertrophic changes are noted. No acute fracture or acute facet abnormality is noted. The odontoid is within normal limits. Soft tissues and spinal canal: Surrounding soft tissue structures are within normal limits. Upper chest: Visualized lung apices are unremarkable. Other: None IMPRESSION: CT of the head: No acute intracranial abnormality noted. Continued opacification of left maxillary antrum. CT of the cervical spine: Degenerative changes without acute abnormality. Electronically Signed   By: Oneil Devonshire M.D.   On: 02/02/2024 03:11    Microbiology: Results for orders placed or performed during the hospital encounter of 10/17/20  Resp Panel by RT-PCR (Flu A&B, Covid) Nasopharyngeal Swab     Status: None   Collection Time: 10/18/20 12:54 AM   Specimen: Nasopharyngeal Swab; Nasopharyngeal(NP) swabs in vial transport medium  Result Value Ref Range Status   SARS Coronavirus 2 by RT PCR NEGATIVE NEGATIVE Final    Comment: (NOTE) SARS-CoV-2 target nucleic acids are NOT DETECTED.  The SARS-CoV-2 RNA is generally detectable in upper respiratory specimens during the acute phase of infection. The  lowest concentration of SARS-CoV-2 viral copies this assay can detect is 138 copies/mL. A negative result does not preclude SARS-Cov-2 infection and should not be used as the sole basis for treatment or other patient management decisions. A negative result may occur with  improper specimen collection/handling, submission of specimen other than nasopharyngeal swab, presence of viral mutation(s) within the areas targeted by this assay, and inadequate number of viral copies(<138 copies/mL). A negative result must be combined with clinical observations, patient history, and epidemiological information. The expected result is Negative.  Fact Sheet for Patients:  BloggerCourse.com  Fact Sheet for Healthcare Providers:  SeriousBroker.it  This test is no t yet approved or cleared by the [place]  FDA and  has been authorized for detection and/or diagnosis of SARS-CoV-2 by FDA under an Emergency Use Authorization (EUA). This EUA will remain  in effect (meaning this test can be used) for the duration of the COVID-19 declaration under Section 564(b)(1) of the Act, 21 U.S.C.section 360bbb-3(b)(1),  unless the authorization is terminated  or revoked sooner.       Influenza A by PCR NEGATIVE NEGATIVE Final   Influenza B by PCR NEGATIVE NEGATIVE Final    Comment: (NOTE) The Xpert Xpress SARS-CoV-2/FLU/RSV plus assay is intended as an aid in the diagnosis of influenza from Nasopharyngeal swab specimens and should not be used as a sole basis for treatment. Nasal washings and aspirates are unacceptable for Xpert Xpress SARS-CoV-2/FLU/RSV testing.  Fact Sheet for Patients: BloggerCourse.com  Fact Sheet for Healthcare Providers: SeriousBroker.it  This test is not yet approved or cleared by the [place]  FDA and has been authorized for detection and/or diagnosis of SARS-CoV-2 by FDA under  an Emergency Use Authorization (EUA). This EUA will remain in effect (meaning this test can be used) for the duration of the COVID-19 declaration under Section 564(b)(1) of the Act, 21 U.S.C. section 360bbb-3(b)(1), unless the authorization is terminated or revoked.  Performed at [facility], [address] [place], [place] [address]     Labs: CBC: Recent Labs  Lab 02/02/24 0305 02/03/24 0333  WBC 7.6 5.6  NEUTROABS 6.3  --   HGB 11.9* 10.1*  HCT 34.4* 29.5*  MCV 74.6* 75.8*  PLT 269 215   Basic Metabolic Panel: Recent Labs  Lab 02/02/24 0305  NA 133*  K 3.5  CL 97*  CO2 28  GLUCOSE 140*  BUN 8  CREATININE 0.80  CALCIUM 9.5  MG 1.9   Liver Function Tests: Recent Labs  Lab 02/02/24 0305  AST 47*  ALT 37  ALKPHOS 42  BILITOT 1.9*  PROT 8.2*  ALBUMIN 4.7   CBG: Recent Labs  Lab 02/02/24 0332  GLUCAP 146*    Discharge time spent:  .  Signed: Drue ONEIDA Potter, MD [facility] 02/03/2024

## 2024-02-03 NOTE — TOC Progression Note (Signed)
 Transition of Care [facility]) - Progression Note    Patient Details  Name: Preston [name] MRN: [id number] Date of Birth: [birth year]  [facility] CM/SW Contact  Quintella Suzen Jansky, RN Phone Number: 02/03/2024, 2:38 PM  Clinical Narrative:    Patient to discharge today, home.      Barriers to Discharge: Barriers Resolved  Expected Discharge Plan and Services         Expected Discharge Date: 02/03/24                 DME Agency: NA       HH Arranged: NA           Social Determinants of Health (SDOH) Interventions SDOH Screenings   Food Insecurity: No Food Insecurity (02/02/2024)  Housing: Low Risk  (02/02/2024)  Transportation Needs: Unmet Transportation Needs (02/02/2024)  Utilities: Not At Risk (02/02/2024)  Financial Resource Strain: Unknown (03/29/2020)   Received from [facility]  Social Connections: Unknown (10/08/2022)   Received from [facility]  Tobacco Use: High Risk (02/02/2024)    Readmission Risk Interventions     No data to display

## 2024-02-03 NOTE — TOC Initial Note (Signed)
 Transition of Care [facility]) - Initial/Assessment Note    Patient Details  Name: Preston [name] MRN: [id number] Date of Birth: February 02, 1986  [facility] of [facility]) CM/SW Contact:    Quintella Suzen Jansky, RN Phone Number: 02/03/2024, 7:39 AM  Clinical Narrative:                  Patient lives alone, independent. He does not have PCP listed, added PCP list to AVS. No discharge needs identified by TOC at this time. If patient has TOC needs please place [facility] consult.       Patient Goals and CMS Choice            Expected Discharge Plan and Services                                              Prior Living Arrangements/Services                       Activities of Daily Living   ADL Screening (condition at time of admission) Independently performs ADLs?: Yes (appropriate for developmental age) Is the patient deaf or have difficulty hearing?: No Does the patient have difficulty seeing, even when wearing glasses/contacts?: Yes Does the patient have difficulty concentrating, remembering, or making decisions?: No  Permission Sought/Granted                  Emotional Assessment              Admission diagnosis:  Syncope and collapse [R55] Acute pulmonary embolism (HCC) [I26.99] Other acute pulmonary embolism without acute cor pulmonale (HCC) [I26.99] Patient Active Problem List   Diagnosis Date Noted   Acute pulmonary embolism (HCC) 02/02/2024   Syncope and collapse 02/02/2024   History of Hodgkin's lymphoma 02/02/2024   Generalized abdominal pain 12/29/2022   Elevated liver enzymes 12/29/2022   Inadequate pain control 12/28/2022   Elevated lactic acid level 12/28/2022   Alcohol use 12/28/2022   Intussusception intestine (HCC) 12/28/2022   Iron  deficiency anemia 12/05/2022   Blood in stool 12/05/2022   Back pain 07/15/2022   Port-A-Cath in place 07/15/2022   Transaminitis 07/15/2022   Spinal stenosis of cervical region, severe  10/18/2020   Elevated blood alcohol level 10/18/2020   Limb weakness 10/18/2020   Excoriation of multiple sites 10/18/2020   Cervical stenosis of spinal canal 10/18/2020   Other chest pain 09/15/2020   Pain in joint of left shoulder 09/15/2020   SOB (shortness of breath) 09/15/2020   Encounter for antineoplastic chemotherapy 07/10/2020   Tobacco use 05/17/2020   Goals of care, counseling/discussion 05/17/2020   Malignant lymphoma, Hodgkin's type (HCC) 12/30/2019   PCP:  Patient, No Pcp Per Pharmacy:   [facility] [address] [place], [place] - [address] [place] [address] Phone: [phone] Fax: [phone]  [facility] [address] [place] [address] Phone: [phone] Fax: [phone]  [facility] [address]), [place] - [address] [place]) [place] [address] Phone: [phone] Fax: [phone]     Social Drivers of Health (SDOH) Social History: SDOH Screenings   Food Insecurity: No Food Insecurity (02/02/2024)  Housing: Low Risk  (02/02/2024)  Transportation Needs: Unmet Transportation Needs (02/02/2024)  Utilities: Not At Risk (02/02/2024)  Financial Resource Strain: Unknown (03/29/2020)  [facility]  Social Connections: Unknown (10/08/2022)   Received from [facility]  Tobacco Use: High Risk (02/02/2024)   SDOH Interventions:     Readmission Risk Interventions     No data to display

## 2024-02-03 NOTE — Progress Notes (Signed)
 PHARMACY - ANTICOAGULATION CONSULT NOTE  Pharmacy Consult for Heparin   Indication: pulmonary embolus  No Known Allergies  Patient Measurements: Height: 5' 10 (177.8 cm) Weight: 63.5 kg (140 lb) IBW/kg (Calculated) : 73 HEPARIN  DW (KG): 63.5  Vital Signs: Temp: 98 F (36.7 C) (06/24 0716) Temp Source: Oral (06/24 0050) BP: 128/95 (06/24 0716) Pulse Rate: 59 (06/24 0716)  Labs: Recent Labs    02/02/24 0305 02/02/24 0421 02/02/24 1318 02/02/24 2003 02/03/24 0333 02/03/24 1002  HGB 11.9*  --   --   --  10.1*  --   HCT 34.4*  --   --   --  29.5*  --   PLT 269  --   --   --  215  --   LABPROT 13.2  --   --   --   --   --   INR 1.0  --   --   --   --   --   HEPARINUNFRC  --   --    < > 0.24* 0.31 0.45  CREATININE 0.80  --   --   --   --   --   CKTOTAL 143  --   --   --   --   --   TROPONINIHS 6 5  --   --   --   --    < > = values in this interval not displayed.    Estimated Creatinine Clearance: 111.3 mL/min (by C-G formula based on SCr of 0.8 mg/dL).   Medical History: Past Medical History:  Diagnosis Date   Alcohol abuse    Chronic low back pain    Hodgkin's lymphoma (HCC)    Stenosis of cervical spine    Left   Tobacco use     Medications:  Medications Prior to Admission  Medication Sig Dispense Refill Last Dose/Taking   acetaminophen  (TYLENOL ) 500 MG tablet Take 1,000 mg by mouth every 6 (six) hours as needed for mild pain or moderate pain.   Taking As Needed   gabapentin  (NEURONTIN ) 300 MG capsule Take 1 capsule by mouth twice daily for 7 days then increase to 3 times per day 90 capsule 2 Unknown    Assessment: Pharmacy consulted to dose heparin  in this 38 year old male admitted with PE.  No prior anticoag noted.   CrCl = 111.3 ml/min   Goal of Therapy:  Heparin  level 0.3-0.7 units/ml Monitor platelets by anticoagulation protocol: Yes  6/23 1318 HL 0.25,  subtherapeutic 6/23 2003 HL 0.24, subtherapeutic 6/24 0333 HL 0.31, therapeutic x 1 6/24  1002 HL 0.45, therapeutic x 2  Plan:  6/24 1002 HL 0.45, therapeutic x 2 Continue heparin  infusion at 1350 units/hr check HL with am labs Continue to monitor H&H and platelets  Allean Haas PharmD Clinical Pharmacist 02/03/2024

## 2024-02-10 ENCOUNTER — Inpatient Hospital Stay: Admitting: Oncology

## 2024-02-10 ENCOUNTER — Inpatient Hospital Stay

## 2024-02-19 ENCOUNTER — Other Ambulatory Visit: Payer: Self-pay

## 2024-02-19 ENCOUNTER — Encounter: Payer: Self-pay | Admitting: Oncology

## 2024-02-19 ENCOUNTER — Inpatient Hospital Stay (HOSPITAL_BASED_OUTPATIENT_CLINIC_OR_DEPARTMENT_OTHER): Admitting: Oncology

## 2024-02-19 ENCOUNTER — Ambulatory Visit: Payer: Self-pay | Admitting: Oncology

## 2024-02-19 ENCOUNTER — Inpatient Hospital Stay: Attending: Oncology

## 2024-02-19 VITALS — BP 142/87 | HR 75 | Temp 97.2°F | Resp 18 | Wt 149.1 lb

## 2024-02-19 DIAGNOSIS — D500 Iron deficiency anemia secondary to blood loss (chronic): Secondary | ICD-10-CM | POA: Diagnosis not present

## 2024-02-19 DIAGNOSIS — K625 Hemorrhage of anus and rectum: Secondary | ICD-10-CM

## 2024-02-19 DIAGNOSIS — D509 Iron deficiency anemia, unspecified: Secondary | ICD-10-CM | POA: Insufficient documentation

## 2024-02-19 DIAGNOSIS — I2699 Other pulmonary embolism without acute cor pulmonale: Secondary | ICD-10-CM | POA: Insufficient documentation

## 2024-02-19 DIAGNOSIS — G893 Neoplasm related pain (acute) (chronic): Secondary | ICD-10-CM | POA: Insufficient documentation

## 2024-02-19 DIAGNOSIS — C8170 Other classical Hodgkin lymphoma, unspecified site: Secondary | ICD-10-CM

## 2024-02-19 DIAGNOSIS — K921 Melena: Secondary | ICD-10-CM | POA: Diagnosis not present

## 2024-02-19 DIAGNOSIS — Z7901 Long term (current) use of anticoagulants: Secondary | ICD-10-CM | POA: Insufficient documentation

## 2024-02-19 DIAGNOSIS — M4802 Spinal stenosis, cervical region: Secondary | ICD-10-CM | POA: Insufficient documentation

## 2024-02-19 DIAGNOSIS — C8192 Hodgkin lymphoma, unspecified, intrathoracic lymph nodes: Secondary | ICD-10-CM | POA: Diagnosis present

## 2024-02-19 DIAGNOSIS — Z95828 Presence of other vascular implants and grafts: Secondary | ICD-10-CM

## 2024-02-19 LAB — LACTATE DEHYDROGENASE: LDH: 89 U/L — ABNORMAL LOW (ref 98–192)

## 2024-02-19 LAB — FERRITIN: Ferritin: 17 ng/mL — ABNORMAL LOW (ref 24–336)

## 2024-02-19 LAB — RETIC PANEL
Immature Retic Fract: 22.2 % — ABNORMAL HIGH (ref 2.3–15.9)
RBC.: 3.89 MIL/uL — ABNORMAL LOW (ref 4.22–5.81)
Retic Count, Absolute: 47.8 K/uL (ref 19.0–186.0)
Retic Ct Pct: 1.2 % (ref 0.4–3.1)
Reticulocyte Hemoglobin: 31.8 pg (ref >27.9)

## 2024-02-19 LAB — CMP (CANCER CENTER ONLY)
ALT: 13 U/L (ref 0–44)
AST: 16 U/L (ref 15–41)
Albumin: 4.1 g/dL (ref 3.5–5.0)
Alkaline Phosphatase: 40 U/L (ref 38–126)
Anion gap: 6 (ref 5–15)
BUN: 13 mg/dL (ref 6–20)
CO2: 24 mmol/L (ref 22–32)
Calcium: 8.8 mg/dL — ABNORMAL LOW (ref 8.9–10.3)
Chloride: 105 mmol/L (ref 98–111)
Creatinine: 0.69 mg/dL (ref 0.61–1.24)
GFR, Estimated: >60 mL/min (ref >60)
Glucose, Bld: 103 mg/dL — ABNORMAL HIGH (ref 70–99)
Potassium: 3.7 mmol/L (ref 3.5–5.1)
Sodium: 135 mmol/L (ref 135–145)
Total Bilirubin: 0.6 mg/dL (ref 0.0–1.2)
Total Protein: 7.1 g/dL (ref 6.5–8.1)

## 2024-02-19 LAB — CBC WITH DIFFERENTIAL (CANCER CENTER ONLY)
Abs Immature Granulocytes: 0.02 K/uL (ref 0.00–0.07)
Basophils Absolute: 0 K/uL (ref 0.0–0.1)
Basophils Relative: 0 %
Eosinophils Absolute: 0 K/uL (ref 0.0–0.5)
Eosinophils Relative: 1 %
HCT: 28.9 % — ABNORMAL LOW (ref 39.0–52.0)
Hemoglobin: 10.2 g/dL — ABNORMAL LOW (ref 13.0–17.0)
Immature Granulocytes: 0 %
Lymphocytes Relative: 16 %
Lymphs Abs: 1 K/uL (ref 0.7–4.0)
MCH: 26.2 pg (ref 26.0–34.0)
MCHC: 35.3 g/dL (ref 30.0–36.0)
MCV: 74.1 fL — ABNORMAL LOW (ref 80.0–100.0)
Monocytes Absolute: 0.6 K/uL (ref 0.1–1.0)
Monocytes Relative: 11 %
Neutro Abs: 4.3 K/uL (ref 1.7–7.7)
Neutrophils Relative %: 72 %
Platelet Count: 259 K/uL (ref 150–400)
RBC: 3.9 MIL/uL — ABNORMAL LOW (ref 4.22–5.81)
RDW: 17.3 % — ABNORMAL HIGH (ref 11.5–15.5)
WBC Count: 6 K/uL (ref 4.0–10.5)
nRBC: 0 % (ref 0.0–0.2)

## 2024-02-19 LAB — IRON AND TIBC
Iron: 20 ug/dL — ABNORMAL LOW (ref 45–182)
Saturation Ratios: 5 % — ABNORMAL LOW (ref 17.9–39.5)
TIBC: 406 ug/dL (ref 250–450)
UIBC: 386 ug/dL

## 2024-02-19 LAB — SEDIMENTATION RATE: Sed Rate: 3 mm/h (ref 0–15)

## 2024-02-19 MED ORDER — OXYCODONE HCL 5 MG PO TABS: 5.0000 mg | ORAL_TABLET | Freq: Three times a day (TID) | ORAL | 0 refills | Status: DC | PRN

## 2024-02-19 MED ORDER — SODIUM CHLORIDE 0.9% FLUSH
10.0000 mL | INTRAVENOUS | Status: DC | PRN
  Administered 2024-02-19: 10 mL via INTRAVENOUS
  Filled 2024-02-19: qty 10

## 2024-02-19 MED ORDER — HEPARIN SOD (PORK) LOCK FLUSH 100 UNIT/ML IV SOLN
500.0000 [IU] | Freq: Once | INTRAVENOUS | Status: AC
  Administered 2024-02-19: 500 [IU] via INTRAVENOUS
  Filled 2024-02-19: qty 5

## 2024-02-19 NOTE — Assessment & Plan Note (Signed)
left antecubital fossa port, he has not flushed as instructed.  I previous discussed with vascular surgeon Dr.Dew.  Plan is to remove this port as it is associated with high risk of occlusion/thrombosis. However patient did not follow up with vascular surgery.  Continue port flush.

## 2024-02-19 NOTE — Assessment & Plan Note (Signed)
 New onset left side numbness, June 2024  MRI cervical, thoracic, lumbar spine results were reviewed with patient.  I ask patient to contact neurosurgeon for evaluation.

## 2024-02-19 NOTE — Progress Notes (Signed)
 Hematology/Oncology Progress note [phone] [phone]) [phone]     CHIEF COMPLAINTS/REASON FOR VISIT:  Follow up for hodgkin's lymphoma, left side numbness   ASSESSMENT & PLAN:   Malignant lymphoma, Hodgkin's type (HCC) # Hodgkin's lymphoma, classic type S/p 2 cycles of ABVD and 4 cycles of AVD.  Worse of constitutional symptoms including night sweats, unintentional weight loss. Concerning for disease progression Recommend PET restaging and if confirmed increase activity, recommend biopsy   Blood in stool Refer to GI, he did not establish care. Currently he is interested, will refer again.  Cervical stenosis of spinal canal New onset left side numbness, June 2024  MRI cervical, thoracic, lumbar spine results were reviewed with patient.  I ask patient to contact neurosurgeon for evaluation.    Iron  deficiency anemia  Lab Results  Component Value Date   HGB 10.2 (L) 02/19/2024   TIBC 406 02/19/2024   IRONPCTSAT 5 (L) 02/19/2024   FERRITIN 17 (L) 02/19/2024      Hemoglobin has dropped.  Recommend IV Venofer  weekly x 3.  Port-A-Cath in place left antecubital fossa port, he has not flushed as instructed.  I previous discussed with vascular surgeon Dr.Dew.  Plan is to remove this port as it is associated with high risk of occlusion/thrombosis. However patient did not follow up with vascular surgery.  Continue port flush.   Acute pulmonary embolism (HCC) Continue Eliquis  5 mg twice daily. Despite him being compliant on anticoagulation, he continues to have chest pain and shortness of breath.  Repeat CT chest angiogram for further evaluation.  Neoplasm related pain Recommend oxycodone  5 mg every 8 hours as needed for pain while waiting for workup.    Orders Placed This Encounter  Procedures   CT Angio Chest Pulmonary Embolism (PE) W or WO Contrast    Standing Status:   Future    Expected Date:   02/26/2024    Expiration Date:   02/18/2025    If  indicated for the ordered procedure, I authorize the administration of contrast media per Radiology protocol:   Yes    Does the patient have a contrast media/X-ray dye allergy?:   No    Preferred imaging location?:   [facility]   NM PET Image Restag (PS) Skull Base To Thigh    Standing Status:   Future    Expected Date:   02/26/2024    Expiration Date:   02/18/2025    If indicated for the ordered procedure, I authorize the administration of a radiopharmaceutical per Radiology protocol:   Yes    Preferred imaging location?:   [facility]   Iron  and TIBC    Standing Status:   Future    Number of Occurrences:   1    Expected Date:   02/19/2024    Expiration Date:   05/19/2024   Ferritin    Standing Status:   Future    Number of Occurrences:   1    Expected Date:   02/19/2024    Expiration Date:   05/19/2024   Retic Panel    Standing Status:   Future    Number of Occurrences:   1    Expected Date:   02/19/2024    Expiration Date:   05/19/2024   Ambulatory referral to Gastroenterology    Referral Priority:   Routine    Referral Type:   Consultation    Referral Reason:   Specialty Services Required    Number of Visits Requested:   1  Follow up TBD, depending on PET scan results.   All questions were answered. The patient knows to call the clinic with any problems, questions or concerns.  [name], MD, PhD [facility] Hematology Oncology 02/19/2024     HISTORY OF PRESENTING ILLNESS:   Preston [name] is a  38 y.o.  male with PMH listed below was seen in consultation at the request of  [name], MD  for evaluation of Hodgkin's lymphoma.  Patient has a known diagnosis of Hodgkin's lymphoma and wants to transfer his oncology care to our cancer center.  Extensive medical records reviewed was performed  Previous oncology care was [facility] Per note  10/28/2019 biopsy of right chest wall mass, fibromuscular soft tissue with atypical infiltrate  consistent with Hodgkin lymphoma, classical type. There were scattered single atypical cells with morphology suggestive of Preston [name] variant cells. These cells were reactive with CD 45, CD 15, CD 30 and nonreactive for pan keratin, cam 5.7, CD3, CD5, CD10, CD 20, melan-A, Sox 10. Ki-67 stained majority of Reed-Sternberg like cells.  Noticed right upper chest wall lump in January 2021. He was incarcerated for 27 months, released in May 2021.  Unintentional weight loss about 30 pounds, drenching night sweats.  01/17/2020 PET scan showed bulky anterior mediastinal mass 12.7cm. with extensive adenopathy in neck, right axilla, mediastinum, bilateral hilar regions, upper abdominal ligament adenopathy and retroperitoneal adenopathy. Abnormal hypermetabolic activity within a splenic focus. Mild patchy ground glass opacity in the right lower lobe may reflect postobstructive inflammatory change without hypermetabolic activity.   01/17/2020 Echo 1. Normal biventricular systolic function and size. LVEF 60%. Average global longitudinal strain is normal, -20%   2. No significant valvular dysfunction.   3. Normal right-and left-sided filling pressures.  4. No prior study for comparison.  12/30/2019 LDH 588 01/24/2020 CRP 67.2, ESR 60  Stage IIIB Hodgkin's lymphoma, he was recommended ABVD x 2 cycles, followed by PET with plan of AVD x 4 or escalate to BEACOPP if not favorable results.   01/26/2020 ABVD Day 1,15 x 2 cycles.   03/24/2020 PET scan showed significant interval response to therapy. All PET positive lesions previously resolved 03/30/2020 AVD D1 04/13/2020 AVD D15  # He was not able to have chest medi port placed due to chest mass. He has central line placed on left upper extremity. #Central line access.  Patient has seen Dr. Marea for evaluation of the left upper extremity central line.  I discussed with Dr. Marea and tip is in good position in the SVC.  Okay to use from vascular surgeon's aspect.  [facility]  pathology consultation on biopsy specimen from 10/28/2019 showed findings compatible with classic Hodgkin's lymphoma.  # 06/26/2020 - 11/29/20201 - Cycle 4 AVD  He no showed for his chemotherapy appointment in mid December 2021.    08/07/2020 seen at that time he reports acute onset of chest pain, left shoulder, back and left arm. He was sent to emergency room to have a CT scan done to rule out acute pulmonary embolism 08/07/2020, CT chest angiogram showed no pulmonary emboli or acute chest vascular pathology. Superior mediastinal lymphadenopathy consistent with clinical history of lymphoma.  Largest node measures 8.3 x 8.3 x 3.8 cm.  Several other smaller but pathologic lymph nodes in the anterior mediastinum, second largest at the aortopulmonary window measuring 1.3 x 1.9 x 3.1 cm.  No pulmonary mass or nodule.  Patient also had negative troponin,Patient was discharged home for further follow-up with cancer  center.  ER physician provided a prescription of Percocet 5/325 mg 1 tablet every 4 hours as needed for severe pain.  I obtained additional work-up for work-up to see if there is lymphoma progression.  08/23/2020 PET scan showed dominant right paramidline anterior mediastinal mass lesion is stable in size comparing to her outside PET scan on 03/24/2020.  Lesion showed low-level FDG uptake [place] 3, not appreciably changed in the interval.  Mildly enlarged hepatoduodenal ligament lymph node measures minimally smaller on CT imaging today with [place] 2 uptake.  No additional area of unexpected or suspicious hypermetabolic zone.  No other findings of lymphadenopathy on noncontrast CT image today.  Patient was recommended to proceed with 2D echocardiogram and he had Echo done on 09/08/2020. The delay was due to him not showing up to appointment.  #09/08/2020  2D echocardiogram showed low normal LVEF 50% to 55%.  This is slightly decreased from his outside echocardiogram in June 2021 with LVEF of  60%.  09/01/2020- 09/15/2020 Cycle 5 AVD 2/222022 Cycle 6 D1 AVD  Neck shoulder pain worsen.   #10/18/2020  MRI cervical spine showed bulky degenerative disc extrusion at C5-C6 with severe spinal stenosis, spinal cord mass-effect and abnormal cord signal (edema and/or myelomalacia). Moderate to severe multifactorial right C6 foraminal stenosis. Smaller disc herniation at C3-C4, and lobulated disc bulging at C4-C5 with up to mild spinal stenosis at those levels. Up to moderate degenerative right C4, left C7, and moderate to severe bilateral C8 neural foraminal stenosis. 10/20/20 Patient underwent  C5-6 anterior cervical discectomy and fusion    11/10/2020 he was seen in the clinic prior to chemotherapy. At that time, he felt weak and has had weight loss due to his neck surgery. Therefore decision was made to hold chemo and give him 1-2 weeks for recovery.  Then he had multiple no show for chemotherapy treatment appointment.   # 5/26/.2022 cycle 6 D15 AVD.  #09/10/2021 CT chest abdomen pelvis w contrast showed stable mediastinal adenopathy, no progressive findings.  Stable low-attenuation celiac axis node.  No mesenteric or retroperitoneal or pelvic adenopathy.  Stable diffuse fatty infiltration of the liver.  Stable renal cyst.  Age advanced coronary artery calcifications.  # 03/12/22 CT chest abdomen pelvis w contrast showed 1. Slight interval increase in size of the dominant anterior mediastinal nodal tissue. 2. Additional anterior mediastinal lymph nodes and the low-density celiac axis lymph node are stable. 3. No new areas of adenopathy in the chest, abdomen or pelvis.  09/23/2022 CT chest abdomen pelvis w contrast showed 1. Decreased size of the dominant anterior mediastinal mass/lymph node conglomerate and adjacent mediastinal lymph nodes. 2. Stable low-attenuation celiac axis lymph nodes. 3. No new or progressive adenopathy in the chest, abdomen or pelvis and no splenomegaly. 4.  Aortic  Atherosclerosis  01/21/2023 CT chest w contrast  Stable mediastinal mass and other mediastinal lymph nodes. No new mass lesion, fluid collection or lymph node enlargement in the thorax.Fatty liver infiltration.Coronary artery calcifications. Please correlate for other coronary risk factors.   04/12/2023 CT chest abdomen pelvis showed 1. Increased size of the dominant anterior mediastinal mass and mediastinal lymph nodes, compatible with progressive lymphomatous disease 2. No lymphadenopathy below the diaphragm and no splenomegaly. 3.  Aortic Atherosclerosis (ICD10-I70.0)  INTERVAL HISTORY Preston [name] is a 38 y.o. male who has above history reviewed by me today presents for follow up visit for management of Hodgkin's  Lymphoma Patient is not compliant with his follow-up and no-show to his follow-up appointment.. 02/02/2024 -  02/03/2024, patient was hospitalized due to lightheaded, dizziness, status post fall. 02/02/2024, CTA chest showed acute pulm embolism with a distal segmental and subsegmental pulmonary arteries to the posterior intermediate left lower lobe.  No signs of right heart strain. Anterior mediastinal mass and adenopathy are similar in appearance to previous examination.  Aortic atherosclerosis.  Patient was treated with anticoagulation was switched to Eliquis  at discharge.  He finished 10 mg twice daily for a week and is currently 5 mg twice daily.  Patient reports that he has been compliant without missing doses. He reports ongoing chest pain/left arm pain and shortness of breath, not improved since discharge.  Poor appetite and decreased oral intake.  He has lost weight.   Review of Systems  Constitutional:  Positive for appetite change, fatigue and unexpected weight change. Negative for chills and fever.  HENT:   Negative for hearing loss and voice change.   Eyes:  Negative for eye problems and icterus.  Respiratory:  Positive for shortness of breath. Negative for  chest tightness and cough.   Cardiovascular:  Positive for chest pain. Negative for leg swelling.  Gastrointestinal:  Negative for abdominal distention, abdominal pain and nausea.  Endocrine: Negative for hot flashes.  Genitourinary:  Negative for difficulty urinating, dysuria and frequency.   Musculoskeletal:  Positive for back pain and neck pain. Negative for arthralgias.       Right rib cage pain  Skin:  Negative for itching and rash.  Neurological:  Positive for numbness. Negative for light-headedness.  Hematological:  Negative for adenopathy. Does not bruise/bleed easily.  Psychiatric/Behavioral:  Negative for confusion.     MEDICAL HISTORY:  Past Medical History:  Diagnosis Date   Alcohol abuse    Chronic low back pain    Hodgkin's lymphoma (HCC)    Stenosis of cervical spine    Left   Tobacco use     SURGICAL HISTORY: Past Surgical History:  Procedure Laterality Date   ANTERIOR CERVICAL DECOMP/DISCECTOMY FUSION N/A 10/20/2020   Procedure: ANTERIOR CERVICAL DECOMPRESSION/DISCECTOMY FUSION 1 LEVEL C5-C6;  Surgeon: Clois Fret, MD;  Location: [facility] ORS;  Service: Neurosurgery;  Laterality: N/A;    SOCIAL HISTORY: Social History   Socioeconomic History   Marital status: Married    Spouse name: Not on file   Number of children: Not on file   Years of education: Not on file   Highest education level: Not on file  Occupational History   Not on file  Tobacco Use   Smoking status: Every Day    Current packs/day: 0.50    Average packs/day: 0.5 packs/day for 20.0 years (10.0 ttl pk-yrs)    Types: Cigarettes   Smokeless tobacco: Never  Vaping Use   Vaping status: Never Used  Substance and Sexual Activity   Alcohol use: Yes    Alcohol/week: 3.0 standard drinks of alcohol    Types: 3 Cans of beer per week    Comment: daily   Drug use: Not Currently   Sexual activity: Yes    Partners: Female  Other Topics Concern   Not on file  Social History Narrative   Not  on file   Social Drivers of Health   Financial Resource Strain: Unknown (03/29/2020)   Received from [facility] [place]   Overall Financial Resource Strain (CARDIA)    Difficulty of Paying Living Expenses: Patient declined  Food Insecurity: No Food Insecurity (02/02/2024)   Hunger Vital Sign    Worried About Running Out of Food in  the Last Year: Never true    Ran Out of Food in the Last Year: Never true  Transportation Needs: Unmet Transportation Needs (02/02/2024)   PRAPARE - Administrator, Civil Service (Medical): Yes    Lack of Transportation (Non-Medical): Yes  Physical Activity: Not on file  Stress: Not on file  Social Connections: Unknown (10/08/2022)   [facility]   Social Network    Social Network: Not on file  Intimate Partner Violence: Not At Risk (02/02/2024)   Humiliation, Afraid, Rape, and Kick questionnaire    Fear of Current or Ex-Partner: No    Emotionally Abused: No    Physically Abused: No    Sexually Abused: No    FAMILY HISTORY: Family History  Problem Relation Age of Onset   Heart failure Mother    Pneumonia Father    Alcohol abuse Brother     ALLERGIES:  has no known allergies.  MEDICATIONS:  Current Outpatient Medications  Medication Sig Dispense Refill   apixaban  (ELIQUIS ) 5 MG TABS tablet Take 1 tablet (5 mg total) by mouth 2 (two) times daily. 60 tablet 6   oxyCODONE  (OXY IR/ROXICODONE ) 5 MG immediate release tablet Take 1 tablet (5 mg total) by mouth every 8 (eight) hours as needed for severe pain (pain score 7-10). 30 tablet 0   acetaminophen  (TYLENOL ) 500 MG tablet Take 1,000 mg by mouth every 6 (six) hours as needed for mild pain or moderate pain. (Patient not taking: Reported on 02/19/2024)     apixaban  (ELIQUIS ) 5 MG TABS tablet Take 2 tablets (10 mg total) by mouth 2 (two) times daily for 7 days, then take 1 tablet (5 mg total) by mouth 2 (two) times daily (Patient not taking: Reported on 02/19/2024) 74  tablet 0   gabapentin  (NEURONTIN ) 300 MG capsule Take 1 capsule by mouth twice daily for 7 days then increase to 3 times per day (Patient not taking: Reported on 02/19/2024) 90 capsule 2   No current facility-administered medications for this visit.   Facility-Administered Medications Ordered in Other Visits  Medication Dose Route Frequency Provider Last Rate Last Admin   heparin  lock flush 100 unit/mL  500 Units Intravenous Once Babara Call, MD       sodium chloride  flush (NS) 0.9 % injection 10 mL  10 mL Intravenous PRN Babara Call, MD   10 mL at 08/07/20 0826     PHYSICAL EXAMINATION: ECOG PERFORMANCE STATUS: 0 - Asymptomatic Vitals:   02/19/24 1425  BP: (!) 142/87  Pulse: 75  Resp: 18  Temp: (!) 97.2 F (36.2 C)  SpO2: 100%   Filed Weights   02/19/24 1425  Weight: 149 lb 1.6 oz (67.6 kg)    Physical Exam Constitutional:      General: He is not in acute distress. HENT:     Head: Normocephalic and atraumatic.  Eyes:     General: No scleral icterus. Cardiovascular:     Rate and Rhythm: Normal rate and regular rhythm.     Heart sounds: Normal heart sounds.  Pulmonary:     Effort: Pulmonary effort is normal. No respiratory distress.     Breath sounds: No wheezing.  Abdominal:     General: Bowel sounds are normal. There is no distension.     Palpations: Abdomen is soft.  Musculoskeletal:        General: No deformity. Normal range of motion.     Cervical back: Normal range of motion and neck supple.  Skin:  General: Skin is warm and dry.     Findings: No erythema or rash.  Neurological:     Mental Status: He is alert and oriented to person, place, and time. Mental status is at baseline.     Cranial Nerves: No cranial nerve deficit.     Coordination: Coordination normal.  Psychiatric:        Mood and Affect: Mood normal.   left upper arm central line port.      LABORATORY DATA:  I have reviewed the data as listed    Latest Ref Rng & Units 02/19/2024    2:15 PM  02/03/2024    3:33 AM 02/02/2024    3:05 AM  CBC  WBC 4.0 - 10.5 K/uL 6.0  5.6  7.6   Hemoglobin 13.0 - 17.0 g/dL 89.7  89.8  88.0   Hematocrit 39.0 - 52.0 % 28.9  29.5  34.4   Platelets 150 - 400 K/uL 259  215  269       Latest Ref Rng & Units 02/19/2024    2:15 PM 02/02/2024    3:05 AM 05/28/2023    8:33 PM  CMP  Glucose 70 - 99 mg/dL 896  859  72   BUN 6 - 20 mg/dL 13  8  9    Creatinine 0.61 - 1.24 mg/dL 9.30  9.19  9.26   Sodium 135 - 145 mmol/L 135  133  134   Potassium 3.5 - 5.1 mmol/L 3.7  3.5  5.1   Chloride 98 - 111 mmol/L 105  97  95   CO2 22 - 32 mmol/L 24  28  22    Calcium 8.9 - 10.3 mg/dL 8.8  9.5  9.3   Total Protein 6.5 - 8.1 g/dL 7.1  8.2    Total Bilirubin 0.0 - 1.2 mg/dL 0.6  1.9    Alkaline Phos 38 - 126 U/L 40  42    AST 15 - 41 U/L 16  47    ALT 0 - 44 U/L 13  37       RADIOGRAPHIC STUDIES: I have personally reviewed the radiological images as listed and agreed with the findings in the report. ECHOCARDIOGRAM COMPLETE Result Date: 02/02/2024    ECHOCARDIOGRAM REPORT   Patient Name:   DHILLON COMUNALE Date of Exam: 02/02/2024 Medical Rec #:  [id number]           Height:       70.0 in Accession #:    [id number]          Weight:       140.0 lb Date of Birth:  January 30, 1986            BSA:          1.794 m Patient Age:    37 years            BP:           123/72 mmHg Patient Gender: M                   HR:           76 bpm. Exam Location:  [facility] Procedure: 2D Echo, Cardiac Doppler, Color Doppler and Strain Analysis (Both            Spectral and Color Flow Doppler were utilized during procedure). Indications:     Syncope R55  History:         Patient has prior history of Echocardiogram examinations.  History of pulmonary embolism, history of lymphoma; Risk                  Factors:Current Smoker.  Sonographer:     Delon Dade Referring Phys:  [id number] DELAYNE LULLA SOLIAN Diagnosing Phys: Deatrice Cage MD  Sonographer Comments: Global longitudinal strain was  attempted. IMPRESSIONS  1. Left ventricular ejection fraction, by estimation, is 60 to 65%. The left ventricle has normal function. The left ventricle has no regional wall motion abnormalities. Left ventricular diastolic parameters were normal. The average left ventricular global longitudinal strain is -24.0 %. The global longitudinal strain is normal.  2. Right ventricular systolic function is normal. The right ventricular size is normal. Tricuspid regurgitation signal is inadequate for assessing PA pressure.  3. The mitral valve is normal in structure. No evidence of mitral valve regurgitation. No evidence of mitral stenosis.  4. The aortic valve is normal in structure. Aortic valve regurgitation is not visualized. No aortic stenosis is present.  5. The inferior vena cava is normal in size with greater than 50% respiratory variability, suggesting right atrial pressure of 3 mmHg. FINDINGS  Left Ventricle: Left ventricular ejection fraction, by estimation, is 60 to 65%. The left ventricle has normal function. The left ventricle has no regional wall motion abnormalities. The average left ventricular global longitudinal strain is -24.0 %. Strain was performed and the global longitudinal strain is normal. The left ventricular internal cavity size was normal in size. There is no left ventricular hypertrophy. Left ventricular diastolic parameters were normal. Right Ventricle: The right ventricular size is normal. No increase in right ventricular wall thickness. Right ventricular systolic function is normal. Tricuspid regurgitation signal is inadequate for assessing PA pressure. Left Atrium: Left atrial size was normal in size. Right Atrium: Right atrial size was normal in size. Pericardium: There is no evidence of pericardial effusion. Mitral Valve: The mitral valve is normal in structure. No evidence of mitral valve regurgitation. No evidence of mitral valve stenosis. MV peak gradient, 8.5 mmHg. The mean mitral valve  gradient is 3.0 mmHg. Tricuspid Valve: The tricuspid valve is normal in structure. Tricuspid valve regurgitation is not demonstrated. No evidence of tricuspid stenosis. Aortic Valve: The aortic valve is normal in structure. Aortic valve regurgitation is not visualized. No aortic stenosis is present. Aortic valve mean gradient measures 5.0 mmHg. Aortic valve peak gradient measures 11.0 mmHg. Aortic valve area, by VTI measures 2.77 cm. Pulmonic Valve: The pulmonic valve was normal in structure. Pulmonic valve regurgitation is trivial. No evidence of pulmonic stenosis. Aorta: The aortic root is normal in size and structure. Venous: The inferior vena cava is normal in size with greater than 50% respiratory variability, suggesting right atrial pressure of 3 mmHg. IAS/Shunts: No atrial level shunt detected by color flow Doppler.  LEFT VENTRICLE PLAX 2D LVIDd:         4.90 cm   Diastology LVIDs:         2.80 cm   LV e' medial:    14.80 cm/s LV PW:         1.00 cm   LV E/e' medial:  6.8 LV IVS:        0.80 cm   LV e' lateral:   12.90 cm/s LVOT diam:     2.00 cm   LV E/e' lateral: 7.8 LV SV:         74 LV SV Index:   41        2D Longitudinal Strain LVOT Area:  3.14 cm  2D Strain GLS Avg:     -24.0 %  RIGHT VENTRICLE RV Basal diam:  2.80 cm RV S prime:     15.70 cm/s TAPSE (M-mode): 2.2 cm LEFT ATRIUM             Index        RIGHT ATRIUM           Index LA diam:        2.60 cm 1.45 cm/m   RA Area:     13.30 cm LA Vol (A2C):   36.7 ml 20.46 ml/m  RA Volume:   30.70 ml  17.12 ml/m LA Vol (A4C):   17.7 ml 9.87 ml/m LA Biplane Vol: 26.2 ml 14.61 ml/m  AORTIC VALVE                     PULMONIC VALVE AV Area (Vmax):    2.57 cm      PV Vmax:        0.76 m/s AV Area (Vmean):   2.69 cm      PV Peak grad:   2.3 mmHg AV Area (VTI):     2.77 cm      RVOT Peak grad: 2 mmHg AV Vmax:           166.00 cm/s AV Vmean:          104.000 cm/s AV VTI:            0.268 m AV Peak Grad:      11.0 mmHg AV Mean Grad:      5.0 mmHg LVOT  Vmax:         136.00 cm/s LVOT Vmean:        89.100 cm/s LVOT VTI:          0.236 m LVOT/AV VTI ratio: 0.88  AORTA Ao Root diam: 3.10 cm Ao Asc diam:  2.70 cm MITRAL VALVE MV Area (PHT): 3.61 cm     SHUNTS MV Area VTI:   1.68 cm     Systemic VTI:  0.24 m MV Peak grad:  8.5 mmHg     Systemic Diam: 2.00 cm MV Mean grad:  3.0 mmHg MV Vmax:       1.46 m/s MV Vmean:      72.3 cm/s MV Decel Time: 210 msec MV E velocity: 101.00 cm/s MV A velocity: 97.90 cm/s MV E/A ratio:  1.03 Deatrice Cage MD Electronically signed by Deatrice Cage MD Signature Date/Time: 02/02/2024/3:51:04 PM    Final    CT Angio Chest PE W and/or Wo Contrast Result Date: 02/02/2024 CLINICAL DATA:  Pulmonary embolism suspected. Low to intermediate probability. Patient feels like he was going to pass out after eating. EXAM: CT ANGIOGRAPHY CHEST WITH CONTRAST TECHNIQUE: Multidetector CT imaging of the chest was performed using the standard protocol during bolus administration of intravenous contrast. Multiplanar CT image reconstructions and MIPs were obtained to evaluate the vascular anatomy. RADIATION DOSE REDUCTION: This exam was performed according to the departmental dose-optimization program which includes automated exposure control, adjustment of the mA and/or kV according to patient size and/or use of iterative reconstruction technique. CONTRAST:  75mL OMNIPAQUE  IOHEXOL  350 MG/ML SOLN COMPARISON:  04/11/2023 FINDINGS: Cardiovascular: There are filling defects identified within distal segmental and subsegmental pulmonary arteries to the posterior and medial left lower lobe, image 116/4. The RV to LV ratio is less than 1.0. Heart size is normal. No pericardial effusion. Age advanced atherosclerotic calcifications are noted. Mediastinum/Nodes:  Anterior mediastinal mass measures 6.9 x 3.6 cm, image 65/4. On the previous exam this measured 7.1 x 3.8 cm. Nodal conglomeration within the AP window measures 2.7 x 2.2 cm, image 60/4. Previously 2.9 x  2.2 cm. Pre-vascular lymph node measures 0.8 cm, image 57/4. Previously 0.9 cm.Trachea, thyroid gland and esophagus demonstrate no significant findings Lungs/Pleura: The central airways are grossly patent. No pleural effusion or airspace consolidation. No pneumothorax identified. Upper Abdomen: No acute abnormality. Musculoskeletal: No chest wall abnormality. No acute or significant osseous findings. Review of the MIP images confirms the above findings. IMPRESSION: 1. Examination is positive for acute pulmonary embolism within distal segmental and subsegmental pulmonary arteries to the posterior and medial left lower lobe. 2. No signs of right heart strain. 3. Anterior mediastinal mass and mediastinal adenopathy is similar in appearance to previous exam. Findings are compatible with known lymphoma. 4.  Aortic Atherosclerosis (ICD10-I70.0). Electronically Signed   By: Waddell Calk M.D.   On: 02/02/2024 05:46   DG Chest Portable 1 View Result Date: 02/02/2024 CLINICAL DATA:  Syncope EXAM: PORTABLE CHEST 1 VIEW COMPARISON:  04/11/2023, 05/07/2023 FINDINGS: Right paramediastinal mass, as previously demonstrated. No focal airspace consolidation or pulmonary edema. Left approach central venous catheter tip is in the mid SVC. No pleural effusion or pneumothorax. IMPRESSION: 1. No acute cardiopulmonary disease. 2. Right paramediastinal mass, as previously demonstrated. Electronically Signed   By: Franky Stanford M.D.   On: 02/02/2024 03:31   CT HEAD WO CONTRAST ( ) Result Date: 02/02/2024 CLINICAL DATA:  Recent syncopal episode with headaches and neck pain, initial encounter EXAM: CT HEAD WITHOUT CONTRAST CT CERVICAL SPINE WITHOUT CONTRAST TECHNIQUE: Multidetector CT imaging of the head and cervical spine was performed following the standard protocol without intravenous contrast. Multiplanar CT image reconstructions of the cervical spine were also generated. RADIATION DOSE REDUCTION: This exam was performed  according to the departmental dose-optimization program which includes automated exposure control, adjustment of the mA and/or kV according to patient size and/or use of iterative reconstruction technique. COMPARISON:  05/28/2023 FINDINGS: CT HEAD FINDINGS Brain: No evidence of acute infarction, hemorrhage, hydrocephalus, extra-axial collection or mass lesion/mass effect. Vascular: No hyperdense vessel or unexpected calcification. Skull: Normal. Negative for fracture or focal lesion. Sinuses/Orbits: Opacification of the left maxillary antrum is again seen stable from the prior exam. Other: None. CT CERVICAL SPINE FINDINGS Alignment: Mild straightening of the normal cervical lordosis is noted. Skull base and vertebrae: Interbody fusion is noted at C5-6 with anterior fixation. Mild osteophytic changes and facet hypertrophic changes are noted. No acute fracture or acute facet abnormality is noted. The odontoid is within normal limits. Soft tissues and spinal canal: Surrounding soft tissue structures are within normal limits. Upper chest: Visualized lung apices are unremarkable. Other: None IMPRESSION: CT of the head: No acute intracranial abnormality noted. Continued opacification of left maxillary antrum. CT of the cervical spine: Degenerative changes without acute abnormality. Electronically Signed   By: Oneil Devonshire M.D.   On: 02/02/2024 03:11   CT Cervical Spine Wo Contrast Result Date: 02/02/2024 CLINICAL DATA:  Recent syncopal episode with headaches and neck pain, initial encounter EXAM: CT HEAD WITHOUT CONTRAST CT CERVICAL SPINE WITHOUT CONTRAST TECHNIQUE: Multidetector CT imaging of the head and cervical spine was performed following the standard protocol without intravenous contrast. Multiplanar CT image reconstructions of the cervical spine were also generated. RADIATION DOSE REDUCTION: This exam was performed according to the departmental dose-optimization program which includes automated exposure  control, adjustment of the mA and/or  kV according to patient size and/or use of iterative reconstruction technique. COMPARISON:  05/28/2023 FINDINGS: CT HEAD FINDINGS Brain: No evidence of acute infarction, hemorrhage, hydrocephalus, extra-axial collection or mass lesion/mass effect. Vascular: No hyperdense vessel or unexpected calcification. Skull: Normal. Negative for fracture or focal lesion. Sinuses/Orbits: Opacification of the left maxillary antrum is again seen stable from the prior exam. Other: None. CT CERVICAL SPINE FINDINGS Alignment: Mild straightening of the normal cervical lordosis is noted. Skull base and vertebrae: Interbody fusion is noted at C5-6 with anterior fixation. Mild osteophytic changes and facet hypertrophic changes are noted. No acute fracture or acute facet abnormality is noted. The odontoid is within normal limits. Soft tissues and spinal canal: Surrounding soft tissue structures are within normal limits. Upper chest: Visualized lung apices are unremarkable. Other: None IMPRESSION: CT of the head: No acute intracranial abnormality noted. Continued opacification of left maxillary antrum. CT of the cervical spine: Degenerative changes without acute abnormality. Electronically Signed   By: Oneil Devonshire M.D.   On: 02/02/2024 03:11

## 2024-02-19 NOTE — Assessment & Plan Note (Signed)
 Continue Eliquis  5 mg twice daily. Despite him being compliant on anticoagulation, he continues to have chest pain and shortness of breath.  Repeat CT chest angiogram for further evaluation.

## 2024-02-19 NOTE — Assessment & Plan Note (Signed)
 Recommend oxycodone  5 mg every 8 hours as needed for pain while waiting for workup.

## 2024-02-19 NOTE — Assessment & Plan Note (Signed)
#   Hodgkin's lymphoma, classic type S/p 2 cycles of ABVD and 4 cycles of AVD.  Worse of constitutional symptoms including night sweats, unintentional weight loss. Concerning for disease progression Recommend PET restaging and if confirmed increase activity, recommend biopsy

## 2024-02-19 NOTE — Assessment & Plan Note (Signed)
 Refer to GI, he did not establish care. Currently he is interested, will refer again.

## 2024-02-19 NOTE — Assessment & Plan Note (Signed)
  Lab Results  Component Value Date   HGB 10.2 (L) 02/19/2024   TIBC 406 02/19/2024   IRONPCTSAT 5 (L) 02/19/2024   FERRITIN 17 (L) 02/19/2024      Hemoglobin has dropped.  Recommend IV Venofer  weekly x 3.

## 2024-02-20 ENCOUNTER — Telehealth: Payer: Self-pay

## 2024-02-20 ENCOUNTER — Telehealth: Payer: Self-pay | Admitting: *Deleted

## 2024-02-20 NOTE — Telephone Encounter (Signed)
 Per the pharmacist at [facility] on [facility] when with the prior Auth the only thing that they would let him have is 15 pills.  I spoke to [facility] I was to tell the patient to when he gets down to 2 pills to give us  a call and then she did try to do a prior Auth and get 30 next time.  Patient is okay with this plan

## 2024-02-20 NOTE — Telephone Encounter (Signed)
 Ref faxed to [facility]. Re: rectal bleeding. Fax confirmation received.

## 2024-02-23 ENCOUNTER — Other Ambulatory Visit: Payer: Self-pay | Admitting: *Deleted

## 2024-02-24 ENCOUNTER — Telehealth: Payer: Self-pay

## 2024-02-24 DIAGNOSIS — R0600 Dyspnea, unspecified: Secondary | ICD-10-CM

## 2024-02-24 NOTE — Telephone Encounter (Signed)
 Per [name], pt only got 15 tabs of Oxycodone  on 7/10. He is calling to request additional refill.   Dr. Babara would like for pt to get PET scan done asap to see where pain is coming from, prior to refilling medication. PET department has contacted pt to schedule and had to leave VM.  I also called and had to leave VM to let him know that PET scan needs to be schedued and left PET dept #. Also asked about chest pain and if pain is worse or he has increased shortness of breath to let us  know so we can change CT chest to STAT.

## 2024-02-24 NOTE — Telephone Encounter (Signed)
 Per secure chat with [place], PET scan dept has made a few attempt to contact pt to schedule scan

## 2024-02-25 NOTE — Addendum Note (Signed)
 Addended by: HEROLD ALMARIE PARAS on: 02/25/2024 04:52 PM   Modules accepted: Orders

## 2024-02-25 NOTE — Telephone Encounter (Signed)
 [name] was able to talk to pt and he states that he does have shortness of breath and pain to chest. He was also notified that medication was refilled until PET and CT have been done.   New STAT CT chest order has been placed. Please schedule.  Please check it PET can be moved up.   Pt would like a call with appt details. Thanks

## 2024-02-26 ENCOUNTER — Telehealth: Payer: Self-pay | Admitting: Oncology

## 2024-02-26 ENCOUNTER — Other Ambulatory Visit

## 2024-02-26 NOTE — Telephone Encounter (Signed)
 Spoke w/pt sister - told her that it was his doctors office returning a call. She said that the number we have to reach pt at, is his only number. I asked her to get pt to call his doctor back when he can. No response from pt yet.

## 2024-02-26 NOTE — Telephone Encounter (Signed)
 Called pt to resched CT - call went straight to vm - left vm - LH

## 2024-02-26 NOTE — Telephone Encounter (Signed)
 Called pt to reschedule CT and PET scans. No answer - phone went straight to vm. Left vm with call back info and info for PET scan - [facility]

## 2024-02-26 NOTE — Telephone Encounter (Signed)
 Called pt to reschedule STAT CT - call went straight to vm - left vm and told pt that we only have a few more spots left before the scheduled appt if he wants to reschedule. Also said that if he doesn't call back, we will assume that he is sticking with his original appt on 7/24.

## 2024-02-27 ENCOUNTER — Telehealth: Payer: Self-pay | Admitting: *Deleted

## 2024-02-27 NOTE — Telephone Encounter (Signed)
 The sister Jeanne called to say that someone was trying to get in touch with him yesterday.  The patient and the sister is on the phone with me and I told him that since he was having shortness of breath and chest pain we need to move the scan up and he can come Monday 721 have to be there [place] the medical mall at 9:30 it will take an hour and a half, I told him nothing to eat or drink for 6 hours prior to the scan except he can drink water for the scan to do and then at 1:00 he has to come over here to the cancer center and have IV iron .  Jeanne says she would get him over here

## 2024-03-01 ENCOUNTER — Inpatient Hospital Stay

## 2024-03-01 ENCOUNTER — Ambulatory Visit
Admission: RE | Admit: 2024-03-01 | Discharge: 2024-03-01 | Disposition: A | Discharge status: Home / Self Care | Source: Ambulatory Visit | Attending: Oncology | Admitting: Oncology

## 2024-03-01 ENCOUNTER — Ambulatory Visit

## 2024-03-01 VITALS — BP 114/63 | HR 72 | Resp 18

## 2024-03-01 DIAGNOSIS — N200 Calculus of kidney: Secondary | ICD-10-CM | POA: Insufficient documentation

## 2024-03-01 DIAGNOSIS — C8192 Hodgkin lymphoma, unspecified, intrathoracic lymph nodes: Secondary | ICD-10-CM | POA: Diagnosis not present

## 2024-03-01 DIAGNOSIS — C8170 Other classical Hodgkin lymphoma, unspecified site: Secondary | ICD-10-CM | POA: Insufficient documentation

## 2024-03-01 DIAGNOSIS — D500 Iron deficiency anemia secondary to blood loss (chronic): Secondary | ICD-10-CM

## 2024-03-01 DIAGNOSIS — C8190 Hodgkin lymphoma, unspecified, unspecified site: Secondary | ICD-10-CM | POA: Diagnosis present

## 2024-03-01 LAB — GLUCOSE, CAPILLARY: Glucose-Capillary: 101 mg/dL — ABNORMAL HIGH (ref 70–99)

## 2024-03-01 MED ORDER — DIPHENHYDRAMINE HCL 50 MG/ML IJ SOLN
25.0000 mg | INTRAMUSCULAR | Status: DC | PRN
  Administered 2024-03-01: 25 mg via INTRAVENOUS
  Filled 2024-03-01: qty 1

## 2024-03-01 MED ORDER — FLUDEOXYGLUCOSE F - 18 (FDG) INJECTION: 8.3500 | Freq: Once | INTRAVENOUS | Status: DC | PRN

## 2024-03-01 MED ORDER — IRON SUCROSE 20 MG/ML IV SOLN
200.0000 mg | Freq: Once | INTRAVENOUS | Status: AC
  Administered 2024-03-01: 200 mg via INTRAVENOUS
  Filled 2024-03-01: qty 10

## 2024-03-01 NOTE — Patient Instructions (Signed)

## 2024-03-02 ENCOUNTER — Telehealth: Payer: Self-pay | Admitting: Oncology

## 2024-03-02 ENCOUNTER — Ambulatory Visit

## 2024-03-02 NOTE — Telephone Encounter (Signed)
 Called pt to see if he would be able to make it to the med mall today for the CT - no answer - left vm

## 2024-03-02 NOTE — Telephone Encounter (Signed)
 Spoke w/radiology about pt still not having checked in 10 mins after the time his CT was scheduled for. Was told that he is able to come do his scan still because they could work him in today.   Left vm for pt reminding him of his 9AM CT scan, but told him to call us  because we were going to work him in the schedule today.

## 2024-03-03 ENCOUNTER — Telehealth: Payer: Self-pay | Admitting: Oncology

## 2024-03-03 NOTE — Telephone Encounter (Signed)
 Received message from GI stating that pt no showed to appt.

## 2024-03-03 NOTE — Telephone Encounter (Signed)
 Called pt to reschedule CT - no answer - left vm.

## 2024-03-04 ENCOUNTER — Ambulatory Visit

## 2024-03-05 ENCOUNTER — Ambulatory Visit

## 2024-03-08 ENCOUNTER — Inpatient Hospital Stay

## 2024-03-10 ENCOUNTER — Encounter: Payer: Self-pay | Admitting: Oncology

## 2024-03-10 ENCOUNTER — Other Ambulatory Visit: Payer: Self-pay

## 2024-03-10 DIAGNOSIS — D500 Iron deficiency anemia secondary to blood loss (chronic): Secondary | ICD-10-CM

## 2024-03-11 ENCOUNTER — Inpatient Hospital Stay

## 2024-03-15 ENCOUNTER — Inpatient Hospital Stay

## 2024-03-15 ENCOUNTER — Inpatient Hospital Stay: Attending: Oncology

## 2024-03-17 ENCOUNTER — Inpatient Hospital Stay

## 2024-03-24 ENCOUNTER — Ambulatory Visit: Admission: RE | Admit: 2024-03-24 | Source: Ambulatory Visit

## 2024-04-01 ENCOUNTER — Telehealth: Payer: Self-pay | Admitting: Oncology

## 2024-04-01 NOTE — Telephone Encounter (Signed)
 Called pt to r/s CT - left vm - LH

## 2024-04-05 ENCOUNTER — Telehealth: Payer: Self-pay | Admitting: Oncology

## 2024-04-05 NOTE — Telephone Encounter (Signed)
 Called pt to r/s CT - left vm - LH

## 2024-04-22 ENCOUNTER — Telehealth: Payer: Self-pay

## 2024-04-22 DIAGNOSIS — C8170 Other classical Hodgkin lymphoma, unspecified site: Secondary | ICD-10-CM

## 2024-04-22 DIAGNOSIS — I2699 Other pulmonary embolism without acute cor pulmonale: Secondary | ICD-10-CM

## 2024-04-22 DIAGNOSIS — D500 Iron deficiency anemia secondary to blood loss (chronic): Secondary | ICD-10-CM

## 2024-04-22 NOTE — Telephone Encounter (Signed)
 Please schedule follow-up with Dr. Babara to be in 1-2 months: port flush Lab/ MD. Please notify pt of appts.

## 2024-04-22 NOTE — Telephone Encounter (Signed)
-----   Message from [name] sent at 04/22/2024  8:49 AM EDT ----- Please arrange him to do a follow up lab MD with me cbc cmp [facility], non urgent. Thanks.

## 2024-05-05 ENCOUNTER — Encounter: Payer: Self-pay | Admitting: Oncology

## 2024-05-11 ENCOUNTER — Other Ambulatory Visit: Payer: Self-pay | Admitting: Oncology

## 2024-05-11 ENCOUNTER — Telehealth: Payer: Self-pay | Admitting: *Deleted

## 2024-05-11 DIAGNOSIS — D500 Iron deficiency anemia secondary to blood loss (chronic): Secondary | ICD-10-CM

## 2024-05-11 NOTE — Telephone Encounter (Signed)
 Lauren who does the [place] was talking to him and then he says that he is having back pain and chest pain.  Sent me a secure chat so I called the patient house it went to voice male and I told him that if he is having chest pain he needs to go to the ER.  He can call anytime we will try to help but if it is chest pain and she is going to go to the ER.

## 2024-05-12 ENCOUNTER — Encounter: Payer: Self-pay | Admitting: Oncology

## 2024-05-12 ENCOUNTER — Telehealth: Payer: Self-pay | Admitting: *Deleted

## 2024-05-12 NOTE — Telephone Encounter (Signed)
 Patient called yesterday tried to get in touch with him and could not get in touch.  I left a message that if he is still having chest pains he should go to the ER.  Then Dr. Babara said that he can come over and see what the [facility] people with him.  I made an appointment for tomorrow at 1:30.  I had to leave a voicemail but I did ask him to give me a call whether he is going to call him or not.  Left my telephone [phone]

## 2024-05-17 ENCOUNTER — Ambulatory Visit
Admission: RE | Admit: 2024-05-17 | Discharge: 2024-05-17 | Disposition: A | Discharge status: Home / Self Care | Source: Ambulatory Visit | Attending: Oncology | Admitting: Oncology

## 2024-05-17 DIAGNOSIS — D500 Iron deficiency anemia secondary to blood loss (chronic): Secondary | ICD-10-CM | POA: Insufficient documentation

## 2024-05-17 MED ORDER — IOHEXOL 350 MG/ML SOLN
75.0000 mL | Freq: Once | INTRAVENOUS | Status: AC | PRN
  Administered 2024-05-17: 75 mL via INTRAVENOUS

## 2024-05-23 ENCOUNTER — Encounter: Payer: Self-pay | Admitting: Emergency Medicine

## 2024-05-23 ENCOUNTER — Other Ambulatory Visit: Payer: Self-pay

## 2024-05-23 ENCOUNTER — Observation Stay
Admission: EM | Admit: 2024-05-23 | Discharge: 2024-05-25 | Disposition: A | Attending: Internal Medicine | Admitting: Internal Medicine

## 2024-05-23 ENCOUNTER — Emergency Department

## 2024-05-23 DIAGNOSIS — F17210 Nicotine dependence, cigarettes, uncomplicated: Secondary | ICD-10-CM | POA: Diagnosis not present

## 2024-05-23 DIAGNOSIS — R0789 Other chest pain: Secondary | ICD-10-CM

## 2024-05-23 DIAGNOSIS — Z452 Encounter for adjustment and management of vascular access device: Secondary | ICD-10-CM | POA: Insufficient documentation

## 2024-05-23 DIAGNOSIS — Z8571 Personal history of Hodgkin lymphoma: Secondary | ICD-10-CM | POA: Insufficient documentation

## 2024-05-23 DIAGNOSIS — Z86711 Personal history of pulmonary embolism: Secondary | ICD-10-CM | POA: Diagnosis not present

## 2024-05-23 DIAGNOSIS — Z720 Tobacco use: Secondary | ICD-10-CM | POA: Diagnosis present

## 2024-05-23 DIAGNOSIS — Z7901 Long term (current) use of anticoagulants: Secondary | ICD-10-CM | POA: Diagnosis not present

## 2024-05-23 DIAGNOSIS — Z95828 Presence of other vascular implants and grafts: Secondary | ICD-10-CM

## 2024-05-23 DIAGNOSIS — J188 Other pneumonia, unspecified organism: Principal | ICD-10-CM

## 2024-05-23 DIAGNOSIS — F1090 Alcohol use, unspecified, uncomplicated: Secondary | ICD-10-CM | POA: Diagnosis not present

## 2024-05-23 DIAGNOSIS — J189 Pneumonia, unspecified organism: Secondary | ICD-10-CM | POA: Diagnosis not present

## 2024-05-23 DIAGNOSIS — C8190 Hodgkin lymphoma, unspecified, unspecified site: Secondary | ICD-10-CM | POA: Diagnosis present

## 2024-05-23 HISTORY — DX: Personal history of pulmonary embolism: Z86.711

## 2024-05-23 LAB — PROTIME-INR
INR: 0.9 (ref 0.8–1.2)
Prothrombin Time: 13 s (ref 11.4–15.2)

## 2024-05-23 LAB — STREP PNEUMONIAE URINARY ANTIGEN: Strep Pneumo Urinary Antigen: NEGATIVE

## 2024-05-23 LAB — CBC WITH DIFFERENTIAL/PLATELET
Abs Immature Granulocytes: 0.01 K/uL (ref 0.00–0.07)
Basophils Absolute: 0 K/uL (ref 0.0–0.1)
Basophils Relative: 0 %
Eosinophils Absolute: 0.1 K/uL (ref 0.0–0.5)
Eosinophils Relative: 1 %
HCT: 33.3 % — ABNORMAL LOW (ref 39.0–52.0)
Hemoglobin: 11.7 g/dL — ABNORMAL LOW (ref 13.0–17.0)
Immature Granulocytes: 0 %
Lymphocytes Relative: 22 %
Lymphs Abs: 1.5 K/uL (ref 0.7–4.0)
MCH: 26.2 pg (ref 26.0–34.0)
MCHC: 35.1 g/dL (ref 30.0–36.0)
MCV: 74.7 fL — ABNORMAL LOW (ref 80.0–100.0)
Monocytes Absolute: 0.7 K/uL (ref 0.1–1.0)
Monocytes Relative: 10 %
Neutro Abs: 4.7 K/uL (ref 1.7–7.7)
Neutrophils Relative %: 67 %
Platelets: 311 K/uL (ref 150–400)
RBC: 4.46 MIL/uL (ref 4.22–5.81)
RDW: 17.9 % — ABNORMAL HIGH (ref 11.5–15.5)
WBC: 7 K/uL (ref 4.0–10.5)
nRBC: 0 % (ref 0.0–0.2)

## 2024-05-23 LAB — URINALYSIS, W/ REFLEX TO CULTURE (INFECTION SUSPECTED)
Bacteria, UA: NONE SEEN
Bilirubin Urine: NEGATIVE
Glucose, UA: NEGATIVE mg/dL
Hgb urine dipstick: NEGATIVE
Ketones, ur: NEGATIVE mg/dL
Leukocytes,Ua: NEGATIVE
Nitrite: NEGATIVE
Protein, ur: NEGATIVE mg/dL
RBC / HPF: 0 RBC/hpf (ref 0–5)
Specific Gravity, Urine: 1.012 (ref 1.005–1.030)
Squamous Epithelial / HPF: 0 /HPF (ref 0–5)
pH: 7 (ref 5.0–8.0)

## 2024-05-23 LAB — COMPREHENSIVE METABOLIC PANEL WITH GFR
ALT: 17 U/L (ref 0–44)
AST: 26 U/L (ref 15–41)
Albumin: 4 g/dL (ref 3.5–5.0)
Alkaline Phosphatase: 42 U/L (ref 38–126)
Anion gap: 10 (ref 5–15)
BUN: 7 mg/dL (ref 6–20)
CO2: 29 mmol/L (ref 22–32)
Calcium: 9.1 mg/dL (ref 8.9–10.3)
Chloride: 102 mmol/L (ref 98–111)
Creatinine, Ser: 0.71 mg/dL (ref 0.61–1.24)
GFR, Estimated: >60 mL/min (ref >60)
Glucose, Bld: 81 mg/dL (ref 70–99)
Potassium: 3.6 mmol/L (ref 3.5–5.1)
Sodium: 141 mmol/L (ref 135–145)
Total Bilirubin: 0.6 mg/dL (ref 0.0–1.2)
Total Protein: 8.1 g/dL (ref 6.5–8.1)

## 2024-05-23 LAB — TYPE AND SCREEN
ABO/RH(D): O POS
Antibody Screen: NEGATIVE

## 2024-05-23 LAB — TROPONIN I (HIGH SENSITIVITY): Troponin I (High Sensitivity): 6 ng/L (ref <18)

## 2024-05-23 LAB — HIV ANTIBODY (ROUTINE TESTING W REFLEX): HIV Screen 4th Generation wRfx: NONREACTIVE

## 2024-05-23 LAB — LACTIC ACID, PLASMA
Lactic Acid, Venous: 1.8 mmol/L (ref 0.5–1.9)
Lactic Acid, Venous: 2.8 mmol/L (ref 0.5–1.9)

## 2024-05-23 LAB — MAGNESIUM: Magnesium: 2 mg/dL (ref 1.7–2.4)

## 2024-05-23 LAB — PROCALCITONIN
Procalcitonin: <0.1 ng/mL
Procalcitonin: <0.1 ng/mL

## 2024-05-23 LAB — RESP PANEL BY RT-PCR (RSV, FLU A&B, COVID)  RVPGX2
Influenza A by PCR: NEGATIVE
Influenza B by PCR: NEGATIVE
Resp Syncytial Virus by PCR: NEGATIVE
SARS Coronavirus 2 by RT PCR: NEGATIVE

## 2024-05-23 MED ORDER — KETOROLAC TROMETHAMINE 30 MG/ML IJ SOLN
30.0000 mg | Freq: Once | INTRAMUSCULAR | Status: AC
  Administered 2024-05-23: 30 mg via INTRAVENOUS
  Filled 2024-05-23: qty 1

## 2024-05-23 MED ORDER — SODIUM CHLORIDE 0.9 % IV SOLN
500.0000 mg | INTRAVENOUS | Status: DC
  Administered 2024-05-23: 500 mg via INTRAVENOUS
  Filled 2024-05-23: qty 5

## 2024-05-23 MED ORDER — ACETAMINOPHEN 325 MG PO TABS: 650.0000 mg | ORAL_TABLET | Freq: Four times a day (QID) | ORAL | Status: DC | PRN

## 2024-05-23 MED ORDER — AZITHROMYCIN 500 MG IV SOLR
500.0000 mg | Freq: Once | INTRAVENOUS | Status: DC
  Filled 2024-05-23: qty 5

## 2024-05-23 MED ORDER — APIXABAN 5 MG PO TABS
5.0000 mg | ORAL_TABLET | Freq: Two times a day (BID) | ORAL | Status: DC
  Administered 2024-05-23 – 2024-05-25 (×4): 5 mg via ORAL
  Filled 2024-05-23 (×4): qty 1

## 2024-05-23 MED ORDER — ENOXAPARIN SODIUM 40 MG/0.4ML IJ SOSY
40.0000 mg | PREFILLED_SYRINGE | INTRAMUSCULAR | Status: DC
  Administered 2024-05-23: 40 mg via SUBCUTANEOUS
  Filled 2024-05-23: qty 0.4

## 2024-05-23 MED ORDER — ONDANSETRON HCL 4 MG PO TABS: 4.0000 mg | ORAL_TABLET | Freq: Four times a day (QID) | ORAL | Status: DC | PRN

## 2024-05-23 MED ORDER — ONDANSETRON HCL 4 MG/2ML IJ SOLN: 4.0000 mg | Freq: Four times a day (QID) | INTRAMUSCULAR | Status: DC | PRN

## 2024-05-23 MED ORDER — HYDROCODONE-ACETAMINOPHEN 5-325 MG PO TABS
1.0000 | ORAL_TABLET | ORAL | Status: DC | PRN
  Administered 2024-05-23: 2 via ORAL
  Filled 2024-05-23: qty 2

## 2024-05-23 MED ORDER — MORPHINE SULFATE (PF) 4 MG/ML IV SOLN
4.0000 mg | Freq: Once | INTRAVENOUS | Status: AC
  Administered 2024-05-23: 4 mg via INTRAVENOUS
  Filled 2024-05-23: qty 1

## 2024-05-23 MED ORDER — ACETAMINOPHEN 650 MG RE SUPP: 650.0000 mg | Freq: Four times a day (QID) | RECTAL | Status: DC | PRN

## 2024-05-23 MED ORDER — SODIUM CHLORIDE 0.9 % IV SOLN
500.0000 mg | INTRAVENOUS | Status: DC
  Administered 2024-05-24 – 2024-05-25 (×2): 500 mg via INTRAVENOUS
  Filled 2024-05-23 (×2): qty 5

## 2024-05-23 MED ORDER — OXYCODONE HCL 5 MG PO TABS
5.0000 mg | ORAL_TABLET | ORAL | Status: DC | PRN
Start: 2024-05-23 — End: 2024-05-25
  Administered 2024-05-23 – 2024-05-25 (×9): 5 mg via ORAL
  Filled 2024-05-23 (×9): qty 1

## 2024-05-23 MED ORDER — SODIUM CHLORIDE 0.9 % IV BOLUS (SEPSIS)
1000.0000 mL | Freq: Once | INTRAVENOUS | Status: AC
  Administered 2024-05-23: 1000 mL via INTRAVENOUS

## 2024-05-23 MED ORDER — SODIUM CHLORIDE 0.9 % IV SOLN
2.0000 g | INTRAVENOUS | Status: DC
  Administered 2024-05-23 – 2024-05-25 (×3): 2 g via INTRAVENOUS
  Filled 2024-05-23 (×2): qty 20

## 2024-05-23 MED ORDER — CEFTRIAXONE SODIUM 2 G IJ SOLR
2.0000 g | Freq: Once | INTRAMUSCULAR | Status: DC
  Filled 2024-05-23: qty 20

## 2024-05-23 MED ORDER — GABAPENTIN 300 MG PO CAPS
300.0000 mg | ORAL_CAPSULE | Freq: Three times a day (TID) | ORAL | Status: DC
  Administered 2024-05-23 – 2024-05-25 (×7): 300 mg via ORAL
  Filled 2024-05-23 (×7): qty 1

## 2024-05-23 MED ORDER — ONDANSETRON HCL 4 MG/2ML IJ SOLN
4.0000 mg | Freq: Once | INTRAMUSCULAR | Status: AC
  Administered 2024-05-23: 4 mg via INTRAVENOUS
  Filled 2024-05-23: qty 2

## 2024-05-23 MED ORDER — ALBUTEROL SULFATE (2.5 MG/3ML) 0.083% IN NEBU
2.5000 mg | INHALATION_SOLUTION | Freq: Four times a day (QID) | RESPIRATORY_TRACT | Status: DC
  Administered 2024-05-23 (×2): 2.5 mg via RESPIRATORY_TRACT
  Filled 2024-05-23 (×2): qty 3

## 2024-05-23 MED ORDER — ALBUTEROL SULFATE (2.5 MG/3ML) 0.083% IN NEBU
2.5000 mg | INHALATION_SOLUTION | Freq: Three times a day (TID) | RESPIRATORY_TRACT | Status: DC
  Administered 2024-05-24: 2.5 mg via RESPIRATORY_TRACT
  Filled 2024-05-23: qty 3

## 2024-05-23 MED ORDER — GUAIFENESIN ER 600 MG PO TB12
600.0000 mg | ORAL_TABLET | Freq: Two times a day (BID) | ORAL | Status: DC
  Administered 2024-05-23 – 2024-05-25 (×5): 600 mg via ORAL
  Filled 2024-05-23 (×5): qty 1

## 2024-05-23 NOTE — Plan of Care (Signed)

## 2024-05-23 NOTE — Plan of Care (Signed)

## 2024-05-23 NOTE — ED Triage Notes (Addendum)
 To ER from home via EMS for report of left sided rib pain that started about 1 week ago after aggressive coughing. Feels like he's being stabbed. Intermittent sweats. Painful to take a deep breath.  Currently being treated for lymphoma. History of left lung blood clot, hasn't had eliquis  for past 2 months. CTA performed on 05/17/24.

## 2024-05-23 NOTE — ED Notes (Signed)
 First Nurse Note:  Patient BIB EMS for evaluation of L sided rib pain x 1 week.  Reports pain has been getting worse. Hx of PE.  Has been out of Eliquis  x 1 month.  Lung sounds clear bilaterally.   BP 156/82 SpO2 97% HR 86

## 2024-05-23 NOTE — ED Provider Notes (Addendum)
 [facility] Provider Note    Event Date/Time   First MD Initiated Contact with Patient 05/23/24 [phone]     (approximate)   History   Rib Injury (Left)   HPI  Preston [name] is a 38 y.o. male with history of anemia, Hodgkin's lymphoma, PE in June 2025 no longer on Eliquis  due to his own noncompliance, alcohol use disorder who presents to the emergency department with cough, congestion, subjective fevers and chills and now having left-sided sharp chest pain worse with coughing, deep inspiration and movement.  Patient states he just had a CT scan of his chest on 05/17/2024 to evaluate for PE.  At that time he was not having any symptoms but states he started feeling bad the next day.  He has not been on any antibiotics for pneumonia.  He is no longer taking his Eliquis .  No calf tenderness or calf swelling.  States he has not had any chemotherapy in about a year.  Followed by Dr. Babara with oncology.   History provided by patient.    Past Medical History:  Diagnosis Date   Alcohol abuse    Chronic low back pain    History of pulmonary embolus (PE)    Hodgkin's lymphoma (HCC)    Stenosis of cervical spine    Left   Tobacco use     Past Surgical History:  Procedure Laterality Date   ANTERIOR CERVICAL DECOMP/DISCECTOMY FUSION N/A 10/20/2020   Procedure: ANTERIOR CERVICAL DECOMPRESSION/DISCECTOMY FUSION 1 LEVEL C5-C6;  Surgeon: Clois Fret, MD;  Location: [facility] ORS;  Service: Neurosurgery;  Laterality: N/A;    MEDICATIONS:  Prior to Admission medications   Medication Sig Start Date End Date Taking? Authorizing Provider  acetaminophen  (TYLENOL ) 500 MG tablet Take 1,000 mg by mouth every 6 (six) hours as needed for mild pain or moderate pain. Patient not taking: Reported on 02/19/2024    [provider]  apixaban  (ELIQUIS ) 5 MG TABS tablet Take 1 tablet (5 mg total) by mouth 2 (two) times daily. 02/11/24   Dorinda Drue DASEN, MD  apixaban  (ELIQUIS )  5 MG TABS tablet Take 2 tablets (10 mg total) by mouth 2 (two) times daily for 7 days, then take 1 tablet (5 mg total) by mouth 2 (two) times daily Patient not taking: Reported on 02/19/2024 02/03/24 03/04/24  Dorinda Drue DASEN, MD  gabapentin  (NEURONTIN ) 300 MG capsule Take 1 capsule by mouth twice daily for 7 days then increase to 3 times per day Patient not taking: Reported on 02/19/2024 03/07/23   Tonette Lauraine HERO, PA-C  oxyCODONE  (OXY IR/ROXICODONE ) 5 MG immediate release tablet Take 1 tablet (5 mg total) by mouth every 8 (eight) hours as needed for severe pain (pain score 7-10). 02/19/24   Babara Call, MD    Physical Exam   Triage Vital Signs: ED Triage Vitals  Encounter Vitals Group     BP 05/23/24 0249 (!) 144/104     Girls Systolic BP Percentile --      Girls Diastolic BP Percentile --      Boys Systolic BP Percentile --      Boys Diastolic BP Percentile --      Pulse Rate 05/23/24 0249 69     Resp 05/23/24 0249 17     Temp 05/23/24 0249 98.7 F (37.1 C)     Temp Source 05/23/24 0249 Oral     SpO2 05/23/24 0249 95 %     Weight 05/23/24 0250 144 lb (65.3 kg)  Height 05/23/24 0250 5' 10 (1.778 m)     Head Circumference --      Peak Flow --      Pain Score 05/23/24 0250 8     Pain Loc --      Pain Education --      Exclude from Growth Chart --     Most recent vital signs: Vitals:   05/23/24 0500 05/23/24 0553  BP: (!) 143/92 (!) 142/95  Pulse: 92 88  Resp:  18  Temp:  98.5 F (36.9 C)  SpO2: 100% 100%    CONSTITUTIONAL: Alert, responds appropriately to questions.  Appears uncomfortable, afebrile but feels warm to touch, nontoxic in appearance, thin HEAD: Normocephalic, atraumatic EYES: Conjunctivae clear, pupils appear equal, sclera nonicteric ENT: normal nose; moist mucous membranes NECK: Supple, normal ROM CARD: RRR; S1 and S2 appreciated; tender over the left chest wall without crepitus, deformity, rash or other skin changes RESP: Normal chest excursion without  splinting or tachypnea; breath sounds clear and equal bilaterally; no wheezes, no rhonchi, no rales, no hypoxia or respiratory distress, speaking full sentences ABD/GI: Non-distended; soft, non-tender, no rebound, no guarding, no peritoneal signs BACK: The back appears normal EXT: Normal ROM in all joints; no deformity noted, no edema, no calf tenderness or calf swelling SKIN: Normal color for age and race; warm; no rash on exposed skin NEURO: Moves all extremities equally, normal speech PSYCH: The patient's mood and manner are appropriate.   ED Results / Procedures / Treatments   LABS: (all labs ordered are listed, but only abnormal results are displayed) Labs Reviewed  LACTIC ACID, PLASMA - Abnormal; Notable for the following components:      Result Value   Lactic Acid, Venous 2.8 (*)    All other components within normal limits  CBC WITH DIFFERENTIAL/PLATELET - Abnormal; Notable for the following components:   Hemoglobin 11.7 (*)    HCT 33.3 (*)    MCV 74.7 (*)    RDW 17.9 (*)    All other components within normal limits  RESP PANEL BY RT-PCR (RSV, FLU A&B, COVID)  RVPGX2  CULTURE, BLOOD (SINGLE)  CULTURE, BLOOD (SINGLE)  RESPIRATORY PANEL BY PCR  PROCALCITONIN  LACTIC ACID, PLASMA  PROTIME-INR  COMPREHENSIVE METABOLIC PANEL WITH GFR  MAGNESIUM  URINALYSIS, W/ REFLEX TO CULTURE (INFECTION SUSPECTED)  TYPE AND SCREEN  TROPONIN I (HIGH SENSITIVITY)     EKG:  EKG Interpretation Date/Time:  Sunday May 23 2024 02:59:30 EDT Ventricular Rate:  98 PR Interval:  148 QRS Duration:  82 QT Interval:  388 QTC Calculation: 495 R Axis:   70  Text Interpretation: Normal sinus rhythm Possible Left atrial enlargement Prolonged QT Abnormal ECG When compared with ECG of 02-Feb-2024 03:14, PREVIOUS ECG IS PRESENT Confirmed by Neomi Neptune [phone]) on 05/23/2024 3:53:43 AM         RADIOLOGY: My personal review and interpretation of imaging: Chest x-ray shows no infiltrate,  edema, rib fracture, pneumothorax.  I have personally reviewed all radiology reports.   DG Chest 2 View Result Date: 05/23/2024 CLINICAL DATA:  Left-sided rib pain for 1 week EXAM: CHEST - 2 VIEW COMPARISON:  05/17/2024 FINDINGS: Cardiac shadow is within normal limits. Mediastinal fullness is noted related to known anterior mediastinal mass. Lungs are clear. No bony abnormality is noted. Left PICC is seen in satisfactory position. IMPRESSION: Changes consistent with the known mediastinal mass. No acute abnormality noted. Electronically Signed   By: Oneil Devonshire M.D.   On: 05/23/2024 03:37  PROCEDURES:  Critical Care performed: No      .1-3 Lead EKG Interpretation  Performed by: Rayola Everhart, Josette SAILOR, DO Authorized by: Arius Harnois, Josette SAILOR, DO     Interpretation: normal     ECG rate:  88   ECG rate assessment: normal     Rhythm: sinus rhythm     Ectopy: none     Conduction: normal       IMPRESSION / MDM / ASSESSMENT AND PLAN / ED COURSE  I reviewed the triage vital signs and the nursing notes.    Patient here with left-sided chest pain, cough, shortness of breath, subjective fevers.  The patient is on the cardiac monitor to evaluate for evidence of arrhythmia and/or significant heart rate changes.   DIFFERENTIAL DIAGNOSIS (includes but not limited to):   Pneumonia, viral URI, chest wall pain, rib fracture, pleurisy, PE, ACS, recurrent malignancy   Patient's presentation is most consistent with acute presentation with potential threat to life or bodily function.   PLAN: Will obtain labs, cultures, chest x-ray.  CT scan of the chest on 05/17/2024 reviewed and interpreted by myself and the radiologist and shows no PE.  He does have new patchy airspace opacity in the right lower lobe, left upper lobe concerning for multifocal pneumonia versus pulmonary hemorrhage versus pneumonitis.  He states he is no longer on his Eliquis  and he is hemodynamically stable here.  My suspicion for  hemorrhage is low and I suspect that this is more likely pneumonia.  Will give Rocephin, azithromycin for coverage for community-acquired pneumonia.  Will give pain medication here for what I suspect is chest wall pain.  No rib fracture seen at that time.  EKG here nonischemic.   MEDICATIONS GIVEN IN ED: Medications  enoxaparin  (LOVENOX ) injection 40 mg (has no administration in time range)  acetaminophen  (TYLENOL ) tablet 650 mg (has no administration in time range)    Or  acetaminophen  (TYLENOL ) suppository 650 mg (has no administration in time range)  ondansetron  (ZOFRAN ) tablet 4 mg (has no administration in time range)    Or  ondansetron  (ZOFRAN ) injection 4 mg (has no administration in time range)  cefTRIAXone (ROCEPHIN) 2 g in sodium chloride  0.9 % 100 mL IVPB ( Intravenous Infusion Verify 05/23/24 0646)  HYDROcodone -acetaminophen  (NORCO/VICODIN) 5-325 MG per tablet 1-2 tablet (2 tablets Oral Given 05/23/24 0620)  guaiFENesin (MUCINEX) 12 hr tablet 600 mg (600 mg Oral Given 05/23/24 0620)  albuterol (PROVENTIL) (2.5 MG/3ML) 0.083% nebulizer solution 2.5 mg (has no administration in time range)  azithromycin (ZITHROMAX) 500 mg in sodium chloride  0.9 % 250 mL IVPB (has no administration in time range)  morphine  (PF) 4 MG/ML injection 4 mg (4 mg Intravenous Given 05/23/24 0505)  ondansetron  (ZOFRAN ) injection 4 mg (4 mg Intravenous Given 05/23/24 0505)  sodium chloride  0.9 % bolus 1,000 mL (1,000 mLs Intravenous New Bag/Given 05/23/24 0505)  ketorolac  (TORADOL ) 30 MG/ML injection 30 mg (30 mg Intravenous Given 05/23/24 0505)     ED COURSE: Labs today show no leukocytosis.  Hemoglobin of 11.7 which has improved compared to previous.  Normal electrolytes, LFTs.  Procalcitonin negative.  Lactic normal.  COVID, flu and RSV negative.  Suspect possible viral pneumonia.  He has received antibiotics however and recommended admission given intermittent increased work of breathing on my exam which  could be related to pain.  No hypoxia.  Doubt that he has developed a new PE in 5 days.  I do not feel repeat CT imaging warranted as I feel  risk of radiation outweigh benefit.  Will continue to closely monitor.  Will send respiratory viral panel to check for other viral illnesses especially given I have seen multiple patients recently with enterovirus/rhinovirus causing significant URI symptoms.  Troponin is negative.  EKG nonischemic.  Pain reproducible with deep inspiration and palpation.  Low suspicion for ACS today.   CONSULTS:  Consulted and discussed patient's case with hospitalist, Dr. Cleatus.  I have recommended admission and consulting physician agrees and will place admission orders.  Patient (and family if present) agree with this plan.   I reviewed all nursing notes, vitals, pertinent previous records.  All labs, EKGs, imaging ordered have been independently reviewed and interpreted by myself.    OUTSIDE RECORDS REVIEWED: Reviewed last admission in June 2025 for syncope, PE.  Reviewed oncology notes.       FINAL CLINICAL IMPRESSION(S) / ED DIAGNOSES   Final diagnoses:  Multifocal pneumonia  Chest wall pain     Rx / DC Orders   ED Discharge Orders     None        Note:  This document was prepared using Dragon voice recognition software and may include unintentional dictation errors.   Lorie Cleckley, Josette SAILOR, DO 05/23/24 [phone]    Alisi Lupien, Josette SAILOR, DO 05/23/24 [phone]

## 2024-05-23 NOTE — ED Notes (Signed)
 Patient urinated prior to coming to room. Informed need of urine sample for next time. Verbalized understanding. Urinal at bedside

## 2024-05-23 NOTE — H&P (Signed)
 History and Physical    Tanav Orsak FMW:[id number] DOB: [birth year] DOA: 05/23/2024  DOS: the patient was seen and examined on 05/23/2024  PCP: Patient, No Pcp Per   Patient coming from: Home  I have personally briefly reviewed patient's old medical records in [facility]  Chief Complaint: Left sided rib pain  HPI: Preston [name] is a pleasant 38 y.o. male with medical history significant for Hodgkin's lymphoma under Dr. Layvonne service, history of PE in June 2025 not on Eliquis  due to noncompliance, low back pain, spinal stenosis, tobacco use and alcohol use disorder who presented to ED complaining of cough, congestion, subjective fever and chills with a left-sided chest and rib pain worsened during coughing, deep inspiration and movement.  He stated that chest pain is 7/10, pleuritic in nature, nonradiating,  associated with cough.  He had this CT scan done on 05/16/2024 to evaluate for PE.  At that time he did not have any symptoms of cough congestion or chest pain but he started having those symptoms next day.  He denies any calf tenderness or swelling of the leg.  He is not on chemo for the last 1 year or so.  Patient denies hematemesis, melena, nausea, vomiting, palpitations.  ED Course: Upon arrival to the ED, patient is found to be having left-sided chest pain, lactic acidosis at 2.8, chest x-ray no infiltrate.  CT scan of the chest on 05/17/2024 showing left-sided pneumonia.  Patient was given ceftriaxone and azithromycin.  Cultures were sent.  Hospitalist service was consulted for evaluation for admission for possible community-acquired pneumonia.  Review of Systems:  ROS  All other systems negative except as noted in the HPI.  Past Medical History:  Diagnosis Date   Alcohol abuse    Chronic low back pain    History of pulmonary embolus (PE)    Hodgkin's lymphoma (HCC)    Stenosis of cervical spine    Left   Tobacco use     Past Surgical History:  Procedure  Laterality Date   ANTERIOR CERVICAL DECOMP/DISCECTOMY FUSION N/A 10/20/2020   Procedure: ANTERIOR CERVICAL DECOMPRESSION/DISCECTOMY FUSION 1 LEVEL C5-C6;  Surgeon: Clois Fret, MD;  Location: [facility] ORS;  Service: Neurosurgery;  Laterality: N/A;     reports that he has been smoking cigarettes. He has a 10 pack-year smoking history. He has never used smokeless tobacco. He reports current alcohol use of about 3.0 standard drinks of alcohol per week. He reports that he does not currently use drugs.  No Known Allergies  Family History  Problem Relation Age of Onset   Heart failure Mother    Pneumonia Father    Alcohol abuse Brother     Prior to Admission medications   Medication Sig Start Date End Date Taking? Authorizing Provider  acetaminophen  (TYLENOL ) 500 MG tablet Take 1,000 mg by mouth every 6 (six) hours as needed for mild pain or moderate pain. Patient not taking: Reported on 02/19/2024    [provider]  apixaban  (ELIQUIS ) 5 MG TABS tablet Take 1 tablet (5 mg total) by mouth 2 (two) times daily. 02/11/24   Dorinda Drue DASEN, MD  apixaban  (ELIQUIS ) 5 MG TABS tablet Take 2 tablets (10 mg total) by mouth 2 (two) times daily for 7 days, then take 1 tablet (5 mg total) by mouth 2 (two) times daily Patient not taking: Reported on 02/19/2024 02/03/24 03/04/24  Dorinda Drue DASEN, MD  gabapentin  (NEURONTIN ) 300 MG capsule Take 1 capsule by mouth twice daily for 7 days  then increase to 3 times per day Patient not taking: Reported on 02/19/2024 03/07/23   Tonette Lauraine HERO, PA-C  oxyCODONE  (OXY IR/ROXICODONE ) 5 MG immediate release tablet Take 1 tablet (5 mg total) by mouth every 8 (eight) hours as needed for severe pain (pain score 7-10). 02/19/24   Babara Call, MD    Physical Exam: Vitals:   05/23/24 0430 05/23/24 0500 05/23/24 0553 05/23/24 0734  BP: 137/86 (!) 143/92 (!) 142/95 (!) 131/94  Pulse: 84 92 88 87  Resp:   18 17  Temp:   98.5 F (36.9 C) 98.4 F (36.9 C)  TempSrc:   Oral  Oral  SpO2: 100% 100% 100% 99%  Weight:      Height:        Physical Exam   Constitutional: Alert, awake, calm, comfortable HEENT: Neck supple Respiratory: Clear to auscultation B/L, no wheezing, no rales.  Cardiovascular: Regular rate and rhythm, no murmurs / rubs / gallops. No extremity edema. 2+ pedal pulses. No carotid bruits.  Abdomen: Soft, no tenderness, Bowel sounds positive.  Musculoskeletal: no clubbing / cyanosis. Good ROM, no contractures. Normal muscle tone.  Skin: no rashes, lesions, ulcers. Neurologic: CN 2-12 grossly intact. Sensation intact, No focal deficit identified Psychiatric: Alert and oriented x 3. Normal mood.    Labs on Admission: I have personally reviewed following labs and imaging studies  CBC: Recent Labs  Lab 05/23/24 0310  WBC 7.0  NEUTROABS 4.7  HGB 11.7*  HCT 33.3*  MCV 74.7*  PLT 311   Basic Metabolic Panel: Recent Labs  Lab 05/23/24 0310 05/23/24 0314  NA 141  --   K 3.6  --   CL 102  --   CO2 29  --   GLUCOSE 81  --   BUN 7  --   CREATININE 0.71  --   CALCIUM 9.1  --   MG  --  2.0   GFR: Estimated Creatinine Clearance: 113.4 mL/min (by C-G formula based on SCr of 0.71 mg/dL). Liver Function Tests: Recent Labs  Lab 05/23/24 0310  AST 26  ALT 17  ALKPHOS 42  BILITOT 0.6  PROT 8.1  ALBUMIN 4.0   No results for input(s): LIPASE, AMYLASE in the last 168 hours. No results for input(s): AMMONIA in the last 168 hours. Coagulation Profile: Recent Labs  Lab 05/23/24 0310  INR 0.9   Cardiac Enzymes: Recent Labs  Lab 05/23/24 0310  TROPONINIHS 6   BNP (last 3 results) No results for input(s): BNP in the last 8760 hours. HbA1C: No results for input(s): HGBA1C in the last 72 hours. CBG: No results for input(s): GLUCAP in the last 168 hours. Lipid Profile: No results for input(s): CHOL, HDL, LDLCALC, TRIG, CHOLHDL, LDLDIRECT in the last 72 hours. Thyroid Function Tests: No results for  input(s): TSH, T4TOTAL, FREET4, T3FREE, THYROIDAB in the last 72 hours. Anemia Panel: No results for input(s): VITAMINB12, FOLATE, FERRITIN, TIBC, IRON , RETICCTPCT in the last 72 hours. Urine analysis:    Component Value Date/Time   COLORURINE YELLOW (A) 02/02/2024 0336   APPEARANCEUR HAZY (A) 02/02/2024 0336   LABSPEC 1.019 02/02/2024 0336   PHURINE 5.0 02/02/2024 0336   GLUCOSEU NEGATIVE 02/02/2024 0336   HGBUR NEGATIVE 02/02/2024 0336   BILIRUBINUR NEGATIVE 02/02/2024 0336   KETONESUR 20 (A) 02/02/2024 0336   PROTEINUR 30 (A) 02/02/2024 0336   NITRITE NEGATIVE 02/02/2024 0336   LEUKOCYTESUR NEGATIVE 02/02/2024 0336    Radiological Exams on Admission: I have personally reviewed images DG  Chest 2 View Result Date: 05/23/2024 CLINICAL DATA:  Left-sided rib pain for 1 week EXAM: CHEST - 2 VIEW COMPARISON:  05/17/2024 FINDINGS: Cardiac shadow is within normal limits. Mediastinal fullness is noted related to known anterior mediastinal mass. Lungs are clear. No bony abnormality is noted. Left PICC is seen in satisfactory position. IMPRESSION: Changes consistent with the known mediastinal mass. No acute abnormality noted. Electronically Signed   By: Oneil Devonshire M.D.   On: 05/23/2024 03:37    EKG: My personal interpretation of EKG shows: Normal sinus rhythm    Assessment/Plan Principal Problem:   CAP (community acquired pneumonia) Active Problems:   Malignant lymphoma, Hodgkin's type (HCC)   Tobacco use   Port-A-Cath in place   Alcohol use    Assessment and Plan: 39 year old male with history of Hodgkin's lymphoma on remission for the last 1 year, tobacco use, alcohol use disorder who came into ED complaining of left-sided chest pain and rib pain.  1.  Community-acquired pneumonia - He does not have hypoxemia, there was concern for pulmonary hematoma, but appears more like pneumonia. - Will place him on observation - Patient received ceftriaxone and  azithromycin which will be continued as inpatient as well - Nebulizations - Pain medications - Lactic acid is slightly high we will continue to monitor but there is no tachycardia - Follow-up cultures - If he does not get better then we will need to look further for this rib pain and possible bleeding  2.  Hodgkin's lymphoma - In remission - Being followed by Dr. Babara - Follow-up as outpatient  3.  Smoking/alcohol use - Extensive counseling was done  4.  History of PE in June 2025 - Patient was not taking any Eliquis  due to noncompliance - Will resume this at this point - Needs counseling for him to continue but recent CT did not show any PE      DVT prophylaxis: Eliquis  Code Status: Full Code Family Communication: None  Disposition Plan: Home  Consults called: None  Admission status: Observation, Telemetry bed   Nena Rebel, MD [facility] 05/23/2024, 9:27 AM

## 2024-05-23 NOTE — ED Notes (Signed)
 Call placed to lab. They have received his specimens that were sent.

## 2024-05-24 ENCOUNTER — Telehealth (HOSPITAL_COMMUNITY): Payer: Self-pay | Admitting: Pharmacy Technician

## 2024-05-24 ENCOUNTER — Encounter: Payer: Self-pay | Admitting: Oncology

## 2024-05-24 ENCOUNTER — Other Ambulatory Visit (HOSPITAL_COMMUNITY): Payer: Self-pay

## 2024-05-24 DIAGNOSIS — J189 Pneumonia, unspecified organism: Secondary | ICD-10-CM | POA: Diagnosis not present

## 2024-05-24 MED ORDER — ALBUTEROL SULFATE (2.5 MG/3ML) 0.083% IN NEBU
2.5000 mg | INHALATION_SOLUTION | Freq: Two times a day (BID) | RESPIRATORY_TRACT | Status: DC
  Administered 2024-05-24 – 2024-05-25 (×2): 2.5 mg via RESPIRATORY_TRACT
  Filled 2024-05-24 (×2): qty 3

## 2024-05-24 NOTE — Telephone Encounter (Signed)
 Patient Product/process development scientist completed.    The patient is insured through [employer] MEDICAID.     Ran test claim for Eliquis  5 mg and the current 30 day co-pay is $4.00.  Ran test claim for Xarelto 20 mg and the current 30 day co-pay is $4.00.  This test claim was processed through [facility]- copay amounts may vary at other pharmacies due to pharmacy/plan contracts, or as the patient moves through the different stages of their insurance plan.     Reyes Sharps, CPHT Pharmacy Technician Patient Advocate Specialist [facility] Patient Advocate Team Direct Number: [phone]  Fax: [phone]

## 2024-05-24 NOTE — Progress Notes (Signed)
 Progress Note    Preston [name]  FMW:[id number] DOB: Feb 03, 1986  DOA: 05/23/2024 PCP: Patient, No Pcp Per      Brief Narrative:    Medical records reviewed and are as summarized below:  Preston [name] is a 38 y.o. male with medical history significant for Hodgkin's lymphoma in remission (follows with Dr. Babara, oncologist), history of pulmonary embolism in June 2025 (defaulted treatment with Eliquis  after 1 month of treatment), chronic low back pain, spinal stenosis, tobacco use disorder, alcohol use disorder, who presented to the hospital with cough, congestion, severe left-sided chest and rib pain that is worse with coughing and breathing.  CTA chest done on 05/17/2024 did not show any pulmonary embolism but there were new patchy airspace opacities with findings concerning for early multifocal atypical pneumonia, pulmonary hemorrhage or acute hypersensitivity pneumonitis.  Chest x-ray on 05/23/2024, on day of admission, show changes consistent with known mediastinal mass but no acute abnormality was noted.       Assessment/Plan:   Principal Problem:   CAP (community acquired pneumonia) Active Problems:   Malignant lymphoma, Hodgkin's type (HCC)   Tobacco use   Port-A-Cath in place   Alcohol use   Body mass index is 20.66 kg/m.   Community-acquired pneumonia with severe left-sided chest and rib pain: Continue IV ceftriaxone and azithromycin.  Analgesics as needed for pain. Lactic acid was 2.8.  Unknown if this is due to severe sepsis.   History of pulmonary embolism diagnosed by CTA chest on 02/02/2024: He said he only took Eliquis  for 1 month and did not refill Eliquis  after running out of Eliquis .  He is agreeable to resume treatment.  Outpatient follow-up with Dr. Babara, hematologist/oncologist, recommended.   Hodgkin's lymphoma: S/p treatment.  Now in remission.  PET scan on 03/01/2024 did not show specific areas of abnormal radiotracer uptake.  There was  evidence of stable anterior mediastinal heterogeneous lesion with calcifications.   Alcohol and tobacco use disorder: Counseled to quit.   Diet Order             Diet Heart Room service appropriate? Yes; Fluid consistency: Thin  Diet effective now                                  Consultants: None  Procedures: None    Medications:    albuterol  2.5 mg Nebulization BID   apixaban   5 mg Oral BID   gabapentin   300 mg Oral TID   guaiFENesin  600 mg Oral BID   Continuous Infusions:  azithromycin 500 mg (05/24/24 0529)   cefTRIAXone (ROCEPHIN)  IV 2 g (05/24/24 0425)     Anti-infectives (From admission, onward)    Start     Dose/Rate Route Frequency Ordered Stop   05/24/24 0600  azithromycin (ZITHROMAX) 500 mg in sodium chloride  0.9 % 250 mL IVPB        500 mg 250 mL/hr over 60 Minutes Intravenous Every 24 hours 05/23/24 0605 05/28/24 0559   05/23/24 0515  cefTRIAXone (ROCEPHIN) 2 g in sodium chloride  0.9 % 100 mL IVPB        2 g 200 mL/hr over 30 Minutes Intravenous Every 24 hours 05/23/24 0506 05/28/24 0514   05/23/24 0515  azithromycin (ZITHROMAX) 500 mg in sodium chloride  0.9 % 250 mL IVPB  Status:  Discontinued        500 mg 250 mL/hr over 60 Minutes Intravenous  Every 24 hours 05/23/24 0506 05/23/24 0605   05/23/24 0500  cefTRIAXone (ROCEPHIN) 2 g in sodium chloride  0.9 % 100 mL IVPB  Status:  Discontinued        2 g 200 mL/hr over 30 Minutes Intravenous  Once 05/23/24 0452 05/23/24 0507   05/23/24 0500  azithromycin (ZITHROMAX) 500 mg in sodium chloride  0.9 % 250 mL IVPB  Status:  Discontinued        500 mg 250 mL/hr over 60 Minutes Intravenous  Once 05/23/24 0452 05/23/24 0507              Family Communication/Anticipated D/C date and plan/Code Status   DVT prophylaxis:  apixaban  (ELIQUIS ) tablet 5 mg     Code Status: Full Code  Family Communication: None Disposition Plan: Plan to discharge home   Status is:  Observation The patient will require care spanning > 2 midnights and should be moved to inpatient because: Pneumonia, inadequate pain control       Subjective:   Interval events noted.  He complains of cough productive of whitish sputum and severe left-sided pleuritic chest and rib pain.  Pain is worse with coughing and breathing.  No hemoptysis, wheezing or shortness of breath.  Objective:    Vitals:   05/23/24 2024 05/24/24 0311 05/24/24 0740 05/24/24 0833  BP:  (!) 141/125  (!) 137/90  Pulse:  83  70  Resp:  16  18  Temp:  98 F (36.7 C)  98.1 F (36.7 C)  TempSrc:    Oral  SpO2: 97% 100% 99% 99%  Weight:      Height:       No data found.   Intake/Output Summary (Last 24 hours) at 05/24/2024 1006 Last data filed at 05/24/2024 0644 Gross per 24 hour  Intake 800 ml  Output 1550 ml  Net -750 ml   Filed Weights   05/23/24 0250  Weight: 65.3 kg    Exam:  GEN: NAD SKIN: Warm and dry EYES: No pallor or icterus ENT: MMM CV: RRR PULM: CTA B ABD: soft, ND, NT, +BS CNS: AAO x 3, non focal EXT: No edema or tenderness MSK: Left-sided lateral chest wall tenderness       Data Reviewed:   I have personally reviewed following labs and imaging studies:  Labs: Labs show the following:   Basic Metabolic Panel: Recent Labs  Lab 05/23/24 0310 05/23/24 0314  NA 141  --   K 3.6  --   CL 102  --   CO2 29  --   GLUCOSE 81  --   BUN 7  --   CREATININE 0.71  --   CALCIUM 9.1  --   MG  --  2.0   GFR Estimated Creatinine Clearance: 113.4 mL/min (by C-G formula based on SCr of 0.71 mg/dL). Liver Function Tests: Recent Labs  Lab 05/23/24 0310  AST 26  ALT 17  ALKPHOS 42  BILITOT 0.6  PROT 8.1  ALBUMIN 4.0   No results for input(s): LIPASE, AMYLASE in the last 168 hours. No results for input(s): AMMONIA in the last 168 hours. Coagulation profile Recent Labs  Lab 05/23/24 0310  INR 0.9    CBC: Recent Labs  Lab 05/23/24 0310  WBC 7.0   NEUTROABS 4.7  HGB 11.7*  HCT 33.3*  MCV 74.7*  PLT 311   Cardiac Enzymes: No results for input(s): CKTOTAL, CKMB, CKMBINDEX, TROPONINI in the last 168 hours. BNP (last 3 results) No results for input(s):  PROBNP in the last 8760 hours. CBG: No results for input(s): GLUCAP in the last 168 hours. D-Dimer: No results for input(s): DDIMER in the last 72 hours. Hgb A1c: No results for input(s): HGBA1C in the last 72 hours. Lipid Profile: No results for input(s): CHOL, HDL, LDLCALC, TRIG, CHOLHDL, LDLDIRECT in the last 72 hours. Thyroid function studies: No results for input(s): TSH, T4TOTAL, T3FREE, THYROIDAB in the last 72 hours.  Invalid input(s): FREET3 Anemia work up: No results for input(s): VITAMINB12, FOLATE, FERRITIN, TIBC, IRON , RETICCTPCT in the last 72 hours. Sepsis Labs: Recent Labs  Lab 05/23/24 0310 05/23/24 0505 05/23/24 1014  PROCALCITON <0.10  --  <0.10  WBC 7.0  --   --   LATICACIDVEN 1.8 2.8*  --     Microbiology Recent Results (from the past 240 hours)  Resp panel by RT-PCR (RSV, Flu A&B, Covid) Anterior Nasal Swab     Status: None   Collection Time: 05/23/24  3:10 AM   Specimen: Anterior Nasal Swab  Result Value Ref Range Status   SARS Coronavirus 2 by RT PCR NEGATIVE NEGATIVE Final    Comment: (NOTE) SARS-CoV-2 target nucleic acids are NOT DETECTED.  The SARS-CoV-2 RNA is generally detectable in upper respiratory specimens during the acute phase of infection. The lowest concentration of SARS-CoV-2 viral copies this assay can detect is 138 copies/mL. A negative result does not preclude SARS-Cov-2 infection and should not be used as the sole basis for treatment or other patient management decisions. A negative result may occur with  improper specimen collection/handling, submission of specimen other than nasopharyngeal swab, presence of viral mutation(s) within the areas targeted by this assay, and  inadequate number of viral copies(<138 copies/mL). A negative result must be combined with clinical observations, patient history, and epidemiological information. The expected result is Negative.  Fact Sheet for Patients:  BloggerCourse.com  Fact Sheet for Healthcare Providers:  SeriousBroker.it  This test is no t yet approved or cleared by the [place]  FDA and  has been authorized for detection and/or diagnosis of SARS-CoV-2 by FDA under an Emergency Use Authorization (EUA). This EUA will remain  in effect (meaning this test can be used) for the duration of the COVID-19 declaration under Section 564(b)(1) of the Act, 21 U.S.C.section 360bbb-3(b)(1), unless the authorization is terminated  or revoked sooner.       Influenza A by PCR NEGATIVE NEGATIVE Final   Influenza B by PCR NEGATIVE NEGATIVE Final    Comment: (NOTE) The Xpert Xpress SARS-CoV-2/FLU/RSV plus assay is intended as an aid in the diagnosis of influenza from Nasopharyngeal swab specimens and should not be used as a sole basis for treatment. Nasal washings and aspirates are unacceptable for Xpert Xpress SARS-CoV-2/FLU/RSV testing.  Fact Sheet for Patients: BloggerCourse.com  Fact Sheet for Healthcare Providers: SeriousBroker.it  This test is not yet approved or cleared by the [place]  FDA and has been authorized for detection and/or diagnosis of SARS-CoV-2 by FDA under an Emergency Use Authorization (EUA). This EUA will remain in effect (meaning this test can be used) for the duration of the COVID-19 declaration under Section 564(b)(1) of the Act, 21 U.S.C. section 360bbb-3(b)(1), unless the authorization is terminated or revoked.     Resp Syncytial Virus by PCR NEGATIVE NEGATIVE Final    Comment: (NOTE) Fact Sheet for Patients: BloggerCourse.com  Fact Sheet for Healthcare  Providers: SeriousBroker.it  This test is not yet approved or cleared by the [place]  FDA and has been authorized for detection and/or  diagnosis of SARS-CoV-2 by FDA under an Emergency Use Authorization (EUA). This EUA will remain in effect (meaning this test can be used) for the duration of the COVID-19 declaration under Section 564(b)(1) of the Act, 21 U.S.C. section 360bbb-3(b)(1), unless the authorization is terminated or revoked.  Performed at [facility], [address] [place], [place] [address]   Blood culture (single)     Status: None (Preliminary result)   Collection Time: 05/23/24  3:10 AM   Specimen: BLOOD  Result Value Ref Range Status   Specimen Description BLOOD RIGHT ANTECUBITAL  Final   Special Requests   Final    BOTTLES DRAWN AEROBIC AND ANAEROBIC Blood Culture adequate volume   Culture   Final    NO GROWTH 1 DAY Performed at [facility], [address], [place], [place] [address]    Report Status PENDING  Incomplete  Blood culture (single)     Status: None (Preliminary result)   Collection Time: 05/23/24  5:05 AM   Specimen: BLOOD  Result Value Ref Range Status   Specimen Description BLOOD BLOOD LEFT ARM  Final   Special Requests   Final    BOTTLES DRAWN AEROBIC AND ANAEROBIC Blood Culture results may not be optimal due to an inadequate volume of blood received in culture bottles   Culture   Final    NO GROWTH 1 DAY Performed at [facility], [address] [place], [place] [address]    Report Status PENDING  Incomplete    Procedures and diagnostic studies:  DG Chest 2 View Result Date: 05/23/2024 CLINICAL DATA:  Left-sided rib pain for 1 week EXAM: CHEST - 2 VIEW COMPARISON:  05/17/2024 FINDINGS: Cardiac shadow is within normal limits. Mediastinal fullness is noted related to known anterior mediastinal mass. Lungs are clear. No bony abnormality is noted. Left PICC is seen in satisfactory  position. IMPRESSION: Changes consistent with the known mediastinal mass. No acute abnormality noted. Electronically Signed   By: Preston [name] M.D.   On: 05/23/2024 03:37               LOS: 0 days   Preston [name]  [facility]   Pager on [email or web address]. If 7PM-7AM, please contact night-coverage at [email or web address]     05/24/2024, 10:06 AM

## 2024-05-24 NOTE — TOC CM/SW Note (Signed)
 Transition of Care [facility]) - Inpatient Brief Assessment   Patient Details  Name: Preston [name] MRN: [id number] Date of Birth: October 15, 1985  Transition of Care [facility]) CM/SW Contact:    Corean ONEIDA Haddock, RN Phone Number: 05/24/2024, 1:40 PM   Clinical Narrative:   Transition of Care (TOC) Screening Note   Patient Details  Name: Preston [name] Date of Birth: 30-Nov-1985   Transition of Care [facility]) CM/SW Contact:    Corean ONEIDA Haddock, RN Phone Number: 05/24/2024, 1:40 PM    Transition of Care Department [facility]) has reviewed patient and no TOC needs have been identified at this time. . If new patient transition needs arise, please place a TOC consult.    Transition of Care Asessment: Insurance and Status: Insurance coverage has been reviewed Patient has primary care physician: No (list of local PCP added to AVS)     Prior/Current Home Services: No current home services Social Drivers of Health Review: SDOH reviewed interventions complete Readmission risk has been reviewed: No (obs status.  no score generated) Transition of care needs: no transition of care needs at this time

## 2024-05-24 NOTE — Plan of Care (Signed)
  Problem: Clinical Measurements: Goal: Ability to maintain clinical measurements within normal limits will improve Outcome: Progressing   Problem: Clinical Measurements: Goal: Diagnostic test results will improve Outcome: Progressing   Problem: Clinical Measurements: Goal: Respiratory complications will improve Outcome: Progressing   Problem: Clinical Measurements: Goal: Cardiovascular complication will be avoided Outcome: Progressing

## 2024-05-24 NOTE — Plan of Care (Signed)
  Problem: Education: Goal: Knowledge of General Education information will improve Description: Including pain rating scale, medication(s)/side effects and non-pharmacologic comfort measures Outcome: Progressing   Problem: Clinical Measurements: Goal: Ability to maintain clinical measurements within normal limits will improve Outcome: Progressing Goal: Diagnostic test results will improve Outcome: Progressing   Problem: Nutrition: Goal: Adequate nutrition will be maintained Outcome: Progressing   Problem: Coping: Goal: Level of anxiety will decrease Outcome: Progressing   Problem: Pain Managment: Goal: General experience of comfort will improve and/or be controlled Outcome: Progressing   Problem: Safety: Goal: Ability to remain free from injury will improve Outcome: Progressing

## 2024-05-24 NOTE — Discharge Instructions (Signed)

## 2024-05-25 ENCOUNTER — Other Ambulatory Visit: Payer: Self-pay

## 2024-05-25 DIAGNOSIS — J189 Pneumonia, unspecified organism: Secondary | ICD-10-CM | POA: Diagnosis not present

## 2024-05-25 LAB — CBC
HCT: 34 % — ABNORMAL LOW (ref 39.0–52.0)
Hemoglobin: 12.1 g/dL — ABNORMAL LOW (ref 13.0–17.0)
MCH: 26.4 pg (ref 26.0–34.0)
MCHC: 35.6 g/dL (ref 30.0–36.0)
MCV: 74.1 fL — ABNORMAL LOW (ref 80.0–100.0)
Platelets: 305 K/uL (ref 150–400)
RBC: 4.59 MIL/uL (ref 4.22–5.81)
RDW: 17.9 % — ABNORMAL HIGH (ref 11.5–15.5)
WBC: 5.6 K/uL (ref 4.0–10.5)
nRBC: 0.4 % — ABNORMAL HIGH (ref 0.0–0.2)

## 2024-05-25 LAB — RESPIRATORY PANEL BY PCR

## 2024-05-25 LAB — LACTIC ACID, PLASMA: Lactic Acid, Venous: 1.9 mmol/L (ref 0.5–1.9)

## 2024-05-25 LAB — LEGIONELLA PNEUMOPHILA SEROGP 1 UR AG: L. pneumophila Serogp 1 Ur Ag: NEGATIVE

## 2024-05-25 MED ORDER — HYDROCODONE-ACETAMINOPHEN 5-325 MG PO TABS
1.0000 | ORAL_TABLET | Freq: Three times a day (TID) | ORAL | 0 refills | Status: AC | PRN
  Filled 2024-05-25: qty 6, 2d supply, fill #0

## 2024-05-25 MED ORDER — GUAIFENESIN-DM 100-10 MG/5ML PO SYRP: 10.0000 mL | ORAL_SOLUTION | Freq: Three times a day (TID) | ORAL | Status: AC | PRN

## 2024-05-25 MED ORDER — APIXABAN 5 MG PO TABS
5.0000 mg | ORAL_TABLET | Freq: Two times a day (BID) | ORAL | 0 refills | Status: DC
  Filled 2024-05-25: qty 60, 30d supply, fill #0

## 2024-05-25 MED ORDER — HYDROCODONE-ACETAMINOPHEN 5-325 MG PO TABS
1.0000 | ORAL_TABLET | ORAL | Status: DC | PRN
  Administered 2024-05-25 (×2): 2 via ORAL
  Filled 2024-05-25 (×2): qty 2

## 2024-05-25 MED ORDER — GABAPENTIN 300 MG PO CAPS: 300.0000 mg | ORAL_CAPSULE | Freq: Three times a day (TID) | ORAL | Status: DC

## 2024-05-25 MED ORDER — AMOXICILLIN-POT CLAVULANATE 875-125 MG PO TABS
1.0000 | ORAL_TABLET | Freq: Two times a day (BID) | ORAL | 0 refills | Status: AC
  Filled 2024-05-25: qty 4, 2d supply, fill #0

## 2024-05-25 NOTE — Discharge Summary (Signed)
 Physician Discharge Summary   Patient: Preston [name] MRN: [id number] DOB: [birth year]  Admit date:     05/23/2024  Discharge date: 05/25/24  Discharge Physician: AIDA CHO   PCP: Patient, No Pcp Per   Recommendations at discharge:   Follow-up with PCP in 1 to 2 weeks Obtain Eliquis  refills PCP or oncologist and avoid interruption in treatment  Discharge Diagnoses: Principal Problem:   CAP (community acquired pneumonia) Active Problems:   Malignant lymphoma, Hodgkin's type (HCC)   Tobacco use   Port-A-Cath in place   Alcohol use  Resolved Problems:   * No resolved hospital problems. *  Hospital Course:   Preston [name] is a 38 y.o. male with medical history significant for Hodgkin's lymphoma in remission (follows with Dr. Babara, oncologist), history of pulmonary embolism in June 2025 (defaulted treatment with Eliquis  after 1 month of treatment), chronic low back pain, spinal stenosis, tobacco use disorder, alcohol use disorder, who presented to the hospital with cough, congestion, severe left-sided chest and rib pain that is worse with coughing and breathing.   CTA chest done on 05/17/2024 did not show any pulmonary embolism but there were new patchy airspace opacities with findings concerning for early multifocal atypical pneumonia, pulmonary hemorrhage or acute hypersensitivity pneumonitis.   Chest x-ray on 05/23/2024, on day of admission, show changes consistent with known mediastinal mass but no acute abnormality was noted.   Assessment and Plan:   Community-acquired pneumonia with severe left-sided chest and rib pain: S/p treatment with IV ceftriaxone and azithromycin for 3 days.  He will be discharged on Augmentin for 2 more days to complete 5 days of treatment. Lactic acid was 2.8.  Improved to 1.9 prior to discharge.  Unknown if this is due to severe sepsis.     History of pulmonary embolism diagnosed by CTA chest on 02/02/2024: He said he only took Eliquis   for 1 month and did not refill Eliquis  after running out of Eliquis .  He is agreeable to resume treatment.  Eliquis  prescription provided at discharge.   Outpatient follow-up with Dr. Babara, hematologist/oncologist, recommended.    Hodgkin's lymphoma: S/p treatment.  Now in remission.  PET scan on 03/01/2024 did not show specific areas of abnormal radiotracer uptake.  There was evidence of stable anterior mediastinal heterogeneous lesion with calcifications.     Alcohol and tobacco use disorder: Counseled to quit.    His condition has improved and he is deemed stable for discharge home today.     Pain control - [place]  Controlled Substance Reporting System database was reviewed. and patient was instructed, not to drive, operate heavy machinery, perform activities at heights, swimming or participation in water activities or provide baby-sitting services while on Pain, Sleep and Anxiety Medications; until their outpatient Physician has advised to do so again. Also recommended to not to take more than prescribed Pain, Sleep and Anxiety Medications.  Consultants: None Procedures performed: None Disposition: Home Diet recommendation:  Discharge Diet Orders (From admission, onward)     Start     Ordered   05/25/24 0000  Diet - low sodium heart healthy        05/25/24 1018           Cardiac diet DISCHARGE MEDICATION: Allergies as of 05/25/2024   No Known Allergies      Medication List     STOP taking these medications    oxyCODONE  5 MG immediate release tablet Commonly known as: Oxy IR/ROXICODONE        TAKE these  medications    acetaminophen  500 MG tablet Commonly known as: TYLENOL  Take 1,000 mg by mouth every 6 (six) hours as needed for mild pain (pain score 1-3) or moderate pain (pain score 4-6).   amoxicillin-clavulanate 875-125 MG tablet Commonly known as: AUGMENTIN Take 1 tablet by mouth 2 (two) times daily for 2 days. Start taking on: May 26, 2024    Eliquis  5 MG Tabs tablet Generic drug: apixaban  Take 1 tablet (5 mg total) by mouth 2 (two) times daily. What changed: Another medication with the same name was removed. Continue taking this medication, and follow the directions you see here.   gabapentin  300 MG capsule Commonly known as: Neurontin  Take 1 capsule (300 mg total) by mouth 3 (three) times daily. Take 1 capsule by mouth twice daily for 7 days then increase to 3 times per day What changed:  how much to take how to take this when to take this   guaiFENesin-dextromethorphan 100-10 MG/5ML syrup Commonly known as: ROBITUSSIN DM Take 10 mLs by mouth every 8 (eight) hours as needed for up to 3 days for cough.   HYDROcodone -acetaminophen  5-325 MG tablet Commonly known as: NORCO/VICODIN Take 1 tablet by mouth every 8 (eight) hours as needed for up to 2 days.        Discharge Exam: Filed Weights   05/23/24 0250  Weight: 65.3 kg   GEN: NAD SKIN: Warm and dry EYES: No pallor or icterus ENT: MMM CV: RRR PULM: CTA B ABD: soft, ND, NT, +BS CNS: AAO x 3, non focal EXT: No edema or tenderness MSK: Left-sided chest wall tenderness improving    Condition at discharge: good  The results of significant diagnostics from this hospitalization (including imaging, microbiology, ancillary and laboratory) are listed below for reference.   Imaging Studies: DG Chest 2 View Result Date: 05/23/2024 CLINICAL DATA:  Left-sided rib pain for 1 week EXAM: CHEST - 2 VIEW COMPARISON:  05/17/2024 FINDINGS: Cardiac shadow is within normal limits. Mediastinal fullness is noted related to known anterior mediastinal mass. Lungs are clear. No bony abnormality is noted. Left PICC is seen in satisfactory position. IMPRESSION: Changes consistent with the known mediastinal mass. No acute abnormality noted. Electronically Signed   By: Oneil Devonshire M.D.   On: 05/23/2024 03:37   CT Angio Chest Pulmonary Embolism (PE) W or WO Contrast Result Date:  05/17/2024 CLINICAL DATA:  Hodgkin's lymphoma. Iron  deficiency anemia. History of acute pulmonary embolus on 02/02/2024. * Tracking Code: BO * EXAM: CT ANGIOGRAPHY CHEST WITH CONTRAST TECHNIQUE: Multidetector CT imaging of the chest was performed using the standard protocol during bolus administration of intravenous contrast. Multiplanar CT image reconstructions and MIPs were obtained to evaluate the vascular anatomy. RADIATION DOSE REDUCTION: This exam was performed according to the departmental dose-optimization program which includes automated exposure control, adjustment of the mA and/or kV according to patient size and/or use of iterative reconstruction technique. CONTRAST:  75mL OMNIPAQUE  IOHEXOL  350 MG/ML SOLN COMPARISON:  02/02/2024 FINDINGS: Cardiovascular: No filling defect is identified in the pulmonary arterial tree to suggest pulmonary embolus. Left anterior descending coronary artery atherosclerosis. Mediastinum/Nodes: 7.1 by 3.6 cm stable anterior mediastinal mass on image 68 series 10 with stable prevascular lymph nodes. Lungs/Pleura: New patchy airspace opacity in the posterior basal segment right lower lobe. New multifocal confluent secondary pulmonary lobular ground-glass opacities in the left upper lobe. Possibilities may include early multifocal atypical pneumonia, pulmonary hemorrhage, or acute hypersensitivity pneumonitis. Upper Abdomen: Unremarkable Musculoskeletal: Unremarkable Review of the MIP images confirms  the above findings. IMPRESSION: 1. No filling defect is identified in the pulmonary arterial tree to suggest pulmonary embolus. 2. New patchy airspace opacity in the posterior basal segment right lower lobe. New multifocal confluent secondary pulmonary lobular ground-glass opacities in the left upper lobe. Possibilities may include early multifocal atypical pneumonia, pulmonary hemorrhage, or acute hypersensitivity pneumonitis. 3. Stable anterior mediastinal mass and prevascular lymph  nodes. 4. Left anterior descending coronary artery atherosclerosis. Electronically Signed   By: Ryan Salvage M.D.   On: 05/17/2024 16:44    Microbiology: Results for orders placed or performed during the hospital encounter of 05/23/24  Resp panel by RT-PCR (RSV, Flu A&B, Covid) Anterior Nasal Swab     Status: None   Collection Time: 05/23/24  3:10 AM   Specimen: Anterior Nasal Swab  Result Value Ref Range Status   SARS Coronavirus 2 by RT PCR NEGATIVE NEGATIVE Final    Comment: (NOTE) SARS-CoV-2 target nucleic acids are NOT DETECTED.  The SARS-CoV-2 RNA is generally detectable in upper respiratory specimens during the acute phase of infection. The lowest concentration of SARS-CoV-2 viral copies this assay can detect is 138 copies/mL. A negative result does not preclude SARS-Cov-2 infection and should not be used as the sole basis for treatment or other patient management decisions. A negative result may occur with  improper specimen collection/handling, submission of specimen other than nasopharyngeal swab, presence of viral mutation(s) within the areas targeted by this assay, and inadequate number of viral copies(<138 copies/mL). A negative result must be combined with clinical observations, patient history, and epidemiological information. The expected result is Negative.  Fact Sheet for Patients:  BloggerCourse.com  Fact Sheet for Healthcare Providers:  SeriousBroker.it  This test is no t yet approved or cleared by the [place]  FDA and  has been authorized for detection and/or diagnosis of SARS-CoV-2 by FDA under an Emergency Use Authorization (EUA). This EUA will remain  in effect (meaning this test can be used) for the duration of the COVID-19 declaration under Section 564(b)(1) of the Act, 21 U.S.C.section 360bbb-3(b)(1), unless the authorization is terminated  or revoked sooner.       Influenza A by PCR  NEGATIVE NEGATIVE Final   Influenza B by PCR NEGATIVE NEGATIVE Final    Comment: (NOTE) The Xpert Xpress SARS-CoV-2/FLU/RSV plus assay is intended as an aid in the diagnosis of influenza from Nasopharyngeal swab specimens and should not be used as a sole basis for treatment. Nasal washings and aspirates are unacceptable for Xpert Xpress SARS-CoV-2/FLU/RSV testing.  Fact Sheet for Patients: BloggerCourse.com  Fact Sheet for Healthcare Providers: SeriousBroker.it  This test is not yet approved or cleared by the [place]  FDA and has been authorized for detection and/or diagnosis of SARS-CoV-2 by FDA under an Emergency Use Authorization (EUA). This EUA will remain in effect (meaning this test can be used) for the duration of the COVID-19 declaration under Section 564(b)(1) of the Act, 21 U.S.C. section 360bbb-3(b)(1), unless the authorization is terminated or revoked.     Resp Syncytial Virus by PCR NEGATIVE NEGATIVE Final    Comment: (NOTE) Fact Sheet for Patients: BloggerCourse.com  Fact Sheet for Healthcare Providers: SeriousBroker.it  This test is not yet approved or cleared by the [place]  FDA and has been authorized for detection and/or diagnosis of SARS-CoV-2 by FDA under an Emergency Use Authorization (EUA). This EUA will remain in effect (meaning this test can be used) for the duration of the COVID-19 declaration under Section 564(b)(1) of the Act, 21  U.S.C. section 360bbb-3(b)(1), unless the authorization is terminated or revoked.  Performed at [facility], [address] [place], [place] [address]   Blood culture (single)     Status: None (Preliminary result)   Collection Time: 05/23/24  3:10 AM   Specimen: BLOOD  Result Value Ref Range Status   Specimen Description BLOOD RIGHT ANTECUBITAL  Final   Special Requests   Final    BOTTLES DRAWN  AEROBIC AND ANAEROBIC Blood Culture adequate volume   Culture   Final    NO GROWTH 2 DAYS Performed at [facility], [address] [place], [place] [address]    Report Status PENDING  Incomplete  Blood culture (single)     Status: None (Preliminary result)   Collection Time: 05/23/24  5:05 AM   Specimen: BLOOD  Result Value Ref Range Status   Specimen Description BLOOD BLOOD LEFT ARM  Final   Special Requests   Final    BOTTLES DRAWN AEROBIC AND ANAEROBIC Blood Culture results may not be optimal due to an inadequate volume of blood received in culture bottles   Culture   Final    NO GROWTH 2 DAYS Performed at [facility], [address] [place], [place] [address]    Report Status PENDING  Incomplete  Respiratory (~20 pathogens) panel by PCR     Status: None   Collection Time: 05/24/24 10:27 PM   Specimen: Nasopharyngeal Swab; Respiratory  Result Value Ref Range Status   Adenovirus NOT DETECTED NOT DETECTED Final   Coronavirus 229E NOT DETECTED NOT DETECTED Final    Comment: (NOTE) The Coronavirus on the Respiratory Panel, DOES NOT test for the novel  Coronavirus (2019 nCoV)    Coronavirus HKU1 NOT DETECTED NOT DETECTED Final   Coronavirus NL63 NOT DETECTED NOT DETECTED Final   Coronavirus OC43 NOT DETECTED NOT DETECTED Final   Metapneumovirus NOT DETECTED NOT DETECTED Final   Rhinovirus / Enterovirus NOT DETECTED NOT DETECTED Final   Influenza A NOT DETECTED NOT DETECTED Final   Influenza B NOT DETECTED NOT DETECTED Final   Parainfluenza Virus 1 NOT DETECTED NOT DETECTED Final   Parainfluenza Virus 2 NOT DETECTED NOT DETECTED Final   Parainfluenza Virus 3 NOT DETECTED NOT DETECTED Final   Parainfluenza Virus 4 NOT DETECTED NOT DETECTED Final   Respiratory Syncytial Virus NOT DETECTED NOT DETECTED Final   Bordetella pertussis NOT DETECTED NOT DETECTED Final   Bordetella Parapertussis NOT DETECTED NOT DETECTED Final   Chlamydophila pneumoniae NOT  DETECTED NOT DETECTED Final   Mycoplasma pneumoniae NOT DETECTED NOT DETECTED Final    Comment: Performed at [facility], [address] [place], [place] [address]    Labs: CBC: Recent Labs  Lab 05/23/24 0310 05/25/24 0405  WBC 7.0 5.6  NEUTROABS 4.7  --   HGB 11.7* 12.1*  HCT 33.3* 34.0*  MCV 74.7* 74.1*  PLT 311 305   Basic Metabolic Panel: Recent Labs  Lab 05/23/24 0310 05/23/24 0314  NA 141  --   K 3.6  --   CL 102  --   CO2 29  --   GLUCOSE 81  --   BUN 7  --   CREATININE 0.71  --   CALCIUM 9.1  --   MG  --  2.0   Liver Function Tests: Recent Labs  Lab 05/23/24 0310  AST 26  ALT 17  ALKPHOS 42  BILITOT 0.6  PROT 8.1  ALBUMIN 4.0   CBG: No results  for input(s): GLUCAP in the last 168 hours.  Discharge time spent: greater than 30 minutes.  Signed: AIDA CHO, MD [facility] 05/25/2024

## 2024-05-25 NOTE — Plan of Care (Signed)

## 2024-05-25 NOTE — Plan of Care (Signed)

## 2024-05-28 LAB — CULTURE, BLOOD (SINGLE)
Culture: NO GROWTH
Culture: NO GROWTH
Special Requests: ADEQUATE

## 2024-06-17 ENCOUNTER — Other Ambulatory Visit: Payer: Self-pay

## 2024-06-17 ENCOUNTER — Emergency Department
Admission: EM | Admit: 2024-06-17 | Discharge: 2024-06-18 | Disposition: A | Discharge status: Home / Self Care | Attending: Emergency Medicine | Admitting: Emergency Medicine

## 2024-06-17 ENCOUNTER — Emergency Department

## 2024-06-17 DIAGNOSIS — I2699 Other pulmonary embolism without acute cor pulmonale: Secondary | ICD-10-CM | POA: Diagnosis not present

## 2024-06-17 DIAGNOSIS — G8929 Other chronic pain: Secondary | ICD-10-CM | POA: Diagnosis not present

## 2024-06-17 DIAGNOSIS — M545 Low back pain, unspecified: Secondary | ICD-10-CM | POA: Diagnosis not present

## 2024-06-17 DIAGNOSIS — Z8571 Personal history of Hodgkin lymphoma: Secondary | ICD-10-CM | POA: Diagnosis not present

## 2024-06-17 DIAGNOSIS — Z7901 Long term (current) use of anticoagulants: Secondary | ICD-10-CM | POA: Insufficient documentation

## 2024-06-17 DIAGNOSIS — R059 Cough, unspecified: Secondary | ICD-10-CM | POA: Diagnosis not present

## 2024-06-17 DIAGNOSIS — R0789 Other chest pain: Secondary | ICD-10-CM | POA: Diagnosis present

## 2024-06-17 DIAGNOSIS — R079 Chest pain, unspecified: Secondary | ICD-10-CM

## 2024-06-17 LAB — CBC
HCT: 34.5 % — ABNORMAL LOW (ref 39.0–52.0)
Hemoglobin: 11.8 g/dL — ABNORMAL LOW (ref 13.0–17.0)
MCH: 25.9 pg — ABNORMAL LOW (ref 26.0–34.0)
MCHC: 34.2 g/dL (ref 30.0–36.0)
MCV: 75.7 fL — ABNORMAL LOW (ref 80.0–100.0)
Platelets: 270 K/uL (ref 150–400)
RBC: 4.56 MIL/uL (ref 4.22–5.81)
RDW: 17.5 % — ABNORMAL HIGH (ref 11.5–15.5)
WBC: 5.5 K/uL (ref 4.0–10.5)
nRBC: 0 % (ref 0.0–0.2)

## 2024-06-17 LAB — BASIC METABOLIC PANEL WITH GFR
Anion gap: 14 (ref 5–15)
BUN: 9 mg/dL (ref 6–20)
CO2: 22 mmol/L (ref 22–32)
Calcium: 9.1 mg/dL (ref 8.9–10.3)
Chloride: 102 mmol/L (ref 98–111)
Creatinine, Ser: 0.65 mg/dL (ref 0.61–1.24)
GFR, Estimated: >60 mL/min (ref >60)
Glucose, Bld: 74 mg/dL (ref 70–99)
Potassium: 3.2 mmol/L — ABNORMAL LOW (ref 3.5–5.1)
Sodium: 138 mmol/L (ref 135–145)

## 2024-06-17 LAB — TROPONIN I (HIGH SENSITIVITY)
Troponin I (High Sensitivity): 5 ng/L (ref <18)
Troponin I (High Sensitivity): 4 ng/L (ref <18)

## 2024-06-17 NOTE — ED Provider Notes (Signed)
 [facility] Provider Note    Event Date/Time   First MD Initiated Contact with Patient 06/17/24 2315     (approximate)   History   Chest Pain   HPI  Thimothy Barretta is a 38 y.o. male with history of Hodgkin's lymphoma in remission, history of PE on Eliquis , chronic back pain, spinal stenosis, alcohol use, presenting with chest pain and back pain.  States that he was admitted in October for pneumonia.  Completed antibiotics.  On independent chart review, he was admitted in October for pneumonia, he had presented time with left-sided chest and rib pain, completed full course of antibiotics.  Has history of PE but did not refill his Eliquis , did agree to resume Eliquis  at discharge.  Has history of Hodgkin's lymphoma in remission, does have evidence of anterior mediastinal mass that was reportedly stable.     Physical Exam   Triage Vital Signs: ED Triage Vitals  Encounter Vitals Group     BP 06/17/24 1850 124/86     Girls Systolic BP Percentile --      Girls Diastolic BP Percentile --      Boys Systolic BP Percentile --      Boys Diastolic BP Percentile --      Pulse Rate 06/17/24 1850 69     Resp 06/17/24 1850 18     Temp 06/17/24 1850 97.9 F (36.6 C)     Temp Source 06/17/24 1850 Oral     SpO2 06/17/24 1850 99 %     Weight --      Height --      Head Circumference --      Peak Flow --      Pain Score 06/17/24 2338 6     Pain Loc --      Pain Education --      Exclude from Growth Chart --     Most recent vital signs: Vitals:   06/17/24 2330 06/17/24 2335  BP: 133/89   Pulse: 67   Resp: 14   Temp:  98 F (36.7 C)  SpO2: 99%      General: Awake, no distress. *** CV:  Good peripheral perfusion. *** Resp:  Normal effort. *** Abd:  No distention. *** Other:  ***   ED Results / Procedures / Treatments   Labs (all labs ordered are listed, but only abnormal results are displayed) Labs Reviewed  BASIC METABOLIC PANEL WITH GFR  - Abnormal; Notable for the following components:      Result Value   Potassium 3.2 (*)    All other components within normal limits  CBC - Abnormal; Notable for the following components:   Hemoglobin 11.8 (*)    HCT 34.5 (*)    MCV 75.7 (*)    MCH 25.9 (*)    RDW 17.5 (*)    All other components within normal limits  TROPONIN I (HIGH SENSITIVITY)  TROPONIN I (HIGH SENSITIVITY)     EKG  EKG shows, sinus rhythm, sinus arrhythmia, rate 34, normal QS, normal QTc, no obvious ischemic ST elevation, T wave inversion to aVL, T wave changes to compare to prior   RADIOLOGY On my independent interpretation, ***   PROCEDURES:  Critical Care performed: {CriticalCareYesNo:19197::Yes, see critical care procedure note(s),No}  Procedures   MEDICATIONS ORDERED IN ED: Medications - No data to display   IMPRESSION / MDM / ASSESSMENT AND PLAN / ED COURSE  I reviewed the triage vital signs and the nursing notes.  Differential diagnosis includes, but is not limited to, ***  Patient's presentation is most consistent with {EM COPA:27473}  Independent interpretation of labs and imaging below. ***  The patient is on the cardiac monitor to evaluate for evidence of arrhythmia and/or significant heart rate changes.       FINAL CLINICAL IMPRESSION(S) / ED DIAGNOSES   Final diagnoses:  None     Rx / DC Orders   ED Discharge Orders     None        Note:  This document was prepared using Dragon voice recognition software and may include unintentional dictation errors.

## 2024-06-17 NOTE — ED Triage Notes (Signed)
 Patient states left sided chest pain and low back pain for 3-4 days; recently finished antibiotics for Pneumonia.

## 2024-06-18 LAB — RESP PANEL BY RT-PCR (RSV, FLU A&B, COVID)  RVPGX2
Influenza A by PCR: NEGATIVE
Influenza B by PCR: NEGATIVE
Resp Syncytial Virus by PCR: NEGATIVE
SARS Coronavirus 2 by RT PCR: NEGATIVE

## 2024-06-18 MED ORDER — OXYCODONE HCL 5 MG PO TABS
5.0000 mg | ORAL_TABLET | Freq: Once | ORAL | Status: AC
  Administered 2024-06-18: 5 mg via ORAL
  Filled 2024-06-18: qty 1

## 2024-06-18 MED ORDER — APIXABAN 5 MG PO TABS: 5.0000 mg | ORAL_TABLET | Freq: Two times a day (BID) | ORAL | 0 refills | Status: DC

## 2024-06-18 MED ORDER — LIDOCAINE 5 % EX PTCH
1.0000 | MEDICATED_PATCH | CUTANEOUS | Status: DC
  Administered 2024-06-18: 1 via TRANSDERMAL
  Filled 2024-06-18: qty 1

## 2024-06-18 MED ORDER — LIDOCAINE 5 % EX PTCH: 1.0000 | MEDICATED_PATCH | CUTANEOUS | 0 refills | Status: AC

## 2024-06-18 MED ORDER — ACETAMINOPHEN 500 MG PO TABS
1000.0000 mg | ORAL_TABLET | Freq: Once | ORAL | Status: AC
  Administered 2024-06-18: 1000 mg via ORAL
  Filled 2024-06-18: qty 2

## 2024-06-18 MED ORDER — KETOROLAC TROMETHAMINE 30 MG/ML IJ SOLN
15.0000 mg | Freq: Once | INTRAMUSCULAR | Status: AC
  Administered 2024-06-18: 15 mg via INTRAVENOUS
  Filled 2024-06-18: qty 1

## 2024-06-18 NOTE — Discharge Instructions (Addendum)
 Please take your medications as prescribed.  Please make sure to follow-up with your primary care doctor to get reassessed next week.

## 2024-06-22 ENCOUNTER — Ambulatory Visit: Admitting: Oncology

## 2024-06-22 ENCOUNTER — Inpatient Hospital Stay

## 2024-08-09 ENCOUNTER — Other Ambulatory Visit: Payer: Self-pay

## 2024-08-09 DIAGNOSIS — D500 Iron deficiency anemia secondary to blood loss (chronic): Secondary | ICD-10-CM

## 2024-08-10 ENCOUNTER — Telehealth: Payer: Self-pay | Admitting: Oncology

## 2024-08-10 ENCOUNTER — Inpatient Hospital Stay: Attending: Oncology

## 2024-08-10 ENCOUNTER — Inpatient Hospital Stay: Admitting: Oncology

## 2024-08-10 ENCOUNTER — Encounter: Payer: Self-pay | Admitting: Oncology

## 2024-08-10 VITALS — BP 124/81 | HR 66 | Temp 97.9°F | Resp 18 | Wt 160.2 lb

## 2024-08-10 DIAGNOSIS — D500 Iron deficiency anemia secondary to blood loss (chronic): Secondary | ICD-10-CM

## 2024-08-10 DIAGNOSIS — K921 Melena: Secondary | ICD-10-CM | POA: Insufficient documentation

## 2024-08-10 DIAGNOSIS — F17210 Nicotine dependence, cigarettes, uncomplicated: Secondary | ICD-10-CM | POA: Diagnosis not present

## 2024-08-10 DIAGNOSIS — Z86711 Personal history of pulmonary embolism: Secondary | ICD-10-CM | POA: Insufficient documentation

## 2024-08-10 DIAGNOSIS — Z8572 Personal history of non-Hodgkin lymphomas: Secondary | ICD-10-CM | POA: Diagnosis present

## 2024-08-10 DIAGNOSIS — Z95828 Presence of other vascular implants and grafts: Secondary | ICD-10-CM | POA: Diagnosis not present

## 2024-08-10 DIAGNOSIS — C8170 Other classical Hodgkin lymphoma, unspecified site: Secondary | ICD-10-CM | POA: Diagnosis not present

## 2024-08-10 DIAGNOSIS — Z7901 Long term (current) use of anticoagulants: Secondary | ICD-10-CM | POA: Diagnosis not present

## 2024-08-10 LAB — CMP (CANCER CENTER ONLY)
ALT: <5 U/L (ref 0–44)
AST: 21 U/L (ref 15–41)
Albumin: 4.6 g/dL (ref 3.5–5.0)
Alkaline Phosphatase: 55 U/L (ref 38–126)
Anion gap: 12 (ref 5–15)
BUN: 11 mg/dL (ref 6–20)
CO2: 23 mmol/L (ref 22–32)
Calcium: 9.3 mg/dL (ref 8.9–10.3)
Chloride: 102 mmol/L (ref 98–111)
Creatinine: 0.77 mg/dL (ref 0.61–1.24)
GFR, Estimated: >60 mL/min
Glucose, Bld: 92 mg/dL (ref 70–99)
Potassium: 3.9 mmol/L (ref 3.5–5.1)
Sodium: 138 mmol/L (ref 135–145)
Total Bilirubin: 0.9 mg/dL (ref 0.0–1.2)
Total Protein: 7.7 g/dL (ref 6.5–8.1)

## 2024-08-10 LAB — CBC WITH DIFFERENTIAL (CANCER CENTER ONLY)
Abs Immature Granulocytes: 0.04 K/uL (ref 0.00–0.07)
Basophils Absolute: 0 K/uL (ref 0.0–0.1)
Basophils Relative: 0 %
Eosinophils Absolute: 0 K/uL (ref 0.0–0.5)
Eosinophils Relative: 0 %
HCT: 32.7 % — ABNORMAL LOW (ref 39.0–52.0)
Hemoglobin: 11.7 g/dL — ABNORMAL LOW (ref 13.0–17.0)
Immature Granulocytes: 1 %
Lymphocytes Relative: 13 %
Lymphs Abs: 1.1 K/uL (ref 0.7–4.0)
MCH: 26.4 pg (ref 26.0–34.0)
MCHC: 35.8 g/dL (ref 30.0–36.0)
MCV: 73.6 fL — ABNORMAL LOW (ref 80.0–100.0)
Monocytes Absolute: 1 K/uL (ref 0.1–1.0)
Monocytes Relative: 11 %
Neutro Abs: 6.3 K/uL (ref 1.7–7.7)
Neutrophils Relative %: 75 %
Platelet Count: 261 K/uL (ref 150–400)
RBC: 4.44 MIL/uL (ref 4.22–5.81)
RDW: 18.6 % — ABNORMAL HIGH (ref 11.5–15.5)
WBC Count: 8.4 K/uL (ref 4.0–10.5)
nRBC: 0 % (ref 0.0–0.2)

## 2024-08-10 LAB — LACTATE DEHYDROGENASE: LDH: 119 U/L (ref 105–235)

## 2024-08-10 MED ORDER — APIXABAN 2.5 MG PO TABS: 2.5000 mg | ORAL_TABLET | Freq: Two times a day (BID) | ORAL | 0 refills | Status: AC

## 2024-08-10 MED ORDER — GABAPENTIN 600 MG PO TABS: 600.0000 mg | ORAL_TABLET | Freq: Three times a day (TID) | ORAL | 0 refills | Status: AC

## 2024-08-10 NOTE — Assessment & Plan Note (Signed)
 left antecubital fossa port, he has not flushed as instructed.  I previous discussed with vascular surgeon Dr.Dew.  Plan is to remove this port as it is associated with high risk of occlusion/thrombosis. However patient did not follow up with vascular surgery.  Patient is interested in removing Mediport.  I will refer him back to vascular surgeon.

## 2024-08-10 NOTE — Assessment & Plan Note (Signed)
 Refer to GI, he did not establish care. Currently he is interested, will refer again.

## 2024-08-10 NOTE — Assessment & Plan Note (Signed)
#   Hodgkin's lymphoma, classic type S/p 2 cycles of ABVD and 4 cycles of AVD.  July 2025 PET scan restaging showed no evidence of disease recurrence.  Patient has chronic mediastinal mass without any hypermetabolic activity, consistent with treated disease.  Continue clinical surveillance.

## 2024-08-10 NOTE — Assessment & Plan Note (Signed)
 Unprovoked event. Patient has finished about 6 months of anticoagulation with Eliquis  5 mg twice daily. I recommend Eliquis  2.5 mg twice daily for anticoagulation prophylaxis.  Prescription sent to pharmacy.

## 2024-08-10 NOTE — Telephone Encounter (Signed)
 Pt called to let us  know that he would be 10-15 mins late for his appts today - let the team know - LH

## 2024-08-10 NOTE — Progress Notes (Signed)
 " Hematology/Oncology Progress note [phone] [phone]     CHIEF COMPLAINTS/REASON FOR VISIT:  Follow up for hodgkin's lymphoma, left side numbness   ASSESSMENT & PLAN:   Malignant lymphoma, Hodgkin's type (HCC) # Hodgkin's lymphoma, classic type S/p 2 cycles of ABVD and 4 cycles of AVD.  July 2025 PET scan restaging showed no evidence of disease recurrence.  Patient has chronic mediastinal mass without any hypermetabolic activity, consistent with treated disease.  Continue clinical surveillance.  History of pulmonary embolism Unprovoked event. Patient has finished about 6 months of anticoagulation with Eliquis  5 mg twice daily. I recommend Eliquis  2.5 mg twice daily for anticoagulation prophylaxis.  Prescription sent to pharmacy.  Port-A-Cath in place left antecubital fossa port, he has not flushed as instructed.  I previous discussed with vascular surgeon Dr.Dew.  Plan is to remove this port as it is associated with high risk of occlusion/thrombosis. However patient did not follow up with vascular surgery.  Patient is interested in removing Mediport.  I will refer him back to vascular surgeon.  Blood in stool Refer to GI, he did not establish care. Currently he is interested, will refer again.     Orders Placed This Encounter  Procedures   CBC with Differential (Cancer Center Only)    Standing Status:   Future    Expected Date:   01/08/2025    Expiration Date:   04/08/2025   CMP (Cancer Center only)    Standing Status:   Future    Expected Date:   01/08/2025    Expiration Date:   04/08/2025   Lactate dehydrogenase    Standing Status:   Future    Expected Date:   01/08/2025    Expiration Date:   04/08/2025   Ambulatory referral to Vascular Surgery    Referral Priority:   Routine    Referral Type:   Surgical    Referral Reason:   Specialty Services Required    Requested Specialty:   Vascular Surgery    Number of Visits Requested:   1    Ambulatory referral to Gastroenterology    Referral Priority:   Routine    Referral Type:   Consultation    Referral Reason:   Specialty Services Required    Referred to Provider:   Melany Clotilda HERO, MD    Number of Visits Requested:   1    Follow up 6 months  All questions were answered. The patient knows to call the clinic with any problems, questions or concerns.  Preston Cap, MD, PhD [facility] Hematology Oncology 08/10/2024     HISTORY OF PRESENTING ILLNESS:   Preston [name] is a  38 y.o.  male with PMH listed below was seen in consultation at the request of  [name] Zelphia, MD  for evaluation of Hodgkin's lymphoma.  Patient has a known diagnosis of Hodgkin's lymphoma and wants to transfer his oncology care to our cancer center.  Extensive medical records reviewed was performed  Previous oncology care was [facility] Per note  10/28/2019 biopsy of right chest wall mass, fibromuscular soft tissue with atypical infiltrate consistent with Hodgkin lymphoma, classical type. There were scattered single atypical cells with morphology suggestive of Preston [name] variant cells. These cells were reactive with CD 45, CD 15, CD 30 and nonreactive for pan keratin, cam 5.7, CD3, CD5, CD10, CD 20, melan-A, Sox 10. Ki-67 stained majority of Reed-Sternberg like cells.  Noticed right upper chest wall lump in January 2021. He  was incarcerated for 27 months, released in May 2021.  Unintentional weight loss about 30 pounds, drenching night sweats.  01/17/2020 PET scan showed bulky anterior mediastinal mass 12.7cm. with extensive adenopathy in neck, right axilla, mediastinum, bilateral hilar regions, upper abdominal ligament adenopathy and retroperitoneal adenopathy. Abnormal hypermetabolic activity within a splenic focus. Mild patchy ground glass opacity in the right lower lobe may reflect postobstructive inflammatory change without hypermetabolic activity.   01/17/2020 Echo 1. Normal  biventricular systolic function and size. LVEF 60%. Average global longitudinal strain is normal, -20%   2. No significant valvular dysfunction.   3. Normal right-and left-sided filling pressures.  4. No prior study for comparison.  12/30/2019 LDH 588 01/24/2020 CRP 67.2, ESR 60  Stage IIIB Hodgkin's lymphoma, he was recommended ABVD x 2 cycles, followed by PET with plan of AVD x 4 or escalate to BEACOPP if not favorable results.   01/26/2020 ABVD Day 1,15 x 2 cycles.   03/24/2020 PET scan showed significant interval response to therapy. All PET positive lesions previously resolved 03/30/2020 AVD D1 04/13/2020 AVD D15  # He was not able to have chest medi port placed due to chest mass. He has central line placed on left upper extremity. #Central line access.  Patient has seen Dr. Marea for evaluation of the left upper extremity central line.  I discussed with Dr. Marea and tip is in good position in the SVC.  Okay to use from vascular surgeon's aspect.  [facility] pathology consultation on biopsy specimen from 10/28/2019 showed findings compatible with classic Hodgkin's lymphoma.  # 06/26/2020 - 11/29/20201 - Cycle 4 AVD  He no showed for his chemotherapy appointment in mid December 2021.    08/07/2020 seen at that time he reports acute onset of chest pain, left shoulder, back and left arm. He was sent to emergency room to have a CT scan done to rule out acute pulmonary embolism 08/07/2020, CT chest angiogram showed no pulmonary emboli or acute chest vascular pathology. Superior mediastinal lymphadenopathy consistent with clinical history of lymphoma.  Largest node measures 8.3 x 8.3 x 3.8 cm.  Several other smaller but pathologic lymph nodes in the anterior mediastinum, second largest at the aortopulmonary window measuring 1.3 x 1.9 x 3.1 cm.  No pulmonary mass or nodule.  Patient also had negative troponin,Patient was discharged home for further follow-up with cancer center.  ER physician provided a  prescription of Percocet 5/325 mg 1 tablet every 4 hours as needed for severe pain.  I obtained additional work-up for work-up to see if there is lymphoma progression.  08/23/2020 PET scan showed dominant right paramidline anterior mediastinal mass lesion is stable in size comparing to her outside PET scan on 03/24/2020.  Lesion showed low-level FDG uptake Douville 3, not appreciably changed in the interval.  Mildly enlarged hepatoduodenal ligament lymph node measures minimally smaller on CT imaging today with Douville 2 uptake.  No additional area of unexpected or suspicious hypermetabolic zone.  No other findings of lymphadenopathy on noncontrast CT image today.  Patient was recommended to proceed with 2D echocardiogram and he had Echo done on 09/08/2020. The delay was due to him not showing up to appointment.  #09/08/2020  2D echocardiogram showed low normal LVEF 50% to 55%.  This is slightly decreased from his outside echocardiogram in June 2021 with LVEF of 60%.  09/01/2020- 09/15/2020 Cycle 5 AVD 2/222022 Cycle 6 D1 AVD  Neck shoulder pain worsen.   #10/18/2020  MRI cervical spine showed bulky degenerative disc extrusion  at C5-C6 with severe spinal stenosis, spinal cord mass-effect and abnormal cord signal (edema and/or myelomalacia). Moderate to severe multifactorial right C6 foraminal stenosis. Smaller disc herniation at C3-C4, and lobulated disc bulging at C4-C5 with up to mild spinal stenosis at those levels. Up to moderate degenerative right C4, left C7, and moderate to severe bilateral C8 neural foraminal stenosis. 10/20/20 Patient underwent  C5-6 anterior cervical discectomy and fusion    11/10/2020 he was seen in the clinic prior to chemotherapy. At that time, he felt weak and has had weight loss due to his neck surgery. Therefore decision was made to hold chemo and give him 1-2 weeks for recovery.  Then he had multiple no show for chemotherapy treatment appointment.   # 5/26/.2022 cycle 6 D15  AVD.  #09/10/2021 CT chest abdomen pelvis w contrast showed stable mediastinal adenopathy, no progressive findings.  Stable low-attenuation celiac axis node.  No mesenteric or retroperitoneal or pelvic adenopathy.  Stable diffuse fatty infiltration of the liver.  Stable renal cyst.  Age advanced coronary artery calcifications.  # 03/12/22 CT chest abdomen pelvis w contrast showed 1. Slight interval increase in size of the dominant anterior mediastinal nodal tissue. 2. Additional anterior mediastinal lymph nodes and the low-density celiac axis lymph node are stable. 3. No new areas of adenopathy in the chest, abdomen or pelvis.  09/23/2022 CT chest abdomen pelvis w contrast showed 1. Decreased size of the dominant anterior mediastinal mass/lymph node conglomerate and adjacent mediastinal lymph nodes. 2. Stable low-attenuation celiac axis lymph nodes. 3. No new or progressive adenopathy in the chest, abdomen or pelvis and no splenomegaly. 4.  Aortic Atherosclerosis  01/21/2023 CT chest w contrast  Stable mediastinal mass and other mediastinal lymph nodes. No new mass lesion, fluid collection or lymph node enlargement in the thorax.Fatty liver infiltration.Coronary artery calcifications. Please correlate for other coronary risk factors.   04/12/2023 CT chest abdomen pelvis showed 1. Increased size of the dominant anterior mediastinal mass and mediastinal lymph nodes, compatible with progressive lymphomatous disease 2. No lymphadenopathy below the diaphragm and no splenomegaly. 3.  Aortic Atherosclerosis (ICD10-I70.0)  02/02/2024 - 02/03/2024, patient was hospitalized due to lightheaded, dizziness, status post fall. 02/02/2024, CTA chest showed acute pulm embolism with a distal segmental and subsegmental pulmonary arteries to the posterior intermediate left lower lobe.  No signs of right heart strain. Anterior mediastinal mass and adenopathy are similar in appearance to previous examination.  Aortic  atherosclerosis.  INTERVAL HISTORY Maccoy Haubner is a 38 y.o. male who has above history reviewed by me today presents for follow up visit for management of Hodgkin's  Lymphoma Patient is not compliant with his follow-up and no-show to his follow-up appointment..  Discussed the use of AI scribe software for clinical note transcription with the patient, who gave verbal consent to proceed.   He denies B symptoms and new lymphadenopathy.  03/04/2024, PET scan restaging showed No specific areas of abnormal radiotracer uptake at this time. Stable anterior mediastinal heterogeneous lesion with calcifications. Deauville category 1.  No splenomegaly.  Scattered vascular calcifications.  He experiences  chronic neck pain following prior neck surgery, described as persistent and severe. He is managed with gabapentin , recently increased to 600 mg three times daily, and denies drowsiness or other side effects. He is not taking other analgesics.  Gabapentin  was managed by primary care provider.  He has an appointment with PCP around August 26, 2024.  He request refill of gabapentin  until his appointment.  He has a history  of unprovoked pulmonary embolism and completed approximately six months of therapeutic anticoagulation with apixaban . He has been off anticoagulation for two to two and a half weeks due to running out of medication. He describes mild, intermittent shortness of breath, stable for two to three months, with no acute changes. Recent CT chest angiogram showed no new pulmonary embolism but did reveal pneumonia and possible multifocal pneumonitis, for which he was treated. He has had no new or acute symptoms since treatment for pneumonia.  He intermittently experiences hematochezia and has established with gastroenterology. He has not undergone colonoscopy. He reports good oral intake, no weight loss, and recent weight gain to 160 lbs. He has ongoing difficulty sleeping.   Review of Systems   Constitutional:  Positive for appetite change, fatigue and unexpected weight change. Negative for chills and fever.  HENT:   Negative for hearing loss and voice change.   Eyes:  Negative for eye problems and icterus.  Respiratory:  Negative for chest tightness, cough and shortness of breath.   Cardiovascular:  Negative for chest pain and leg swelling.  Gastrointestinal:  Negative for abdominal distention, abdominal pain and nausea.  Endocrine: Negative for hot flashes.  Genitourinary:  Negative for difficulty urinating, dysuria and frequency.   Musculoskeletal:  Positive for back pain and neck pain. Negative for arthralgias.  Skin:  Negative for itching and rash.  Neurological:  Positive for numbness. Negative for light-headedness.  Hematological:  Negative for adenopathy. Does not bruise/bleed easily.  Psychiatric/Behavioral:  Negative for confusion.     MEDICAL HISTORY:  Past Medical History:  Diagnosis Date   Alcohol abuse    Chronic low back pain    History of pulmonary embolus (PE)    Hodgkin's lymphoma (HCC)    Stenosis of cervical spine    Left   Tobacco use     SURGICAL HISTORY: Past Surgical History:  Procedure Laterality Date   ANTERIOR CERVICAL DECOMP/DISCECTOMY FUSION N/A 10/20/2020   Procedure: ANTERIOR CERVICAL DECOMPRESSION/DISCECTOMY FUSION 1 LEVEL C5-C6;  Surgeon: Clois Fret, MD;  Location: [facility] ORS;  Service: Neurosurgery;  Laterality: N/A;    SOCIAL HISTORY: Social History   Socioeconomic History   Marital status: Married    Spouse name: Not on file   Number of children: Not on file   Years of education: Not on file   Highest education level: Not on file  Occupational History   Not on file  Tobacco Use   Smoking status: Every Day    Current packs/day: 0.50    Average packs/day: 0.5 packs/day for 20.0 years (10.0 ttl pk-yrs)    Types: Cigarettes   Smokeless tobacco: Never  Vaping Use   Vaping status: Never Used  Substance and Sexual  Activity   Alcohol use: Yes    Alcohol/week: 3.0 standard drinks of alcohol    Types: 3 Cans of beer per week    Comment: daily   Drug use: Not Currently   Sexual activity: Yes    Partners: Female  Other Topics Concern   Not on file  Social History Narrative   Not on file   Social Drivers of Health   Tobacco Use: High Risk (08/10/2024)   Patient History    Smoking Tobacco Use: Every Day    Smokeless Tobacco Use: Never    Passive Exposure: Not on file  Financial Resource Strain: Not on file  Food Insecurity: No Food Insecurity (05/23/2024)   Epic    Worried About Radiation Protection Practitioner of Food in the Last  Year: Never true    Ran Out of Food in the Last Year: Never true  Transportation Needs: Unmet Transportation Needs (05/23/2024)   Epic    Lack of Transportation (Medical): Yes    Lack of Transportation (Non-Medical): Yes  Physical Activity: Not on file  Stress: Not on file  Social Connections: Unknown (10/08/2022)   [facility]   Social Network    Social Network: Not on file  Intimate Partner Violence: Not At Risk (05/23/2024)   Epic    Fear of Current or Ex-Partner: No    Emotionally Abused: No    Physically Abused: No    Sexually Abused: No  Depression (PHQ2-9): Not on file  Alcohol Screen: Not on file  Housing: Low Risk (05/23/2024)   Epic    Unable to Pay for Housing in the Last Year: No    Number of Times Moved in the Last Year: 0    Homeless in the Last Year: No  Utilities: Not At Risk (05/23/2024)   Epic    Threatened with loss of utilities: No  Health Literacy: Not on file    FAMILY HISTORY: Family History  Problem Relation Age of Onset   Heart failure Mother    Pneumonia Father    Alcohol abuse Brother     ALLERGIES:  has no known allergies.  MEDICATIONS:  Current Outpatient Medications  Medication Sig Dispense Refill   acetaminophen  (TYLENOL ) 500 MG tablet Take 1,000 mg by mouth every 6 (six) hours as needed for mild pain (pain score  1-3) or moderate pain (pain score 4-6).     apixaban  (ELIQUIS ) 2.5 MG TABS tablet Take 1 tablet (2.5 mg total) by mouth 2 (two) times daily. 180 tablet 0   gabapentin  (NEURONTIN ) 600 MG tablet Take 1 tablet (600 mg total) by mouth 3 (three) times daily. 60 tablet 0   No current facility-administered medications for this visit.   Facility-Administered Medications Ordered in Other Visits  Medication Dose Route Frequency Provider Last Rate Last Admin   heparin  lock flush 100 unit/mL  500 Units Intravenous Once Babara Call, MD       sodium chloride  flush (NS) 0.9 % injection 10 mL  10 mL Intravenous PRN Babara Call, MD   10 mL at 08/07/20 0826     PHYSICAL EXAMINATION: ECOG PERFORMANCE STATUS: 0 - Asymptomatic Vitals:   08/10/24 1441  BP: 124/81  Pulse: 66  Resp: 18  Temp: 97.9 F (36.6 C)  SpO2: 99%   Filed Weights   08/10/24 1441  Weight: 160 lb 3.2 oz (72.7 kg)    Physical Exam Constitutional:      General: He is not in acute distress. HENT:     Head: Normocephalic and atraumatic.  Eyes:     General: No scleral icterus. Cardiovascular:     Rate and Rhythm: Normal rate and regular rhythm.     Heart sounds: Normal heart sounds.  Pulmonary:     Effort: Pulmonary effort is normal. No respiratory distress.     Breath sounds: No wheezing.  Abdominal:     General: Bowel sounds are normal. There is no distension.     Palpations: Abdomen is soft.  Musculoskeletal:        General: No deformity. Normal range of motion.     Cervical back: Normal range of motion and neck supple.  Lymphadenopathy:     Cervical: No cervical adenopathy.  Skin:    General: Skin is warm and dry.  Findings: No erythema or rash.  Neurological:     Mental Status: He is alert and oriented to person, place, and time. Mental status is at baseline.     Cranial Nerves: No cranial nerve deficit.     Coordination: Coordination normal.  Psychiatric:        Mood and Affect: Mood normal.   left upper arm  central line port.      LABORATORY DATA:  I have reviewed the data as listed    Latest Ref Rng & Units 08/10/2024    2:44 PM 06/17/2024    6:51 PM 05/25/2024    4:05 AM  CBC  WBC 4.0 - 10.5 K/uL 8.4  5.5  5.6   Hemoglobin 13.0 - 17.0 g/dL 88.2  88.1  87.8   Hematocrit 39.0 - 52.0 % 32.7  34.5  34.0   Platelets 150 - 400 K/uL 261  270  305       Latest Ref Rng & Units 08/10/2024    2:44 PM 06/17/2024    6:51 PM 05/23/2024    3:10 AM  CMP  Glucose 70 - 99 mg/dL 92  74  81   BUN 6 - 20 mg/dL 11  9  7    Creatinine 0.61 - 1.24 mg/dL 9.22  9.34  9.28   Sodium 135 - 145 mmol/L 138  138  141   Potassium 3.5 - 5.1 mmol/L 3.9  3.2  3.6   Chloride 98 - 111 mmol/L 102  102  102   CO2 22 - 32 mmol/L 23  22  29    Calcium 8.9 - 10.3 mg/dL 9.3  9.1  9.1   Total Protein 6.5 - 8.1 g/dL 7.7   8.1   Total Bilirubin 0.0 - 1.2 mg/dL 0.9   0.6   Alkaline Phos 38 - 126 U/L 55   42   AST 15 - 41 U/L 21   26   ALT 0 - 44 U/L <5   17      RADIOGRAPHIC STUDIES: I have personally reviewed the radiological images as listed and agreed with the findings in the report. No results found.   "

## 2024-09-03 ENCOUNTER — Telehealth (INDEPENDENT_AMBULATORY_CARE_PROVIDER_SITE_OTHER): Payer: Self-pay

## 2024-09-03 NOTE — Telephone Encounter (Signed)
 Spoke with the patient and he is scheduled with Dr. Marea for a port removal on 09/13/24 with a 10:00 am arrival time to the [facility]. Pre-procedure instructions were discussed and will be mailed.

## 2024-09-13 ENCOUNTER — Ambulatory Visit: Admission: RE | Admit: 2024-09-13 | Admitting: Vascular Surgery

## 2024-09-13 DIAGNOSIS — C8170 Other classical Hodgkin lymphoma, unspecified site: Secondary | ICD-10-CM

## 2024-09-15 ENCOUNTER — Telehealth (INDEPENDENT_AMBULATORY_CARE_PROVIDER_SITE_OTHER): Payer: Self-pay

## 2024-09-15 NOTE — Telephone Encounter (Signed)
 Spoke with the patient and he has been rescheduled to 09/27/24 with a 11:00 am arrival time to the [facility]. Pre-procedure instructions will be mailed.

## 2024-09-16 DIAGNOSIS — C8170 Other classical Hodgkin lymphoma, unspecified site: Secondary | ICD-10-CM

## 2024-09-27 ENCOUNTER — Encounter: Admission: RE | Payer: Self-pay | Source: Home / Self Care

## 2024-09-27 DIAGNOSIS — C8170 Other classical Hodgkin lymphoma, unspecified site: Secondary | ICD-10-CM

## 2024-10-28 ENCOUNTER — Ambulatory Visit: Admitting: Gastroenterology

## 2025-01-06 ENCOUNTER — Inpatient Hospital Stay: Admitting: Oncology

## 2025-01-06 ENCOUNTER — Inpatient Hospital Stay
